# Patient Record
Sex: Female | Born: 1950
Health system: Southern US, Community
[De-identification: ages and names within clinical notes are randomized; demographics above are authoritative.]

## PROBLEM LIST (undated history)

## (undated) DIAGNOSIS — I350 Nonrheumatic aortic (valve) stenosis: Secondary | ICD-10-CM

## (undated) DIAGNOSIS — I10 Essential (primary) hypertension: Secondary | ICD-10-CM

## (undated) DIAGNOSIS — C50919 Malignant neoplasm of unspecified site of unspecified female breast: Secondary | ICD-10-CM

## (undated) DIAGNOSIS — Z9221 Personal history of antineoplastic chemotherapy: Secondary | ICD-10-CM

## (undated) DIAGNOSIS — C801 Malignant (primary) neoplasm, unspecified: Secondary | ICD-10-CM

## (undated) DIAGNOSIS — Z923 Personal history of irradiation: Secondary | ICD-10-CM

## (undated) DIAGNOSIS — T7840XA Allergy, unspecified, initial encounter: Secondary | ICD-10-CM

## (undated) DIAGNOSIS — E785 Hyperlipidemia, unspecified: Secondary | ICD-10-CM

## (undated) HISTORY — DX: Hyperlipidemia, unspecified: E78.5

## (undated) HISTORY — DX: Allergy, unspecified, initial encounter: T78.40XA

## (undated) HISTORY — DX: Essential (primary) hypertension: I10

## (undated) HISTORY — PX: ABDOMINAL HYSTERECTOMY: SHX81

## (undated) HISTORY — DX: Nonrheumatic aortic (valve) stenosis: I35.0

## (undated) HISTORY — DX: Malignant (primary) neoplasm, unspecified: C80.1

## (undated) HISTORY — PX: CARDIAC CATHETERIZATION: SHX172

---

## 1998-04-20 DIAGNOSIS — Z9221 Personal history of antineoplastic chemotherapy: Secondary | ICD-10-CM

## 1998-04-20 DIAGNOSIS — Z923 Personal history of irradiation: Secondary | ICD-10-CM

## 1998-04-20 HISTORY — PX: BREAST LUMPECTOMY: SHX2

## 1998-04-20 HISTORY — DX: Personal history of antineoplastic chemotherapy: Z92.21

## 1998-04-20 HISTORY — DX: Personal history of irradiation: Z92.3

## 2008-04-20 LAB — CONVERTED CEMR LAB: Pap Smear: NORMAL

## 2009-01-27 LAB — HM MAMMOGRAPHY: HM Mammogram: NORMAL

## 2009-05-16 ENCOUNTER — Encounter: Admission: RE | Admit: 2009-05-16 | Discharge: 2009-05-16 | Payer: Self-pay | Admitting: Surgery

## 2009-12-06 ENCOUNTER — Telehealth (INDEPENDENT_AMBULATORY_CARE_PROVIDER_SITE_OTHER): Payer: Self-pay | Admitting: *Deleted

## 2009-12-16 ENCOUNTER — Ambulatory Visit: Payer: Self-pay | Admitting: Internal Medicine

## 2009-12-16 DIAGNOSIS — J309 Allergic rhinitis, unspecified: Secondary | ICD-10-CM | POA: Insufficient documentation

## 2009-12-16 DIAGNOSIS — I10 Essential (primary) hypertension: Secondary | ICD-10-CM | POA: Insufficient documentation

## 2009-12-16 DIAGNOSIS — R748 Abnormal levels of other serum enzymes: Secondary | ICD-10-CM

## 2009-12-16 LAB — CONVERTED CEMR LAB
ALT: 20 units/L (ref 0–35)
AST: 25 units/L (ref 0–37)
Alkaline Phosphatase: 78 units/L (ref 39–117)
BUN: 15 mg/dL (ref 6–23)
Bilirubin, Direct: 0.1 mg/dL (ref 0.0–0.3)
Calcium: 9.7 mg/dL (ref 8.4–10.5)
Cholesterol: 220 mg/dL — ABNORMAL HIGH (ref 0–200)
Creatinine, Ser: 0.7 mg/dL (ref 0.4–1.2)
Direct LDL: 95.1 mg/dL
Eosinophils Relative: 2.1 % (ref 0.0–5.0)
GFR calc non Af Amer: 90.55 mL/min (ref >60)
HDL: 103.6 mg/dL (ref >39.00)
Lymphocytes Relative: 30 % (ref 12.0–46.0)
Monocytes Absolute: 0.5 10*3/uL (ref 0.1–1.0)
Monocytes Relative: 10.2 % (ref 3.0–12.0)
Neutrophils Relative %: 56.9 % (ref 43.0–77.0)
Platelets: 213 10*3/uL (ref 150.0–400.0)
Total Bilirubin: 0.6 mg/dL (ref 0.3–1.2)
Total CHOL/HDL Ratio: 2
VLDL: 10.2 mg/dL (ref 0.0–40.0)
WBC: 5.3 10*3/uL (ref 4.5–10.5)

## 2010-04-17 ENCOUNTER — Ambulatory Visit: Payer: Self-pay | Admitting: Internal Medicine

## 2010-05-12 ENCOUNTER — Other Ambulatory Visit: Payer: Self-pay | Admitting: Internal Medicine

## 2010-05-12 DIAGNOSIS — Z1239 Encounter for other screening for malignant neoplasm of breast: Secondary | ICD-10-CM

## 2010-05-20 NOTE — Assessment & Plan Note (Signed)
Summary: new / bcbs / # cd   Vital Signs:  Patient profile:   60 year old female Menstrual status:  hysterectomy Height:      66 inches Weight:      138.25 pounds BMI:     22.39 O2 Sat:      96 % on Room air Temp:     98.4 degrees F oral Pulse rate:   68 / minute Pulse rhythm:   regular Resp:     16 per minute BP sitting:   102 / 62  (left arm) Cuff size:   large  Vitals Entered By: Rock Nephew CMA (December 16, 2009 10:46 AM)  O2 Flow:  Room air CC: New to establish, Lipid Management Is Patient Diabetic? No Pain Assessment Patient in pain? no       Does patient need assistance? Functional Status Self care Ambulation Normal     Menstrual Status hysterectomy Last PAP Result normal   Primary Care Provider:  Etta Grandchild MD  CC:  New to establish and Lipid Management.  History of Present Illness:  Hypertension Follow-Up      This is a 60 year old woman who presents for Hypertension follow-up.  The patient reports lightheadedness, but denies urinary frequency, headaches, edema, rash, and fatigue.  The patient denies the following associated symptoms: chest pain, chest pressure, exercise intolerance, dyspnea, palpitations, syncope, leg edema, and pedal edema.  Compliance with medications (by patient report) has been near 100%.  The patient reports that dietary compliance has been excellent.  The patient reports exercising 3-4X per week.  Adjunctive measures currently used by the patient include salt restriction and relaxation.    Lipid Management History:      Positive NCEP/ATP III risk factors include female age 61 years old or older and hypertension.  Negative NCEP/ATP III risk factors include no history of early menopause without estrogen hormone replacement, non-diabetic, no family history for ischemic heart disease, non-tobacco-user status, no ASHD (atherosclerotic heart disease), no prior stroke/TIA, no peripheral vascular disease, and no history of aortic  aneurysm.        The patient states that she knows about the "Therapeutic Lifestyle Change" diet.  Her compliance with the TLC diet is good.  The patient expresses understanding of adjunctive measures for cholesterol lowering.  Adjunctive measures started by the patient include aerobic exercise, fiber, ASA, limit alcohol consumpton, and weight reduction.  She expresses no side effects from her lipid-lowering medication.  The patient denies any symptoms to suggest myopathy or liver disease.     Preventive Screening-Counseling & Management  Alcohol-Tobacco     Alcohol drinks/day: <1     Alcohol type: wine     >5/day in last 3 mos: no     Alcohol Counseling: not indicated; use of alcohol is not excessive or problematic     Feels need to cut down: no     Feels annoyed by complaints: no     Feels guilty re: drinking: no     Needs 'eye opener' in am: no     Smoking Status: never     Tobacco Counseling: not indicated; no tobacco use  Caffeine-Diet-Exercise     Does Patient Exercise: no     Exercise Counseling: to improve exercise regimen  Hep-HIV-STD-Contraception     Hepatitis Risk: no risk noted     HIV Risk: no risk noted     STD Risk: no risk noted     Dental Visit-last 6 months yes  SBE monthly: yes     SBE Education/Counseling: to perform regular SBE  Safety-Violence-Falls     Seat Belt Use: no      Sexual History:  not active.        Drug Use:  no.        Blood Transfusions:  no.    Medications Prior to Update: 1)  None  Current Medications (verified): 1)  Allegra 180 Mg Tabs (Fexofenadine Hcl) .... Take 1 Tablet By Mouth Once A Day 2)  Zolpidem Tartrate 5 Mg Tabs (Zolpidem Tartrate) .... Take 1 Tab By Mouth At Bedtime 3)  Simvastatin 80 Mg Tabs (Simvastatin) .... Take 1 Tablet By Mouth Once A Day 4)  Asa 81mg  .... Take 1 Tablet By Mouth Once A Day  Allergies (verified): 1)  ! Penicillin 2)  ! Sulfa  Comments:  Nurse/Medical Assistant: The patient's  medications and allergies were reviewed with the patient and were updated in the Medication and Allergy Lists. Rock Nephew CMA (December 16, 2009 10:49 AM)  Past History:  Past Medical History: Allergic rhinitis Breast cancer, hx of Hyperlipidemia Hypertension  Past Surgical History: Hysterectomy Lumpectomy  Family History: Family History of Arthritis Family History High cholesterol Family History Lung cancer Family History  Prostate Cancer Family History Breast Cancer  Social History: Occupation: Educational psychologist Divorced Never Smoked Alcohol use-yes Drug use-no Regular exercise-no Smoking Status:  never Drug Use:  no Does Patient Exercise:  no EducationRadiographer, therapeutic Use:  no Hepatitis Risk:  no risk noted HIV Risk:  no risk noted STD Risk:  no risk noted Dental Care w/in 6 mos.:  yes Sexual History:  not active Blood Transfusions:  no  Review of Systems  The patient denies anorexia, fever, weight loss, weight gain, hoarseness, chest pain, syncope, dyspnea on exertion, peripheral edema, prolonged cough, headaches, hemoptysis, abdominal pain, hematuria, suspicious skin lesions, difficulty walking, depression, enlarged lymph nodes, angioedema, and breast masses.    Physical Exam  General:  alert, well-developed, well-nourished, well-hydrated, appropriate dress, normal appearance, healthy-appearing, and cooperative to examination.   Head:  normocephalic, atraumatic, no abnormalities observed, and no abnormalities palpated.   Eyes:  vision grossly intact, pupils equal, pupils round, and pupils reactive to light.   Ears:  R ear normal and L ear normal.   Nose:  External nasal examination shows no deformity or inflammation. Nasal mucosa are pink and moist without lesions or exudates. Mouth:  Oral mucosa and oropharynx without lesions or exudates.  Teeth in good repair. Neck:  supple, full ROM, no masses, no thyromegaly, no thyroid nodules or  tenderness, no JVD, normal carotid upstroke, no carotid bruits, no cervical lymphadenopathy, and no neck tenderness.   Lungs:  normal respiratory effort, no intercostal retractions, no accessory muscle use, normal breath sounds, no dullness, no fremitus, no crackles, and no wheezes.   Heart:  normal rate, regular rhythm, no murmur, no gallop, no rub, no JVD, and no HJR.   Abdomen:  soft, non-tender, normal bowel sounds, no distention, no masses, no guarding, no rigidity, no rebound tenderness, no abdominal hernia, no inguinal hernia, no hepatomegaly, and no splenomegaly.   Msk:  normal ROM, no joint tenderness, no joint swelling, no joint warmth, no redness over joints, no joint deformities, no joint instability, and no crepitation.   Pulses:  R and L carotid,radial,femoral,dorsalis pedis and posterior tibial pulses are full and equal bilaterally Extremities:  No clubbing, cyanosis, edema, or deformity noted with normal full range of  motion of all joints.   Neurologic:  No cranial nerve deficits noted. Station and gait are normal. Plantar reflexes are down-going bilaterally. DTRs are symmetrical throughout. Sensory, motor and coordinative functions appear intact. Skin:  turgor normal, color normal, no rashes, no suspicious lesions, no ecchymoses, no petechiae, no purpura, no ulcerations, no edema, excessive tan, and solar damage.   Cervical Nodes:  no anterior cervical adenopathy and no posterior cervical adenopathy.   Axillary Nodes:  no R axillary adenopathy and no L axillary adenopathy.   Inguinal Nodes:  no R inguinal adenopathy and no L inguinal adenopathy.   Psych:  Cognition and judgment appear intact. Alert and cooperative with normal attention span and concentration. No apparent delusions, illusions, hallucinations   Impression & Recommendations:  Problem # 1:  HYPERTENSION (ICD-401.9) Assessment Improved  The following medications were removed from the medication list:    Lisinopril 10  Mg Tabs (Lisinopril) .Marland Kitchen... Take 1 tablet by mouth once a day  Orders: Venipuncture ([address]) TLB-Lipid Panel (80061-LIPID) TLB-BMP (Basic Metabolic Panel-BMET) (80048-METABOL) TLB-CBC Platelet - w/Differential (85025-CBCD) TLB-Hepatic/Liver Function Pnl (80076-HEPATIC) TLB-TSH (Thyroid Stimulating Hormone) (84443-TSH)  BP today: 102/62  10 Yr Risk Heart Disease: Not enough information  Problem # 2:  HYPERLIPIDEMIA (ICD-272.4) Assessment: Unchanged  Her updated medication list for this problem includes:    Simvastatin 80 Mg Tabs (Simvastatin) .Marland Kitchen... Take 1 tablet by mouth once a day  Orders: Venipuncture ([address]) TLB-Lipid Panel (80061-LIPID) TLB-BMP (Basic Metabolic Panel-BMET) (80048-METABOL) TLB-CBC Platelet - w/Differential (85025-CBCD) TLB-Hepatic/Liver Function Pnl (80076-HEPATIC) TLB-TSH (Thyroid Stimulating Hormone) (84443-TSH)  Problem # 3:  BREAST CANCER, HX OF (ICD-V10.3) Assessment: Unchanged  Orders: Radiology Referral (Radiology)  Complete Medication List: 1)  Allegra 180 Mg Tabs (Fexofenadine hcl) .... Take 1 tablet by mouth once a day 2)  Zolpidem Tartrate 5 Mg Tabs (Zolpidem tartrate) .... Take 1 tab by mouth at bedtime 3)  Simvastatin 80 Mg Tabs (Simvastatin) .... Take 1 tablet by mouth once a day 4)  Asa 81mg   .... Take 1 tablet by mouth once a day  Lipid Assessment/Plan:      Based on NCEP/ATP III, the patient's risk factor category is "2 or more risk factors and a calculated 10 year CAD risk of > 20%".  The patient's lipid goals are as follows: Total cholesterol goal is 200; LDL cholesterol goal is 130; HDL cholesterol goal is 40; Triglyceride goal is 150.    Patient Instructions: 1)  Please schedule a follow-up appointment in 4 months. 2)  It is important that you exercise regularly at least 20 minutes 5 times a week. If you develop chest pain, have severe difficulty breathing, or feel very tired , stop exercising immediately and seek medical  attention. 3)  Schedule your mammogram. 4)  Schedule a colonoscopy/sigmoidoscopy to help detect colon cancer. 5)  Check your Blood Pressure regularly. If it is above 140/90: you should make an appointment. Prescriptions: SIMVASTATIN 80 MG TABS (SIMVASTATIN) Take 1 tablet by mouth once a day  #30 x 11   Entered and Authorized by:   Etta Grandchild MD   Signed by:   Etta Grandchild MD on 12/16/2009   Method used:   Electronically to        [facility]       [address]       [place], [place]  [address]       Ph: [phone]  Fax: [phone]   RxID:   [id number] ALLEGRA 180 MG TABS (FEXOFENADINE HCL) Take 1 tablet by mouth once a day  #30 x 11   Entered and Authorized by:   Etta Grandchild MD   Signed by:   Etta Grandchild MD on 12/16/2009   Method used:   Electronically to        [facility]       [address]       [place], [place]  [address]       Ph: [phone]       Fax: [phone]   RxID:   [id number] ZOLPIDEM TARTRATE 5 MG TABS (ZOLPIDEM TARTRATE) Take 1 tab by mouth at bedtime  #30 x 5   Entered and Authorized by:   Etta Grandchild MD   Signed by:   Etta Grandchild MD on 12/16/2009   Method used:   Print then Give to Patient   RxID:   [id number]   Preventive Care Screening  Mammogram:    Date:  04/20/2008    Results:  normal   Pap Smear:    Date:  04/20/2008    Results:  normal

## 2010-05-20 NOTE — Letter (Signed)
Summary: Results Follow-up Letter  [facility]  [address] [place], [place] [address]   Phone: [phone]  Fax: [phone]    12/16/2009  [address] [place], [place]  [address]  Dear Ms. SONNENFELD,   The following are the results of your recent test(s):  Test     Result     Potassium level   slightly elevated (due to lisinopril) Liver/kidney   normal CBC       normal Thyroid     normal   _________________________________________________________  Please call for an appointment in 2-3 weeks for a potassium recheck _________________________________________________________ _________________________________________________________ _________________________________________________________  Sincerely,  Sanda Linger MD [facility]

## 2010-05-20 NOTE — Progress Notes (Signed)
  Phone Note Other Incoming   Request: Send information Summary of Call: Records received from [facility]. 28 pages forwarded to Dr. Yetta Barre for review.

## 2010-05-20 NOTE — Letter (Signed)
Summary: Lipid Letter  [facility]  [address] [place], [place] [address]   Phone: [phone]  Fax: [phone]    12/16/2009  Glenn Christo [address] [place], [place]  [address]  Dear Ms. Vanvorst:  We have carefully reviewed your last lipid profile from  and the results are noted below with a summary of recommendations for lipid management.    Cholesterol:       220     Goal: <200   HDL "good" Cholesterol:   578.46     Goal: >40   LDL "bad" Cholesterol:   95     Goal: <130   Triglycerides:       51.0     Goal: <150        TLC Diet (Therapeutic Lifestyle Change): Saturated Fats & Transfatty acids should be kept < 7% of total calories ***Reduce Saturated Fats Polyunstaurated Fat can be up to 10% of total calories Monounsaturated Fat Fat can be up to 20% of total calories Total Fat should be no greater than 25-35% of total calories Carbohydrates should be 50-60% of total calories Protein should be approximately 15% of total calories Fiber should be at least 20-30 grams a day ***Increased fiber may help lower LDL Total Cholesterol should be < 200mg /day Consider adding plant stanol/sterols to diet (example: Benacol spread) ***A higher intake of unsaturated fat may reduce Triglycerides and Increase HDL    Adjunctive Measures (may lower LIPIDS and reduce risk of Heart Attack) include: Aerobic Exercise (20-30 minutes 3-4 times a week) Limit Alcohol Consumption Weight Reduction Aspirin 75-81 mg a day by mouth (if not allergic or contraindicated) Dietary Fiber 20-30 grams a day by mouth     Current Medications: 1)    Allegra 180 Mg Tabs (Fexofenadine hcl) .... Take 1 tablet by mouth once a day 2)    Zolpidem Tartrate 5 Mg Tabs (Zolpidem tartrate) .... Take 1 tab by mouth at bedtime 3)    Simvastatin 80 Mg Tabs (Simvastatin) .... Take 1 tablet by mouth once a day 4)    Asa 81mg   .... Take 1 tablet by mouth once a day  If you have any  questions, please call. We appreciate being able to work with you.   Sincerely,    [facility] Etta Grandchild MD

## 2010-05-24 ENCOUNTER — Encounter: Payer: Self-pay | Admitting: Internal Medicine

## 2010-06-03 ENCOUNTER — Ambulatory Visit
Admission: RE | Admit: 2010-06-03 | Discharge: 2010-06-03 | Disposition: A | Discharge status: Home / Self Care | Payer: BC Managed Care – PPO | Source: Ambulatory Visit | Attending: Internal Medicine | Admitting: Internal Medicine

## 2010-06-03 DIAGNOSIS — Z1239 Encounter for other screening for malignant neoplasm of breast: Secondary | ICD-10-CM

## 2010-08-27 ENCOUNTER — Encounter: Payer: Self-pay | Admitting: Internal Medicine

## 2010-08-27 ENCOUNTER — Ambulatory Visit (INDEPENDENT_AMBULATORY_CARE_PROVIDER_SITE_OTHER): Payer: BC Managed Care – PPO | Admitting: Internal Medicine

## 2010-08-27 VITALS — BP 112/80 | HR 64 | Temp 97.4°F | Resp 16 | Ht 66.0 in | Wt 143.0 lb

## 2010-08-27 DIAGNOSIS — J019 Acute sinusitis, unspecified: Secondary | ICD-10-CM

## 2010-08-27 MED ORDER — PSEUDOEPH-CHLORPHEN-HYDROCOD 60-4-5 MG/5ML PO SOLN: 5.0000 mL | Freq: Four times a day (QID) | ORAL | Status: DC | PRN

## 2010-08-27 MED ORDER — MOXIFLOXACIN HCL 400 MG PO TABS: 400.0000 mg | ORAL_TABLET | Freq: Every day | ORAL | Status: AC

## 2010-08-27 NOTE — Patient Instructions (Signed)

## 2010-08-28 ENCOUNTER — Encounter: Payer: Self-pay | Admitting: Internal Medicine

## 2010-08-28 NOTE — Progress Notes (Signed)
  Subjective:    Patient ID: Kathryn Chavez, female    DOB: [birth year], 60 y.o.   MRN: [id number]  URI  This is a new problem. The current episode started 1 to 4 weeks ago. The problem has been gradually worsening. There has been no fever. Associated symptoms include congestion, coughing (productive of yellow phlegm), rhinorrhea, sinus pain, sneezing and a sore throat. Pertinent negatives include no abdominal pain, chest pain, diarrhea, dysuria, ear pain, headaches, joint pain, joint swelling, nausea, neck pain, plugged ear sensation, rash, swollen glands, vomiting or wheezing. She has tried antihistamine and decongestant for the symptoms. The treatment provided mild relief.      Review of Systems  Constitutional: Negative for fever, chills, diaphoresis, activity change, appetite change, fatigue and unexpected weight change.  HENT: Positive for congestion, sore throat, rhinorrhea, sneezing and sinus pressure. Negative for hearing loss, ear pain, nosebleeds, facial swelling, trouble swallowing, neck pain, neck stiffness, voice change and tinnitus.   Respiratory: Positive for cough (productive of yellow phlegm). Negative for apnea, choking, chest tightness, shortness of breath, wheezing and stridor.   Cardiovascular: Negative for chest pain, palpitations and leg swelling.  Gastrointestinal: Negative for nausea, vomiting, abdominal pain, diarrhea, constipation, abdominal distention and anal bleeding.  Genitourinary: Negative for dysuria, urgency, frequency, hematuria, decreased urine volume, enuresis, difficulty urinating and dyspareunia.  Musculoskeletal: Negative for myalgias, back pain, joint pain, joint swelling, arthralgias and gait problem.  Skin: Negative for color change, pallor and rash.  Neurological: Negative for dizziness, tremors, seizures, syncope, facial asymmetry, speech difficulty, weakness, light-headedness, numbness and headaches.  Hematological: Negative for adenopathy. Does not  bruise/bleed easily.  Psychiatric/Behavioral: Negative for hallucinations, behavioral problems, confusion, self-injury, dysphoric mood, decreased concentration and agitation. The patient is not nervous/anxious and is not hyperactive.        Objective:   Physical Exam  Vitals reviewed. Constitutional: She is oriented to person, place, and time. She appears well-developed and well-nourished. No distress.  HENT:  Head: Normocephalic and atraumatic.  Right Ear: External ear normal.  Left Ear: External ear normal.  Nose: Nose normal.  Mouth/Throat: Oropharynx is clear and moist. No oropharyngeal exudate.  Eyes: Conjunctivae and EOM are normal. Pupils are equal, round, and reactive to light. Right eye exhibits no discharge. Left eye exhibits no discharge. No scleral icterus.  Neck: Normal range of motion. Neck supple. No JVD present. No tracheal deviation present. No thyromegaly present.  Cardiovascular: Normal rate, regular rhythm, normal heart sounds and intact distal pulses.  Exam reveals no gallop and no friction rub.   No murmur heard. Pulmonary/Chest: Effort normal and breath sounds normal. No stridor. No respiratory distress. She has no wheezes. She has no rales. She exhibits no tenderness.  Abdominal: Soft. Bowel sounds are normal. She exhibits no distension and no mass. There is no tenderness. There is no rebound and no guarding.  Musculoskeletal: Normal range of motion. She exhibits no tenderness.  Lymphadenopathy:    She has no cervical adenopathy.  Neurological: She is alert and oriented to person, place, and time. She has normal reflexes. She displays normal reflexes. No cranial nerve deficit. She exhibits normal muscle tone. Coordination normal.  Skin: Skin is warm and dry. No rash noted. She is not diaphoretic. No erythema. No pallor.  Psychiatric: She has a normal mood and affect. Her behavior is normal. Judgment and thought content normal.          Assessment & Plan:

## 2010-08-28 NOTE — Assessment & Plan Note (Signed)
Start avelox for the infection and zutripro for the symptoms

## 2010-10-27 ENCOUNTER — Encounter: Payer: Self-pay | Admitting: Internal Medicine

## 2010-10-28 ENCOUNTER — Encounter: Payer: Self-pay | Admitting: Internal Medicine

## 2010-10-28 ENCOUNTER — Ambulatory Visit (INDEPENDENT_AMBULATORY_CARE_PROVIDER_SITE_OTHER): Payer: BC Managed Care – PPO | Admitting: Internal Medicine

## 2010-10-28 ENCOUNTER — Other Ambulatory Visit (INDEPENDENT_AMBULATORY_CARE_PROVIDER_SITE_OTHER): Payer: BC Managed Care – PPO

## 2010-10-28 ENCOUNTER — Other Ambulatory Visit: Payer: Self-pay | Admitting: Internal Medicine

## 2010-10-28 DIAGNOSIS — M839 Adult osteomalacia, unspecified: Secondary | ICD-10-CM

## 2010-10-28 DIAGNOSIS — E785 Hyperlipidemia, unspecified: Secondary | ICD-10-CM

## 2010-10-28 DIAGNOSIS — Z23 Encounter for immunization: Secondary | ICD-10-CM

## 2010-10-28 DIAGNOSIS — I10 Essential (primary) hypertension: Secondary | ICD-10-CM

## 2010-10-28 LAB — CBC WITH DIFFERENTIAL/PLATELET
Basophils Absolute: 0 10*3/uL (ref 0.0–0.1)
Eosinophils Relative: 2.4 % (ref 0.0–5.0)
Lymphs Abs: 1.7 10*3/uL (ref 0.7–4.0)
MCV: 94.1 fl (ref 78.0–100.0)
Monocytes Relative: 9.3 % (ref 3.0–12.0)
Neutro Abs: 3 10*3/uL (ref 1.4–7.7)
Neutrophils Relative %: 55.9 % (ref 43.0–77.0)
Platelets: 223 10*3/uL (ref 150.0–400.0)
WBC: 5.4 10*3/uL (ref 4.5–10.5)

## 2010-10-28 LAB — COMPREHENSIVE METABOLIC PANEL
AST: 28 U/L (ref 0–37)
Albumin: 4.7 g/dL (ref 3.5–5.2)
BUN: 13 mg/dL (ref 6–23)
Chloride: 108 mEq/L (ref 96–112)
Potassium: 4 mEq/L (ref 3.5–5.1)
Sodium: 143 mEq/L (ref 135–145)
Total Protein: 7 g/dL (ref 6.0–8.3)

## 2010-10-28 LAB — LDL CHOLESTEROL, DIRECT: Direct LDL: 87 mg/dL

## 2010-10-28 LAB — TSH: TSH: 2.7 u[IU]/mL (ref 0.35–5.50)

## 2010-10-28 LAB — LIPID PANEL: Triglycerides: 44 mg/dL (ref 0.0–149.0)

## 2010-10-28 NOTE — Patient Instructions (Signed)

## 2010-10-28 NOTE — Progress Notes (Signed)
  Subjective:    Patient ID: Kathryn Chavez, female    DOB: 1950/11/08, 60 y.o.   MRN: [id number]  Hyperlipidemia This is a chronic problem. The current episode started more than 1 year ago. The problem is controlled. Recent lipid tests were reviewed and are normal. She has no history of chronic renal disease, diabetes, hypothyroidism, liver disease, obesity or nephrotic syndrome. Factors aggravating her hyperlipidemia include no known factors. Pertinent negatives include no chest pain, focal sensory loss, focal weakness, leg pain, myalgias or shortness of breath. Current antihyperlipidemic treatment includes statins. The current treatment provides significant improvement of lipids. There are no compliance problems.       Review of Systems  Constitutional: Negative.   HENT: Negative.   Eyes: Negative.   Respiratory: Negative.  Negative for shortness of breath.   Cardiovascular: Negative.  Negative for chest pain.  Gastrointestinal: Negative.   Genitourinary: Negative.  Negative for difficulty urinating.  Musculoskeletal: Negative.  Negative for myalgias.  Skin: Negative.   Neurological: Negative.  Negative for focal weakness.  Hematological: Negative.   Psychiatric/Behavioral: Negative.        Objective:   Physical Exam  Vitals reviewed. Constitutional: She is oriented to person, place, and time. She appears well-developed and well-nourished. No distress.  HENT:  Head: Normocephalic and atraumatic.  Right Ear: External ear normal.  Left Ear: External ear normal.  Nose: Nose normal.  Mouth/Throat: Oropharynx is clear and moist. No oropharyngeal exudate.  Eyes: Conjunctivae and EOM are normal. Pupils are equal, round, and reactive to light. Right eye exhibits no discharge. Left eye exhibits no discharge. No scleral icterus.  Neck: Normal range of motion. Neck supple. No JVD present. No tracheal deviation present. No thyromegaly present.  Cardiovascular: Normal rate, regular rhythm,  normal heart sounds and intact distal pulses.  Exam reveals no gallop and no friction rub.   No murmur heard. Pulmonary/Chest: Effort normal and breath sounds normal. No stridor. No respiratory distress. She has no wheezes. She has no rales. She exhibits no tenderness.  Abdominal: Soft. Bowel sounds are normal. She exhibits no distension and no mass. There is no tenderness. There is no rebound and no guarding.  Musculoskeletal: Normal range of motion. She exhibits no edema and no tenderness.  Lymphadenopathy:    She has no cervical adenopathy.  Neurological: She is alert and oriented to person, place, and time. She has normal reflexes. She displays normal reflexes. No cranial nerve deficit. She exhibits normal muscle tone. Coordination normal.  Skin: Skin is warm and dry. No rash noted. She is not diaphoretic. No erythema. No pallor.  Psychiatric: She has a normal mood and affect. Her behavior is normal. Judgment and thought content normal.        Lab Results  Component Value Date   WBC 5.3 12/16/2009   HGB 13.0 12/16/2009   HCT 37.3 12/16/2009   PLT 213.0 12/16/2009   CHOL 220* 12/16/2009   TRIG 51.0 12/16/2009   HDL 103.60 12/16/2009   LDLDIRECT 95.1 12/16/2009   ALT 20 12/16/2009   AST 25 12/16/2009   NA 142 12/16/2009   K 5.3* 12/16/2009   CL 103 12/16/2009   CREATININE 0.7 12/16/2009   BUN 15 12/16/2009   CO2 28 12/16/2009   TSH 1.51 12/16/2009    Assessment & Plan:

## 2010-10-29 ENCOUNTER — Encounter: Payer: Self-pay | Admitting: Internal Medicine

## 2010-10-29 NOTE — Assessment & Plan Note (Signed)
Check labs and continue simvastatin

## 2010-10-29 NOTE — Assessment & Plan Note (Signed)
This has resolved.

## 2010-10-29 NOTE — Assessment & Plan Note (Signed)
Ay her request I will check her vitamin d level today

## 2010-10-31 LAB — VITAMIN D 1,25 DIHYDROXY
Vitamin D 1, 25 (OH)2 Total: 31 pg/mL (ref 18–72)
Vitamin D3 1, 25 (OH)2: 31 pg/mL

## 2010-12-26 ENCOUNTER — Other Ambulatory Visit: Payer: Self-pay | Admitting: Internal Medicine

## 2010-12-31 ENCOUNTER — Ambulatory Visit (INDEPENDENT_AMBULATORY_CARE_PROVIDER_SITE_OTHER): Payer: BC Managed Care – PPO | Admitting: Internal Medicine

## 2010-12-31 ENCOUNTER — Encounter: Payer: Self-pay | Admitting: Internal Medicine

## 2010-12-31 VITALS — BP 122/76 | HR 71 | Temp 98.4°F | Resp 16 | Wt 139.0 lb

## 2010-12-31 DIAGNOSIS — J209 Acute bronchitis, unspecified: Secondary | ICD-10-CM

## 2010-12-31 MED ORDER — HYDROCOD POLST-CPM POLST ER 10-8 MG PO CP12: 1.0000 | ORAL_CAPSULE | Freq: Two times a day (BID) | ORAL | Status: DC | PRN

## 2010-12-31 MED ORDER — MOXIFLOXACIN HCL 400 MG PO TABS: 400.0000 mg | ORAL_TABLET | Freq: Every day | ORAL | Status: AC

## 2010-12-31 NOTE — Patient Instructions (Signed)
Acute Bronchitis You have acute bronchitis. This means you have a chest cold. The airways in your lungs are inflamed (red and sore). Acute means it is sudden onset. Bronchitis is most often caused by a virus. In smokers, people with chronic lung problems, and elderly patients, treatment with antibiotics for bacterial infection may be needed. Exposure to cigarette smoke or irritating chemicals will make bronchitis worse. Allergies and asthma can also make bronchitis worse. Repeated episodes of bronchitis may cause long standing lung problems. Acute bronchitis is usually treated with rest, fluids, and medicines for relief of fever or cough. Bronchodilator medicines from metered inhalers or a nebulizer may be used to help open up the small airways. This reduces shortness of breath and helps control cough. Antibiotics can be prescribed if you are more seriously ill or at risk. A cool air vaporizer may help thin bronchial secretions and make it easier to clear your chest. Increased fluids may also help. You must avoid smoking, even second hand exposure. If you are a cigarette smoker, consider using nicotine gum or skin patches to help control withdrawal symptoms. Recovery from bronchitis is often slow, but you should start feeling better after 2-3 days. Cough from bronchitis frequently lasts for 3-4 weeks.  SEEK IMMEDIATE MEDICAL CARE IF YOU DEVELOP:  Increased fever, chills, or chest pain.   Severe shortness of breath or bloody sputum.   Dehydration, fainting, repeated vomiting, severe headache.   No improvement after one week of proper treatment.  MAKE SURE YOU:   Understand these instructions.   Will watch your condition.   Will get help right away if you are not doing well or get worse.  Document Released: 05/14/2004 Document Re-Released: 03/19/2008 [employer] Patient Information 2011 [employer]. 

## 2010-12-31 NOTE — Progress Notes (Signed)
  Subjective:    Patient ID: Kathryn Chavez, female    DOB: 03-31-1951, 60 y.o.   MRN: [id number]  URI  This is a new problem. The current episode started in the past 7 days. The problem has been gradually worsening. There has been no fever. Associated symptoms include congestion, coughing (productive of thick/yellow-green phlegm), rhinorrhea, sinus pain and a sore throat. Pertinent negatives include no abdominal pain, chest pain, diarrhea, dysuria, ear pain, headaches, joint pain, joint swelling, nausea, neck pain, plugged ear sensation, rash, sneezing, swollen glands, vomiting or wheezing. She has tried nothing for the symptoms.      Review of Systems  Constitutional: Negative.   HENT: Positive for congestion, sore throat, rhinorrhea, postnasal drip and sinus pressure. Negative for hearing loss, ear pain, facial swelling, sneezing, drooling, mouth sores, trouble swallowing, neck pain, dental problem, voice change, tinnitus and ear discharge.   Eyes: Negative for photophobia, pain, discharge, redness, itching and visual disturbance.  Respiratory: Positive for cough (productive of thick/yellow-green phlegm). Negative for apnea, choking, chest tightness, wheezing and stridor.   Cardiovascular: Negative for chest pain, palpitations and leg swelling.  Gastrointestinal: Negative for nausea, vomiting, abdominal pain and diarrhea.  Genitourinary: Negative for dysuria, frequency, hematuria, enuresis, difficulty urinating and dyspareunia.  Musculoskeletal: Negative.  Negative for joint pain.  Skin: Negative for color change, pallor, rash and wound.  Neurological: Negative.  Negative for headaches.  Hematological: Negative for adenopathy. Does not bruise/bleed easily.  Psychiatric/Behavioral: Negative.        Objective:   Physical Exam  Vitals reviewed. Constitutional: She is oriented to person, place, and time. She appears well-developed and well-nourished. No distress.  HENT:  Mouth/Throat:  Oropharynx is clear and moist. No oropharyngeal exudate.  Eyes: Conjunctivae are normal. Right eye exhibits no discharge. Left eye exhibits no discharge. No scleral icterus.  Neck: Normal range of motion. Neck supple. No JVD present. No tracheal deviation present. No thyromegaly present.  Cardiovascular: Normal rate, regular rhythm, normal heart sounds and intact distal pulses.  Exam reveals no gallop and no friction rub.   No murmur heard. Pulmonary/Chest: Effort normal and breath sounds normal. No stridor. No respiratory distress. She has no wheezes. She has no rales. She exhibits no tenderness.  Abdominal: Soft. Bowel sounds are normal. She exhibits no distension and no mass. There is no tenderness. There is no rebound and no guarding.  Musculoskeletal: Normal range of motion. She exhibits no edema and no tenderness.  Lymphadenopathy:    She has no cervical adenopathy.  Neurological: She is oriented to person, place, and time. She displays normal reflexes. She exhibits normal muscle tone.  Skin: Skin is warm and dry. No rash noted. She is not diaphoretic. No erythema. No pallor.  Psychiatric: She has a normal mood and affect. Her behavior is normal. Judgment and thought content normal.          Assessment & Plan:

## 2010-12-31 NOTE — Assessment & Plan Note (Signed)
Treat with avelox and tussicaps

## 2011-01-05 ENCOUNTER — Telehealth: Payer: Self-pay

## 2011-01-05 DIAGNOSIS — J209 Acute bronchitis, unspecified: Secondary | ICD-10-CM

## 2011-01-05 MED ORDER — HYDROCOD POLST-CPM POLST ER 10-8 MG PO CP12: 1.0000 | ORAL_CAPSULE | Freq: Two times a day (BID) | ORAL | Status: DC | PRN

## 2011-01-05 NOTE — Telephone Encounter (Signed)
Patient notified to pick up rx and coupon (placed upfront)//LMOVM for her to pick up

## 2011-01-05 NOTE — Telephone Encounter (Signed)
She probably only needs the cough med, if she thinks she still has infection then she should come back for a f/up

## 2011-01-05 NOTE — Telephone Encounter (Signed)
Patient called Kathryn Chavez stating that she was seen 12/31/10 for URI and treated with avelox and hydrocod-polst. She was advised to call to back if another round is needed. Please advise if ok to refill, also samples of avelox were given but I did not see any more samples. Thanks

## 2011-01-19 ENCOUNTER — Telehealth: Payer: Self-pay | Admitting: *Deleted

## 2011-01-19 MED ORDER — FLUCONAZOLE 150 MG PO TABS: 150.0000 mg | ORAL_TABLET | Freq: Once | ORAL | Status: AC

## 2011-01-19 NOTE — Telephone Encounter (Signed)
done

## 2011-01-19 NOTE — Telephone Encounter (Signed)
Pt was given an antibiotic last week for sinus infection. Antibiotic has given her a yeast infection and she states the only med that works for her is diflucan. Please Advise thank you

## 2011-01-19 NOTE — Telephone Encounter (Signed)
Pt informed

## 2011-01-21 ENCOUNTER — Ambulatory Visit: Payer: BC Managed Care – PPO | Admitting: Internal Medicine

## 2011-02-09 DIAGNOSIS — N8110 Cystocele, unspecified: Secondary | ICD-10-CM | POA: Insufficient documentation

## 2011-02-26 ENCOUNTER — Encounter: Payer: Self-pay | Admitting: Obstetrics & Gynecology

## 2011-02-26 ENCOUNTER — Ambulatory Visit (INDEPENDENT_AMBULATORY_CARE_PROVIDER_SITE_OTHER): Payer: BC Managed Care – PPO | Admitting: Obstetrics & Gynecology

## 2011-02-26 VITALS — BP 129/86 | HR 80 | Ht 66.0 in | Wt 136.0 lb

## 2011-02-26 DIAGNOSIS — IMO0002 Reserved for concepts with insufficient information to code with codable children: Secondary | ICD-10-CM

## 2011-02-26 DIAGNOSIS — Z01419 Encounter for gynecological examination (general) (routine) without abnormal findings: Secondary | ICD-10-CM

## 2011-02-26 DIAGNOSIS — N8111 Cystocele, midline: Secondary | ICD-10-CM

## 2011-02-26 DIAGNOSIS — Z23 Encounter for immunization: Secondary | ICD-10-CM

## 2011-02-26 DIAGNOSIS — Z0000 Encounter for general adult medical examination without abnormal findings: Secondary | ICD-10-CM

## 2011-02-26 MED ORDER — ESTRADIOL 10 MCG VA TABS: ORAL_TABLET | VAGINAL | Status: DC

## 2011-02-26 NOTE — Progress Notes (Signed)
Subjective:    Kathryn Chavez is a 60 y.o. female who presents for annual exam. She complains of a symptomatic cystocele since her vag hyst 3 years ago in [place]. The patient is not sexually active. GYN screening history: last pap: was normal. The patient is not taking hormone replacement therapy. Patient denies post-menopausal vaginal bleeding.. The patient wears seatbelts: yes. The patient participates in regular exercise: no. Has the patient ever been transfused or tattooed?: no. The patient reports that there is not domestic violence in her life.   Menstrual History: OB History    Grav Para Term Preterm Abortions TAB SAB Ect Mult Living                  Menarche age: 67 No LMP recorded. Patient has had a hysterectomy.    The following portions of the patient's history were reviewed and updated as appropriate: allergies, current medications, past family history, past medical history, past social history, past surgical history and problem list.  Review of Systems A comprehensive review of systems was negative.   She was widowed about 3 years ago.  She thinks she will become sexually active soon and would like some vaginal estrogen. She says her mammogram is UTD and primary care MD schedules it. She would like a flu shot today. Objective:    BP 129/86  Pulse 80  Ht 5\' 6"  (1.676 m)  Wt 61.689 kg (136 lb)  BMI 21.95 kg/m2  General Appearance:    Alert, cooperative, no distress, appears stated age  Head:    Normocephalic, without obvious abnormality, atraumatic  Eyes:    PERRL, conjunctiva/corneas clear, EOM's intact, fundi    benign, both eyes  Ears:    Normal TM's and external ear canals, both ears  Nose:   Nares normal, septum midline, mucosa normal, no drainage    or sinus tenderness  Throat:   Lips, mucosa, and tongue normal; teeth and gums normal  Neck:   Supple, symmetrical, trachea midline, no adenopathy;    thyroid:  no enlargement/tenderness/nodules; no carotid  bruit or JVD  Back:     Symmetric, no curvature, ROM normal, no CVA tenderness  Lungs:     Clear to auscultation bilaterally, respirations unlabored  Chest Wall:    No tenderness or deformity   Heart:    Regular rate and rhythm, S1 and S2 normal, no murmur, rub   or gallop  Breast Exam:    No tenderness, masses, or nipple abnormality  Abdomen:     Soft, non-tender, bowel sounds active all four quadrants,    no masses, no organomegaly  Genitalia:    Normal female without lesion, discharge or tenderness, moderate atrophy, 3 cystocele with Valsalva, no pelvic masses, I think she would use a #3-5 ring pessary     Extremities:   Extremities normal, atraumatic, no cyanosis or edema  Pulses:   2+ and symmetric all extremities  Skin:    Skin color, texture, turgor normal, no rashes or lesions  Lymph nodes:   Cervical, supraclavicular, and axillary nodes normal  Neurologic:   CNII-XII intact, normal strength, sensation and reflexes    throughout      Assessment:    Normal gyn exam  cystocele   Plan:    She will start vagifem 10 mcg 2 times per week  She will come back for a pessary fitting and get a flu shot today.

## 2011-03-26 ENCOUNTER — Encounter: Payer: Self-pay | Admitting: Obstetrics & Gynecology

## 2011-03-26 ENCOUNTER — Ambulatory Visit (INDEPENDENT_AMBULATORY_CARE_PROVIDER_SITE_OTHER): Payer: BC Managed Care – PPO | Admitting: Obstetrics & Gynecology

## 2011-03-26 VITALS — BP 135/85 | HR 72 | Ht 66.0 in | Wt 134.0 lb

## 2011-03-26 DIAGNOSIS — IMO0002 Reserved for concepts with insufficient information to code with codable children: Secondary | ICD-10-CM

## 2011-03-26 DIAGNOSIS — N8111 Cystocele, midline: Secondary | ICD-10-CM

## 2011-03-26 NOTE — Progress Notes (Signed)
  Subjective:    Patient ID: Kathryn Chavez, female    DOB: 08/09/1950, 60 y.o.   MRN: [id number]  HPI  Kathryn Chavez is here for a pessary fitting for her symptomatic cystocele. She has been using the Vagifem 2 times per week at night. She is also now sexually active.   Review of Systems     Objective:   Physical Exam  A #2 ring with diaphragm pessary was perfect. She was able to demonstrate removal and insertion.      Assessment & Plan:  Cystocele - doing well with pessary. F/U in 52month/prn sooner,

## 2011-04-23 ENCOUNTER — Ambulatory Visit: Payer: BC Managed Care – PPO | Admitting: Obstetrics & Gynecology

## 2011-04-30 ENCOUNTER — Encounter: Payer: Self-pay | Admitting: Internal Medicine

## 2011-04-30 ENCOUNTER — Other Ambulatory Visit (INDEPENDENT_AMBULATORY_CARE_PROVIDER_SITE_OTHER): Payer: BC Managed Care – PPO

## 2011-04-30 ENCOUNTER — Other Ambulatory Visit: Payer: Self-pay | Admitting: Internal Medicine

## 2011-04-30 ENCOUNTER — Ambulatory Visit (INDEPENDENT_AMBULATORY_CARE_PROVIDER_SITE_OTHER): Payer: BC Managed Care – PPO | Admitting: Internal Medicine

## 2011-04-30 VITALS — BP 116/78 | HR 62 | Temp 98.5°F | Resp 20 | Wt 129.0 lb

## 2011-04-30 DIAGNOSIS — J309 Allergic rhinitis, unspecified: Secondary | ICD-10-CM

## 2011-04-30 DIAGNOSIS — M839 Adult osteomalacia, unspecified: Secondary | ICD-10-CM

## 2011-04-30 DIAGNOSIS — I10 Essential (primary) hypertension: Secondary | ICD-10-CM

## 2011-04-30 DIAGNOSIS — Z1231 Encounter for screening mammogram for malignant neoplasm of breast: Secondary | ICD-10-CM

## 2011-04-30 DIAGNOSIS — E785 Hyperlipidemia, unspecified: Secondary | ICD-10-CM

## 2011-04-30 LAB — COMPREHENSIVE METABOLIC PANEL
Albumin: 4.5 g/dL (ref 3.5–5.2)
Alkaline Phosphatase: 92 U/L (ref 39–117)
BUN: 12 mg/dL (ref 6–23)
Creatinine, Ser: 0.7 mg/dL (ref 0.4–1.2)
Glucose, Bld: 78 mg/dL (ref 70–99)
Potassium: 4.7 mEq/L (ref 3.5–5.1)
Total Bilirubin: 0.7 mg/dL (ref 0.3–1.2)

## 2011-04-30 LAB — CBC WITH DIFFERENTIAL/PLATELET
Basophils Relative: 0.5 % (ref 0.0–3.0)
Eosinophils Absolute: 0.2 10*3/uL (ref 0.0–0.7)
Hemoglobin: 13.9 g/dL (ref 12.0–15.0)
MCHC: 35 g/dL (ref 30.0–36.0)
MCV: 94 fl (ref 78.0–100.0)
Monocytes Absolute: 0.4 10*3/uL (ref 0.1–1.0)
Neutro Abs: 4.1 10*3/uL (ref 1.4–7.7)
RBC: 4.22 Mil/uL (ref 3.87–5.11)
RDW: 12.9 % (ref 11.5–14.6)

## 2011-04-30 LAB — LIPID PANEL
Total CHOL/HDL Ratio: 2
VLDL: 11.2 mg/dL (ref 0.0–40.0)

## 2011-04-30 MED ORDER — MOMETASONE FUROATE 50 MCG/ACT NA SUSP: 2.0000 | Freq: Every day | NASAL | Status: DC

## 2011-04-30 NOTE — Patient Instructions (Signed)
Hypercholesterolemia High Blood Cholesterol Cholesterol is a white, waxy, fat-like protein needed by your body in small amounts. The liver makes all the cholesterol you need. It is carried from the liver by the blood through the blood vessels. Deposits (plaque) may build up on blood vessel walls. This makes the arteries narrower and stiffer. Plaque increases the risk for heart attack and stroke. You cannot feel your cholesterol level even if it is very high. The only way to know is by a blood test to check your lipid (fats) levels. Once you know your cholesterol levels, you should keep a record of the test results. Work with your caregiver to to keep your levels in the desired range. WHAT THE RESULTS MEAN:  Total cholesterol is a rough measure of all the cholesterol in your blood.   LDL is the so-called bad cholesterol. This is the type that deposits cholesterol in the walls of the arteries. You want this level to be low.   HDL is the good cholesterol because it cleans the arteries and carries the LDL away. You want this level to be high.   Triglycerides are fat that the body can either burn for energy or store. High levels are closely linked to heart disease.  DESIRED LEVELS:  Total cholesterol below 200.   LDL below 100 for people at risk, below 70 for very high risk.   HDL above 50 is good, above 60 is best.   Triglycerides below 150.  HOW TO LOWER YOUR CHOLESTEROL:  Diet.   Choose fish or white meat chicken and [place], roasted or baked. Limit fatty cuts of red meat, fried foods, and processed meats, such as sausage and lunch meat.   Eat lots of fresh fruits and vegetables. Choose whole grains, beans, pasta, potatoes and cereals.   Use only small amounts of olive, corn or canola oils. Avoid butter, mayonnaise, shortening or palm kernel oils. Avoid foods with trans-fats.   Use skim/nonfat milk and low-fat/nonfat yogurt and cheeses. Avoid whole milk, cream, ice cream, egg yolks and  cheeses. Healthy desserts include angel food cake, gingersnaps, animal crackers, hard candy, popsicles, and low-fat/nonfat frozen yogurt. Avoid pastries, cakes, pies and cookies.   Exercise.   A regular program helps decrease LDL and raises HDL.   Helps with weight control.   Do things that increase your activity level like gardening, walking, or taking the stairs.   Medication.   May be prescribed by your caregiver to help lowering cholesterol and the risk for heart disease.   You may need medicine even if your levels are normal if you have several risk factors.  HOME CARE INSTRUCTIONS   Follow your diet and exercise programs as suggested by your caregiver.   Take medications as directed.   Have blood work done when your caregiver feels it is necessary.  MAKE SURE YOU:   Understand these instructions.   Will watch your condition.   Will get help right away if you are not doing well or get worse.  Document Released: 04/06/2005 Document Revised: 12/17/2010 Document Reviewed: 09/22/2006 [facility] Patient Information 2012 [place], [place].Allergic Rhinitis Allergic rhinitis is when the mucous membranes in the nose respond to allergens. Allergens are particles in the air that cause your body to have an allergic reaction. This causes you to release allergic antibodies. Through a chain of events, these eventually cause you to release histamine into the blood stream (hence the use of antihistamines). Although meant to be protective to the body, it is this  release that causes your discomfort, such as frequent sneezing, congestion and an itchy runny nose.  CAUSES  The pollen allergens may come from grasses, trees, and weeds. This is seasonal allergic rhinitis, or "hay fever." Other allergens cause year-round allergic rhinitis (perennial allergic rhinitis) such as house dust mite allergen, pet dander and mold spores.  SYMPTOMS   Nasal stuffiness (congestion).   Runny, itchy nose with  sneezing and tearing of the eyes.   There is often an itching of the mouth, eyes and ears.  It cannot be cured, but it can be controlled with medications. DIAGNOSIS  If you are unable to determine the offending allergen, skin or blood testing may find it. TREATMENT   Avoid the allergen.   Medications and allergy shots (immunotherapy) can help.   Hay fever may often be treated with antihistamines in pill or nasal spray forms. Antihistamines block the effects of histamine. There are over-the-counter medicines that may help with nasal congestion and swelling around the eyes. Check with your caregiver before taking or giving this medicine.  If the treatment above does not work, there are many new medications your caregiver can prescribe. Stronger medications may be used if initial measures are ineffective. Desensitizing injections can be used if medications and avoidance fails. Desensitization is when a patient is given ongoing shots until the body becomes less sensitive to the allergen. Make sure you follow up with your caregiver if problems continue. SEEK MEDICAL CARE IF:   You develop fever (more than 100.5 F (38.1 C).   You develop a cough that does not stop easily (persistent).   You have shortness of breath.   You start wheezing.   Symptoms interfere with normal daily activities.  Document Released: 12/30/2000 Document Revised: 12/17/2010 Document Reviewed: 07/11/2008 [facility] Patient Information 2012 [place], [place].

## 2011-04-30 NOTE — Progress Notes (Signed)
Subjective:    Patient ID: Kathryn Chavez, female    DOB: [birth year], 61 y.o.   MRN: [id number]  Hypertension This is a chronic problem. The current episode started more than 1 year ago. The problem has been gradually improving since onset. The problem is controlled. Pertinent negatives include no anxiety, blurred vision, chest pain, headaches, malaise/fatigue, neck pain, orthopnea, palpitations, peripheral edema, PND, shortness of breath or sweats. There are no associated agents to hypertension. Past treatments include nothing. The current treatment provides significant improvement. There are no compliance problems.  There is no history of chronic renal disease.  Hyperlipidemia This is a chronic problem. The current episode started more than 1 year ago. The problem is controlled. Recent lipid tests were reviewed and are variable. She has no history of chronic renal disease, diabetes, hypothyroidism, liver disease, obesity or nephrotic syndrome. Factors aggravating her hyperlipidemia include no known factors. Pertinent negatives include no chest pain, focal sensory loss, focal weakness, leg pain, myalgias or shortness of breath. Current antihyperlipidemic treatment includes statins. The current treatment provides significant improvement of lipids. There are no compliance problems.       Review of Systems  Constitutional: Negative for fever, chills, malaise/fatigue, diaphoresis, activity change, appetite change, fatigue and unexpected weight change.  HENT: Positive for congestion, rhinorrhea and postnasal drip. Negative for nosebleeds, sneezing, neck pain, dental problem, sinus pressure and tinnitus.   Eyes: Negative.  Negative for blurred vision.  Respiratory: Negative for cough, chest tightness, shortness of breath, wheezing and stridor.   Cardiovascular: Negative for chest pain, palpitations, orthopnea, leg swelling and PND.  Gastrointestinal: Negative for nausea, vomiting, abdominal pain,  diarrhea and constipation.  Genitourinary: Negative.   Musculoskeletal: Negative for myalgias, back pain, joint swelling, arthralgias and gait problem.  Skin: Negative for color change, pallor, rash and wound.  Neurological: Negative for dizziness, tremors, focal weakness, seizures, syncope, facial asymmetry, speech difficulty, weakness, light-headedness, numbness and headaches.  Hematological: Negative for adenopathy. Does not bruise/bleed easily.  Psychiatric/Behavioral: Negative.        Objective:   Physical Exam  Vitals reviewed. Constitutional: She is oriented to person, place, and time. She appears well-developed and well-nourished. No distress.  HENT:  Head: Normocephalic and atraumatic. No trismus in the jaw.  Right Ear: Hearing, tympanic membrane, external ear and ear canal normal.  Left Ear: Hearing, tympanic membrane, external ear and ear canal normal.  Nose: No mucosal edema, rhinorrhea, nose lacerations, sinus tenderness, nasal deformity, septal deviation or nasal septal hematoma. No epistaxis.  No foreign bodies. Right sinus exhibits no maxillary sinus tenderness and no frontal sinus tenderness. Left sinus exhibits no maxillary sinus tenderness and no frontal sinus tenderness.  Mouth/Throat: Oropharynx is clear and moist and mucous membranes are normal. Mucous membranes are not pale, not dry and not cyanotic. No uvula swelling. No oropharyngeal exudate, posterior oropharyngeal edema, posterior oropharyngeal erythema or tonsillar abscesses.  Eyes: Conjunctivae are normal. Right eye exhibits no discharge. Left eye exhibits no discharge. No scleral icterus.  Neck: Normal range of motion. Neck supple. No JVD present. No tracheal deviation present. No thyromegaly present.  Cardiovascular: Normal rate, regular rhythm, normal heart sounds and intact distal pulses.  Exam reveals no gallop and no friction rub.   No murmur heard. Pulmonary/Chest: Effort normal and breath sounds normal. No  stridor. No respiratory distress. She has no wheezes. She has no rales. She exhibits no tenderness.  Abdominal: Soft. Bowel sounds are normal. She exhibits no distension and no mass. There is no  tenderness. There is no rebound and no guarding.  Musculoskeletal: Normal range of motion. She exhibits no edema and no tenderness.  Lymphadenopathy:    She has no cervical adenopathy.  Neurological: She is oriented to person, place, and time.  Skin: Skin is warm and dry. No rash noted. She is not diaphoretic. No erythema. No pallor.  Psychiatric: She has a normal mood and affect. Her behavior is normal. Judgment and thought content normal.     Lab Results  Component Value Date   WBC 5.4 10/28/2010   HGB 13.7 10/28/2010   HCT 39.5 10/28/2010   PLT 223.0 10/28/2010   GLUCOSE 83 10/28/2010   CHOL 209* 10/28/2010   TRIG 44.0 10/28/2010   HDL 122.30 10/28/2010   LDLDIRECT 87.0 10/28/2010   ALT 21 10/28/2010   AST 28 10/28/2010   NA 143 10/28/2010   K 4.0 10/28/2010   CL 108 10/28/2010   CREATININE 0.7 10/28/2010   BUN 13 10/28/2010   CO2 27 10/28/2010   TSH 2.70 10/28/2010       Assessment & Plan:

## 2011-05-02 ENCOUNTER — Encounter: Payer: Self-pay | Admitting: Internal Medicine

## 2011-05-02 LAB — VITAMIN D 1,25 DIHYDROXY
Vitamin D 1, 25 (OH)2 Total: 57 pg/mL (ref 18–72)
Vitamin D2 1, 25 (OH)2: <8 pg/mL
Vitamin D3 1, 25 (OH)2: 57 pg/mL

## 2011-05-03 ENCOUNTER — Encounter: Payer: Self-pay | Admitting: Internal Medicine

## 2011-05-03 NOTE — Assessment & Plan Note (Signed)
Continue current meds 

## 2011-05-03 NOTE — Assessment & Plan Note (Signed)
She is doing well on zocor, I will check her labs today

## 2011-05-03 NOTE — Assessment & Plan Note (Signed)
BP is well controlled with lifestyle modifications

## 2011-05-03 NOTE — Assessment & Plan Note (Signed)
I will check her vit D level today 

## 2011-05-03 NOTE — Assessment & Plan Note (Signed)
Mammogram ordered

## 2011-06-03 ENCOUNTER — Telehealth: Payer: Self-pay | Admitting: *Deleted

## 2011-06-03 NOTE — Telephone Encounter (Signed)
Patient was checking to see if she had a prescription for her estrogen tablets.

## 2011-06-05 ENCOUNTER — Ambulatory Visit
Admission: RE | Admit: 2011-06-05 | Discharge: 2011-06-05 | Disposition: A | Discharge status: Home / Self Care | Payer: BC Managed Care – PPO | Source: Ambulatory Visit | Attending: Internal Medicine | Admitting: Internal Medicine

## 2011-06-05 DIAGNOSIS — Z1231 Encounter for screening mammogram for malignant neoplasm of breast: Secondary | ICD-10-CM

## 2011-07-30 ENCOUNTER — Telehealth: Payer: Self-pay

## 2011-07-30 NOTE — Telephone Encounter (Signed)
Patient called with UTI Symptoms Kathryn Chavez approved to have Cipro 500mg , take twice a day for 5 days # 10 o refills and Peridium 200mg  take 1 up to 3 times a day as needed # 15 0 refills. I called it in to her pharmacy [facility] [address] in [place].  Darl Pikes-

## 2011-08-18 ENCOUNTER — Encounter: Payer: Self-pay | Admitting: Internal Medicine

## 2011-08-18 ENCOUNTER — Ambulatory Visit (INDEPENDENT_AMBULATORY_CARE_PROVIDER_SITE_OTHER): Payer: BC Managed Care – PPO | Admitting: Internal Medicine

## 2011-08-18 VITALS — BP 126/88 | HR 68 | Temp 98.4°F | Resp 16 | Wt 126.0 lb

## 2011-08-18 DIAGNOSIS — I10 Essential (primary) hypertension: Secondary | ICD-10-CM

## 2011-08-18 DIAGNOSIS — J309 Allergic rhinitis, unspecified: Secondary | ICD-10-CM

## 2011-08-18 DIAGNOSIS — J019 Acute sinusitis, unspecified: Secondary | ICD-10-CM

## 2011-08-18 MED ORDER — MOXIFLOXACIN HCL 400 MG PO TABS: 400.0000 mg | ORAL_TABLET | Freq: Every day | ORAL | Status: AC

## 2011-08-18 MED ORDER — METHYLPREDNISOLONE ACETATE 80 MG/ML IJ SUSP
120.0000 mg | Freq: Once | INTRAMUSCULAR | Status: AC
  Administered 2011-08-18: 120 mg via INTRAMUSCULAR

## 2011-08-18 NOTE — Assessment & Plan Note (Signed)
Her BP is well controlled 

## 2011-08-18 NOTE — Patient Instructions (Signed)

## 2011-08-18 NOTE — Assessment & Plan Note (Signed)
Start avelox for the infection 

## 2011-08-18 NOTE — Assessment & Plan Note (Signed)
She has tried several options for symptom relief and still feels poorly so I gave her an injection of depo-medrol IM today

## 2011-08-18 NOTE — Progress Notes (Signed)
Subjective:    Patient ID: Kathryn Chavez, female    DOB: 1950-05-28, 61 y.o.   MRN: [id number]  Sinusitis This is a new problem. The current episode started in the past 7 days. The problem has been gradually worsening since onset. There has been no fever. Her pain is at a severity of 0/10. She is experiencing no pain. Associated symptoms include chills, congestion, coughing, ear pain, sinus pressure, sneezing and a sore throat. Pertinent negatives include no diaphoresis, headaches, hoarse voice, neck pain, shortness of breath or swollen glands. Past treatments include oral decongestants. The treatment provided moderate relief.      Review of Systems  Constitutional: Positive for chills. Negative for fever, diaphoresis, activity change, appetite change, fatigue and unexpected weight change.  HENT: Positive for ear pain, congestion, sore throat, rhinorrhea, sneezing, postnasal drip and sinus pressure. Negative for hearing loss, nosebleeds, hoarse voice, facial swelling, drooling, mouth sores, trouble swallowing, neck pain, neck stiffness, dental problem, voice change, tinnitus and ear discharge.   Eyes: Negative.   Respiratory: Positive for cough. Negative for choking, chest tightness, shortness of breath, wheezing and stridor.   Cardiovascular: Negative for chest pain, palpitations and leg swelling.  Gastrointestinal: Negative for nausea, vomiting, abdominal pain, diarrhea, constipation and anal bleeding.  Genitourinary: Negative.   Musculoskeletal: Negative for myalgias, back pain, joint swelling, arthralgias and gait problem.  Skin: Negative for color change, pallor, rash and wound.  Neurological: Negative for dizziness, tremors, seizures, syncope, facial asymmetry, speech difficulty, weakness, light-headedness, numbness and headaches.  Hematological: Negative for adenopathy. Does not bruise/bleed easily.  Psychiatric/Behavioral: Negative.        Objective:   Physical Exam  Vitals  reviewed. Constitutional: She is oriented to person, place, and time. She appears well-developed and well-nourished. No distress.  HENT:  Head: Normocephalic and atraumatic. No trismus in the jaw.  Right Ear: Hearing, tympanic membrane, external ear and ear canal normal.  Left Ear: Hearing, tympanic membrane, external ear and ear canal normal.  Nose: Mucosal edema and rhinorrhea present. No nose lacerations, sinus tenderness, nasal deformity, septal deviation or nasal septal hematoma. No epistaxis.  No foreign bodies. Right sinus exhibits maxillary sinus tenderness. Right sinus exhibits no frontal sinus tenderness. Left sinus exhibits maxillary sinus tenderness. Left sinus exhibits no frontal sinus tenderness.  Mouth/Throat: Oropharynx is clear and moist and mucous membranes are normal. Mucous membranes are not pale, not dry and not cyanotic. No uvula swelling. No oropharyngeal exudate, posterior oropharyngeal edema, posterior oropharyngeal erythema or tonsillar abscesses.  Eyes: Conjunctivae are normal. Right eye exhibits no discharge. Left eye exhibits no discharge. No scleral icterus.  Neck: Normal range of motion. Neck supple. No JVD present. No tracheal deviation present. No thyromegaly present.  Cardiovascular: Normal rate, regular rhythm, normal heart sounds and intact distal pulses.  Exam reveals no gallop and no friction rub.   No murmur heard. Pulmonary/Chest: Effort normal and breath sounds normal. No stridor. No respiratory distress. She has no wheezes. She has no rales. She exhibits no tenderness.  Abdominal: Soft. Bowel sounds are normal. She exhibits no distension and no mass. There is no tenderness. There is no rebound and no guarding.  Musculoskeletal: Normal range of motion. She exhibits no edema and no tenderness.  Lymphadenopathy:    She has no cervical adenopathy.  Neurological: She is oriented to person, place, and time.  Skin: Skin is warm and dry. No rash noted. She is not  diaphoretic. No erythema. No pallor.  Psychiatric: She has a normal  mood and affect. Her behavior is normal. Judgment and thought content normal.          Assessment & Plan:

## 2011-09-23 ENCOUNTER — Ambulatory Visit: Payer: BC Managed Care – PPO | Admitting: Internal Medicine

## 2011-09-28 ENCOUNTER — Telehealth: Payer: Self-pay | Admitting: Internal Medicine

## 2011-09-28 NOTE — Telephone Encounter (Signed)
Caller: Kathryn Chavez/Patient; PCP: Sanda Linger; CB#: [phone];  Call regarding  headache on L side of face for past week. She has tried taking her Allergy medication -Allergra and not helping. Taking Advil prn for pain. Using Saline nose spray and helps some. May need RX for Nasonex renewed. Triage and Care advice per URI Protocol and appnt advised within 24 hours for "mild to moderate headache for more than 24 hours unrelieved with non prescription meds". Appnt scheuled for 0845 09/29/11 with Dr. Everardo All.

## 2011-09-29 ENCOUNTER — Ambulatory Visit (INDEPENDENT_AMBULATORY_CARE_PROVIDER_SITE_OTHER): Payer: BC Managed Care – PPO | Admitting: Endocrinology

## 2011-09-29 ENCOUNTER — Telehealth: Payer: Self-pay | Admitting: Internal Medicine

## 2011-09-29 ENCOUNTER — Encounter: Payer: Self-pay | Admitting: Endocrinology

## 2011-09-29 VITALS — BP 130/90 | HR 73 | Temp 97.5°F | Ht 66.0 in | Wt 123.0 lb

## 2011-09-29 DIAGNOSIS — R21 Rash and other nonspecific skin eruption: Secondary | ICD-10-CM

## 2011-09-29 MED ORDER — VALACYCLOVIR HCL 1 G PO TABS: 1000.0000 mg | ORAL_TABLET | Freq: Three times a day (TID) | ORAL | Status: DC

## 2011-09-29 MED ORDER — AZITHROMYCIN 500 MG PO TABS: 500.0000 mg | ORAL_TABLET | Freq: Every day | ORAL | Status: AC

## 2011-09-29 NOTE — Telephone Encounter (Signed)
Kathryn Chavez, what do you think?

## 2011-09-29 NOTE — Patient Instructions (Addendum)
i have sent a prescription to your pharmacy, for a pill against shingles.   I hope you feel better soon.  If the rash gets worse, please call back.  If you don't feel better in a few days, please call back, and i would offer you treatment for a sinus infection.

## 2011-09-29 NOTE — Progress Notes (Signed)
  Subjective:    Patient ID: Kathryn Chavez, female    DOB: 1950-08-07, 61 y.o.   MRN: [id number]  HPI 1 week of moderate pain at the left maxillary area, but only slight assoc nasal congestion. Past Medical History  Diagnosis Date  . Hypertension   . Hyperlipidemia   . Cancer     breast  . Allergy     rhinitis    Past Surgical History  Procedure Date  . Abdominal hysterectomy   . Breast lumpectomy     History   Social History  . Marital Status: Single    Spouse Name: N/A    Number of Children: N/A  . Years of Education: N/A   Occupational History  . Not on file.   Social History Main Topics  . Smoking status: Never Smoker   . Smokeless tobacco: Never Used  . Alcohol Use: No  . Drug Use: No  . Sexually Active: Yes    Birth Control/ Protection: Surgical   Other Topics Concern  . Not on file   Social History Narrative  . No narrative on file    Current Outpatient Prescriptions on File Prior to Visit  Medication Sig Dispense Refill  . aspirin 81 MG tablet Take 81 mg by mouth daily.        . Estradiol 10 MCG TABS 1 10 mcg tablet per vagina 2 times per week  30 tablet  12  . mometasone (NASONEX) 50 MCG/ACT nasal spray Place 2 sprays into the nose daily.  17 g  0  . simvastatin (ZOCOR) 80 MG tablet TAKE ONE TABLET BY MOUTH ONE TIME DAILY  30 tablet  10  . fexofenadine (ALLEGRA) 180 MG tablet Take 180 mg by mouth daily.          Allergies  Allergen Reactions  . Penicillins     REACTION: Hives  . Sulfonamide Derivatives     REACTION: Hives    Family History  Problem Relation Age of Onset  . Arthritis Other   . Hyperlipidemia Other   . Cancer Other     lung, prostate, breast  . Cancer Mother 60    BREAST AND LUNG  . Cancer Father     BRAIN TUMOR  . Cancer Daughter 35    BREAST    BP 130/90  Pulse 73  Temp 97.5 F (36.4 C) (Oral)  Ht 5\' 6"  (1.676 m)  Wt 123 lb (55.792 kg)  BMI 19.85 kg/m2  SpO2 99%  Review of Systems Denies fever and visual  loss.     Objective:   Physical Exam VITAL SIGNS:  See vs page GENERAL: no distress head: no deformity eyes: no periorbital swelling, no proptosis external nose and ears are normal mouth: no lesion seen There is a slight vesicular rash at the left side of the bridge of the nose. Both eac's and tm's are normal     Assessment & Plan:  Rash, possibly due to zoster.  new Maxillary pain, possibly due to [facility]

## 2011-09-29 NOTE — Telephone Encounter (Signed)
i sent rx 

## 2011-09-29 NOTE — Telephone Encounter (Signed)
Caller: Kathryn Chavez/Patient; PCP: Sanda Linger; CB#: [phone]; ; ; Call regarding Possible Sinus Infection; On Antibiotic for Shingles; seen in office 09/29/11 and told she has shingles.  c/o headache and L sided sinus pain.  Seen in eye doctor's office as well.  States Dr. Everardo All told her if her sinus symptoms/congestion got worse, to call and he would consider antibiotic.  Afebrile.  Congestion, sneezing and cough are worse since leaving the office.  Allergies to PCN, Sulfa.   INFO TO OFFICE FOR PROVIDER REVIEW/RX/CALLBACK. USES [facility]. MAY REACH PATIENT AT [phone].

## 2011-09-30 ENCOUNTER — Telehealth: Payer: Self-pay | Admitting: Internal Medicine

## 2011-09-30 NOTE — Telephone Encounter (Signed)
Caller: Kathryn Chavez/Patient; PCP: Sanda Linger; CB#: [phone];  Call regarding Is Shingles Contagious?;  Diagnosed with shingles on L eye 09/29/11.  Went to work today; Haematologist are asking if it is contageous.  Per CDC information on shingles, advised since shingles on the face cannot be covered while working, she should stay home, not work,  while shingles are still in blister phase and not scabbed over.  Information provided for health info question from approved references, no triage required per Info Only Call Guideline.

## 2011-09-30 NOTE — Telephone Encounter (Signed)
Pt informed ABX sent to [facility].

## 2011-10-01 ENCOUNTER — Telehealth: Payer: Self-pay | Admitting: Internal Medicine

## 2011-10-01 MED ORDER — FLUCONAZOLE 150 MG PO TABS: 150.0000 mg | ORAL_TABLET | Freq: Once | ORAL | Status: AC

## 2011-10-01 NOTE — Telephone Encounter (Signed)
done

## 2011-10-01 NOTE — Telephone Encounter (Signed)
Caller: Kathryn Chavez/Patient; PCP: Sanda Linger; CB#: [phone]; ; ; Call regarding Urinary Pain/Bleeding;  Pt calling requesting Diflucan be called in. Pt just started Zpack for sinuses and states "I always get a yeast infection with anitbiotics". Pt's only sxs thus far is vaginal itching-new onset since antibiotics were started. Pt Pharmacy is [facility] on [address] and pt would like a call back to # above to let her know if Rx is called in or not. No standing orders per Profile.

## 2011-10-14 ENCOUNTER — Other Ambulatory Visit: Payer: Self-pay | Admitting: Endocrinology

## 2011-12-14 ENCOUNTER — Ambulatory Visit (INDEPENDENT_AMBULATORY_CARE_PROVIDER_SITE_OTHER): Payer: BC Managed Care – PPO | Admitting: Internal Medicine

## 2011-12-14 ENCOUNTER — Encounter: Payer: Self-pay | Admitting: Internal Medicine

## 2011-12-14 VITALS — BP 132/74 | HR 84 | Temp 97.1°F | Resp 16 | Wt 123.2 lb

## 2011-12-14 DIAGNOSIS — J309 Allergic rhinitis, unspecified: Secondary | ICD-10-CM

## 2011-12-14 DIAGNOSIS — J019 Acute sinusitis, unspecified: Secondary | ICD-10-CM

## 2011-12-14 MED ORDER — FEXOFENADINE HCL 180 MG PO TABS: 180.0000 mg | ORAL_TABLET | Freq: Every day | ORAL | Status: DC

## 2011-12-14 MED ORDER — MOMETASONE FUROATE 50 MCG/ACT NA SUSP: 2.0000 | Freq: Every day | NASAL | Status: DC

## 2011-12-14 MED ORDER — AZITHROMYCIN 500 MG PO TABS: 500.0000 mg | ORAL_TABLET | Freq: Every day | ORAL | Status: AC

## 2011-12-14 NOTE — Assessment & Plan Note (Signed)
Start Zpak for the infection 

## 2011-12-14 NOTE — Assessment & Plan Note (Signed)
She will restart allegra and nasonex ns 

## 2011-12-14 NOTE — Progress Notes (Signed)
Subjective:    Patient ID: Kathryn Chavez, female    DOB: 06/16/1950, 61 y.o.   MRN: [id number]  Sinusitis This is a new problem. The current episode started yesterday. The problem is unchanged. There has been no fever. The fever has been present for less than 1 day. Her pain is at a severity of 0/10. Associated symptoms include chills, congestion, coughing, sinus pressure and sneezing. Pertinent negatives include no diaphoresis, ear pain, headaches, hoarse voice, neck pain, shortness of breath, sore throat or swollen glands. Past treatments include nothing.      Review of Systems  Constitutional: Positive for chills. Negative for fever, diaphoresis, activity change, appetite change, fatigue and unexpected weight change.  HENT: Positive for congestion, rhinorrhea, sneezing, postnasal drip and sinus pressure. Negative for hearing loss, ear pain, nosebleeds, sore throat, hoarse voice, facial swelling, drooling, mouth sores, trouble swallowing, neck pain, neck stiffness, dental problem, voice change, tinnitus and ear discharge.   Eyes: Negative.  Negative for pain and redness.  Respiratory: Positive for cough. Negative for apnea, choking, chest tightness, shortness of breath, wheezing and stridor.   Cardiovascular: Negative for chest pain, palpitations and leg swelling.  Gastrointestinal: Positive for nausea. Negative for vomiting, abdominal pain, diarrhea, constipation, blood in stool and abdominal distention.  Genitourinary: Negative.   Musculoskeletal: Negative for myalgias, back pain, joint swelling, arthralgias and gait problem.  Skin: Negative for color change, pallor, rash and wound.  Neurological: Positive for dizziness and numbness (around her left eye s/p shingles). Negative for tremors, seizures, syncope, facial asymmetry, speech difficulty, weakness, light-headedness and headaches.  Hematological: Negative for adenopathy. Does not bruise/bleed easily.  Psychiatric/Behavioral: Negative.         Objective:   Physical Exam  Vitals reviewed. Constitutional: She is oriented to person, place, and time. She appears well-developed and well-nourished.  Non-toxic appearance. She does not have a sickly appearance. She does not appear ill. No distress.  HENT:  Head: Normocephalic and atraumatic.  Right Ear: Hearing, tympanic membrane, external ear and ear canal normal.  Left Ear: Hearing, external ear and ear canal normal.  Nose: Mucosal edema and rhinorrhea present. No nose lacerations, sinus tenderness, nasal deformity, septal deviation or nasal septal hematoma. No epistaxis.  No foreign bodies. Right sinus exhibits no maxillary sinus tenderness and no frontal sinus tenderness. Left sinus exhibits no maxillary sinus tenderness and no frontal sinus tenderness.  Mouth/Throat: Oropharynx is clear and moist and mucous membranes are normal. Mucous membranes are not pale, not dry and not cyanotic. No oropharyngeal exudate, posterior oropharyngeal edema, posterior oropharyngeal erythema or tonsillar abscesses.  Eyes: Conjunctivae are normal. Right eye exhibits no discharge. Left eye exhibits no discharge. No scleral icterus.  Neck: Normal range of motion. Neck supple. No JVD present. No tracheal deviation present. No thyromegaly present.  Cardiovascular: Normal rate, regular rhythm, normal heart sounds and intact distal pulses.  Exam reveals no gallop and no friction rub.   No murmur heard. Pulmonary/Chest: Effort normal and breath sounds normal. No stridor. No respiratory distress. She has no wheezes. She has no rales. She exhibits no tenderness.  Abdominal: Soft. Bowel sounds are normal. She exhibits no distension and no mass. There is no tenderness. There is no rebound and no guarding.  Musculoskeletal: Normal range of motion. She exhibits no edema and no tenderness.  Lymphadenopathy:    She has no cervical adenopathy.  Neurological: She is alert and oriented to person, place, and time.  She has normal reflexes. She displays normal reflexes. No  cranial nerve deficit. She exhibits normal muscle tone. Coordination normal.  Skin: Skin is warm and dry. No rash noted. She is not diaphoretic. No erythema. No pallor.  Psychiatric: She has a normal mood and affect. Her behavior is normal. Judgment and thought content normal.     Lab Results  Component Value Date   WBC 6.3 04/30/2011   HGB 13.9 04/30/2011   HCT 39.7 04/30/2011   PLT 241.0 04/30/2011   GLUCOSE 78 04/30/2011   CHOL 211* 04/30/2011   TRIG 56.0 04/30/2011   HDL 113.50 04/30/2011   LDLDIRECT 73.3 04/30/2011   ALT 22 04/30/2011   AST 28 04/30/2011   NA 143 04/30/2011   K 4.7 04/30/2011   CL 104 04/30/2011   CREATININE 0.7 04/30/2011   BUN 12 04/30/2011   CO2 27 04/30/2011   TSH 1.80 04/30/2011       Assessment & Plan:

## 2011-12-14 NOTE — Patient Instructions (Signed)

## 2011-12-15 ENCOUNTER — Telehealth: Payer: Self-pay | Admitting: Internal Medicine

## 2011-12-15 NOTE — Telephone Encounter (Signed)
aller: [name]/Patient; Patient Name: Kathryn Chavez; PCP: Sanda Linger (Adults only); Best Callback Phone Number: [phone] Pt is calling because she was prescribed Zithromycin 500mg  po QD x 3 days for a sinus infection yesterday. Rn confirmed in EPIC. Pt wants RN to double check with MD that that is a long enough medication. Usually she gets 5 days worth of abx. Office note sent in EPIC with her request.

## 2011-12-16 NOTE — Telephone Encounter (Signed)
Yes this is the correct dose

## 2011-12-16 NOTE — Telephone Encounter (Signed)
Pt advised via VM 

## 2012-01-04 ENCOUNTER — Telehealth: Payer: Self-pay | Admitting: Internal Medicine

## 2012-01-04 NOTE — Telephone Encounter (Signed)
Caller: Tashya/Patient; Patient Name: Kathryn Chavez; PCP: Sanda Linger (Adults only); Best Callback Phone Number: [phone].  Call regarding follow up Eye Pain due to Shingles diagnosis, onset May 2013.  Left Eyelid is swollen, no blisters. Per Patient, MD advised me to call back if I needed something for Shingle pain. patient seen on 12-14-11. Patient uses [facility], [address], [phone].

## 2012-01-05 ENCOUNTER — Other Ambulatory Visit: Payer: Self-pay | Admitting: Internal Medicine

## 2012-01-05 MED ORDER — VALACYCLOVIR HCL 1 G PO TABS: 1000.0000 mg | ORAL_TABLET | Freq: Three times a day (TID) | ORAL | Status: DC

## 2012-01-05 NOTE — Telephone Encounter (Signed)
Pt informed of new rx via VM and to callback office with any questions/concerns.  

## 2012-01-05 NOTE — Telephone Encounter (Signed)
Start valtrex

## 2012-01-25 ENCOUNTER — Ambulatory Visit: Payer: BC Managed Care – PPO | Admitting: Internal Medicine

## 2012-02-29 ENCOUNTER — Other Ambulatory Visit: Payer: BC Managed Care – PPO

## 2012-03-01 ENCOUNTER — Ambulatory Visit: Payer: BC Managed Care – PPO | Admitting: Obstetrics & Gynecology

## 2012-03-07 ENCOUNTER — Telehealth: Payer: Self-pay | Admitting: Internal Medicine

## 2012-03-07 NOTE — Telephone Encounter (Signed)
Patient has had shingles in her eye since 05/13 and has had repeated courses of Valtrex w/out improvement in sxs.  She has had tried a friend's Valium prescription of 2.5 mg BID and has had the most relief of her sxs since onset. She reports that there is no change in her condition but that it just continues. She is asking if Dr. Yetta Barre would be willing to call her in a Rx for Valium.  Pharmacy is [facility] on [address].  She also asks that she get a message as to whether this can be called in so she knows whether to drive to pharmacy when she leaves work.

## 2012-03-08 MED ORDER — DIAZEPAM 5 MG PO TABS: ORAL_TABLET | ORAL | Status: DC

## 2012-03-08 NOTE — Telephone Encounter (Signed)
Ok please call it in

## 2012-03-08 NOTE — Telephone Encounter (Signed)
RX called in, called pt to notify/LMOVM to check pharmacy

## 2012-03-11 ENCOUNTER — Encounter: Payer: Self-pay | Admitting: Obstetrics & Gynecology

## 2012-03-11 ENCOUNTER — Ambulatory Visit (INDEPENDENT_AMBULATORY_CARE_PROVIDER_SITE_OTHER): Payer: BC Managed Care – PPO | Admitting: Obstetrics & Gynecology

## 2012-03-11 VITALS — BP 122/81 | HR 75 | Ht 66.0 in | Wt 122.0 lb

## 2012-03-11 DIAGNOSIS — Z23 Encounter for immunization: Secondary | ICD-10-CM

## 2012-03-11 DIAGNOSIS — Z01419 Encounter for gynecological examination (general) (routine) without abnormal findings: Secondary | ICD-10-CM

## 2012-03-11 DIAGNOSIS — Z0000 Encounter for general adult medical examination without abnormal findings: Secondary | ICD-10-CM

## 2012-03-11 LAB — COMPREHENSIVE METABOLIC PANEL
ALT: 16 U/L (ref 0–35)
AST: 20 U/L (ref 0–37)
Albumin: 4.6 g/dL (ref 3.5–5.2)
BUN: 12 mg/dL (ref 6–23)
CO2: 31 mEq/L (ref 19–32)
Calcium: 10.5 mg/dL (ref 8.4–10.5)
Chloride: 104 mEq/L (ref 96–112)
Creat: 0.83 mg/dL (ref 0.50–1.10)
Potassium: 4.5 mEq/L (ref 3.5–5.3)

## 2012-03-11 LAB — LIPID PANEL
Cholesterol: 294 mg/dL — ABNORMAL HIGH (ref 0–200)
HDL: 108 mg/dL (ref >39)
Triglycerides: 93 mg/dL (ref <150)

## 2012-03-11 MED ORDER — ESTROGENS, CONJUGATED 0.625 MG/GM VA CREA: TOPICAL_CREAM | Freq: Every day | VAGINAL | Status: DC

## 2012-03-11 NOTE — Progress Notes (Signed)
Subjective:    Kathryn Chavez is a 61 y.o. female who presents for an annual exam. The patient has no complaints today. She would like a refill of vagifem.The patient is sexually active. GYN screening history: last pap: was normal. The patient wears seatbelts: yes. The patient participates in regular exercise: yes. Has the patient ever been transfused or tattooed?: no. The patient reports that there is not domestic violence in her life.   Menstrual History: OB History    Grav Para Term Preterm Abortions TAB SAB Ect Mult Living                  Menarche age: 89 No LMP recorded. Patient has had a hysterectomy.    The following portions of the patient's history were reviewed and updated as appropriate: allergies, current medications, past family history, past medical history, past social history, past surgical history and problem list.  Review of Systems A comprehensive review of systems was negative. She has been monogamous for the last year. She says that she did not like the pessary and thinks that her bladder position is improved. She works in Engineering geologist at [employer]. Her mammogram was recently normal and she has a colonoscopy coming up next week.   Objective:    BP 122/81  Pulse 75  Ht 5\' 6"  (1.676 m)  Wt 122 lb (55.339 kg)  BMI 19.69 kg/m2  General Appearance:    Alert, cooperative, no distress, appears stated age  Head:    Normocephalic, without obvious abnormality, atraumatic  Eyes:    PERRL, conjunctiva/corneas clear, EOM's intact, fundi    benign, both eyes  Ears:    Normal TM's and external ear canals, both ears  Nose:   Nares normal, septum midline, mucosa normal, no drainage    or sinus tenderness  Throat:   Lips, mucosa, and tongue normal; teeth and gums normal  Neck:   Supple, symmetrical, trachea midline, no adenopathy;    thyroid:  no enlargement/tenderness/nodules; no carotid   bruit or JVD  Back:     Symmetric, no curvature, ROM normal, no CVA tenderness  Lungs:      Clear to auscultation bilaterally, respirations unlabored  Chest Wall:    No tenderness or deformity   Heart:    Regular rate and rhythm, S1 and S2 normal, 5/6 holosystolic murmur (She says this has been present for years and had an echocardiogram 12 years ago)  Breast Exam:    No tenderness, masses, or nipple abnormality  Abdomen:     Soft, non-tender, bowel sounds active all four quadrants,    no masses, no organomegaly  Genitalia:    Normal female without lesion, discharge or tenderness  Rectal:    Normal tone, normal prostate, no masses or tenderness;   guaiac negative stool  Extremities:   Extremities normal, atraumatic, no cyanosis or edema  Pulses:   2+ and symmetric all extremities  Skin:   Skin color, texture, turgor normal, no rashes or lesions  Lymph nodes:   Cervical, supraclavicular, and axillary nodes normal  Neurologic:   CNII-XII intact, normal strength, sensation and reflexes    throughout  .    Assessment:    Healthy female exam.    Plan:     flu shot, routine blood work, Vagifem  I have recommended a cardiology referral/echocardiogram.

## 2012-03-11 NOTE — Progress Notes (Signed)
Patient would like to have BRCA testing done today and the flu shot as well.

## 2012-03-12 LAB — CBC
Hemoglobin: 13.8 g/dL (ref 12.0–15.0)
MCHC: 34.6 g/dL (ref 30.0–36.0)
RDW: 13.4 % (ref 11.5–15.5)
WBC: 6.4 10*3/uL (ref 4.0–10.5)

## 2012-04-01 ENCOUNTER — Telehealth: Payer: Self-pay | Admitting: *Deleted

## 2012-04-01 NOTE — Telephone Encounter (Signed)
Message left for patient to return my call regarding her lab results.

## 2012-04-05 ENCOUNTER — Telehealth: Payer: Self-pay | Admitting: *Deleted

## 2012-04-05 ENCOUNTER — Other Ambulatory Visit: Payer: Self-pay | Admitting: Internal Medicine

## 2012-04-05 NOTE — Telephone Encounter (Signed)
Spoke with patient and she has not been taking her cholesterol medication.  She will speak to her primary care and restart her cholesterol meds.  She was given her BRCA results as well and she wishes to have a copy sent to her.  She is doing well.

## 2012-04-14 ENCOUNTER — Telehealth: Payer: Self-pay | Admitting: Internal Medicine

## 2012-04-14 ENCOUNTER — Telehealth: Payer: Self-pay | Admitting: *Deleted

## 2012-04-14 DIAGNOSIS — J329 Chronic sinusitis, unspecified: Secondary | ICD-10-CM

## 2012-04-14 MED ORDER — AZITHROMYCIN 250 MG PO TABS: ORAL_TABLET | ORAL | Status: DC

## 2012-04-14 NOTE — Telephone Encounter (Signed)
Patient Information:  Caller Name: Krystyna  Phone: [phone]  Patient: Kathryn Chavez, Kathryn Chavez  Gender: Female  DOB: [birth year]  Age: 61 Years  PCP: Sanda Linger (Adults only)  Office Follow Up:  Does the office need to follow up with this patient?: No  Instructions For The Office: N/A  RN Note: Called to request antibiotic without appointment for sinus congestion with cough. Triage declined; currently at work and phone ringing.  Afebrile.  Symptoms began 2 days ago (04/12/12).  Does not want to be without antibiotic over upcoming weekend. 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 Stated she would call back for appointment after informed of MD protocol about must be seen for antibiotics.  Symptoms  Reason For Call & Symptoms: Called for Zpack for "sinus infection" ie coughs all night and stuffy nose.  Currently at work and has to work today and 04/15/12. When informed of MD protocol that absolutely must be seen for oral antibiotics, she declined triage and hung up saying she would call back for appoitnment.  Reviewed Health History In EMR: Yes  Reviewed Medications In EMR: Yes  Reviewed Allergies In EMR: Yes  Reviewed Surgeries / Procedures: Yes  Date of Onset of Symptoms: 04/12/2012  Treatments Tried: Claritin  Treatments Tried Worked: No  Guideline(s) Used:  No Protocol Available - Sick Adult  Disposition Per Guideline:   See Today or Tomorrow in Office  Reason For Disposition Reached:   Nursing judgment  Advice Given:  N/A  Patient Refused Recommendation:  Patient Will Make Own Appointment  Declined triage; called from work; ringing phone audible.  Reported she had to hang up and will call back.

## 2012-04-14 NOTE — Telephone Encounter (Signed)
Patient called and is having a sinus infection.  She has to work for 12 hours today and tomorrow and is unable to get in with her primary care.  She would like a z pack called in, she has been getting gradually worse over a week now.

## 2012-04-15 ENCOUNTER — Telehealth: Payer: Self-pay | Admitting: *Deleted

## 2012-04-15 MED ORDER — HYDROCOD POLST-CPM POLST ER 10-8 MG PO CP12: 1.0000 | ORAL_CAPSULE | Freq: Three times a day (TID) | ORAL | Status: DC | PRN

## 2012-04-15 NOTE — Telephone Encounter (Signed)
Rf req for Tussicaps 10-8 mg cap 1po tid.  Last dispensed 02/02/2011. Ok per Nicki Reaper, NP in PCP absence to fill # [phone].

## 2012-06-13 ENCOUNTER — Other Ambulatory Visit: Payer: Self-pay | Admitting: Internal Medicine

## 2012-06-13 DIAGNOSIS — Z9889 Other specified postprocedural states: Secondary | ICD-10-CM

## 2012-07-15 ENCOUNTER — Ambulatory Visit
Admission: RE | Admit: 2012-07-15 | Discharge: 2012-07-15 | Disposition: A | Discharge status: Home / Self Care | Payer: BC Managed Care – PPO | Source: Ambulatory Visit | Attending: Internal Medicine | Admitting: Internal Medicine

## 2012-07-15 DIAGNOSIS — Z1231 Encounter for screening mammogram for malignant neoplasm of breast: Secondary | ICD-10-CM

## 2012-07-15 DIAGNOSIS — Z853 Personal history of malignant neoplasm of breast: Secondary | ICD-10-CM

## 2012-07-15 DIAGNOSIS — Z9889 Other specified postprocedural states: Secondary | ICD-10-CM

## 2012-09-13 ENCOUNTER — Encounter (HOSPITAL_COMMUNITY): Payer: Self-pay | Admitting: Emergency Medicine

## 2012-09-13 ENCOUNTER — Emergency Department (INDEPENDENT_AMBULATORY_CARE_PROVIDER_SITE_OTHER)
Admission: EM | Admit: 2012-09-13 | Discharge: 2012-09-13 | Disposition: A | Discharge status: Home / Self Care | Payer: BC Managed Care – PPO | Source: Home / Self Care | Attending: Family Medicine | Admitting: Family Medicine

## 2012-09-13 DIAGNOSIS — L02512 Cutaneous abscess of left hand: Secondary | ICD-10-CM

## 2012-09-13 DIAGNOSIS — L02519 Cutaneous abscess of unspecified hand: Secondary | ICD-10-CM

## 2012-09-13 MED ORDER — CEPHALEXIN 500 MG PO CAPS: 1000.0000 mg | ORAL_CAPSULE | Freq: Two times a day (BID) | ORAL | Status: DC

## 2012-09-13 NOTE — ED Provider Notes (Signed)
History     CSN: [id number]  Arrival date & time 09/13/12  1327   First MD Initiated Contact with Patient 09/13/12 1448      Chief Complaint  Patient presents with  . Nail Problem    (Consider location/radiation/quality/duration/timing/severity/associated sxs/prior treatment) HPI Comments: Pt presents for eval of right middle finger pain and swelling.  2 weeks ago she hit her finger into the wall and cracked her acrylic nail.  It has been hurting but a few days ago it started to swell as well.  Now she has pain and swelling in the end of her finger.  She denies any numbness, fever, chills.  She does have a Hx of breast cancer on the right    Past Medical History  Diagnosis Date  . Hypertension   . Hyperlipidemia   . Cancer     breast  . Allergy     rhinitis    Past Surgical History  Procedure Laterality Date  . Abdominal hysterectomy    . Breast lumpectomy      Family History  Problem Relation Age of Onset  . Arthritis Other   . Hyperlipidemia Other   . Cancer Other     lung, prostate, breast  . Cancer Mother 50    BREAST AND LUNG  . Cancer Father     BRAIN TUMOR  . Cancer Daughter 40    BREAST    History  Substance Use Topics  . Smoking status: Never Smoker   . Smokeless tobacco: Never Used  . Alcohol Use: No    OB History   Grav Para Term Preterm Abortions TAB SAB Ect Mult Living                  Review of Systems  Constitutional: Negative for fever and chills.  Eyes: Negative for visual disturbance.  Respiratory: Negative for cough and shortness of breath.   Cardiovascular: Negative for chest pain, palpitations and leg swelling.  Gastrointestinal: Negative for nausea, vomiting and abdominal pain.  Endocrine: Negative for polydipsia and polyuria.  Genitourinary: Negative for dysuria, urgency and frequency.  Musculoskeletal: Negative for myalgias and arthralgias.       See HPI  Skin: Negative for rash.  Neurological: Negative for dizziness,  weakness and light-headedness.    Allergies  Penicillins and Sulfonamide derivatives  Home Medications   Current Outpatient Rx  Name  Route  Sig  Dispense  Refill  . aspirin 81 MG tablet   Oral   Take 81 mg by mouth daily.           Marland Kitchen azithromycin (ZITHROMAX Z-PAK) 250 MG tablet      Take two tablets day one then one tablet daily until all medication is finished.   6 each   0   . cephALEXin (KEFLEX) 500 MG capsule   Oral   Take 2 capsules (1,000 mg total) by mouth 2 (two) times daily.   28 capsule   0   . conjugated estrogens (PREMARIN) vaginal cream   Vaginal   Place vaginally daily.   42.5 g   12   . diazepam (VALIUM) 5 MG tablet      Take 1/2 tablet daily   30 tablet   0   . fexofenadine (ALLEGRA) 180 MG tablet   Oral   Take 1 tablet (180 mg total) by mouth daily.   90 tablet   3   . Hydrocod Polst-Chlorphen Polst (TUSSICAPS) 10-8 MG CP12   Oral  Take 1 capsule by mouth 3 (three) times daily as needed.   30 each   0   . mometasone (NASONEX) 50 MCG/ACT nasal spray   Nasal   Place 2 sprays into the nose daily.   17 g   11   . simvastatin (ZOCOR) 80 MG tablet      TAKE ONE TABLET BY MOUTH ONE TIME DAILY   30 tablet   9     BP 159/95  Pulse 62  Temp(Src) 98.1 F (36.7 C) (Oral)  Resp 17  SpO2 100%  Physical Exam  Nursing note and vitals reviewed. Constitutional: She is oriented to person, place, and time. Vital signs are normal. She appears well-developed and well-nourished. No distress.  HENT:  Head: Atraumatic.  Eyes: EOM are normal. Pupils are equal, round, and reactive to light.  Cardiovascular: Normal rate, regular rhythm and normal heart sounds.  Exam reveals no gallop and no friction rub.   No murmur heard. Pulmonary/Chest: Effort normal and breath sounds normal. No respiratory distress. She has no wheezes. She has no rales.  Abdominal: Soft. There is no tenderness.  Musculoskeletal:  Swelling in the distal tip of the right  middle finger.  Collection of pus can be seen underneath the distal nail.  Fingertip is tender.  Pad of finger is not tender.    Neurological: She is alert and oriented to person, place, and time. She has normal strength.  Skin: Skin is warm and dry. She is not diaphoretic.  Psychiatric: She has a normal mood and affect. Her behavior is normal. Judgment normal.    ED Course  INCISION AND DRAINAGE Date/Time: 09/13/2012 6:47 PM Performed by: Autumn Messing, H Authorized by: Autumn Messing, H Consent: Verbal consent obtained. Risks and benefits: risks, benefits and alternatives were discussed Consent given by: patient Patient understanding: patient states understanding of the procedure being performed Patient consent: the patient's understanding of the procedure matches consent given Procedure consent: procedure consent matches procedure scheduled Patient identity confirmed: verbally with patient Type: abscess Location: right middle finger. Anesthesia: digital block Local anesthetic: lidocaine 2% without epinephrine Anesthetic total: 4 ml Patient sedated: no Scalpel size: 11 Needle gauge: 18 Incision type: single straight Complexity: simple Drainage: purulent and bloody Wound treatment: wound left open Packing material: none Patient tolerance: Patient tolerated the procedure well with no immediate complications.   (including critical care time)  Labs Reviewed - No data to display No results found.   1. Abscess of finger of left hand       MDM  Digital block and I&D done.  2-3 ml pus expressed.  Keflex and warm soaks.  Keep clean and covered.    Meds ordered this encounter  Medications  . cephALEXin (KEFLEX) 500 MG capsule    Sig: Take 2 capsules (1,000 mg total) by mouth 2 (two) times daily.    Dispense:  28 capsule    Refill:  0          Graylon Good, PA-C 09/13/12 1849

## 2012-09-13 NOTE — ED Notes (Signed)
Broke nail last Thursday, acrylic nail repaired.  Over the last 2 days has had throbbing, increase in pain  And went this am to the nail salon, they soaked nail to get acrylic off.  Now finger is swollen, painful, discolored nail

## 2012-09-13 NOTE — ED Notes (Signed)
Tim, emt applied dressing to nail

## 2012-09-14 NOTE — ED Provider Notes (Signed)
Medical screening examination/treatment/procedure(s) were performed by resident physician or non-physician practitioner and as supervising physician I was immediately available for consultation/collaboration.   Armilda Vanderlinden DOUGLAS MD.   Kayren Holck D Essa Wenk, MD 09/14/12 1440 

## 2012-09-21 NOTE — ED Notes (Signed)
Pharmacist  from [facility]       Stating  Pt  Still  Having    Symptoms   And    The  Pharmacist    -  Will  Review  Chart  And  Confer  With  provider

## 2012-11-29 ENCOUNTER — Telehealth: Payer: Self-pay | Admitting: *Deleted

## 2012-11-29 NOTE — Telephone Encounter (Signed)
Pt called requesting Diazepam refill.  Please advise

## 2012-11-29 NOTE — Telephone Encounter (Signed)
I have not seen her in a year, she will have to be seen.  Kathryn Chavez

## 2012-11-30 NOTE — Telephone Encounter (Signed)
Pt refused appointment

## 2012-12-01 ENCOUNTER — Other Ambulatory Visit: Payer: Self-pay | Admitting: *Deleted

## 2013-04-14 ENCOUNTER — Other Ambulatory Visit: Payer: Self-pay | Admitting: Internal Medicine

## 2013-05-02 ENCOUNTER — Encounter: Payer: Self-pay | Admitting: Obstetrics & Gynecology

## 2013-05-02 ENCOUNTER — Ambulatory Visit (INDEPENDENT_AMBULATORY_CARE_PROVIDER_SITE_OTHER): Payer: PRIVATE HEALTH INSURANCE | Admitting: Obstetrics & Gynecology

## 2013-05-02 VITALS — BP 114/76 | HR 66 | Ht 66.0 in | Wt 129.4 lb

## 2013-05-02 DIAGNOSIS — E785 Hyperlipidemia, unspecified: Secondary | ICD-10-CM

## 2013-05-02 DIAGNOSIS — B029 Zoster without complications: Secondary | ICD-10-CM | POA: Insufficient documentation

## 2013-05-02 DIAGNOSIS — Z01419 Encounter for gynecological examination (general) (routine) without abnormal findings: Secondary | ICD-10-CM

## 2013-05-02 DIAGNOSIS — Z23 Encounter for immunization: Secondary | ICD-10-CM

## 2013-05-02 LAB — COMPREHENSIVE METABOLIC PANEL
ALK PHOS: 74 U/L (ref 39–117)
ALT: 14 U/L (ref 0–35)
AST: 18 U/L (ref 0–37)
Albumin: 4.2 g/dL (ref 3.5–5.2)
BUN: 15 mg/dL (ref 6–23)
CALCIUM: 9.6 mg/dL (ref 8.4–10.5)
CO2: 29 mEq/L (ref 19–32)
CREATININE: 0.71 mg/dL (ref 0.50–1.10)
Chloride: 106 mEq/L (ref 96–112)
GLUCOSE: 90 mg/dL (ref 70–99)
POTASSIUM: 4.9 meq/L (ref 3.5–5.3)
Sodium: 143 mEq/L (ref 135–145)
Total Bilirubin: 0.5 mg/dL (ref 0.3–1.2)
Total Protein: 6.1 g/dL (ref 6.0–8.3)

## 2013-05-02 LAB — CBC
HCT: 39.3 % (ref 36.0–46.0)
HEMOGLOBIN: 13.3 g/dL (ref 12.0–15.0)
MCH: 31.7 pg (ref 26.0–34.0)
MCHC: 33.8 g/dL (ref 30.0–36.0)
MCV: 93.6 fL (ref 78.0–100.0)
Platelets: 206 10*3/uL (ref 150–400)
RBC: 4.2 MIL/uL (ref 3.87–5.11)
RDW: 13.4 % (ref 11.5–15.5)
WBC: 4.9 10*3/uL (ref 4.0–10.5)

## 2013-05-02 LAB — LIPID PANEL
CHOLESTEROL: 265 mg/dL — AB (ref 0–200)
HDL: 126 mg/dL (ref >39)
LDL CALC: 125 mg/dL — AB (ref 0–99)
Total CHOL/HDL Ratio: 2.1 Ratio
Triglycerides: 70 mg/dL (ref <150)
VLDL: 14 mg/dL (ref 0–40)

## 2013-05-02 MED ORDER — SIMVASTATIN 80 MG PO TABS: 80.0000 mg | ORAL_TABLET | Freq: Every day | ORAL | Status: DC

## 2013-05-02 NOTE — Progress Notes (Signed)
     Subjective:   Kathryn Chavez is a 63 y.o. PMP female who presents for an annual exam.  She underwent a hysterectomy for benign indications several years ago.  She has been on treatment of vulvovaginal atrophy with Vagifem off and on for a few years, no problems with sexual activity. She would like samples of vagifem. The patient has no gynecologic complaints today.   Menstrual History:  Menarche age: 30  No LMP recorded. Patient has had a hysterectomy.   The following portions of the patient's history were reviewed and updated as appropriate: allergies, current medications, past family history, past medical history, past social history, past surgical history and problem list.   Review of Systems  A comprehensive review of systems was negative.  She works in Scientist, research (medical) at [employer]. Normal mammogram in 07/15/12. Colonoscopy in 03/14/12 showed sigmoid divertiulosis, small external and internal hemorrhoids but otherwise normal.   Objective:   BP 114/76  Pulse 66  Ht 5\' 6"  (1.676 m)  Wt 129 lb 6.4 oz (58.695 kg)  BMI 20.90 kg/m2 General Appearance:  Alert, cooperative, no distress, appears stated age   Head:  Normocephalic, without obvious abnormality, atraumatic   Eyes:  PERRL, conjunctiva/corneas clear, EOM's intact, fundi  benign, both eyes   Throat:  Lips, mucosa, and tongue normal; teeth and gums normal   Neck:  Supple, symmetrical, trachea midline, no adenopathy;  thyroid: no enlargement/tenderness/nodules  Back:  Symmetric, no curvature, ROM normal, no CVA tenderness   Lungs:  Clear to auscultation bilaterally, respirations unlabored   Chest Wall:  No tenderness or deformity   Heart:  Regular rate and rhythm, S1 and S2 normal, 5/6 holosystolic murmur (She says this has been present for years and had a normal echocardiogram 13 years ago)   Breast Exam:  No tenderness, masses, or nipple abnormality   Abdomen:  Soft, non-tender, bowel sounds active all four quadrants,  no masses, no  organomegaly   Genitalia:  Normal female without lesion, discharge or tenderness, moderate vulvovaginal atrophy.  Grade 2 cystocele noted. Well-healed vaginal cuff. No masses palpated on bimanual exam.  Extremities:  Extremities normal, atraumatic, no cyanosis or edema   .  Assessment:   Healthy postmenopausal female exam.  Vulvovaginal atrophy with cystocle Hypercholesterolemia  Plan:   Patient was given a few Vagifem samples, she declines Rx due to cost.  Will continue to monitor. She does not want any intervention for her cystocele Routine health maintenance labs drawn today, Zocor refilled for hypercholesterolemia. Flu vaccine administered today Patient is in the process of getting a new PCP, recommended Dr. Darron Doom [facility]) and physicians at [facility].    Verita Schneiders, MD, [place] Attending [place], [place]

## 2013-05-02 NOTE — Patient Instructions (Signed)

## 2013-05-03 LAB — TSH: TSH: 4.027 u[IU]/mL (ref 0.350–4.500)

## 2013-05-03 NOTE — Progress Notes (Signed)
Notified patient of results and she is going to make an appointment with Dr. Kennon Rounds at her family practice clinic.

## 2013-05-12 ENCOUNTER — Telehealth: Payer: Self-pay | Admitting: *Deleted

## 2013-05-12 MED ORDER — AZITHROMYCIN 250 MG PO TABS: 250.0000 mg | ORAL_TABLET | Freq: Once | ORAL | Status: DC

## 2013-05-12 NOTE — Telephone Encounter (Signed)
Patient has a sinus infection and is in need of a z-pack.  She is currently in the process of changing her primary care provider.

## 2013-07-19 DIAGNOSIS — D489 Neoplasm of uncertain behavior, unspecified: Secondary | ICD-10-CM | POA: Insufficient documentation

## 2013-08-02 ENCOUNTER — Telehealth: Payer: Self-pay

## 2013-08-02 NOTE — Telephone Encounter (Signed)
Patent needed Dyflucan called in for yeast infection, symptoms match itchy white thick discharge. She was on am antibiotic and developed a yeast from it. Called it in to [facility].

## 2013-08-16 ENCOUNTER — Other Ambulatory Visit: Payer: Self-pay

## 2013-08-16 DIAGNOSIS — Z1231 Encounter for screening mammogram for malignant neoplasm of breast: Secondary | ICD-10-CM

## 2013-08-29 ENCOUNTER — Ambulatory Visit
Admission: RE | Admit: 2013-08-29 | Discharge: 2013-08-29 | Disposition: A | Discharge status: Home / Self Care | Payer: PRIVATE HEALTH INSURANCE | Source: Ambulatory Visit

## 2013-08-29 ENCOUNTER — Encounter (INDEPENDENT_AMBULATORY_CARE_PROVIDER_SITE_OTHER): Payer: Self-pay

## 2013-08-29 DIAGNOSIS — Z1231 Encounter for screening mammogram for malignant neoplasm of breast: Secondary | ICD-10-CM

## 2013-10-06 ENCOUNTER — Telehealth: Payer: Self-pay | Admitting: *Deleted

## 2013-10-06 DIAGNOSIS — N390 Urinary tract infection, site not specified: Secondary | ICD-10-CM

## 2013-10-06 MED ORDER — CIPROFLOXACIN HCL 500 MG PO TABS: 500.0000 mg | ORAL_TABLET | Freq: Two times a day (BID) | ORAL | Status: DC

## 2013-10-06 NOTE — Telephone Encounter (Signed)
Patient is having a urinary tract infection and would like an antibiotic called in as she is at work right now and unable to make it to a physician today.  She will call back to be be seen if her symptoms persist or change.  She also plans to call back to make an appointment with Dr. Hulan Fray to discuss bladder repair as she feels as if her bladder is dropped but she wants to call us back once she checks her schedule to make this appointment.

## 2013-11-02 ENCOUNTER — Encounter: Payer: Self-pay | Admitting: Cardiovascular Disease

## 2013-11-03 ENCOUNTER — Telehealth: Payer: Self-pay | Admitting: *Deleted

## 2013-11-03 DIAGNOSIS — N390 Urinary tract infection, site not specified: Secondary | ICD-10-CM

## 2013-11-03 MED ORDER — CIPROFLOXACIN HCL 500 MG PO TABS: 500.0000 mg | ORAL_TABLET | Freq: Two times a day (BID) | ORAL | Status: DC

## 2013-11-03 NOTE — Telephone Encounter (Signed)
Patient is having urinary tract infection symptoms and she would like something called in because it is the weekend and she is unable to come to give [place] a sample before we close.  We will call in medication for her.  She knows to come in if her symptoms persist so that she can give a sample.

## 2013-12-01 ENCOUNTER — Ambulatory Visit: Payer: PRIVATE HEALTH INSURANCE | Admitting: Cardiovascular Disease

## 2013-12-07 ENCOUNTER — Ambulatory Visit: Payer: PRIVATE HEALTH INSURANCE | Admitting: Cardiovascular Disease

## 2013-12-26 ENCOUNTER — Ambulatory Visit: Payer: PRIVATE HEALTH INSURANCE | Admitting: Cardiovascular Disease

## 2014-01-12 ENCOUNTER — Ambulatory Visit: Payer: PRIVATE HEALTH INSURANCE | Admitting: Cardiovascular Disease

## 2014-01-23 ENCOUNTER — Encounter: Payer: Self-pay | Admitting: Cardiovascular Disease

## 2014-01-23 ENCOUNTER — Ambulatory Visit (INDEPENDENT_AMBULATORY_CARE_PROVIDER_SITE_OTHER): Payer: PRIVATE HEALTH INSURANCE | Admitting: Cardiovascular Disease

## 2014-01-23 VITALS — BP 118/90 | HR 68 | Ht 66.0 in | Wt 132.0 lb

## 2014-01-23 DIAGNOSIS — E785 Hyperlipidemia, unspecified: Secondary | ICD-10-CM

## 2014-01-23 DIAGNOSIS — R011 Cardiac murmur, unspecified: Secondary | ICD-10-CM | POA: Insufficient documentation

## 2014-01-23 DIAGNOSIS — R748 Abnormal levels of other serum enzymes: Secondary | ICD-10-CM

## 2014-01-23 DIAGNOSIS — R9431 Abnormal electrocardiogram [ECG] [EKG]: Secondary | ICD-10-CM | POA: Insufficient documentation

## 2014-01-23 DIAGNOSIS — I10 Essential (primary) hypertension: Secondary | ICD-10-CM

## 2014-01-23 MED ORDER — ATORVASTATIN CALCIUM 40 MG PO TABS: 40.0000 mg | ORAL_TABLET | Freq: Every day | ORAL | Status: DC

## 2014-01-23 NOTE — Assessment & Plan Note (Signed)
Do not like the fact that she is on 80 mg of zocor  Will change to lipitor 40 mg generic

## 2014-01-23 NOTE — Assessment & Plan Note (Signed)
Well controlled.  Continue current medications and low sodium Dash type diet.    

## 2014-01-23 NOTE — Addendum Note (Signed)
Addended by: Devra Dopp E on: 01/23/2014 10:00 AM   Modules accepted: Orders

## 2014-01-23 NOTE — Assessment & Plan Note (Signed)
Should not be on 80 mg of zocor increased risk of toxicity Change to lipitor 40 mg generic  F/U labs 8 weeks

## 2014-01-23 NOTE — Assessment & Plan Note (Signed)
Echo to assess LAE and yearly ECG given RBBB

## 2014-01-23 NOTE — Patient Instructions (Addendum)
Your physician wants you to follow-up in:  YEAR WIT H DR Johnsie Cancel  You will receive a reminder letter in the mail two months in advance. If you don't receive a letter, please call our office to schedule the follow-up appointment.  Your physician has recommended you make the following change in your medication: STOP  SIMVASTATIN   START  ATORVASTATIN 40 MG EVERY DAY  Your physician recommends that you return for lab work in:  [place] has requested that you have an echocardiogram. Echocardiography is a painless test that uses sound waves to create images of your heart. It provides your doctor with information about the size and shape of your heart and how well your heart's chambers and valves are working. This procedure takes approximately one hour. There are no restrictions for this procedure.   [place]  [phone]

## 2014-01-23 NOTE — Progress Notes (Signed)
Patient ID: Kathryn Chavez, female   DOB: 02-Apr-1951, 63 y.o.   MRN: [id number]    63 yo self referred. No primary sees OB/Gyn for things  Has elevated lipids on Rx  History of murmur  Has not had it evaluated in years.  OB doctor heard it and suggested Evaluation.  She is active works at the [employer] and her granddaughter Lovena Le knows my son.  Denies history of diet suppressants , rheumatic fever, SBE.  She has not had any chest pain, dypsnea palpitations or synocope   05/02/13  LDL was 125 with normal LFTls    ROS: Denies fever, malais, weight loss, blurry vision, decreased visual acuity, cough, sputum, SOB, hemoptysis, pleuritic pain, palpitaitons, heartburn, abdominal pain, melena, lower extremity edema, claudication, or rash.  All other systems reviewed and negative   General: Affect appropriate Healthy:  appears stated age 29: normal Neck supple with no adenopathy JVP normal no bruits no thyromegaly Lungs clear with no wheezing and good diaphragmatic motion Heart:  S1/S2 3/6 SEM aortic area no ,rub, gallop or click PMI normal Abdomen: benighn, BS positve, no tenderness, no AAA no bruit.  No HSM or HJR Distal pulses intact with no bruits No edema Neuro non-focal Skin warm and dry No muscular weakness  Medications Current Outpatient Prescriptions  Medication Sig Dispense Refill  . aspirin 81 MG tablet Take 81 mg by mouth daily.        Marland Kitchen augmented betamethasone dipropionate (DIPROLENE-AF) 0.05 % cream       . azithromycin (ZITHROMAX) 250 MG tablet Take 1 tablet (250 mg total) by mouth once. Take two tablets on day one  6 tablet  0  . ciprofloxacin (CIPRO) 500 MG tablet Take 1 tablet (500 mg total) by mouth 2 (two) times daily.  6 tablet  0  . fexofenadine (ALLEGRA) 180 MG tablet Take 1 tablet (180 mg total) by mouth daily.  90 tablet  3  . mometasone (NASONEX) 50 MCG/ACT nasal spray Place 2 sprays into the nose daily.  17 g  11  . simvastatin (ZOCOR) 80 MG tablet Take  1 tablet (80 mg total) by mouth daily.  30 tablet  12   No current facility-administered medications for this visit.    Allergies Penicillins and Sulfonamide derivatives  Family History: Family History  Problem Relation Age of Onset  . Arthritis Other   . Hyperlipidemia Other   . Cancer Other     lung, prostate, breast  . Cancer Mother 67    BREAST AND LUNG  . Cancer Father     BRAIN TUMOR  . Cancer Daughter 63    BREAST    Social History: History   Social History  . Marital Status: Single    Spouse Name: N/A    Number of Children: N/A  . Years of Education: N/A   Occupational History  . Not on file.   Social History Main Topics  . Smoking status: Never Smoker   . Smokeless tobacco: Never Used  . Alcohol Use: No  . Drug Use: No  . Sexual Activity: Yes    Partners: Male    Birth Control/ Protection: Surgical   Other Topics Concern  . Not on file   Social History Narrative  . No narrative on file    Electrocardiogram:  SR rate 68  LAE Low voltage ICRBBB   Assessment and Plan

## 2014-01-23 NOTE — Assessment & Plan Note (Signed)
AV sclerosis murmur  Given loudness will order echo for baseline

## 2014-01-26 ENCOUNTER — Ambulatory Visit (HOSPITAL_COMMUNITY): Payer: PRIVATE HEALTH INSURANCE | Attending: Cardiovascular Disease | Admitting: Radiology

## 2014-01-26 DIAGNOSIS — R011 Cardiac murmur, unspecified: Secondary | ICD-10-CM | POA: Diagnosis not present

## 2014-01-26 NOTE — Progress Notes (Signed)
Echocardiogram performed.  

## 2014-02-01 ENCOUNTER — Telehealth: Payer: Self-pay | Admitting: Cardiovascular Disease

## 2014-02-01 NOTE — Telephone Encounter (Signed)
**Note De-Identified [place] Obfuscation** The pt is given the number for [place] [phone]. She verbalized understanding and thanked me for my help.

## 2014-02-01 NOTE — Telephone Encounter (Signed)
New message     Pt was seen last week.  Dr Johnsie Cancel recommended pt see a PCP---she forgot who he recommended.  Please call with PCP name---OK to leave on vm

## 2014-03-26 ENCOUNTER — Other Ambulatory Visit: Payer: PRIVATE HEALTH INSURANCE

## 2014-04-17 ENCOUNTER — Other Ambulatory Visit: Payer: PRIVATE HEALTH INSURANCE

## 2014-05-01 ENCOUNTER — Other Ambulatory Visit (INDEPENDENT_AMBULATORY_CARE_PROVIDER_SITE_OTHER): Payer: PPO | Admitting: *Deleted

## 2014-05-01 DIAGNOSIS — E785 Hyperlipidemia, unspecified: Secondary | ICD-10-CM

## 2014-05-01 DIAGNOSIS — R748 Abnormal levels of other serum enzymes: Secondary | ICD-10-CM

## 2014-05-01 LAB — HEPATIC FUNCTION PANEL
ALBUMIN: 4.2 g/dL (ref 3.5–5.2)
ALK PHOS: 76 U/L (ref 39–117)
ALT: 18 U/L (ref 0–35)
AST: 29 U/L (ref 0–37)
BILIRUBIN DIRECT: 0 mg/dL (ref 0.0–0.3)
TOTAL PROTEIN: 6.4 g/dL (ref 6.0–8.3)
Total Bilirubin: 0.8 mg/dL (ref 0.2–1.2)

## 2014-05-01 LAB — LIPID PANEL
CHOL/HDL RATIO: 2
Cholesterol: 230 mg/dL — ABNORMAL HIGH (ref 0–200)
HDL: 110.8 mg/dL (ref >39.00)
LDL CALC: 106 mg/dL — AB (ref 0–99)
NonHDL: 119.2
TRIGLYCERIDES: 68 mg/dL (ref 0.0–149.0)
VLDL: 13.6 mg/dL (ref 0.0–40.0)

## 2014-05-08 ENCOUNTER — Telehealth: Payer: Self-pay | Admitting: Cardiovascular Disease

## 2014-05-08 DIAGNOSIS — Z79899 Other long term (current) drug therapy: Secondary | ICD-10-CM

## 2014-05-08 DIAGNOSIS — E785 Hyperlipidemia, unspecified: Secondary | ICD-10-CM

## 2014-05-08 NOTE — Telephone Encounter (Signed)
Left message to call back  

## 2014-05-08 NOTE — Telephone Encounter (Signed)
Patient states she has been taking Lipitor for approx 3 months now. Has developed joint pain in knees and toes. Had been on Lipitor years ago and had these symptoms then. Was also on Simvastatin in the past, but not 80 mg. Please advise.

## 2014-05-08 NOTE — Telephone Encounter (Signed)
New Msg      Pt c/o medication issue: 1. Name of Medication: Lipitor 2. How are you currently taking this medication (dosage and times per day)? 40 mg once a day 3. Are you having a reaction (difficulty breathing--STAT)?  No difficulty breathing  4. What is your medication issue? Causing pt severe joint pain in toes and knees    Please return pt call, requesting a new medication to take.

## 2014-05-08 NOTE — Telephone Encounter (Signed)
Can try crestor 5 mg and f/u labs chol/LFTls in 3 months

## 2014-05-09 MED ORDER — ROSUVASTATIN CALCIUM 5 MG PO TABS: 5.0000 mg | ORAL_TABLET | Freq: Every day | ORAL | Status: DC

## 2014-05-09 NOTE — Telephone Encounter (Signed)
Left message.   To call back.

## 2014-05-09 NOTE — Telephone Encounter (Signed)
Patient is agreeable to starting Crestor 5 mg daily. Samples given for 3 weeks Rx sent to [facility]. Will return for lipid/lft's on 08/08/2014.

## 2014-05-09 NOTE — Addendum Note (Signed)
Addended by: Brett Canales on: 05/09/2014 09:36 AM   Modules accepted: Orders, Medications

## 2014-05-23 ENCOUNTER — Other Ambulatory Visit: Payer: Self-pay | Admitting: Obstetrics & Gynecology

## 2014-06-21 ENCOUNTER — Telehealth: Payer: Self-pay | Admitting: Cardiovascular Disease

## 2014-06-21 NOTE — Telephone Encounter (Signed)
Agree that Praluent will not be cheaper and failing drug due to cost is not a valid reason for them to cover it.  Would suggest patient contact the Patient Assistance Network to see if she qualifies for coverage.  [phone].  It is a fund specifically for Part D patients.

## 2014-06-21 NOTE — Telephone Encounter (Signed)
Calling stating she has called several pharmacy's to see what cost of Crestor would be.  Advised the cheapest she could find was $45 a month and she can't afford that because it is not generic.  Wants to know if Dr. Johnsie Cancel would recommend another medication.  States she took Simvastatin in past but was stopped because it didn't control her cholesterol.  Advised will forward to Dr. Johnsie Cancel for his recommendations.

## 2014-06-21 NOTE — Telephone Encounter (Signed)
She indicates having issues with lipitor in past and this is the only other stating nearly as strong as crestor Zocor hasn't worked well enough for her  Can refer to lipid clinic  I doubt that praluent would be cheaper

## 2014-06-21 NOTE — Telephone Encounter (Signed)
New message      Ins is now paying 45.00 a month for crestor----pt cannot afford this---please call in [facility] presc [facility] [address].  Something that is cheaper.  Please let pt know what he decide.  She is out of crestor.

## 2014-06-21 NOTE — Telephone Encounter (Signed)
Notified that Dr. Johnsie Cancel would like to refer her to Lipid clinic since Lipitor and Zocor hasn't worked for her.  Advised someone will call her with that appointment.

## 2014-06-26 DIAGNOSIS — L84 Corns and callosities: Secondary | ICD-10-CM | POA: Insufficient documentation

## 2014-06-26 DIAGNOSIS — L821 Other seborrheic keratosis: Secondary | ICD-10-CM | POA: Insufficient documentation

## 2014-06-26 DIAGNOSIS — D369 Benign neoplasm, unspecified site: Secondary | ICD-10-CM | POA: Insufficient documentation

## 2014-06-29 ENCOUNTER — Telehealth: Payer: Self-pay

## 2014-06-29 ENCOUNTER — Other Ambulatory Visit: Payer: Self-pay | Admitting: *Deleted

## 2014-06-29 DIAGNOSIS — E785 Hyperlipidemia, unspecified: Secondary | ICD-10-CM

## 2014-06-29 NOTE — Telephone Encounter (Signed)
LVM for pt to call back.   RE: Flu Vaccine for 2015/2016 season 

## 2014-07-05 NOTE — Telephone Encounter (Signed)
Pt called not flu shot and is not going to get one

## 2014-07-23 ENCOUNTER — Telehealth: Payer: Self-pay | Admitting: Internal Medicine

## 2014-07-23 NOTE — Telephone Encounter (Signed)
Received medical records from [facility], sent to Dr. Ronnald Ramp. 07/23/14/ss.

## 2014-08-08 ENCOUNTER — Other Ambulatory Visit: Payer: Self-pay

## 2014-08-09 ENCOUNTER — Other Ambulatory Visit: Payer: Self-pay

## 2014-08-09 DIAGNOSIS — Z1231 Encounter for screening mammogram for malignant neoplasm of breast: Secondary | ICD-10-CM

## 2014-09-03 ENCOUNTER — Encounter: Payer: Self-pay | Admitting: Internal Medicine

## 2014-09-03 ENCOUNTER — Ambulatory Visit
Admission: RE | Admit: 2014-09-03 | Discharge: 2014-09-03 | Disposition: A | Discharge status: Home / Self Care | Payer: PPO | Source: Ambulatory Visit

## 2014-09-03 DIAGNOSIS — Z1231 Encounter for screening mammogram for malignant neoplasm of breast: Secondary | ICD-10-CM

## 2015-01-16 ENCOUNTER — Ambulatory Visit (INDEPENDENT_AMBULATORY_CARE_PROVIDER_SITE_OTHER): Payer: PPO | Admitting: Internal Medicine

## 2015-01-16 ENCOUNTER — Encounter: Payer: Self-pay | Admitting: Internal Medicine

## 2015-01-16 VITALS — BP 126/80 | HR 78 | Temp 98.2°F | Resp 16 | Ht 65.5 in | Wt 134.0 lb

## 2015-01-16 DIAGNOSIS — R7309 Other abnormal glucose: Secondary | ICD-10-CM | POA: Diagnosis not present

## 2015-01-16 DIAGNOSIS — D539 Nutritional anemia, unspecified: Secondary | ICD-10-CM | POA: Diagnosis not present

## 2015-01-16 DIAGNOSIS — Z1331 Encounter for screening for depression: Secondary | ICD-10-CM

## 2015-01-16 DIAGNOSIS — Z9181 History of falling: Secondary | ICD-10-CM

## 2015-01-16 DIAGNOSIS — E2839 Other primary ovarian failure: Secondary | ICD-10-CM

## 2015-01-16 DIAGNOSIS — I10 Essential (primary) hypertension: Secondary | ICD-10-CM

## 2015-01-16 DIAGNOSIS — Z79899 Other long term (current) drug therapy: Secondary | ICD-10-CM | POA: Diagnosis not present

## 2015-01-16 DIAGNOSIS — Z0001 Encounter for general adult medical examination with abnormal findings: Secondary | ICD-10-CM | POA: Diagnosis not present

## 2015-01-16 DIAGNOSIS — Z1389 Encounter for screening for other disorder: Secondary | ICD-10-CM

## 2015-01-16 DIAGNOSIS — E785 Hyperlipidemia, unspecified: Secondary | ICD-10-CM

## 2015-01-16 DIAGNOSIS — Z23 Encounter for immunization: Secondary | ICD-10-CM | POA: Diagnosis not present

## 2015-01-16 DIAGNOSIS — Z1212 Encounter for screening for malignant neoplasm of rectum: Secondary | ICD-10-CM

## 2015-01-16 DIAGNOSIS — R6889 Other general symptoms and signs: Secondary | ICD-10-CM | POA: Diagnosis not present

## 2015-01-16 DIAGNOSIS — Z789 Other specified health status: Secondary | ICD-10-CM

## 2015-01-16 DIAGNOSIS — M839 Adult osteomalacia, unspecified: Secondary | ICD-10-CM | POA: Diagnosis not present

## 2015-01-16 LAB — BASIC METABOLIC PANEL WITH GFR
BUN: 16 mg/dL (ref 7–25)
CHLORIDE: 105 mmol/L (ref 98–110)
CO2: 27 mmol/L (ref 20–31)
CREATININE: 0.64 mg/dL (ref 0.50–0.99)
Calcium: 10 mg/dL (ref 8.6–10.4)
GFR, Est African American: >89 mL/min (ref >=60)
Glucose, Bld: 76 mg/dL (ref 65–99)
POTASSIUM: 4.1 mmol/L (ref 3.5–5.3)
Sodium: 142 mmol/L (ref 135–146)

## 2015-01-16 LAB — HEPATIC FUNCTION PANEL
ALT: 19 U/L (ref 6–29)
AST: 25 U/L (ref 10–35)
Albumin: 4.5 g/dL (ref 3.6–5.1)
Alkaline Phosphatase: 78 U/L (ref 33–130)
BILIRUBIN DIRECT: 0.1 mg/dL (ref <=0.2)
BILIRUBIN TOTAL: 0.4 mg/dL (ref 0.2–1.2)
Indirect Bilirubin: 0.3 mg/dL (ref 0.2–1.2)
Total Protein: 6.5 g/dL (ref 6.1–8.1)

## 2015-01-16 LAB — CBC WITH DIFFERENTIAL/PLATELET
BASOS ABS: 0.1 10*3/uL (ref 0.0–0.1)
Basophils Relative: 1 % (ref 0–1)
EOS ABS: 0.1 10*3/uL (ref 0.0–0.7)
Eosinophils Relative: 2 % (ref 0–5)
HCT: 39.7 % (ref 36.0–46.0)
HEMOGLOBIN: 13 g/dL (ref 12.0–15.0)
Lymphocytes Relative: 40 % (ref 12–46)
Lymphs Abs: 2.4 10*3/uL (ref 0.7–4.0)
MCH: 31.2 pg (ref 26.0–34.0)
MCHC: 32.7 g/dL (ref 30.0–36.0)
MCV: 95.2 fL (ref 78.0–100.0)
MONOS PCT: 8 % (ref 3–12)
MPV: 9.2 fL (ref 8.6–12.4)
Monocytes Absolute: 0.5 10*3/uL (ref 0.1–1.0)
NEUTROS ABS: 3 10*3/uL (ref 1.7–7.7)
Neutrophils Relative %: 49 % (ref 43–77)
Platelets: 255 10*3/uL (ref 150–400)
RBC: 4.17 MIL/uL (ref 3.87–5.11)
RDW: 13.2 % (ref 11.5–15.5)
WBC: 6.1 10*3/uL (ref 4.0–10.5)

## 2015-01-16 LAB — LIPID PANEL
CHOL/HDL RATIO: 2.5 ratio (ref <=5.0)
CHOLESTEROL: 243 mg/dL — AB (ref 125–200)
HDL: 99 mg/dL (ref >=46)
LDL Cholesterol: 125 mg/dL (ref <130)
TRIGLYCERIDES: 97 mg/dL (ref <150)
VLDL: 19 mg/dL (ref <30)

## 2015-01-16 LAB — IRON AND TIBC
%SAT: 26 % (ref 11–50)
Iron: 88 ug/dL (ref 45–160)
TIBC: 339 ug/dL (ref 250–450)
UIBC: 251 ug/dL (ref 125–400)

## 2015-01-16 LAB — MAGNESIUM: MAGNESIUM: 1.9 mg/dL (ref 1.5–2.5)

## 2015-01-16 MED ORDER — ROSUVASTATIN CALCIUM 10 MG PO TABS: ORAL_TABLET | ORAL | Status: DC

## 2015-01-16 MED ORDER — GABAPENTIN 100 MG PO CAPS: 100.0000 mg | ORAL_CAPSULE | Freq: Three times a day (TID) | ORAL | Status: DC

## 2015-01-16 NOTE — Patient Instructions (Signed)
Gabapentin capsules or tablets What is this medicine? GABAPENTIN (GA ba pen tin) is used to control partial seizures in adults with epilepsy. It is also used to treat certain types of nerve pain. This medicine may be used for other purposes; ask your health care provider or pharmacist if you have questions. COMMON BRAND NAME(S): Orpha Bur, Neurontin What should I tell my health care provider before I take this medicine? They need to know if you have any of these conditions: -kidney disease -suicidal thoughts, plans, or attempt; a previous suicide attempt by you or a family member -an unusual or allergic reaction to gabapentin, other medicines, foods, dyes, or preservatives -pregnant or trying to get pregnant -breast-feeding How should I use this medicine? Take this medicine by mouth with a glass of water. Follow the directions on the prescription label. You can take it with or without food. If it upsets your stomach, take it with food.Take your medicine at regular intervals. Do not take it more often than directed. Do not stop taking except on your doctor's advice. If you are directed to break the 600 or 800 mg tablets in half as part of your dose, the extra half tablet should be used for the next dose. If you have not used the extra half tablet within 28 days, it should be thrown away. A special MedGuide will be given to you by the pharmacist with each prescription and refill. Be sure to read this information carefully each time. Talk to your pediatrician regarding the use of this medicine in children. Special care may be needed. Overdosage: If you think you have taken too much of this medicine contact a poison control center or emergency room at once. NOTE: This medicine is only for you. Do not share this medicine with others. What if I miss a dose? If you miss a dose, take it as soon as you can. If it is almost time for your next dose, take only that dose. Do not take double or extra  doses. What may interact with this medicine? Do not take this medicine with any of the following medications: -other gabapentin products This medicine may also interact with the following medications: -alcohol -antacids -antihistamines for allergy, cough and cold -certain medicines for anxiety or sleep -certain medicines for depression or psychotic disturbances -homatropine; hydrocodone -naproxen -narcotic medicines (opiates) for pain -phenothiazines like chlorpromazine, mesoridazine, prochlorperazine, thioridazine This list may not describe all possible interactions. Give your health care provider a list of all the medicines, herbs, non-prescription drugs, or dietary supplements you use. Also tell them if you smoke, drink alcohol, or use illegal drugs. Some items may interact with your medicine. What should I watch for while using this medicine? Visit your doctor or health care professional for regular checks on your progress. You may want to keep a record at home of how you feel your condition is responding to treatment. You may want to share this information with your doctor or health care professional at each visit. You should contact your doctor or health care professional if your seizures get worse or if you have any new types of seizures. Do not stop taking this medicine or any of your seizure medicines unless instructed by your doctor or health care professional. Stopping your medicine suddenly can increase your seizures or their severity. Wear a medical identification bracelet or chain if you are taking this medicine for seizures, and carry a card that lists all your medications. You may get drowsy, dizzy, or have blurred  vision. Do not drive, use machinery, or do anything that needs mental alertness until you know how this medicine affects you. To reduce dizzy or fainting spells, do not sit or stand up quickly, especially if you are an older patient. Alcohol can increase drowsiness and  dizziness. Avoid alcoholic drinks. Your mouth may get dry. Chewing sugarless gum or sucking hard candy, and drinking plenty of water will help. The use of this medicine may increase the chance of suicidal thoughts or actions. Pay special attention to how you are responding while on this medicine. Any worsening of mood, or thoughts of suicide or dying should be reported to your health care professional right away. Women who become pregnant while using this medicine may enroll in the [place] Pregnancy Registry by calling [phone]. This registry collects information about the safety of antiepileptic drug use during pregnancy. What side effects may I notice from receiving this medicine? Side effects that you should report to your doctor or health care professional as soon as possible: -allergic reactions like skin rash, itching or hives, swelling of the face, lips, or tongue -worsening of mood, thoughts or actions of suicide or dying Side effects that usually do not require medical attention (report to your doctor or health care professional if they continue or are bothersome): -constipation -difficulty walking or controlling muscle movements -dizziness -nausea -slurred speech -tiredness -tremors -weight gain This list may not describe all possible side effects. Call your doctor for medical advice about side effects. You may report side effects to FDA at 1-800-FDA-1088. Where should I keep my medicine? Keep out of reach of children. Store at room temperature between 15 and 30 degrees C (59 and 86 degrees F). Throw away any unused medicine after the expiration date. NOTE: This sheet is a summary. It may not cover all possible information. If you have questions about this medicine, talk to your doctor, pharmacist, or health care provider.  2015, [employer] Standard. (2012-12-08 09:12:48)   Preventive Care for Adults  A healthy lifestyle and preventive care can  promote health and wellness. Preventive health guidelines for women include the following key practices.  A routine yearly physical is a good way to check with your health care provider about your health and preventive screening. It is a chance to share any concerns and updates on your health and to receive a thorough exam.  Visit your dentist for a routine exam and preventive care every 6 months. Brush your teeth twice a day and floss once a day. Good oral hygiene prevents tooth decay and gum disease.  The frequency of eye exams is based on your age, health, family medical history, use of contact lenses, and other factors. Follow your health care provider's recommendations for frequency of eye exams.  Eat a healthy diet. Foods like vegetables, fruits, whole grains, low-fat dairy products, and lean protein foods contain the nutrients you need without too many calories. Decrease your intake of foods high in solid fats, added sugars, and salt. Eat the right amount of calories for you.Get information about a proper diet from your health care provider, if necessary.  Regular physical exercise is one of the most important things you can do for your health. Most adults should get at least 150 minutes of moderate-intensity exercise (any activity that increases your heart rate and causes you to sweat) each week. In addition, most adults need muscle-strengthening exercises on 2 or more days a week.  Maintain a healthy weight. The body mass index (  BMI) is a screening tool to identify possible weight problems. It provides an estimate of body fat based on height and weight. Your health care provider can find your BMI and can help you achieve or maintain a healthy weight.For adults 20 years and older:  A BMI below 18.5 is considered underweight.  A BMI of 18.5 to 24.9 is normal.  A BMI of 25 to 29.9 is considered overweight.  A BMI of 30 and above is considered obese.  Maintain normal blood lipids and  cholesterol levels by exercising and minimizing your intake of saturated fat. Eat a balanced diet with plenty of fruit and vegetables. If your lipid or cholesterol levels are high, you are over 50, or you are at high risk for heart disease, you may need your cholesterol levels checked more frequently.Ongoing high lipid and cholesterol levels should be treated with medicines if diet and exercise are not working.  If you smoke, find out from your health care provider how to quit. If you do not use tobacco, do not start.  Lung cancer screening is recommended for adults aged 83-80 years who are at high risk for developing lung cancer because of a history of smoking. A yearly low-dose CT scan of the lungs is recommended for people who have at least a 30-pack-year history of smoking and are a current smoker or have quit within the past 15 years. A pack year of smoking is smoking an average of 1 pack of cigarettes a day for 1 year (for example: 1 pack a day for 30 years or 2 packs a day for 15 years). Yearly screening should continue until the smoker has stopped smoking for at least 15 years. Yearly screening should be stopped for people who develop a health problem that would prevent them from having lung cancer treatment.  Avoid use of street drugs. Do not share needles with anyone. Ask for help if you need support or instructions about stopping the use of drugs.  High blood pressure causes heart disease and increases the risk of stroke.  Ongoing high blood pressure should be treated with medicines if weight loss and exercise do not work.  If you are 81-22 years old, ask your health care provider if you should take aspirin to prevent strokes.  Diabetes screening involves taking a blood sample to check your fasting blood sugar level. This should be done once every 3 years, after age 60, if you are within normal weight and without risk factors for diabetes. Testing should be considered at a younger age or be  carried out more frequently if you are overweight and have at least 1 risk factor for diabetes.  Breast cancer screening is essential preventive care for women. You should practice "breast self-awareness." This means understanding the normal appearance and feel of your breasts and may include breast self-examination. Any changes detected, no matter how small, should be reported to a health care provider. Women in their 32s and 30s should have a clinical breast exam (CBE) by a health care provider as part of a regular health exam every 1 to 3 years. After age 76, women should have a CBE every year. Starting at age 60, women should consider having a mammogram (breast X-ray test) every year. Women who have a family history of breast cancer should talk to their health care provider about genetic screening. Women at a high risk of breast cancer should talk to their health care providers about having an MRI and a mammogram every year.  Breast cancer gene (BRCA)-related cancer risk assessment is recommended for women who have family members with BRCA-related cancers. BRCA-related cancers include breast, ovarian, tubal, and peritoneal cancers. Having family members with these cancers may be associated with an increased risk for harmful changes (mutations) in the breast cancer genes BRCA1 and BRCA2. Results of the assessment will determine the need for genetic counseling and BRCA1 and BRCA2 testing.  Routine pelvic exams to screen for cancer are no longer recommended for nonpregnant women who are considered low risk for cancer of the pelvic organs (ovaries, uterus, and vagina) and who do not have symptoms. Ask your health care provider if a screening pelvic exam is right for you.  If you have had past treatment for cervical cancer or a condition that could lead to cancer, you need Pap tests and screening for cancer for at least 20 years after your treatment. If Pap tests have been discontinued, your risk factors  (such as having a new sexual partner) need to be reassessed to determine if screening should be resumed. Some women have medical problems that increase the chance of getting cervical cancer. In these cases, your health care provider may recommend more frequent screening and Pap tests.    Colorectal cancer can be detected and often prevented. Most routine colorectal cancer screening begins at the age of 77 years and continues through age 55 years. However, your health care provider may recommend screening at an earlier age if you have risk factors for colon cancer. On a yearly basis, your health care provider may provide home test kits to check for hidden blood in the stool. Use of a small camera at the end of a tube, to directly examine the colon (sigmoidoscopy or colonoscopy), can detect the earliest forms of colorectal cancer. Talk to your health care provider about this at age 9, when routine screening begins. Direct exam of the colon should be repeated every 5-10 years through age 54 years, unless early forms of pre-cancerous polyps or small growths are found.  Osteoporosis is a disease in which the bones lose minerals and strength with aging. This can result in serious bone fractures or breaks. The risk of osteoporosis can be identified using a bone density scan. Women ages 47 years and over and women at risk for fractures or osteoporosis should discuss screening with their health care providers. Ask your health care provider whether you should take a calcium supplement or vitamin D to reduce the rate of osteoporosis.  Menopause can be associated with physical symptoms and risks. Hormone replacement therapy is available to decrease symptoms and risks. You should talk to your health care provider about whether hormone replacement therapy is right for you.  Use sunscreen. Apply sunscreen liberally and repeatedly throughout the day. You should seek shade when your shadow is shorter than you. Protect  yourself by wearing long sleeves, pants, a wide-brimmed hat, and sunglasses year round, whenever you are outdoors.  Once a month, do a whole body skin exam, using a mirror to look at the skin on your back. Tell your health care provider of new moles, moles that have irregular borders, moles that are larger than a pencil eraser, or moles that have changed in shape or color.  Stay current with required vaccines (immunizations).  Influenza vaccine. All adults should be immunized every year.  Tetanus, diphtheria, and acellular pertussis (Td, Tdap) vaccine. Pregnant women should receive 1 dose of Tdap vaccine during each pregnancy. The dose should be obtained regardless of the  length of time since the last dose. Immunization is preferred during the 27th-36th week of gestation. An adult who has not previously received Tdap or who does not know her vaccine status should receive 1 dose of Tdap. This initial dose should be followed by tetanus and diphtheria toxoids (Td) booster doses every 10 years. Adults with an unknown or incomplete history of completing a 3-dose immunization series with Td-containing vaccines should begin or complete a primary immunization series including a Tdap dose. Adults should receive a Td booster every 10 years.    Zoster vaccine. One dose is recommended for adults aged 45 years or older unless certain conditions are present.    Pneumococcal 13-valent conjugate (PCV13) vaccine. When indicated, a person who is uncertain of her immunization history and has no record of immunization should receive the PCV13 vaccine. An adult aged 10 years or older who has certain medical conditions and has not been previously immunized should receive 1 dose of PCV13 vaccine. This PCV13 should be followed with a dose of pneumococcal polysaccharide (PPSV23) vaccine. The PPSV23 vaccine dose should be obtained at least 8 weeks after the dose of PCV13 vaccine. An adult aged 46 years or older who has  certain medical conditions and previously received 1 or more doses of PPSV23 vaccine should receive 1 dose of PCV13. The PCV13 vaccine dose should be obtained 1 or more years after the last PPSV23 vaccine dose.    Pneumococcal polysaccharide (PPSV23) vaccine. When PCV13 is also indicated, PCV13 should be obtained first. All adults aged 71 years and older should be immunized. An adult younger than age 47 years who has certain medical conditions should be immunized. Any person who resides in a nursing home or long-term care facility should be immunized. An adult smoker should be immunized. People with an immunocompromised condition and certain other conditions should receive both PCV13 and PPSV23 vaccines. People with human immunodeficiency virus (HIV) infection should be immunized as soon as possible after diagnosis. Immunization during chemotherapy or radiation therapy should be avoided. Routine use of PPSV23 vaccine is not recommended for American Indians, [place] Natives, or people younger than 65 years unless there are medical conditions that require PPSV23 vaccine. When indicated, people who have unknown immunization and have no record of immunization should receive PPSV23 vaccine. One-time revaccination 5 years after the first dose of PPSV23 is recommended for people aged 19-64 years who have chronic kidney failure, nephrotic syndrome, asplenia, or immunocompromised conditions. People who received 1-2 doses of PPSV23 before age 65 years should receive another dose of PPSV23 vaccine at age 87 years or later if at least 5 years have passed since the previous dose. Doses of PPSV23 are not needed for people immunized with PPSV23 at or after age 4 years.   Preventive Services / Frequency  Ages 16 years and over  Blood pressure check.  Lipid and cholesterol check.  Lung cancer screening. / Every year if you are aged 91-80 years and have a 30-pack-year history of smoking and currently smoke or have quit  within the past 15 years. Yearly screening is stopped once you have quit smoking for at least 15 years or develop a health problem that would prevent you from having lung cancer treatment.  Clinical breast exam.** / Every year after age 53 years.  BRCA-related cancer risk assessment.** / For women who have family members with a BRCA-related cancer (breast, ovarian, tubal, or peritoneal cancers).  Mammogram.** / Every year beginning at age 80 years and continuing for  as long as you are in good health. Consult with your health care provider.  Pap test.** / Every 3 years starting at age 59 years through age 74 or 90 years with 3 consecutive normal Pap tests. Testing can be stopped between 65 and 70 years with 3 consecutive normal Pap tests and no abnormal Pap or HPV tests in the past 10 years.  Fecal occult blood test (FOBT) of stool. / Every year beginning at age 90 years and continuing until age 54 years. You may not need to do this test if you get a colonoscopy every 10 years.  Flexible sigmoidoscopy or colonoscopy.** / Every 5 years for a flexible sigmoidoscopy or every 10 years for a colonoscopy beginning at age 68 years and continuing until age 82 years.  Hepatitis C blood test.** / For all people born from [birth year] through 1966 and any individual with known risks for hepatitis C.  Osteoporosis screening.** / A one-time screening for women ages 70 years and over and women at risk for fractures or osteoporosis.  Skin self-exam. / Monthly.  Influenza vaccine. / Every year.  Tetanus, diphtheria, and acellular pertussis (Tdap/Td) vaccine.** / 1 dose of Td every 10 years.  Zoster vaccine.** / 1 dose for adults aged 68 years or older.  Pneumococcal 13-valent conjugate (PCV13) vaccine.** / Consult your health care provider.  Pneumococcal polysaccharide (PPSV23) vaccine.** / 1 dose for all adults aged 85 years and older. Screening for abdominal aortic aneurysm (AAA)  by ultrasound is recommended  for people who have history of high blood pressure or who are current or former smokers.

## 2015-01-16 NOTE — Progress Notes (Signed)
Patient ID: TARAH BUBOLTZ, female   DOB: 1950/08/06, 64 y.o.   MRN: [id number]   [facility] VISIT AND CPE  Assessment:    1. Essential hypertension  - Urinalysis, Routine w reflex microscopic (not at [facility]) - Microalbumin / creatinine urine ratio - [place], RETROPERITNL ABD,  LTD - TSH  2. Vitamin D deficient osteomalacia  - Vit D  25 hydroxy (rtn osteoporosis monitoring)  3. Hyperlipidemia  - Lipid panel  4. Medication management  - CBC with Differential/Platelet - BASIC METABOLIC PANEL WITH GFR - Hepatic function panel - Magnesium  5. Abnormal glucose  - Hemoglobin A1c - Insulin, random  6. Deficiency anemia  - Iron and TIBC - Vitamin B12  7. Screening for rectal cancer  - POC Hemoccult Bld/Stl (3-Cd Home Screen); Future  8. Need for prophylactic vaccination against Streptococcus pneumoniae (pneumococcus)  - Pneumococcal polysaccharide vaccine 23-valent greater than or equal to 2yo subcutaneous/IM  9. Estrogen deficiency  - DG Bone Density; Future  10.  Post Herpetic neuralgia -start 100 mg gabapentin, gradually increase up to TID.    Over 40 minutes of exam, counseling, chart review and critical decision making was performed  Plan:   During the course of the visit the patient was educated and counseled about appropriate screening and preventive services including:    Pneumococcal vaccine   Prevnar 13  Influenza vaccine  Td vaccine  Screening electrocardiogram  Bone densitometry screening  Colorectal cancer screening  Diabetes screening  Glaucoma screening  Nutrition counseling   Advanced directives: requested  Conditions/risks identified: BMI: Discussed weight loss, diet, and increase physical activity.  Increase physical activity: AHA recommends 150 minutes of physical activity a week.  Medications reviewed Diabetes is at goal, ACE/ARB therapy: No, Reason not on Ace Inhibitor/ARB therapy:  not indicated Urinary  Incontinence is not an issue: discussed non pharmacology and pharmacology options.  Fall risk: low- discussed PT, home fall assessment, medications.    Subjective:  SHARVI MOONEYHAN is a 64 y.o. female who presents for Medicare Annual Wellness Visit and complete physical and establishment as a new patient.  Patient was previously seen by Dr. Ronnald Ramp at [facility].  She has been without a PCP for the last year.  She does have a remote history of breast cancer which was treated with chemo and radiation..  Wellness visit has never been done in the past.     She has had elevated blood pressure which is managed by diet and exercise alone. Her blood pressure has been controlled at home, today their BP is BP: 126/80 mmHg  She does not workout. She denies chest pain, shortness of breath, dizziness. She does have some shortness of breath occasionally but she is followed by Dr. Johnsie Cancel for this.    She is on cholesterol medication and denies myalgias. Her cholesterol is not at goal. The cholesterol last visit was:   Lab Results  Component Value Date   CHOL 230* 05/01/2014   HDL 110.80 05/01/2014   LDLCALC 106* 05/01/2014   LDLDIRECT 73.3 04/30/2011   TRIG 68.0 05/01/2014   CHOLHDL 2 05/01/2014  She does have high cholesterol and she is followed in the lipid clinic for this.  She reports that she is taking cholesterol medication.     She has never had a history of prediabetes that she can recall. She has not been working on diet and exercise for prediabetes, and denies foot ulcerations, hyperglycemia, hypoglycemia , increased appetite, nausea, paresthesia of the  feet, polydipsia, polyuria, visual disturbances, vomiting and weight loss. Last A1C in the office was: No results found for: HGBA1C    Patient is on Vitamin D supplement.   No results found for: VD25OH    Patient does admit to a history of shingles on the left side of her face approximately 3 years ago. She reports that she has a lot of  tingling numbness, and pain on that side of her face secondary to this.  She feels that this is preventing her from sleeping well.    Medication Review: Current Outpatient Prescriptions on File Prior to Visit  Medication Sig Dispense Refill  . aspirin 81 MG tablet Take 81 mg by mouth daily.      . fexofenadine (ALLEGRA) 180 MG tablet Take 1 tablet (180 mg total) by mouth daily. 90 tablet 3  . mometasone (NASONEX) 50 MCG/ACT nasal spray Place 2 sprays into the nose daily. 17 g 11  . rosuvastatin (CRESTOR) 5 MG tablet Take 1 tablet (5 mg total) by mouth daily. 30 tablet 11   No current facility-administered medications on file prior to visit.    Current Problems (verified) Patient Active Problem List   Diagnosis Date Noted  . Hyperlipidemia 01/16/2015  . Elevated cholesterol 01/23/2014  . Systolic murmur 40/81/4481  . Abnormal ECG 01/23/2014  . Shingles 05/02/2013  . Acute sinusitis, unspecified 08/18/2011  . Other screening mammogram 04/30/2011  . Vitamin D deficient osteomalacia 10/28/2010  . Elevated liver enzymes 12/16/2009  . Essential hypertension 12/16/2009  . ALLERGIC RHINITIS 12/16/2009  . BREAST CANCER, HX OF 12/16/2009    Screening Tests Immunization History  Administered Date(s) Administered  . Influenza Split 02/26/2011, 03/11/2012  . Influenza,inj,Quad PF,36+ Mos 05/02/2013  . Tdap 10/28/2010    Preventative care: Last colonoscopy: 2013 [facility] cancer history Last pap smear/pelvic exam: She hasn't had one, history of hysterectomy DEXA:Has not had one recently.    Results for orders placed during the hospital encounter of 07/15/12  MM Digital Screening   Narrative *RADIOLOGY REPORT*  Clinical Data: Screening. History of right lumpectomy for breast cancer 2001.  DIGITAL BILATERAL SCREENING MAMMOGRAM WITH CAD DIGITAL BREAST TOMOSYNTHESIS  Digital breast tomosynthesis images are acquired in  two projections.  These images are reviewed in combination with the digital mammogram, confirming the findings below.  Comparison:  Previous exams.  FINDINGS:  ACR Breast Density Category 3: The breast tissue is heterogeneously dense.  No suspicious masses, architectural distortion, or calcifications are present.  Images were processed with CAD.  IMPRESSION: No mammographic evidence of malignancy.  A result letter of this screening mammogram will be mailed directly to the patient.  RECOMMENDATION: Screening mammogram in one year. (Code:SM-B-01Y)  BI-RADS CATEGORY 1:  Negative.   Original Report Authenticated By: Conchita Paris, M.D.     Prior vaccinations: TD or Tdap: 2012  Influenza: 2015  Pneumococcal: due today Prevnar13: not due Shingles/Zostavax: Going to check with insurance   Names of Other Physician/Practitioners you currently use: 1. [facility] here for primary care 2. Dr. [place], eye doctor, last visit today 3. Dr. Renne Musca , dentist, last visit 2016 Patient Care Team: Janith Lima, MD as PCP - General Heather [place], OD (Optometry)  SURGICAL HISTORY Past Surgical History  Procedure Laterality Date  . Abdominal hysterectomy    . Breast lumpectomy     FAMILY HISTORY Family History  Problem Relation Age of Onset  . Arthritis Other   .  Hyperlipidemia Other   . Cancer Other     lung, prostate, breast  . Cancer Mother 67    BREAST AND LUNG  . Cancer Father     BRAIN TUMOR  . Cancer Daughter 67    BREAST   SOCIAL HISTORY Social History  Substance Use Topics  . Smoking status: Never Smoker   . Smokeless tobacco: Never Used  . Alcohol Use: No    MEDICARE WELLNESS OBJECTIVES: Tobacco use: She does not smoke.  Patient is not a former smoker. If yes, counseling given Alcohol Current alcohol use: glass of wine with dinner Osteoporosis: postmenopausal estrogen deficiency, History of fracture in the past  year: no Fall risk: Minimal risk Hearing: normal Visual acuity: normal,  does perform annual eye exam Diet: in general, an "unhealthy" diet Physical activity: Current Exercise Habits:: The patient does not participate in regular exercise at present Cardiac risk factors: Cardiac Risk Factors include: dyslipidemia;family history of premature cardiovascular disease;advanced age (>70men, >53 women);hypertension;sedentary lifestyle Depression/mood screen:   Depression screen [facility] 2/9 01/16/2015  Decreased Interest 0  Down, Depressed, Hopeless 0  PHQ - 2 Score 0    ADLs:  In your present state of health, do you have any difficulty performing the following activities: 01/16/2015  Hearing? N  Vision? N  Difficulty concentrating or making decisions? N  Walking or climbing stairs? N  Dressing or bathing? N  Doing errands, shopping? N  Preparing Food and eating ? N  Using the Toilet? N  In the past six months, have you accidently leaked urine? N  Do you have problems with loss of bowel control? N  Managing your Medications? N  Managing your Finances? N  Housekeeping or managing your Housekeeping? N     Cognitive Testing  Alert? Yes  Normal Appearance?Yes  Oriented to person? Yes  Place? Yes   Time? Yes  Recall of three objects?  Yes  Can perform simple calculations? Yes  Displays appropriate judgment?Yes  Can read the correct time from a watch face?Yes  EOL planning: Does patient have an advance directive?: Yes Type of Advance Directive: Healthcare Power of Attorney, Living will Does patient want to make changes to advanced directive?: No - Patient declined Copy of advanced directive(s) in chart?: No - copy requested  Review of Systems  Constitutional: Negative for fever and chills.  HENT: Negative for congestion, ear pain and sore throat.   Eyes: Negative for blurred vision and double vision.  Respiratory: Positive for shortness of breath. Negative for cough and wheezing.    Cardiovascular: Negative for chest pain, palpitations and leg swelling.  Gastrointestinal: Negative for heartburn, nausea, vomiting, diarrhea, constipation, blood in stool and melena.  Genitourinary: Negative for dysuria, urgency and frequency.  Musculoskeletal: Negative.   Neurological: Positive for sensory change. Negative for dizziness, loss of consciousness and headaches.  Psychiatric/Behavioral: Negative for depression. The patient has insomnia. The patient is not nervous/anxious.      Objective:     Today's Vitals   01/16/15 0859  BP: 126/80  Pulse: 78  Temp: 98.2 F (36.8 C)  TempSrc: Temporal  Resp: 16  Height: 5' 5.5" (1.664 m)  Weight: 134 lb (60.782 kg)   Body mass index is 21.95 kg/(m^2).  General appearance: alert, no distress, WD/WN, female HEENT: normocephalic, sclerae anicteric, TMs pearly, nares patent, no discharge or erythema, pharynx normal Oral cavity: MMM, no lesions Neck: supple, no lymphadenopathy, no thyromegaly, no masses Heart: RRR, normal S1, S2, no murmurs Lungs: CTA bilaterally, no  wheezes, rhonchi, or rales Abdomen: +bs, soft, non tender, non distended, no masses, no hepatomegaly, no splenomegaly Musculoskeletal: nontender, no swelling, no obvious deformity Extremities: no edema, no cyanosis, no clubbing Pulses: 2+ symmetric, upper and lower extremities, normal cap refill Neurological: alert, oriented x 3, CN2-12 intact, strength normal upper extremities and lower extremities, sensation normal throughout, DTRs 2+ throughout, no cerebellar signs, gait normal Psychiatric: normal affect, behavior normal, pleasant   Medicare Attestation I have personally reviewed: The patient's medical and social history Their use of alcohol, tobacco or illicit drugs Their current medications and supplements The patient's functional ability including ADLs,fall risks, home safety risks, cognitive, and hearing and visual impairment Diet and physical  activities Evidence for depression or mood disorders  The patient's weight, height, BMI, and visual acuity have been recorded in the chart.  I have made referrals, counseling, and provided education to the patient based on review of the above and I have provided the patient with a written personalized care plan for preventive services.     Starlyn Skeans, PA-C   01/16/2015

## 2015-01-17 LAB — TSH: TSH: 2.764 u[IU]/mL (ref 0.350–4.500)

## 2015-01-17 LAB — URINALYSIS, ROUTINE W REFLEX MICROSCOPIC
BILIRUBIN URINE: NEGATIVE
Glucose, UA: NEGATIVE
Hgb urine dipstick: NEGATIVE
Leukocytes, UA: NEGATIVE
Nitrite: NEGATIVE
PH: 6 (ref 5.0–8.0)
Protein, ur: NEGATIVE
SPECIFIC GRAVITY, URINE: 1.022 (ref 1.001–1.035)

## 2015-01-17 LAB — MICROALBUMIN / CREATININE URINE RATIO
Creatinine, Urine: 150.8 mg/dL
MICROALB UR: 1.2 mg/dL (ref <2.0)
Microalb Creat Ratio: 8 mg/g (ref 0.0–30.0)

## 2015-01-17 LAB — HEMOGLOBIN A1C
Hgb A1c MFr Bld: 5.5 % (ref <5.7)
MEAN PLASMA GLUCOSE: 111 mg/dL (ref <117)

## 2015-01-17 LAB — INSULIN, RANDOM: Insulin: 3.7 u[IU]/mL (ref 2.0–19.6)

## 2015-01-17 LAB — VITAMIN B12: Vitamin B-12: 304 pg/mL (ref 211–911)

## 2015-01-17 LAB — VITAMIN D 25 HYDROXY (VIT D DEFICIENCY, FRACTURES): Vit D, 25-Hydroxy: 25 ng/mL — ABNORMAL LOW (ref 30–100)

## 2015-04-30 ENCOUNTER — Other Ambulatory Visit: Payer: Self-pay | Admitting: Internal Medicine

## 2015-04-30 DIAGNOSIS — H16223 Keratoconjunctivitis sicca, not specified as Sjogren's, bilateral: Secondary | ICD-10-CM | POA: Diagnosis not present

## 2015-04-30 DIAGNOSIS — H5213 Myopia, bilateral: Secondary | ICD-10-CM | POA: Diagnosis not present

## 2015-04-30 DIAGNOSIS — H2513 Age-related nuclear cataract, bilateral: Secondary | ICD-10-CM | POA: Diagnosis not present

## 2015-04-30 DIAGNOSIS — H524 Presbyopia: Secondary | ICD-10-CM | POA: Diagnosis not present

## 2015-04-30 DIAGNOSIS — H52221 Regular astigmatism, right eye: Secondary | ICD-10-CM | POA: Diagnosis not present

## 2015-04-30 MED ORDER — GABAPENTIN 100 MG PO CAPS: 100.0000 mg | ORAL_CAPSULE | Freq: Three times a day (TID) | ORAL | Status: DC

## 2015-05-03 ENCOUNTER — Encounter: Payer: Self-pay | Admitting: Internal Medicine

## 2015-05-03 ENCOUNTER — Ambulatory Visit (INDEPENDENT_AMBULATORY_CARE_PROVIDER_SITE_OTHER): Payer: PPO | Admitting: Internal Medicine

## 2015-05-03 VITALS — BP 138/70 | HR 80 | Temp 98.0°F | Resp 16 | Ht 65.5 in | Wt 137.0 lb

## 2015-05-03 DIAGNOSIS — F418 Other specified anxiety disorders: Secondary | ICD-10-CM | POA: Diagnosis not present

## 2015-05-03 MED ORDER — ALPRAZOLAM ER 0.5 MG PO TB24: 0.5000 mg | ORAL_TABLET | Freq: Every day | ORAL | Status: DC

## 2015-05-03 NOTE — Progress Notes (Signed)
   Subjective:    Patient ID: Kathryn Chavez, female    DOB: 08-21-1950, 65 y.o.   MRN: [id number]  Anxiety Symptoms include nervous/anxious behavior. Patient reports no chest pain, confusion, dizziness, nausea, palpitations, shortness of breath or suicidal ideas.    Patient presents to the office for evaluation of anxiety.  She reports that she has recently found out that her daughter has recently been diagnosed with breast cancer.  She has already lost one daughter to breast cancer.  She reports that she has been very tearful and hasn't been able to see her daughter.  She is sleeping okay.  She is eating fair.  She reports that she couldn't tolerate zoloft in the past.    Review of Systems  Constitutional: Negative for fever, chills and fatigue.  Respiratory: Negative for cough, chest tightness and shortness of breath.   Cardiovascular: Negative for chest pain and palpitations.  Gastrointestinal: Negative for nausea, vomiting, abdominal pain, diarrhea and constipation.  Genitourinary: Negative for dysuria, urgency, frequency, hematuria and difficulty urinating.  Neurological: Negative for dizziness, weakness, light-headedness and headaches.  Psychiatric/Behavioral: Negative for suicidal ideas, confusion, self-injury, dysphoric mood and agitation. The patient is nervous/anxious.        Objective:   Physical Exam  Constitutional: She appears well-developed and well-nourished. No distress.  HENT:  Head: Normocephalic.  Mouth/Throat: Oropharynx is clear and moist. No oropharyngeal exudate.  Eyes: Conjunctivae are normal. No scleral icterus.  Neck: Normal range of motion. Neck supple. No JVD present. No thyromegaly present.  Cardiovascular: Normal rate, regular rhythm, normal heart sounds and intact distal pulses.  Exam reveals no gallop and no friction rub.   No murmur heard. Pulmonary/Chest: Effort normal and breath sounds normal. No respiratory distress. She has no wheezes. She has no  rales. She exhibits no tenderness.  Abdominal: Soft. Bowel sounds are normal. She exhibits no distension and no mass. There is no tenderness. There is no rebound and no guarding.  Lymphadenopathy:    She has no cervical adenopathy.  Skin: Skin is warm and dry. She is not diaphoretic.  Psychiatric: Her speech is normal and behavior is normal. Judgment normal. Her mood appears anxious. Her affect is labile. Cognition and memory are normal. She expresses no homicidal and no suicidal ideation. She expresses no suicidal plans and no homicidal plans.  Vitals reviewed.   Filed Vitals:   05/03/15 1016  BP: 138/70  Pulse: 80  Temp: 98 F (36.7 C)  Resp: 16          Assessment & Plan:     1. Situational anxiety -xanax xr -recommended speaking with her daughter.

## 2015-05-03 NOTE — Patient Instructions (Signed)
Alprazolam extended-relase tablets What is this medicine? ALPRAZOLAM (al PRAY zoe lam) is a benzodiazepine. It is used to treat anxiety and panic attacks. This medicine may be used for other purposes; ask your health care provider or pharmacist if you have questions. What should I tell my health care provider before I take this medicine? They need to know if you have any of these conditions: -an alcohol or drug abuse problem -bipolar disorder, depression, psychosis or other mental health conditions -glaucoma -kidney or liver disease -lung or breathing disease -myasthenia gravis -Parkinson's disease -porphyria -seizures or a history of seizures -suicidal thoughts -an unusual or allergic reaction to alprazolam, other benzodiazepines, foods, dyes, or preservatives -pregnant or trying to get pregnant -breast-feeding How should I use this medicine? Take this medicine by mouth. Follow the directions on the prescription label. Do not cut, crush, chew or divide the tablets. Swallow the tablets whole with a drink of water. Take your medicine at regular intervals. Do not take it more often than directed. Talk to your pediatrician regarding the use of this medicine in children. Special care may be needed. Overdosage: If you think you have taken too much of this medicine contact a poison control center or emergency room at once. NOTE: This medicine is only for you. Do not share this medicine with others. What if I miss a dose? If you miss a dose, take it as soon as you can. If it is almost time for your next dose, take only that dose. Do not take double or extra doses. What may interact with this medicine? Do not take this medicine with any of the following medications: -ketoconazole -itraconazole This medicine may also interact with the following medications: -cimetidine -cyclosporine -ergotamine -female hormones, including contraceptive or birth control pills -grapefruit juice -herbal or  dietary supplements like kava kava, melatonin, dehydroepiandrosterone, DHEA, St. John's Wort or valerian -imatinib, STI-571 -isoniazid -levodopa -medicines for depression, mental problems or psychiatric disturbances -prescription pain medicines -rifampin, rifapentine, or rifabutin -some antibiotics like clarithromycin, erythromycin, troleandomycin -some medicines for high blood pressure or heart problems -some medicines for HIV infection or AIDS -some medicines for seizures like carbamazepine, oxcarbazepine, phenobarbital, phenytoin, primidone This list may not describe all possible interactions. Give your health care provider a list of all the medicines, herbs, non-prescription drugs, or dietary supplements you use. Also tell them if you smoke, drink alcohol, or use illegal drugs. Some items may interact with your medicine. What should I watch for while using this medicine? Visit your doctor or health care professional for regular checks on your progress. Your body can become dependent on this medicine. Ask your doctor or health care professional if you still need to take it. If you have been taking this medicine regularly for some time, do not suddenly stop taking it. You must gradually reduce the dose or you may get severe side effects. Ask your doctor or health care professional for advice. Even after you stop taking this medicine it can still affect your body for several days. You may get drowsy or dizzy. Do not drive, use machinery, or do anything that needs mental alertness until you know how this medicine affects you. To reduce the risk of dizzy and fainting spells, do not stand or sit up quickly, especially if you are an older patient. Alcohol may increase dizziness and drowsiness. Avoid alcoholic drinks. Do not treat yourself for coughs, colds or allergies without asking your doctor or health care professional for advice. Some ingredients can increase possible  side effects. What side  effects may I notice from receiving this medicine? Side effects that you should report to your doctor or health care professional as soon as possible: -allergic reactions like skin rash, itching or hives, swelling of the face, lips, or tongue -confusion, forgetfulness -depression -difficulty sleeping -difficulty speaking -feeling faint or lightheaded, falls -mood changes, excitability or aggressive behavior -muscle cramps -trouble passing urine or change in the amount of urine -unusually weak or tired Side effects that usually do not require medical attention (report to your doctor or health care professional if they continue or are bothersome): -change in sex drive or performance -changes in appetite This list may not describe all possible side effects. Call your doctor for medical advice about side effects. You may report side effects to FDA at 1-800-FDA-1088. Where should I keep my medicine? Keep out of the reach of children. This medicine can be abused. Keep your medicine in a safe place to protect it from theft. Do not share this medicine with anyone. Selling or giving away this medicine is dangerous and against the law. This medicine may cause accidental overdose and death if taken by other adults, children, or pets. Mix any unused medicine with a substance like cat litter or coffee grounds. Then throw the medicine away in a sealed container like a sealed bag or a coffee can with a lid. Do not use the medicine after the expiration date. Store at room temperature between 15 and 30 degrees C (59 and 86 degrees F). NOTE: This sheet is a summary. It may not cover all possible information. If you have questions about this medicine, talk to your doctor, pharmacist, or health care provider.    2016, Elsevier/Gold Standard. (2013-12-26 14:47:04)

## 2015-05-30 ENCOUNTER — Other Ambulatory Visit: Payer: Self-pay | Admitting: Internal Medicine

## 2015-05-30 MED ORDER — ALPRAZOLAM ER 0.5 MG PO TB24: 0.5000 mg | ORAL_TABLET | Freq: Every day | ORAL | Status: DC

## 2015-07-01 ENCOUNTER — Other Ambulatory Visit: Payer: Self-pay | Admitting: *Deleted

## 2015-07-01 MED ORDER — ALPRAZOLAM ER 0.5 MG PO TB24: 0.5000 mg | ORAL_TABLET | Freq: Every day | ORAL | Status: DC

## 2015-07-02 ENCOUNTER — Other Ambulatory Visit: Payer: Self-pay | Admitting: *Deleted

## 2015-07-02 MED ORDER — ALPRAZOLAM ER 0.5 MG PO TB24: 0.5000 mg | ORAL_TABLET | Freq: Every day | ORAL | Status: DC

## 2015-07-09 ENCOUNTER — Encounter: Payer: Self-pay | Admitting: Internal Medicine

## 2015-07-09 ENCOUNTER — Ambulatory Visit (INDEPENDENT_AMBULATORY_CARE_PROVIDER_SITE_OTHER): Payer: PPO | Admitting: Internal Medicine

## 2015-07-09 VITALS — BP 136/78 | HR 74 | Temp 97.8°F | Resp 16 | Ht 65.5 in | Wt 137.0 lb

## 2015-07-09 DIAGNOSIS — J0140 Acute pansinusitis, unspecified: Secondary | ICD-10-CM | POA: Diagnosis not present

## 2015-07-09 MED ORDER — CHLORPHEN-PE-ACETAMINOPHEN 4-10-325 MG PO TABS: 1.0000 | ORAL_TABLET | Freq: Four times a day (QID) | ORAL | Status: DC | PRN

## 2015-07-09 MED ORDER — PREDNISONE 20 MG PO TABS: ORAL_TABLET | ORAL | Status: DC

## 2015-07-09 MED ORDER — AZITHROMYCIN 250 MG PO TABS: ORAL_TABLET | ORAL | Status: DC

## 2015-07-09 NOTE — Progress Notes (Signed)
HPI  Patient presents to the office for evaluation of cough.  It has been going on for 4 days.  Patient reports minimal cough to clear drainage.  They also endorse chills, postnasal drip and nasal congestion, clear rhinorrhea, bilateral ear congestion, dizziness, sore throat.  .  They have tried sudafed and allegra every morning and nasonex this morning.  They report that nothing has worked.  They admits to other sick contacts.  Review of Systems  Constitutional: Positive for chills. Negative for fever and malaise/fatigue.  HENT: Positive for congestion and ear pain. Negative for sore throat.   Respiratory: Negative for cough, shortness of breath and wheezing.   Cardiovascular: Negative for chest pain, leg swelling and PND.  Neurological: Positive for headaches.    PE:  Filed Vitals:   07/09/15 1032  BP: 136/78  Pulse: 74  Temp: 97.8 F (36.6 C)  Resp: 16   General:  Alert and non-toxic, WDWN, NAD HEENT: NCAT, PERLA, EOM normal, no occular discharge or erythema.  Nasal mucosal edema with sinus tenderness to palpation.  Oropharynx clear with minimal oropharyngeal edema and erythema.  Mucous membranes moist and pink. Neck:  Cervical adenopathy Chest:  RRR no MRGs.  Lungs clear to auscultation A&P with no wheezes rhonchi or rales.   Abdomen: +BS x 4 quadrants, soft, non-tender, no guarding, rigidity, or rebound. Skin: warm and dry no rash Neuro: A&Ox4, CN II-XII grossly intact  Assessment and Plan:   1. Acute pansinusitis, recurrence not specified -norel ad -allegra -nasonex -zpak -prednisone -nasal saline

## 2015-07-10 ENCOUNTER — Ambulatory Visit: Payer: Self-pay | Admitting: Internal Medicine

## 2015-07-16 ENCOUNTER — Ambulatory Visit (INDEPENDENT_AMBULATORY_CARE_PROVIDER_SITE_OTHER): Payer: PPO | Admitting: Internal Medicine

## 2015-07-16 ENCOUNTER — Encounter: Payer: Self-pay | Admitting: Internal Medicine

## 2015-07-16 VITALS — BP 138/80 | HR 82 | Temp 98.2°F | Resp 16 | Ht 65.5 in | Wt 136.0 lb

## 2015-07-16 DIAGNOSIS — Z79899 Other long term (current) drug therapy: Secondary | ICD-10-CM

## 2015-07-16 DIAGNOSIS — E785 Hyperlipidemia, unspecified: Secondary | ICD-10-CM | POA: Diagnosis not present

## 2015-07-16 DIAGNOSIS — E7800 Pure hypercholesterolemia, unspecified: Secondary | ICD-10-CM | POA: Diagnosis not present

## 2015-07-16 DIAGNOSIS — F411 Generalized anxiety disorder: Secondary | ICD-10-CM

## 2015-07-16 DIAGNOSIS — M839 Adult osteomalacia, unspecified: Secondary | ICD-10-CM

## 2015-07-16 DIAGNOSIS — I10 Essential (primary) hypertension: Secondary | ICD-10-CM

## 2015-07-16 NOTE — Progress Notes (Signed)
Assessment and Plan:  Hypertension:  -Continue medication,  -monitor blood pressure at home.  -Continue DASH diet.   -Reminder to go to the ER if any CP, SOB, nausea, dizziness, severe HA, changes vision/speech, left arm numbness and tingling, and jaw pain.  Cholesterol: -Continue diet and exercise.  -Check cholesterol.   Pre-diabetes: -Continue diet and exercise.  -Check A1C  Vitamin D Def: -check level -continue medications.   Anxiety -decrease use of xanax xr to only as needed basis  Continue diet and meds as discussed. Further disposition pending results of labs.  HPI 65 y.o. female  presents for 3 month follow up with hypertension, hyperlipidemia, prediabetes and vitamin D.   Her blood pressure has been controlled at home, today their BP is BP: 138/80 mmHg.   She does not workout. She denies chest pain, shortness of breath, dizziness.  She is doing yoga through [employer].  She is also going to start doing some hiking as well.     She is on cholesterol medication and denies myalgias. Her cholesterol is not at goal. The cholesterol last visit was:   Lab Results  Component Value Date   CHOL 243* 01/16/2015   HDL 99 01/16/2015   LDLCALC 125 01/16/2015   LDLDIRECT 73.3 04/30/2011   TRIG 97 01/16/2015   CHOLHDL 2.5 01/16/2015     She has been working on diet and exercise for prediabetes, and denies foot ulcerations, hyperglycemia, hypoglycemia , increased appetite, nausea, paresthesia of the feet, polydipsia, polyuria, visual disturbances, vomiting and weight loss. Last A1C in the office was:  Lab Results  Component Value Date   HGBA1C 5.5 01/16/2015    Patient is on Vitamin D supplement.  Lab Results  Component Value Date   VD25OH 25* 01/16/2015     She reports that she is doing a lot better with her anxiety.  She is still taking xanax every day.  Her daughter does have one more surgery and would like to stay on xanax until her daughter recovers from it.      Current Medications:  Current Outpatient Prescriptions on File Prior to Visit  Medication Sig Dispense Refill  . ALPRAZolam (XANAX XR) 0.5 MG 24 hr tablet Take 1 tablet (0.5 mg total) by mouth daily. 30 tablet 2  . aspirin 81 MG tablet Take 81 mg by mouth daily.      . Chlorphen-PE-Acetaminophen 4-10-325 MG TABS Take 1 tablet by mouth every 6 (six) hours as needed (sinus congestion). 84 tablet 0  . fexofenadine (ALLEGRA) 180 MG tablet Take 1 tablet (180 mg total) by mouth daily. 90 tablet 3  . gabapentin (NEURONTIN) 100 MG capsule Take 1 capsule (100 mg total) by mouth 3 (three) times daily. 90 capsule 1  . mometasone (NASONEX) 50 MCG/ACT nasal spray Place 2 sprays into the nose daily. 17 g 11  . predniSONE (DELTASONE) 20 MG tablet 3 tabs po day one, then 2 tabs daily x 4 days 11 tablet 0  . rosuvastatin (CRESTOR) 10 MG tablet Take 1/2 to 1 tablet daily for cholesterol 90 tablet 0   No current facility-administered medications on file prior to visit.    Medical History:  Past Medical History  Diagnosis Date  . Hypertension   . Hyperlipidemia   . Cancer ([place])     breast  . Allergy     rhinitis    Allergies:  Allergies  Allergen Reactions  . Penicillins     REACTION: Hives  . Sulfonamide Derivatives  REACTION: Hives     Review of Systems:  Review of Systems  Constitutional: Negative for fever, chills and malaise/fatigue.  HENT: Negative for congestion, ear pain and sore throat.   Respiratory: Negative for cough, shortness of breath and wheezing.   Cardiovascular: Negative for chest pain, palpitations and leg swelling.  Gastrointestinal: Negative for heartburn, diarrhea, constipation, blood in stool and melena.  Genitourinary: Negative.   Skin: Negative.   Neurological: Negative for dizziness, sensory change, loss of consciousness and headaches.  Psychiatric/Behavioral: Negative for depression. The patient is not nervous/anxious and does not have insomnia.      Family history- Review and unchanged  Social history- Review and unchanged  Physical Exam: BP 138/80 mmHg  Pulse 82  Temp(Src) 98.2 F (36.8 C) (Temporal)  Resp 16  Ht 5' 5.5" (1.664 m)  Wt 136 lb (61.689 kg)  BMI 22.28 kg/m2 Wt Readings from Last 3 Encounters:  07/16/15 136 lb (61.689 kg)  07/09/15 137 lb (62.143 kg)  05/03/15 137 lb (62.143 kg)    General Appearance: Well nourished well developed, in no apparent distress. Eyes: PERRLA, EOMs, conjunctiva no swelling or erythema ENT/Mouth: Ear canals normal without obstruction, swelling, erythma, discharge.  TMs normal bilaterally.  Oropharynx moist, clear, without exudate, or postoropharyngeal swelling. Neck: Supple, thyroid normal,no cervical adenopathy  Respiratory: Respiratory effort normal, Breath sounds clear A&P without rhonchi, wheeze, or rale.  No retractions, no accessory usage. Cardio: RRR with no MRGs. Brisk peripheral pulses without edema.  Abdomen: Soft, + BS,  Non tender, no guarding, rebound, hernias, masses. Musculoskeletal: Full ROM, 5/5 strength, Normal gait Skin: Warm, dry without rashes, lesions, ecchymosis.  Neuro: Awake and oriented X 3, Cranial nerves intact. Normal muscle tone, no cerebellar symptoms. Psych: Normal affect, Insight and Judgment appropriate.    Starlyn Skeans, PA-C 9:01 AM [facility]

## 2015-08-19 ENCOUNTER — Other Ambulatory Visit: Payer: Self-pay

## 2015-08-19 DIAGNOSIS — Z1231 Encounter for screening mammogram for malignant neoplasm of breast: Secondary | ICD-10-CM

## 2015-09-04 ENCOUNTER — Other Ambulatory Visit: Payer: Self-pay | Admitting: *Deleted

## 2015-09-04 MED ORDER — GABAPENTIN 100 MG PO CAPS: 100.0000 mg | ORAL_CAPSULE | Freq: Three times a day (TID) | ORAL | Status: DC

## 2015-09-05 ENCOUNTER — Ambulatory Visit
Admission: RE | Admit: 2015-09-05 | Discharge: 2015-09-05 | Disposition: A | Discharge status: Home / Self Care | Payer: PPO | Source: Ambulatory Visit

## 2015-09-05 DIAGNOSIS — Z1231 Encounter for screening mammogram for malignant neoplasm of breast: Secondary | ICD-10-CM

## 2015-12-02 ENCOUNTER — Ambulatory Visit (INDEPENDENT_AMBULATORY_CARE_PROVIDER_SITE_OTHER): Payer: PPO | Admitting: Internal Medicine

## 2015-12-02 ENCOUNTER — Encounter: Payer: Self-pay | Admitting: Internal Medicine

## 2015-12-02 VITALS — BP 124/76 | HR 76 | Temp 98.0°F | Resp 16 | Ht 65.5 in | Wt 134.0 lb

## 2015-12-02 DIAGNOSIS — J069 Acute upper respiratory infection, unspecified: Secondary | ICD-10-CM | POA: Diagnosis not present

## 2015-12-02 MED ORDER — AZITHROMYCIN 250 MG PO TABS: ORAL_TABLET | ORAL | 0 refills | Status: DC

## 2015-12-02 MED ORDER — PREDNISONE 20 MG PO TABS: ORAL_TABLET | ORAL | 0 refills | Status: DC

## 2015-12-02 MED ORDER — AZELASTINE HCL 0.1 % NA SOLN: 2.0000 | Freq: Two times a day (BID) | NASAL | 2 refills | Status: DC

## 2015-12-02 MED ORDER — ALPRAZOLAM ER 0.5 MG PO TB24: 0.5000 mg | ORAL_TABLET | Freq: Every day | ORAL | 2 refills | Status: DC

## 2015-12-02 NOTE — Progress Notes (Signed)
HPI  Patient presents to the office for evaluation of cough.  It has been going on for 1 weeks.  Patient reports night > day, dry, barky, worse with lying down.  They also endorse change in voice, postnasal drip and sore throat, clear rhinorrhea, bilateral ear pain, sinus pressure.   .  They have tried allegra and nasonex.  They report that nothing has worked.  They denies other sick contacts.  Review of Systems  Constitutional: Positive for malaise/fatigue. Negative for chills and fever.  HENT: Positive for congestion, ear pain, hearing loss and sore throat.   Respiratory: Positive for cough. Negative for sputum production, shortness of breath and wheezing.   Cardiovascular: Negative for chest pain, palpitations and leg swelling.  Neurological: Positive for headaches.    PE:  Vitals:   12/02/15 1437  BP: 124/76  Pulse: 76  Resp: 16  Temp: 98 F (36.7 C)   General:  Alert and non-toxic, WDWN, NAD HEENT: NCAT, PERLA, EOM normal, no occular discharge or erythema.  Nasal mucosal edema with sinus tenderness to palpation.  Oropharynx clear with minimal oropharyngeal edema and erythema.  Mucous membranes moist and pink. Neck:  Cervical adenopathy Chest:  RRR no MRGs.  Lungs clear to auscultation A&P with no wheezes rhonchi or rales.   Abdomen: +BS x 4 quadrants, soft, non-tender, no guarding, rigidity, or rebound. Skin: warm and dry no rash Neuro: A&Ox4, CN II-XII grossly intact  Assessment and Plan:   1. Acute URI -nasal saline -cont nasonex -cont allegra -sudafed TID - azithromycin (ZITHROMAX Z-PAK) 250 MG tablet; 2 po day one, then 1 daily x 4 days  Dispense: 6 tablet; Refill: 0 - predniSONE (DELTASONE) 20 MG tablet; 3 tabs po day one, then 2 tabs daily x 4 days  Dispense: 11 tablet; Refill: 0 - azelastine (ASTELIN) 0.1 % nasal spray; Place 2 sprays into both nostrils 2 (two) times daily. Use in each nostril as directed  Dispense: 30 mL; Refill: 2

## 2015-12-24 DIAGNOSIS — Z85828 Personal history of other malignant neoplasm of skin: Secondary | ICD-10-CM | POA: Diagnosis not present

## 2015-12-24 DIAGNOSIS — D485 Neoplasm of uncertain behavior of skin: Secondary | ICD-10-CM | POA: Diagnosis not present

## 2015-12-24 DIAGNOSIS — D239 Other benign neoplasm of skin, unspecified: Secondary | ICD-10-CM | POA: Insufficient documentation

## 2015-12-24 DIAGNOSIS — L821 Other seborrheic keratosis: Secondary | ICD-10-CM | POA: Diagnosis not present

## 2015-12-24 DIAGNOSIS — L570 Actinic keratosis: Secondary | ICD-10-CM | POA: Diagnosis not present

## 2015-12-24 DIAGNOSIS — D2371 Other benign neoplasm of skin of right lower limb, including hip: Secondary | ICD-10-CM | POA: Diagnosis not present

## 2016-01-09 ENCOUNTER — Other Ambulatory Visit: Payer: Self-pay | Admitting: Internal Medicine

## 2016-01-29 ENCOUNTER — Ambulatory Visit (INDEPENDENT_AMBULATORY_CARE_PROVIDER_SITE_OTHER): Payer: PPO | Admitting: Internal Medicine

## 2016-01-29 ENCOUNTER — Encounter: Payer: Self-pay | Admitting: Internal Medicine

## 2016-01-29 VITALS — BP 116/84 | HR 84 | Temp 97.4°F | Resp 16 | Ht 65.5 in | Wt 135.6 lb

## 2016-01-29 DIAGNOSIS — R7309 Other abnormal glucose: Secondary | ICD-10-CM

## 2016-01-29 DIAGNOSIS — Z0000 Encounter for general adult medical examination without abnormal findings: Secondary | ICD-10-CM | POA: Diagnosis not present

## 2016-01-29 DIAGNOSIS — Z136 Encounter for screening for cardiovascular disorders: Secondary | ICD-10-CM | POA: Diagnosis not present

## 2016-01-29 DIAGNOSIS — Z0001 Encounter for general adult medical examination with abnormal findings: Secondary | ICD-10-CM

## 2016-01-29 DIAGNOSIS — Z79899 Other long term (current) drug therapy: Secondary | ICD-10-CM

## 2016-01-29 DIAGNOSIS — I10 Essential (primary) hypertension: Secondary | ICD-10-CM

## 2016-01-29 DIAGNOSIS — M839 Adult osteomalacia, unspecified: Secondary | ICD-10-CM | POA: Diagnosis not present

## 2016-01-29 DIAGNOSIS — Z1212 Encounter for screening for malignant neoplasm of rectum: Secondary | ICD-10-CM

## 2016-01-29 DIAGNOSIS — Z23 Encounter for immunization: Secondary | ICD-10-CM | POA: Diagnosis not present

## 2016-01-29 DIAGNOSIS — E782 Mixed hyperlipidemia: Secondary | ICD-10-CM | POA: Diagnosis not present

## 2016-01-29 LAB — LIPID PANEL
Cholesterol: 233 mg/dL — ABNORMAL HIGH (ref 125–200)
HDL: 120 mg/dL (ref >=46)
LDL CALC: 89 mg/dL (ref <130)
TRIGLYCERIDES: 118 mg/dL (ref <150)
Total CHOL/HDL Ratio: 1.9 Ratio (ref <=5.0)
VLDL: 24 mg/dL (ref <30)

## 2016-01-29 LAB — CBC WITH DIFFERENTIAL/PLATELET
BASOS ABS: 0 {cells}/uL (ref 0–200)
BASOS PCT: 0 %
EOS ABS: 270 {cells}/uL (ref 15–500)
Eosinophils Relative: 3 %
HEMATOCRIT: 41.5 % (ref 35.0–45.0)
Hemoglobin: 13.4 g/dL (ref 11.7–15.5)
LYMPHS PCT: 24 %
Lymphs Abs: 2160 cells/uL (ref 850–3900)
MCH: 31 pg (ref 27.0–33.0)
MCHC: 32.3 g/dL (ref 32.0–36.0)
MCV: 96.1 fL (ref 80.0–100.0)
MONO ABS: 630 {cells}/uL (ref 200–950)
MONOS PCT: 7 %
MPV: 8.8 fL (ref 7.5–12.5)
NEUTROS PCT: 66 %
Neutro Abs: 5940 cells/uL (ref 1500–7800)
PLATELETS: 225 10*3/uL (ref 140–400)
RBC: 4.32 MIL/uL (ref 3.80–5.10)
RDW: 13.1 % (ref 11.0–15.0)
WBC: 9 10*3/uL (ref 3.8–10.8)

## 2016-01-29 LAB — HEPATIC FUNCTION PANEL
ALBUMIN: 4.3 g/dL (ref 3.6–5.1)
ALK PHOS: 92 U/L (ref 33–130)
ALT: 12 U/L (ref 6–29)
AST: 19 U/L (ref 10–35)
Bilirubin, Direct: 0.1 mg/dL (ref <=0.2)
Indirect Bilirubin: 0.4 mg/dL (ref 0.2–1.2)
TOTAL PROTEIN: 6.3 g/dL (ref 6.1–8.1)
Total Bilirubin: 0.5 mg/dL (ref 0.2–1.2)

## 2016-01-29 LAB — BASIC METABOLIC PANEL WITH GFR
BUN: 12 mg/dL (ref 7–25)
CO2: 26 mmol/L (ref 20–31)
CREATININE: 0.77 mg/dL (ref 0.50–0.99)
Calcium: 9.5 mg/dL (ref 8.6–10.4)
Chloride: 102 mmol/L (ref 98–110)
GFR, Est Non African American: 81 mL/min (ref >=60)
GLUCOSE: 67 mg/dL (ref 65–99)
POTASSIUM: 4.8 mmol/L (ref 3.5–5.3)
Sodium: 141 mmol/L (ref 135–146)

## 2016-01-29 LAB — TSH: TSH: 3.56 m[IU]/L

## 2016-01-29 LAB — MAGNESIUM: MAGNESIUM: 1.8 mg/dL (ref 1.5–2.5)

## 2016-01-29 NOTE — Patient Instructions (Signed)

## 2016-01-29 NOTE — Progress Notes (Signed)
[place] [facility] Unk Pinto, M.D.    Uvaldo Bristle. Silverio Lay, P.A.-C      Starlyn Skeans, P.A.-C  [facility]                [address] [place] [id number] Telephone [phone] Telefax [phone]  Annual Screening/Preventative Visit & Comprehensive Evaluation &  Examination     This very nice 65 y.o. DWF presents for a Screening/Preventative Visit & comprehensive evaluation and management of multiple medical co-morbidities.  Patient has been followed for hx/o labile HTN, and screened for Prediabetes, Hyperlipidemia and Vitamin D Deficiency. 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 Patient also has significant hx/o R lumpectomy in 2001 treated with Chemoradiation and followed with 5 years of Arimidex. She has annual MGM's.       Patient relates hx/o elevated BP in the past that has been expectantly monitored.  Patient's BP has been controlled at home and patient denies any cardiac symptoms as chest pain, palpitations, shortness of breath, dizziness or ankle swelling. Today's BP is 116/84.      Patient's hyperlipidemia was not controlled with diet and she was started on Crestor after last visit which she d/c'd due to severe myalgias. Patient is taking RYRE in hopes of controlling her lipids. Last lipids were not at goal: Lab Results  Component Value Date   CHOL 233 (H) 01/29/2016   HDL 120 01/29/2016   LDLCALC 89 01/29/2016   LDLDIRECT 73.3 04/30/2011   TRIG 118 01/29/2016   CHOLHDL 1.9 01/29/2016      Patient has hx/o elevated or abnormal glucoses and is monitored proactively for prediabetes and patient denies reactive hypoglycemic symptoms, visual blurring, diabetic polys, or paresthesias. Last A1c was at goal: Lab Results  Component Value Date   HGBA1C 5.5 01/16/2015      Finally, patient has  history of severe Vitamin D Deficiency of 25 in Sept 2016.   Current Outpatient Prescriptions on File Prior to Visit  Medication Sig  . ALPRAZolam (XANAX XR) 0.5 MG 24 hr tablet Take 1 tablet (0.5 mg total) by mouth daily.  Marland Kitchen aspirin 81 MG tablet Take 81 mg by mouth daily.    Marland Kitchen azelastine (ASTELIN) 0.1 % nasal spray Place 2 sprays into both nostrils 2 (two) times daily. Use in each nostril as directed  . Chlorphen-PE-Acetaminophen 4-10-325 MG TABS Take 1 tablet by mouth every 6 (six) hours as needed (sinus congestion).  . fexofenadine (ALLEGRA) 180 MG tablet Take 1 tablet (180 mg total) by mouth daily.  Marland Kitchen gabapentin (NEURONTIN) 100 MG capsule TAKE (1) CAPSULE THREE TIMES DAILY.  . mometasone (NASONEX) 50 MCG/ACT nasal spray Place 2 sprays into the nose daily.  . rosuvastatin (CRESTOR) 10 MG tablet Take 1/2 to 1 tablet daily for cholesterol   Allergies  Allergen Reactions  . Penicillins     REACTION: Hives  . Sulfonamide Derivatives     REACTION: Hives   Past Medical History:  Diagnosis Date  . Allergy    rhinitis  . Cancer ([place])    breast  . Hyperlipidemia   . Hypertension    Health Maintenance  Topic Date Due  . Hepatitis C Screening  06-09-1950  . ZOSTAVAX  05/20/2009  . DEXA SCAN  05/20/2014  . INFLUENZA VACCINE  11/19/2015  . PNA vac Low Risk Adult (2 of 2 - PCV13) 01/16/2016  . MAMMOGRAM  09/04/2017  . TETANUS/TDAP  10/27/2020  . COLONOSCOPY  03/14/2022   Immunization History  Administered Date(s) Administered  . Influenza Split 02/26/2011, 03/11/2012  . Influenza, High Dose Seasonal PF 01/29/2016  . Influenza,inj,Quad PF,36+ Mos 05/02/2013  . Pneumococcal Polysaccharide-23 01/16/2015  . Tdap 10/28/2010   Past Surgical History:  Procedure Laterality Date  . ABDOMINAL HYSTERECTOMY    . BREAST LUMPECTOMY     Family History  Problem Relation Age of Onset  . Arthritis Other   . Hyperlipidemia Other   . Cancer Other     lung, prostate, breast  . Cancer  Mother 86    BREAST AND LUNG  . Cancer Father     BRAIN TUMOR  . Cancer Daughter 28    BREAST   Social History  Substance Use Topics  . Smoking status: Never Smoker  . Smokeless tobacco: Never Used  . Alcohol use No     Comment: social    ROS Constitutional: Denies fever, chills, weight loss/gain, headaches, insomnia,  night sweats, and change in appetite. Does c/o fatigue. Eyes: Denies redness, blurred vision, diplopia, discharge, itchy, watery eyes.  ENT: Denies discharge, congestion, post nasal drip, epistaxis, sore throat, earache, hearing loss, dental pain, Tinnitus, Vertigo, Sinus pain, snoring.  Cardio: Denies chest pain, palpitations, irregular heartbeat, syncope, dyspnea, diaphoresis, orthopnea, PND, claudication, edema Respiratory: denies cough, dyspnea, DOE, pleurisy, hoarseness, laryngitis, wheezing.  Gastrointestinal: Denies dysphagia, heartburn, reflux, water brash, pain, cramps, nausea, vomiting, bloating, diarrhea, constipation, hematemesis, melena, hematochezia, jaundice, hemorrhoids Genitourinary: Denies dysuria, frequency, urgency, nocturia, hesitancy, discharge, hematuria, flank pain Breast: Breast lumps, nipple discharge, bleeding.  Musculoskeletal: Denies arthralgia, myalgia, stiffness, Jt. Swelling, pain, limp, and strain/sprain. Denies falls. Skin: Denies puritis, rash, hives, warts, acne, eczema, changing in skin lesion Neuro: No weakness, tremor, incoordination, spasms, paresthesia, pain Psychiatric: Denies confusion, memory loss, sensory loss. Denies Depression. Endocrine: Denies change in weight, skin, hair change, nocturia, and paresthesia, diabetic polys, visual blurring, hyper / hypo glycemic episodes.  Heme/Lymph: No excessive bleeding, bruising, enlarged lymph nodes.  Physical Exam  BP 116/84   Pulse 84   Temp 97.4 F (36.3 C)   Resp 16   Ht 5' 5.5" (1.664 m)   Wt 135 lb 9.6 oz (61.5 kg)   BMI 22.22 kg/m   General Appearance: Well nourished  and in no apparent distress.  Eyes: PERRLA, EOMs, conjunctiva no swelling or erythema, normal fundi and vessels. Sinuses: No frontal/maxillary tenderness ENT/Mouth: EACs patent / TMs  nl. Nares clear without erythema, swelling, mucoid exudates. Oral hygiene is good. No erythema, swelling, or exudate. Tongue normal, non-obstructing. Tonsils not swollen or erythematous. Hearing normal.  Neck: Supple, thyroid normal. No bruits, nodes or JVD. Respiratory: Respiratory effort normal.  BS equal and clear bilateral without rales, rhonci, wheezing or stridor. Cardio: Heart sounds are normal with regular rate and rhythm and no murmurs, rubs or gallops. Peripheral pulses are normal and equal bilaterally without edema. No aortic or femoral bruits. Chest: symmetric with normal excursions and percussion. Breasts: Symmetric, without lumps, nipple discharge, retractions, or fibrocystic changes.  Abdomen: Flat, soft with bowel sounds active. Nontender, no guarding, rebound, hernias, masses, or organomegaly.  Lymphatics: Non tender without lymphadenopathy.  Musculoskeletal: Full ROM all peripheral extremities, joint stability, 5/5 strength, and normal gait. Skin: Warm and dry without rashes, lesions, cyanosis, clubbing or  ecchymosis.  Neuro: Cranial nerves intact, reflexes equal bilaterally. Normal  muscle tone, no cerebellar symptoms. Sensation intact.  Pysch: Alert and oriented X 3, normal affect, Insight and Judgment appropriate.   Assessment and Plan  1. Annual Preventative Screening Examination  - Microalbumin / creatinine urine ratio - EKG 12-Lead - POC Hemoccult Bld/Stl  - Urinalysis, Routine w reflex microscopic - CBC with Differential/Platelet - BASIC METABOLIC PANEL WITH GFR - Hepatic function panel - Magnesium - Lipid panel - TSH - Hemoglobin A1c - Insulin, random - VITAMIN D 25 Hydroxy  2. Essential hypertension  - Microalbumin / creatinine urine ratio - EKG 12-Lead - TSH  3. Mixed  hyperlipidemia  - EKG 12-Lead - Lipid panel - Pending labs, anticipate re-treat with twice weekly dosing  4. Abnormal glucose  - Hemoglobin A1c - Insulin, random  5. Vitamin D deficient osteomalacia  - VITAMIN D 25 Hydroxy  6. Screening for rectal cancer  - POC Hemoccult Bld/Stl   7. Screening for ischemic heart disease  - EKG 12-Lead  8. Medication management  - Urinalysis, Routine w reflex microscopic  - CBC with Differential/Platelet - BASIC METABOLIC PANEL WITH GFR - Hepatic function panel - Magnesium  9. Need for prophylactic vaccination and inoculation against influenza  - Flu vaccine HIGH DOSE PF (Fluzone High dose)       Continue prudent diet as discussed, weight control, BP monitoring, regular exercise, and medications. Discussed med's effects and SE's. Screening labs and tests as requested with regular follow-up as recommended. Over 40 minutes of exam, counseling, chart review and high complex critical decision making was performed.

## 2016-01-30 LAB — URINALYSIS, ROUTINE W REFLEX MICROSCOPIC
BILIRUBIN URINE: NEGATIVE
GLUCOSE, UA: NEGATIVE
KETONES UR: NEGATIVE
NITRITE: NEGATIVE
PH: 8 (ref 5.0–8.0)
SPECIFIC GRAVITY, URINE: 1.011 (ref 1.001–1.035)

## 2016-01-30 LAB — URINALYSIS, MICROSCOPIC ONLY
Bacteria, UA: NONE SEEN [HPF]
Casts: NONE SEEN [LPF]
Crystals: NONE SEEN [HPF]
YEAST: NONE SEEN [HPF]

## 2016-01-30 LAB — HEMOGLOBIN A1C
Hgb A1c MFr Bld: 5.1 % (ref <5.7)
MEAN PLASMA GLUCOSE: 100 mg/dL

## 2016-01-30 LAB — MICROALBUMIN / CREATININE URINE RATIO
CREATININE, URINE: 62 mg/dL (ref 20–320)
MICROALB UR: 9.8 mg/dL
Microalb Creat Ratio: 158 mcg/mg creat — ABNORMAL HIGH (ref <30)

## 2016-01-30 LAB — INSULIN, RANDOM: INSULIN: 1.9 u[IU]/mL — AB (ref 2.0–19.6)

## 2016-01-30 LAB — VITAMIN D 25 HYDROXY (VIT D DEFICIENCY, FRACTURES): VIT D 25 HYDROXY: 86 ng/mL (ref 30–100)

## 2016-02-10 ENCOUNTER — Encounter: Payer: Self-pay | Admitting: Internal Medicine

## 2016-02-10 ENCOUNTER — Telehealth: Payer: Self-pay | Admitting: Internal Medicine

## 2016-02-10 MED ORDER — ONDANSETRON HCL 4 MG PO TABS: 4.0000 mg | ORAL_TABLET | Freq: Every day | ORAL | 1 refills | Status: DC | PRN

## 2016-02-10 MED ORDER — PREDNISONE 20 MG PO TABS: ORAL_TABLET | ORAL | 0 refills | Status: DC

## 2016-02-10 NOTE — Telephone Encounter (Signed)
Patient called reporting fevers, chills, nausea, diarrhea, and lack of energy after having flu shot several days ago.  Will advise to take prednisone and also take tylenol prn, and immodium prn for diarrhea.  Push fluids and rest.

## 2016-02-25 ENCOUNTER — Other Ambulatory Visit: Payer: Self-pay | Admitting: Physician Assistant

## 2016-04-23 ENCOUNTER — Other Ambulatory Visit: Payer: Self-pay | Admitting: Physician Assistant

## 2016-05-05 DIAGNOSIS — H52221 Regular astigmatism, right eye: Secondary | ICD-10-CM | POA: Diagnosis not present

## 2016-05-14 ENCOUNTER — Encounter: Payer: Self-pay | Admitting: Internal Medicine

## 2016-05-14 ENCOUNTER — Ambulatory Visit (INDEPENDENT_AMBULATORY_CARE_PROVIDER_SITE_OTHER): Payer: PPO | Admitting: Internal Medicine

## 2016-05-14 VITALS — BP 118/68 | HR 70 | Temp 98.2°F | Resp 16 | Ht 65.5 in | Wt 134.0 lb

## 2016-05-14 DIAGNOSIS — F411 Generalized anxiety disorder: Secondary | ICD-10-CM | POA: Diagnosis not present

## 2016-05-14 DIAGNOSIS — E782 Mixed hyperlipidemia: Secondary | ICD-10-CM | POA: Diagnosis not present

## 2016-05-14 DIAGNOSIS — M839 Adult osteomalacia, unspecified: Secondary | ICD-10-CM

## 2016-05-14 DIAGNOSIS — I10 Essential (primary) hypertension: Secondary | ICD-10-CM | POA: Diagnosis not present

## 2016-05-14 DIAGNOSIS — Z79899 Other long term (current) drug therapy: Secondary | ICD-10-CM | POA: Diagnosis not present

## 2016-05-14 MED ORDER — ESTRADIOL 10 MCG VA TABS: ORAL_TABLET | VAGINAL | 3 refills | Status: DC

## 2016-05-14 NOTE — Progress Notes (Signed)
Assessment and Plan:  Hypertension:  -Continue medication -monitor blood pressure at home. -Continue DASH diet -Reminder to go to the ER if any CP, SOB, nausea, dizziness, severe HA, changes vision/speech, left arm numbness and tingling and jaw pain.  Cholesterol - Continue diet and exercise -Check cholesterol.   PreDiabetes without complications -Continue diet and exercise.  -Check A1C  Vitamin D Def -continue medications.   Generalized anxiety disorder -take xanax xr only as needed  Continue diet and meds as discussed. Further disposition pending results of labs. Discussed med's effects and SE's.    HPI 66 y.o. female  presents for 3 month follow up with hypertension, hyperlipidemia, diabetes and vitamin D deficiency.   Her blood pressure has been controlled at home, today their BP is BP: 118/68.She does not workout. She denies chest pain, shortness of breath, dizziness.   She is on cholesterol medication and denies myalgias. Her cholesterol is at goal. The cholesterol was:  01/29/2016: Cholesterol 233; HDL 120; LDL Cholesterol 89; Triglycerides 118   She has been working on diet and exercise for diabetes without complications, she is on bASA, she is not on ACE/ARB, and denies  foot ulcerations, hyperglycemia, hypoglycemia , increased appetite, nausea, paresthesia of the feet, polydipsia, polyuria, visual disturbances, vomiting and weight loss. Last A1C was: 01/29/2016: Hgb A1c MFr Bld 5.1   Patient is on Vitamin D supplement. 01/29/2016: Vit D, 25-Hydroxy 86  She reports that she is very rarely taking her xanax xr.  She reports hat he is doing well.  She was really taking this medication to help her through her daughters breast cancer.    Current Medications:  Current Outpatient Prescriptions on File Prior to Visit  Medication Sig Dispense Refill  . ALPRAZolam (XANAX XR) 0.5 MG 24 hr tablet Take 1 tablet (0.5 mg total) by mouth daily. 30 tablet 2  . azelastine (ASTELIN)  0.1 % nasal spray Place 2 sprays into both nostrils 2 (two) times daily. Use in each nostril as directed 30 mL 2  . gabapentin (NEURONTIN) 100 MG capsule TAKE (1) CAPSULE THREE TIMES DAILY. 90 capsule 0   No current facility-administered medications on file prior to visit.    Medical History:  Past Medical History:  Diagnosis Date  . Allergy    rhinitis  . Cancer ([place])    breast  . Hyperlipidemia   . Hypertension    Allergies:  Allergies  Allergen Reactions  . Penicillins     REACTION: Hives  . Sulfonamide Derivatives     REACTION: Hives     Review of Systems:  Review of Systems  Constitutional: Negative for chills, fever and malaise/fatigue.  HENT: Negative for congestion, ear pain and sore throat.   Eyes: Negative.   Respiratory: Negative for cough, shortness of breath and wheezing.   Cardiovascular: Negative for chest pain, palpitations and leg swelling.  Gastrointestinal: Negative for abdominal pain, blood in stool, constipation, diarrhea, heartburn and melena.  Genitourinary: Negative.   Skin: Negative.   Neurological: Negative for dizziness, sensory change, loss of consciousness and headaches.  Psychiatric/Behavioral: Negative for depression. The patient is not nervous/anxious and does not have insomnia.     Family history- Review and unchanged  Social history- Review and unchanged  Physical Exam: BP 118/68   Pulse 70   Temp 98.2 F (36.8 C) (Temporal)   Resp 16   Ht 5' 5.5" (1.664 m)   Wt 134 lb (60.8 kg)   BMI 21.96 kg/m  Wt Readings from Last 3  Encounters:  05/14/16 134 lb (60.8 kg)  01/29/16 135 lb 9.6 oz (61.5 kg)  12/02/15 134 lb (60.8 kg)   General Appearance: Well nourished well developed, non-toxic appearing, in no apparent distress. Eyes: PERRLA, EOMs, conjunctiva no swelling or erythema ENT/Mouth: Ear canals clear with no erythema, swelling, or discharge.  TMs normal bilaterally, oropharynx clear, moist, with no exudate.   Neck: Supple,  thyroid normal, no JVD, no cervical adenopathy.  Respiratory: Respiratory effort normal, breath sounds clear A&P, no wheeze, rhonchi or rales noted.  No retractions, no accessory muscle usage Cardio: RRR with no MRGs. No noted edema.  Abdomen: Soft, + BS.  Non tender, no guarding, rebound, hernias, masses. Musculoskeletal: Full ROM, 5/5 strength, Normal gait Skin: Warm, dry without rashes, lesions, ecchymosis.  Neuro: Awake and oriented X 3, Cranial nerves intact. No cerebellar symptoms.  Psych: normal affect, Insight and Judgment appropriate.    Starlyn Skeans, PA-C 9:11 AM [facility]

## 2016-05-26 ENCOUNTER — Encounter: Payer: Self-pay | Admitting: Internal Medicine

## 2016-05-26 ENCOUNTER — Ambulatory Visit (INDEPENDENT_AMBULATORY_CARE_PROVIDER_SITE_OTHER): Payer: PPO | Admitting: Internal Medicine

## 2016-05-26 VITALS — BP 116/70 | HR 76 | Temp 98.2°F | Resp 16 | Ht 65.5 in

## 2016-05-26 DIAGNOSIS — J069 Acute upper respiratory infection, unspecified: Secondary | ICD-10-CM | POA: Diagnosis not present

## 2016-05-26 MED ORDER — DEXAMETHASONE SODIUM PHOSPHATE 10 MG/ML IJ SOLN: 10.0000 mg | Freq: Once | INTRAMUSCULAR | Status: DC

## 2016-05-26 MED ORDER — DOXYCYCLINE HYCLATE 100 MG PO CAPS: 100.0000 mg | ORAL_CAPSULE | Freq: Two times a day (BID) | ORAL | 0 refills | Status: DC

## 2016-05-26 MED ORDER — FLUTICASONE PROPIONATE 50 MCG/ACT NA SUSP: 2.0000 | Freq: Every day | NASAL | 0 refills | Status: DC

## 2016-05-26 MED ORDER — PROMETHAZINE-DM 6.25-15 MG/5ML PO SYRP: ORAL_SOLUTION | ORAL | 1 refills | Status: DC

## 2016-05-26 NOTE — Progress Notes (Signed)
HPI  Patient presents to the office for evaluation of cough.  It has been going on for 4 days.  Patient reports wet, cough with green sputum production.  They also endorse change in voice, postnasal drip, sputum production and nasal congestion, sore throat, ear pain, headache.  .  They have tried robitussin.  They report that nothing has worked.  They denies other sick contacts.  Review of Systems  Constitutional: Positive for malaise/fatigue. Negative for chills and fever.  HENT: Positive for congestion, ear pain, hearing loss and sore throat.   Respiratory: Positive for cough. Negative for sputum production, shortness of breath and wheezing.   Cardiovascular: Negative for chest pain, palpitations and leg swelling.  Neurological: Positive for headaches.    PE:  Vitals:   05/26/16 1635  BP: 116/70  Pulse: 76  Resp: 16  Temp: 98.2 F (36.8 C)    General:  Alert and non-toxic, WDWN, NAD HEENT: NCAT, PERLA, EOM normal, no occular discharge or erythema.  Nasal mucosal edema with sinus tenderness to palpation.  Oropharynx clear with minimal oropharyngeal edema and erythema.  Mucous membranes moist and pink. Neck:  Cervical adenopathy Chest:  RRR no  3/6 murmur heard best on left sternal border, no RGs.  Lungs clear to auscultation A&P with no wheezes rhonchi or rales.   Abdomen: +BS x 4 quadrants, soft, non-tender, no guarding, rigidity, or rebound. Skin: warm and dry no rash Neuro: A&Ox4, CN II-XII grossly intact  Assessment and Plan:   1. Acute URI -patient cannot tolerate zpaks without severe diarrhea -will try doxycycline 100 mg BID x 7 days instead -cont daily antihistamine and astelin - fluticasone (FLONASE) 50 MCG/ACT nasal spray; Place 2 sprays into both nostrils daily.  Dispense: 16 g; Refill: 0 - promethazine-dextromethorphan (PROMETHAZINE-DM) 6.25-15 MG/5ML syrup; Take 5-10 ML PO q8hrs prn for cough  Dispense: 180 mL; Refill: 1 - dexamethasone (DECADRON) injection 10 mg;  Inject 1 mL (10 mg total) into the muscle once.

## 2016-06-23 DIAGNOSIS — D1800 Hemangioma unspecified site: Secondary | ICD-10-CM | POA: Insufficient documentation

## 2016-06-23 DIAGNOSIS — L570 Actinic keratosis: Secondary | ICD-10-CM | POA: Diagnosis not present

## 2016-06-23 DIAGNOSIS — C4441 Basal cell carcinoma of skin of scalp and neck: Secondary | ICD-10-CM | POA: Diagnosis not present

## 2016-06-23 DIAGNOSIS — D1801 Hemangioma of skin and subcutaneous tissue: Secondary | ICD-10-CM | POA: Diagnosis not present

## 2016-06-23 DIAGNOSIS — D485 Neoplasm of uncertain behavior of skin: Secondary | ICD-10-CM | POA: Diagnosis not present

## 2016-06-23 DIAGNOSIS — L821 Other seborrheic keratosis: Secondary | ICD-10-CM | POA: Diagnosis not present

## 2016-06-23 DIAGNOSIS — Z85828 Personal history of other malignant neoplasm of skin: Secondary | ICD-10-CM | POA: Diagnosis not present

## 2016-06-25 ENCOUNTER — Other Ambulatory Visit: Payer: Self-pay | Admitting: Physician Assistant

## 2016-07-15 DIAGNOSIS — C4441 Basal cell carcinoma of skin of scalp and neck: Secondary | ICD-10-CM | POA: Diagnosis not present

## 2016-07-15 DIAGNOSIS — L988 Other specified disorders of the skin and subcutaneous tissue: Secondary | ICD-10-CM | POA: Diagnosis not present

## 2016-07-15 DIAGNOSIS — Z85828 Personal history of other malignant neoplasm of skin: Secondary | ICD-10-CM | POA: Diagnosis not present

## 2016-08-18 ENCOUNTER — Ambulatory Visit: Payer: Self-pay | Admitting: Internal Medicine

## 2016-08-19 ENCOUNTER — Other Ambulatory Visit: Payer: Self-pay | Admitting: Internal Medicine

## 2016-08-20 ENCOUNTER — Other Ambulatory Visit: Payer: Self-pay | Admitting: Physician Assistant

## 2016-08-20 MED ORDER — ALPRAZOLAM ER 0.5 MG PO TB24: 0.5000 mg | ORAL_TABLET | Freq: Every day | ORAL | 2 refills | Status: DC

## 2016-08-20 NOTE — Progress Notes (Signed)
Xanax called into pharmacy 3rd May 2018 @ 11:06am by DD

## 2016-08-20 NOTE — Progress Notes (Unsigned)
Future Appointments Date Time Provider [place]  09/09/2016 9:30 AM Vicie Mutters, PA-C [facility] None  02/24/2017 9:00 AM Unk Pinto, MD [facility] None

## 2016-09-08 ENCOUNTER — Other Ambulatory Visit: Payer: Self-pay | Admitting: Internal Medicine

## 2016-09-08 DIAGNOSIS — Z1231 Encounter for screening mammogram for malignant neoplasm of breast: Secondary | ICD-10-CM

## 2016-09-08 NOTE — Progress Notes (Signed)
Patient ID: Kathryn Chavez, female   DOB: Nov 30, 1950, 66 y.o.   MRN: [id number]   [facility] VISIT AND CPE  Assessment:    Essential hypertension - continue medications, DASH diet, exercise and monitor at home. Call if greater than 130/80.  - Urinalysis, Routine w reflex microscopic (not at [facility]) - Microalbumin / creatinine urine ratio - [place], RETROPERITNL ABD,  LTD - TSH   Vitamin D deficient osteomalacia - Vit D  25 hydroxy (rtn osteoporosis monitoring)  Hyperlipidemia -continue medications, check lipids, decrease fatty foods, increase activity.  - Lipid panel   Medication management - CBC with Differential/Platelet - BASIC METABOLIC PANEL WITH GFR - Hepatic function panel - Magnesium   Estrogen deficiency - DG Bone Density; Future  Seasonal allergic rhinitis, unspecified trigger  Elevated liver enzymes -     Hepatic function panel  GAD (generalized anxiety disorder) If wants immediate xanax, can send in  Encounter for Medicare annual wellness exam  Medication management -     Magnesium  Aortic stenosis, moderate Will make appointment today to follow up with Dr. Johnsie Cancel for echo    Over 40 minutes of exam, counseling, chart review and critical decision making was performed Future Appointments Date Time Provider [place]  09/28/2016 8:50 AM GI-BCG MM 3 GI-BCGMM GI-BREAST CE  02/24/2017 9:00 AM Unk Pinto, MD GAAM-GAAIM None     Plan:   During the course of the visit the patient was educated and counseled about appropriate screening and preventive services including:    Pneumococcal vaccine   Prevnar 13  Influenza vaccine  Td vaccine  Screening electrocardiogram  Bone densitometry screening  Colorectal cancer screening  Diabetes screening  Glaucoma screening  Nutrition counseling   Advanced directives: requested   Subjective:  Kathryn Chavez is a 66 y.o. female who presents for Medicare Annual Wellness Visit  and and follow up   She does have a remote history of breast cancer which was treated with chemo and radiation. Daughter is Kathryn Chavez, doing well since surgery.    She has had elevated blood pressure which is managed by diet and exercise alone. Her blood pressure has been controlled at home, today their BP is BP: 120/72  She does workout. She denies chest pain, shortness of breath, dizziness. She does have some shortness of breath with hills, can walk on flat ground without issues, due to moderate AS, last echo was 2015 but she is followed by Dr. Johnsie Cancel for this.    She is on cholesterol medication, followed by lipid clinic, just on RYRE and denies myalgias. Her cholesterol is not at goal. The cholesterol last visit was:   Lab Results  Component Value Date   CHOL 233 (H) 01/29/2016   HDL 120 01/29/2016   LDLCALC 89 01/29/2016   LDLDIRECT 73.3 04/30/2011   TRIG 118 01/29/2016   CHOLHDL 1.9 01/29/2016   She has never had a history of prediabetes that she can recall. She has not been working on diet and exercise for prediabetes, and denies foot ulcerations, hyperglycemia, hypoglycemia , increased appetite, nausea, paresthesia of the feet, polydipsia, polyuria, visual disturbances, vomiting and weight loss. Last A1C in the office was:  Lab Results  Component Value Date   HGBA1C 5.1 01/29/2016   Patient is on Vitamin D supplement.   Lab Results  Component Value Date   VD25OH 86 01/29/2016      Has cytocele, does not want surgery, may do pelvic PT BMI is Body mass index  is 22.19 kg/m., she is working on diet and exercise. Wt Readings from Last 3 Encounters:  09/09/16 135 lb 6.4 oz (61.4 kg)  05/14/16 134 lb (60.8 kg)  01/29/16 135 lb 9.6 oz (61.5 kg)    Medication Review: Current Outpatient Prescriptions on File Prior to Visit  Medication Sig  . ALPRAZolam (XANAX XR) 0.5 MG 24 hr tablet Take 1 tablet (0.5 mg total) by mouth daily.  Marland Kitchen azelastine (ASTELIN) 0.1 % nasal spray Place  2 sprays into both nostrils 2 (two) times daily. Use in each nostril as directed  . fluticasone (FLONASE) 50 MCG/ACT nasal spray Place 2 sprays into both nostrils daily.  Marland Kitchen gabapentin (NEURONTIN) 100 MG capsule TAKE (1) CAPSULE THREE TIMES DAILY.   Current Facility-Administered Medications on File Prior to Visit  Medication  . dexamethasone (DECADRON) injection 10 mg    Current Problems (verified) Patient Active Problem List   Diagnosis Date Noted  . Aortic stenosis, moderate 09/09/2016  . Hyperlipidemia 01/16/2015  . Abnormal ECG 01/23/2014  . Vitamin D deficient osteomalacia 10/28/2010  . Elevated liver enzymes 12/16/2009  . Essential hypertension 12/16/2009  . Allergic rhinitis 12/16/2009    Screening Tests Immunization History  Administered Date(s) Administered  . Influenza Split 02/26/2011, 03/11/2012  . Influenza, High Dose Seasonal PF 01/29/2016  . Influenza,inj,Quad PF,36+ Mos 05/02/2013  . Pneumococcal Polysaccharide-23 01/16/2015  . Tdap 10/28/2010    Preventative care: Last colonoscopy: 2013, Dr. Watt Climes Last mammogram: 08/2015 Last pap smear/pelvic exam: 2010 declines, hx of hysterectomy DEXA: DUE  Echo 01/2014  Prior vaccinations: TD or Tdap: 2012  Influenza: 2017  Pneumococcal: 2016 Prevnar13: declines Shingles/Zostavax: Going to check with insurance   Names of Other Physician/Practitioners you currently use: 1. [facility] here for primary care 2. Dr. [place], eye doctor, last visit Feb 2018 3. Dr. Renne Musca , dentist, last visit May 2018 Patient Care Team: Unk Pinto, MD as PCP - General (Internal Medicine) [place], Heather, OD (Optometry)  Allergies Allergies  Allergen Reactions  . Penicillins     REACTION: Hives  . Sulfonamide Derivatives     REACTION: Hives    SURGICAL HISTORY She  has a past surgical history that includes Abdominal hysterectomy and Breast lumpectomy. FAMILY HISTORY Her family history  includes Arthritis in her other; Cancer in her father and other; Cancer (age of onset: 65) in her daughter; Cancer (age of onset: 12) in her mother; Hyperlipidemia in her other. SOCIAL HISTORY She  reports that she has never smoked. She has never used smokeless tobacco. She reports that she does not drink alcohol or use drugs.  MEDICARE WELLNESS OBJECTIVES: Physical activity: Current Exercise Habits: Home exercise routine, Type of exercise: walking;yoga, Frequency (Times/Week): 7, Intensity: Mild Cardiac risk factors: Cardiac Risk Factors include: advanced age (>73men, >90 women);dyslipidemia;hypertension Depression/mood screen:   Depression screen [facility] 2/9 09/09/2016  Decreased Interest 0  Down, Depressed, Hopeless 0  PHQ - 2 Score 0    ADLs:  In your present state of health, do you have any difficulty performing the following activities: 09/09/2016 01/29/2016  Hearing? N N  Vision? N N  Difficulty concentrating or making decisions? N N  Walking or climbing stairs? N N  Dressing or bathing? N N  Doing errands, shopping? N N  Some recent data might be hidden     Cognitive Testing  Alert? Yes  Normal Appearance?Yes  Oriented to person? Yes  Place? Yes   Time? Yes  Recall of three objects?  Yes  Can perform simple calculations? Yes  Displays appropriate judgment?Yes  Can read the correct time from a watch face?Yes  EOL planning: Does Patient Have a Medical Advance Directive?: Yes Type of Advance Directive: Healthcare Power of Attorney, Living will [place] in Chart?: No - copy requested  Review of Systems  Constitutional: Negative for chills and fever.  HENT: Negative for congestion, ear pain and sore throat.   Eyes: Negative for blurred vision and double vision.  Respiratory: Negative for cough, shortness of breath and wheezing.   Cardiovascular: Negative for chest pain, palpitations and leg swelling.  Gastrointestinal: Negative for blood in stool,  constipation, diarrhea, heartburn, melena, nausea and vomiting.  Genitourinary: Negative for dysuria, frequency and urgency.  Musculoskeletal: Negative.   Neurological: Positive for sensory change. Negative for dizziness, loss of consciousness and headaches.  Psychiatric/Behavioral: Negative for depression. The patient has insomnia. The patient is not nervous/anxious.      Objective:     Today's Vitals   09/09/16 0936  BP: 120/72  Pulse: 70  Resp: 16  Temp: 97.7 F (36.5 C)  SpO2: 96%  Weight: 135 lb 6.4 oz (61.4 kg)  Height: 5' 5.5" (1.664 m)  PainSc: 0-No pain   Body mass index is 22.19 kg/m.  General appearance: alert, no distress, WD/WN, female HEENT: normocephalic, sclerae anicteric, TMs pearly, nares patent, no discharge or erythema, pharynx normal Oral cavity: MMM, no lesions Neck: supple, no lymphadenopathy, no thyromegaly, no masses Heart: RRR, normal S1, S2, 3/6 systolic murmur with radiation to carotids Lungs: CTA bilaterally, no wheezes, rhonchi, or rales Abdomen: +bs, soft, non tender, non distended, no masses, no hepatomegaly, no splenomegaly Musculoskeletal: nontender, no swelling, no obvious deformity Extremities: no edema, no cyanosis, no clubbing Pulses: 2+ symmetric, upper and lower extremities, normal cap refill Neurological: alert, oriented x 3, CN2-12 intact, strength normal upper extremities and lower extremities, sensation normal throughout, DTRs 2+ throughout, no cerebellar signs, gait normal Psychiatric: normal affect, behavior normal, pleasant   Medicare Attestation I have personally reviewed: The patient's medical and social history Their use of alcohol, tobacco or illicit drugs Their current medications and supplements The patient's functional ability including ADLs,fall risks, home safety risks, cognitive, and hearing and visual impairment Diet and physical activities Evidence for depression or mood disorders  The patient's weight, height,  BMI, and visual acuity have been recorded in the chart.  I have made referrals, counseling, and provided education to the patient based on review of the above and I have provided the patient with a written personalized care plan for preventive services.     Vicie Mutters, PA-C   09/09/2016

## 2016-09-09 ENCOUNTER — Encounter: Payer: Self-pay | Admitting: Physician Assistant

## 2016-09-09 ENCOUNTER — Ambulatory Visit (INDEPENDENT_AMBULATORY_CARE_PROVIDER_SITE_OTHER): Payer: PPO | Admitting: Physician Assistant

## 2016-09-09 VITALS — BP 120/72 | HR 70 | Temp 97.7°F | Resp 16 | Ht 65.5 in | Wt 135.4 lb

## 2016-09-09 DIAGNOSIS — I10 Essential (primary) hypertension: Secondary | ICD-10-CM

## 2016-09-09 DIAGNOSIS — J302 Other seasonal allergic rhinitis: Secondary | ICD-10-CM

## 2016-09-09 DIAGNOSIS — R6889 Other general symptoms and signs: Secondary | ICD-10-CM

## 2016-09-09 DIAGNOSIS — Z0001 Encounter for general adult medical examination with abnormal findings: Secondary | ICD-10-CM | POA: Diagnosis not present

## 2016-09-09 DIAGNOSIS — Z0000 Encounter for general adult medical examination without abnormal findings: Secondary | ICD-10-CM

## 2016-09-09 DIAGNOSIS — F411 Generalized anxiety disorder: Secondary | ICD-10-CM

## 2016-09-09 DIAGNOSIS — E2839 Other primary ovarian failure: Secondary | ICD-10-CM | POA: Diagnosis not present

## 2016-09-09 DIAGNOSIS — E7800 Pure hypercholesterolemia, unspecified: Secondary | ICD-10-CM | POA: Diagnosis not present

## 2016-09-09 DIAGNOSIS — R748 Abnormal levels of other serum enzymes: Secondary | ICD-10-CM | POA: Diagnosis not present

## 2016-09-09 DIAGNOSIS — Z79899 Other long term (current) drug therapy: Secondary | ICD-10-CM

## 2016-09-09 DIAGNOSIS — I350 Nonrheumatic aortic (valve) stenosis: Secondary | ICD-10-CM | POA: Diagnosis not present

## 2016-09-09 DIAGNOSIS — M839 Adult osteomalacia, unspecified: Secondary | ICD-10-CM

## 2016-09-09 LAB — CBC WITH DIFFERENTIAL/PLATELET
BASOS PCT: 1 %
Basophils Absolute: 51 cells/uL (ref 0–200)
EOS ABS: 102 {cells}/uL (ref 15–500)
Eosinophils Relative: 2 %
HCT: 40.4 % (ref 35.0–45.0)
Hemoglobin: 13.1 g/dL (ref 11.7–15.5)
LYMPHS PCT: 40 %
Lymphs Abs: 2040 cells/uL (ref 850–3900)
MCH: 30.8 pg (ref 27.0–33.0)
MCHC: 32.4 g/dL (ref 32.0–36.0)
MCV: 94.8 fL (ref 80.0–100.0)
MONOS PCT: 9 %
MPV: 9.5 fL (ref 7.5–12.5)
Monocytes Absolute: 459 cells/uL (ref 200–950)
Neutro Abs: 2448 cells/uL (ref 1500–7800)
Neutrophils Relative %: 48 %
PLATELETS: 213 10*3/uL (ref 140–400)
RBC: 4.26 MIL/uL (ref 3.80–5.10)
RDW: 14 % (ref 11.0–15.0)
WBC: 5.1 10*3/uL (ref 3.8–10.8)

## 2016-09-09 LAB — LIPID PANEL
CHOLESTEROL: 271 mg/dL — AB (ref <200)
HDL: 119 mg/dL (ref >50)
LDL Cholesterol: 128 mg/dL — ABNORMAL HIGH (ref <100)
Total CHOL/HDL Ratio: 2.3 Ratio (ref <5.0)
Triglycerides: 118 mg/dL (ref <150)
VLDL: 24 mg/dL (ref <30)

## 2016-09-09 LAB — HEPATIC FUNCTION PANEL
ALBUMIN: 4.2 g/dL (ref 3.6–5.1)
ALT: 16 U/L (ref 6–29)
AST: 21 U/L (ref 10–35)
Alkaline Phosphatase: 70 U/L (ref 33–130)
BILIRUBIN TOTAL: 0.5 mg/dL (ref 0.2–1.2)
Bilirubin, Direct: 0.1 mg/dL (ref <=0.2)
Indirect Bilirubin: 0.4 mg/dL (ref 0.2–1.2)
Total Protein: 6.1 g/dL (ref 6.1–8.1)

## 2016-09-09 LAB — BASIC METABOLIC PANEL WITH GFR
BUN: 13 mg/dL (ref 7–25)
CALCIUM: 9.6 mg/dL (ref 8.6–10.4)
CO2: 26 mmol/L (ref 20–31)
CREATININE: 0.79 mg/dL (ref 0.50–0.99)
Chloride: 106 mmol/L (ref 98–110)
GFR, Est African American: >89 mL/min (ref >=60)
GFR, Est Non African American: 78 mL/min (ref >=60)
Glucose, Bld: 83 mg/dL (ref 65–99)
Potassium: 4.3 mmol/L (ref 3.5–5.3)
SODIUM: 143 mmol/L (ref 135–146)

## 2016-09-09 NOTE — Patient Instructions (Addendum)
Okay- free test with no obligation # [phone] MUST BE A MEMBER Call for store hours  Ask about pelvic floor PT   Aortic Valve Stenosis Aortic valve stenosis is a narrowing of the aortic valve. The aortic valve opens and closes to regulate blood flow between the lower left chamber of the heart (left ventricle) and the blood vessel that leads away from the heart (aorta). When the aortic valve becomes narrow, it makes it difficult for the heart to pump blood into the aorta, which causes the heart to work harder. The extra work can weaken the heart over time. Aortic valve stenosis can range from mild to severe. If untreated, it can become more severe over time and can lead to heart failure. What are the causes? This condition may be caused by:  Buildup of calcium around and on the valve. This can occur with aging. This is the most common cause of aortic valve stenosis.  Birth defect.  Rheumatic fever.  Radiation to the chest. What increases the risk? You may be more likely to develop this condition if:  You are over the age of 39.  You were born with an abnormal bicuspid valve. What are the signs or symptoms? You may have no symptoms until your condition becomes severe. It may take 10-20 years for mild or moderate aortic valve stenosis to become severe. Symptoms may include:  Shortness of breath. This may get worse during physical activity.  Feeling unusually weak and tired (fatigue).  Extreme discomfort in the chest, neck, or arm (angina).  A heartbeat that is irregular or faster than normal (palpitations).  Dizziness or fainting. This may happen when you get physically tired or after you take certain heart medicines, such as nitroglycerin. How is this diagnosed? This condition may be diagnosed with:  A physical exam.  Echocardiogram. This is a type of imaging test that uses sound waves (ultrasound) to make an image of your heart. There are two types  that may be used:  Transthoracic echocardiogram (TTE). This type of echocardiogram is noninvasive, and it is usually done first.  Transesophageal echocardiogram (TEE). This type of echocardiogram is done by passing a flexible tube down your esophagus. The heart and the esophagus are close to each other, so your health care provider can take very clear, detailed pictures of the heart using this type of test.  Cardiac catheterization. In this procedure, a thin, flexible tube (catheter) is passed through a large vein in your neck, groin, or arm. This procedure provides information about arteries, structures, blood pressure, and oxygen levels in your heart.  Electrocardiogram (ECG). This records the electrical impulses of your heart and assesses heart function.  Stress tests. These are tests that evaluate the blood supply to your heart and your heart's response to exercise.  Blood tests. You may work with a health care provider who specializes in the heart (cardiologist). How is this treated? Treatment depends on how severe your condition is and what your symptoms are. You will need to have your heart checked regularly to make sure that your condition is not getting worse or causing serious problems. If your condition is mild, no treatment may be needed. Treatment may include:  Medicines that help keep your heart rate regular.  Medicines that thin your blood (anticoagulants) to prevent the formation of blood clots.  Antibiotic medicines to help prevent infection.  Surgery to replace your aortic valve. This is the most common treatment for aortic valve stenosis. Several types  of surgeries are available. The surgery may be done through a large incision over your heart (open heart surgery), or it may be done using a minimally invasive technique (transcatheter aortic valve replacement, or TAVR). Follow these instructions at home: Lifestyle    Limit alcohol intake to no more than 1 drink per day  for nonpregnant women and 2 drinks per day for men. One drink equals 12 oz of beer, 5 oz of wine, or 1 oz of hard liquor.  Do not use any tobacco products, such as cigarettes, chewing tobacco, or e-cigarettes. If you need help quitting, ask your health care provider.  Work with your health care provider to manage your blood pressure and cholesterol.  Maintain a healthy weight. Eating and drinking   Follow instructions from your health care provider about eating or drinking restrictions.  Limit how much caffeine you drink. Caffeine can affect your heart's rate and rhythm.  Drink enough fluid to keep your urine clear or pale yellow.  Eat a heart-healthy diet. This should include plenty of fresh fruits and vegetables. If you eat meat, it should be lean cuts. Avoid foods that are:  High in salt, saturated fat, or sugar.  Canned or highly processed.  [place] Pro. Activity   Return to your normal activities as told by your health care provider. Ask your health care provider what activities are safe for you.  Exercise regularly, as told by your health care provider. Ask your health care provider what types of exercise are safe for you.  If your aortic valve stenosis is mild, you may need to avoid only very intense physical activity. The more severe your aortic valve stenosis is, the more activities you may need to avoid. General instructions   Take over-the-counter and prescription medicines only as told by your health care provider.  If you are a woman and you plan to become pregnant, talk with your health care provider before you become pregnant.  Tell all health care providers who care for you that you have aortic valve stenosis.  Keep all follow-up visits as told by your health care provider. This is important. Contact a health care provider if:  You have a fever. Get help right away if:  You develop chest pain or tightness.  You develop shortness of breath or difficulty  breathing.  You feel light-headed.  You feel like you might faint.  Your heartbeat is irregular or faster than normal. These symptoms may represent a serious problem that is an emergency. Do not wait to see if the symptoms will go away. Get medical help right away. Call your local emergency services (911 in the [place] Do not drive yourself to the hospital. This information is not intended to replace advice given to you by your health care provider. Make sure you discuss any questions you have with your health care provider. Document Released: 01/03/2003 Document Revised: 09/12/2015 Document Reviewed: 03/10/2015 Elsevier Interactive Patient Education  2017 [employer].

## 2016-09-10 LAB — MAGNESIUM: MAGNESIUM: 1.7 mg/dL (ref 1.5–2.5)

## 2016-09-10 LAB — VITAMIN D 25 HYDROXY (VIT D DEFICIENCY, FRACTURES): VIT D 25 HYDROXY: 87 ng/mL (ref 30–100)

## 2016-09-10 LAB — TSH: TSH: 3.34 m[IU]/L

## 2016-09-11 NOTE — Addendum Note (Signed)
Addended by: Vicie Mutters R on: 09/11/2016 12:46 PM   Modules accepted: Orders

## 2016-09-28 ENCOUNTER — Ambulatory Visit: Payer: PPO

## 2016-10-08 ENCOUNTER — Ambulatory Visit: Payer: PPO

## 2016-10-08 ENCOUNTER — Other Ambulatory Visit: Payer: PPO

## 2016-10-12 ENCOUNTER — Telehealth: Payer: Self-pay | Admitting: Cardiovascular Disease

## 2016-10-12 NOTE — Telephone Encounter (Signed)
Pt c/o Shortness Of Breath: STAT if SOB developed within the last 24 hours or pt is noticeably SOB on the phone  1. Are you currently SOB (can you hear that pt is SOB on the phone)? No   2. How long have you been experiencing SOB? A few days   3. Are you SOB when sitting or when up moving around? All the time  4. Are you currently experiencing any other symptoms? Chest pressure    Patient thinks she needs to come in sooner

## 2016-10-12 NOTE — Telephone Encounter (Signed)
Called patient about her message. Informed patient if she is having chest pressure and SOB she needs to go to ED. Patient stated she does not want to go to ED. Patient stated she wanted a sooner appointment with Dr. Johnsie Cancel. Informed patient the 1st available is in August. Informed patient she could see one of our PAs. Patient agreed to see Bhagat PA on 10/15/16. Encouraged patient to go to ED if her symptoms get worse. Patient verbalized understanding.

## 2016-10-15 ENCOUNTER — Ambulatory Visit (INDEPENDENT_AMBULATORY_CARE_PROVIDER_SITE_OTHER): Payer: PPO | Admitting: Physician Assistant

## 2016-10-15 ENCOUNTER — Encounter: Payer: Self-pay | Admitting: *Deleted

## 2016-10-15 ENCOUNTER — Encounter: Payer: Self-pay | Admitting: Physician Assistant

## 2016-10-15 VITALS — BP 124/62 | HR 80 | Ht 65.5 in | Wt 139.0 lb

## 2016-10-15 DIAGNOSIS — R0609 Other forms of dyspnea: Secondary | ICD-10-CM | POA: Diagnosis not present

## 2016-10-15 DIAGNOSIS — F419 Anxiety disorder, unspecified: Secondary | ICD-10-CM

## 2016-10-15 DIAGNOSIS — I350 Nonrheumatic aortic (valve) stenosis: Secondary | ICD-10-CM

## 2016-10-15 DIAGNOSIS — I10 Essential (primary) hypertension: Secondary | ICD-10-CM

## 2016-10-15 NOTE — Progress Notes (Signed)
Cardiology Office Note    Date:  10/15/2016   ID:  [name], DOB Mar 26, 1951, MRN [id number]  PCP:  Unk Pinto, MD  Cardiologist:  Dr. Johnsie Cancel   Chief Complaint: Chest pressure and SOB  History of Present Illness:   Kathryn Chavez is a 66 y.o. female with hx of HTN, HLD, breast cancer presents and moderate AS for chest pressure and SOB.   Seen once by Dr. Johnsie Cancel 01/2014 for evaluation of murmur. Follow-up echocardiogram showed normal LV function of 55-60%, no wall motion abnormality, grade 1 diastolic dysfunction, moderate aortic stenosis, Type 1R atrial septal aneurysm. A patent foramen ovale cannot be excluded. Advise follow-up with repeat echocardiogram in 6 months. However, never followed up for both.   Patient had an episode of severe shortness of breath with chest pressure a few days ago. She was really anxious and couldn't able to fall asleep and then later the night she developed a chest pressure and shortness of breath. Resolved spontaneously. She is under a lot of stress lately. She can walk miles without any shortness of breath. However, she becomes dyspneic on incline surface. No chest pain, palpitation, orthopnea, PND, syncope, dizziness, lower extremity edema or melena.  She has a history of hypertension and hyperlipidemia however not taking any medication. Both been very well controlled.  .  Past Medical History:  Diagnosis Date  . Allergy    rhinitis  . Aortic stenosis, moderate    on echo in 2015.  Marland Kitchen Cancer ([place])    breast  . Hyperlipidemia   . Hypertension     Past Surgical History:  Procedure Laterality Date  . ABDOMINAL HYSTERECTOMY    . BREAST LUMPECTOMY      Current Medications: Prior to Admission medications   Medication Sig Start Date End Date Taking? Authorizing Provider  ALPRAZolam (XANAX XR) 0.5 MG 24 hr tablet Take 1 tablet (0.5 mg total) by mouth daily. 08/20/16   Vicie Mutters, PA-C  azelastine (ASTELIN) 0.1 % nasal spray Place  2 sprays into both nostrils 2 (two) times daily. Use in each nostril as directed 12/02/15 12/01/16  Forcucci, Courtney, PA-C  fluticasone (FLONASE) 50 MCG/ACT nasal spray Place 2 sprays into both nostrils daily. 05/26/16   Forcucci, Courtney, PA-C  gabapentin (NEURONTIN) 100 MG capsule TAKE (1) CAPSULE THREE TIMES DAILY. 08/19/16   Vicie Mutters, PA-C    Allergies:   Penicillamine; Penicillins; Sulfamethoxazole; and Sulfonamide derivatives   Social History   Social History  . Marital status: Single    Spouse name: N/A  . Number of children: N/A  . Years of education: N/A   Social History Main Topics  . Smoking status: Never Smoker  . Smokeless tobacco: Never Used  . Alcohol use No     Comment: social  . Drug use: No  . Sexual activity: Yes    Partners: Male    Birth control/ protection: Surgical   Other Topics Concern  . None   Social History Narrative  . None     Family History:  The patient's family history includes Arthritis in her other; Breast cancer in her other; Breast cancer (age of onset: 60) in her daughter; Breast cancer (age of onset: 85) in her mother; Hyperlipidemia in her other; Lung cancer in her mother and other; Other in her father; Prostate cancer in her other.   ROS:   Please see the history of present illness.    ROS All other systems reviewed and are negative.  PHYSICAL EXAM:   VS:  BP 124/62   Pulse 80   Ht 5' 5.5" (1.664 m)   Wt 139 lb (63 kg)   BMI 22.78 kg/m    GEN: Well nourished, well developed, in no acute distress  HEENT: normal  Neck: no JVD, carotid bruits, or masses Cardiac: RRR; III-IV/VI systolic  murmurs, rubs, or gallops,no edema  Respiratory:  clear to auscultation bilaterally, normal work of breathing GI: soft, nontender, nondistended, + BS MS: no deformity or atrophy  Skin: warm and dry, no rash Neuro:  Alert and Oriented x 3, Strength and sensation are intact Psych: euthymic mood, full affect  Wt Readings from Last 3  Encounters:  10/15/16 139 lb (63 kg)  09/09/16 135 lb 6.4 oz (61.4 kg)  05/14/16 134 lb (60.8 kg)      Studies/Labs Reviewed:   EKG:  EKG is ordered today.  The ekg ordered today demonstrates Normal sinus rhythm at rate of 80 bpm with chronic right bundle branch block  Recent Labs: 09/09/2016: ALT 16; BUN 13; Creat 0.79; Hemoglobin 13.1; Magnesium 1.7; Platelets 213; Potassium 4.3; Sodium 143; TSH 3.34   Lipid Panel    Component Value Date/Time   CHOL 271 (H) 09/09/2016 1017   TRIG 118 09/09/2016 1017   HDL 119 09/09/2016 1017   CHOLHDL 2.3 09/09/2016 1017   VLDL 24 09/09/2016 1017   LDLCALC 128 (H) 09/09/2016 1017   LDLDIRECT 73.3 04/30/2011 0954    Additional studies/ records that were reviewed today include:   As above    ASSESSMENT & PLAN:   1. Dyspnea with chest pressure - Her episode  few days ago is most likely due to anxiety and stress related. No recurrent episode. She has no symptoms of heart failure.  2. Moderate aortic stenosis - Dyspnea on incline is likely related to AS. She can walk on a flat surface without any issue. No signs of heart failure. Will get echocardiogram to evaluate aortic valve.  3. Hypertension - Stable and well controlled without any medical regimen.  4. Hyperlipidemia - She is not on any medication. Recent lipid profile was normal.    Medication Adjustments/Labs and Tests Ordered: Current medicines are reviewed at length with the patient today.  Concerns regarding medicines are outlined above.  Medication changes, Labs and Tests ordered today are listed in the Patient Instructions below. Patient Instructions  Your physician recommends that you continue on your current medications as directed. Please refer to the Current Medication list given to you today.  Your physician has requested that you have an echocardiogram. Echocardiography is a painless test that uses sound waves to create images of your heart. It provides your doctor  with information about the size and shape of your heart and how well your heart's chambers and valves are working. This procedure takes approximately one hour. There are no restrictions for this procedure.   Your physician recommends that you schedule a follow-up appointment in: As Gwen Pounds, [place]  10/15/2016 4:09 PM    [facility] [place], [place], [place]  [address] Phone: [phone]; Fax: [phone]

## 2016-10-15 NOTE — Patient Instructions (Signed)
Your physician recommends that you continue on your current medications as directed. Please refer to the Current Medication list given to you today.  Your physician has requested that you have an echocardiogram. Echocardiography is a painless test that uses sound waves to create images of your heart. It provides your doctor with information about the size and shape of your heart and how well your heart's chambers and valves are working. This procedure takes approximately one hour. There are no restrictions for this procedure.   Your physician recommends that you schedule a follow-up appointment in: As SCHEDULED

## 2016-10-19 NOTE — Addendum Note (Signed)
Addended by: Mendel Ryder on: 10/19/2016 07:42 AM   Modules accepted: Orders

## 2016-10-20 ENCOUNTER — Other Ambulatory Visit: Payer: Self-pay | Admitting: Physician Assistant

## 2016-10-20 ENCOUNTER — Other Ambulatory Visit: Payer: Self-pay | Admitting: *Deleted

## 2016-10-26 ENCOUNTER — Ambulatory Visit
Admission: RE | Admit: 2016-10-26 | Discharge: 2016-10-26 | Disposition: A | Discharge status: Home / Self Care | Payer: PPO | Source: Ambulatory Visit | Attending: Internal Medicine | Admitting: Internal Medicine

## 2016-10-26 DIAGNOSIS — Z1231 Encounter for screening mammogram for malignant neoplasm of breast: Secondary | ICD-10-CM

## 2016-10-26 HISTORY — DX: Personal history of antineoplastic chemotherapy: Z92.21

## 2016-10-26 HISTORY — DX: Personal history of irradiation: Z92.3

## 2016-10-27 ENCOUNTER — Other Ambulatory Visit: Payer: Self-pay

## 2016-10-27 ENCOUNTER — Ambulatory Visit (HOSPITAL_COMMUNITY): Payer: PPO | Attending: Internal Medicine

## 2016-10-27 DIAGNOSIS — I350 Nonrheumatic aortic (valve) stenosis: Secondary | ICD-10-CM | POA: Diagnosis not present

## 2016-10-27 DIAGNOSIS — I083 Combined rheumatic disorders of mitral, aortic and tricuspid valves: Secondary | ICD-10-CM | POA: Insufficient documentation

## 2016-10-27 DIAGNOSIS — I253 Aneurysm of heart: Secondary | ICD-10-CM | POA: Diagnosis not present

## 2016-11-19 DIAGNOSIS — D485 Neoplasm of uncertain behavior of skin: Secondary | ICD-10-CM | POA: Diagnosis not present

## 2016-11-19 DIAGNOSIS — L570 Actinic keratosis: Secondary | ICD-10-CM | POA: Diagnosis not present

## 2016-11-19 DIAGNOSIS — Z85828 Personal history of other malignant neoplasm of skin: Secondary | ICD-10-CM | POA: Diagnosis not present

## 2016-12-07 ENCOUNTER — Other Ambulatory Visit: Payer: Self-pay | Admitting: Internal Medicine

## 2016-12-07 ENCOUNTER — Ambulatory Visit: Payer: PPO | Admitting: Cardiovascular Disease

## 2016-12-08 ENCOUNTER — Other Ambulatory Visit: Payer: Self-pay | Admitting: *Deleted

## 2016-12-08 DIAGNOSIS — J069 Acute upper respiratory infection, unspecified: Secondary | ICD-10-CM

## 2016-12-08 MED ORDER — AZELASTINE HCL 0.1 % NA SOLN: 2.0000 | Freq: Two times a day (BID) | NASAL | 2 refills | Status: DC

## 2016-12-28 DIAGNOSIS — L821 Other seborrheic keratosis: Secondary | ICD-10-CM | POA: Diagnosis not present

## 2016-12-28 DIAGNOSIS — L570 Actinic keratosis: Secondary | ICD-10-CM | POA: Diagnosis not present

## 2016-12-28 DIAGNOSIS — D2261 Melanocytic nevi of right upper limb, including shoulder: Secondary | ICD-10-CM | POA: Diagnosis not present

## 2016-12-28 DIAGNOSIS — D2262 Melanocytic nevi of left upper limb, including shoulder: Secondary | ICD-10-CM | POA: Diagnosis not present

## 2016-12-28 DIAGNOSIS — Z85828 Personal history of other malignant neoplasm of skin: Secondary | ICD-10-CM | POA: Diagnosis not present

## 2016-12-28 DIAGNOSIS — D225 Melanocytic nevi of trunk: Secondary | ICD-10-CM | POA: Diagnosis not present

## 2017-02-12 NOTE — Progress Notes (Signed)
Cardiology Office Note    Date:  02/15/2017   ID:  CALLAN YONTZ, DOB [birth year], MRN [id number]  PCP:  Unk Pinto, MD  Cardiologist:  Dr. Johnsie Cancel   Chief Complaint: Aortic Stenosis   History of Present Illness:   Kathryn Chavez is a 66 y.o. female with hx of HTN, HLD, breast cancer and moderate AS I have not Seen since 2015 Seen by PA June 2018 for chest pain and dyspnea   June 2018  had an episode of severe shortness of breath with chest pressure  She was really anxious and couldn't able to fall asleep and then later the night she developed a chest pressure and shortness of breath. Resolved spontaneously. She is under a lot of stress lately. She can walk miles without any shortness of breath. However, she becomes dyspneic on incline surface. No chest pain, palpitation, orthopnea, PND, syncope, dizziness, lower extremity edema or melena.  HTN/HLD diet controlled   Works at [employer] had nice trip to [place] for [place]  F/U echo reviewed 10/27/16   And stable gradients really only mild to moderate AS   .Study Conclusions  - Left ventricle: The cavity size was normal. Wall thickness was   increased in a pattern of mild LVH. Systolic function was normal.   The estimated ejection fraction was in the range of 55% to 60%.   Doppler parameters are consistent with abnormal left ventricular   relaxation (grade 1 diastolic dysfunction). The E/e&' ratio is   between 8-15, suggesting normal LV filling pressure. - Aortic valve: Calcified. Moderate stenosis and mild   regurgitation. Mean gradient (S): 15 mm Hg. Peak gradient (S): 28   mm Hg. Valve area (VTI): 1.08 cm^2. Valve area (Vmax): 1.09 cm^2. - Mitral valve: Calcified annulus. Mildly thickened leaflets . - Atrial septum: Type 1R atrial septal aneurysm. - Tricuspid valve: There was mild regurgitation. - Pulmonary arteries: PA peak pressure: 40 mm Hg (S). - Inferior vena cava: The vessel was normal in size. The  respirophasic diameter changes were in the normal range (>= 50%),   consistent with normal central venous pressure. - Impressions: Compared to a prior study in 2015, the mean gradient   across the aortic valve is comparable - there is moderate aortic   stenosis.  Impressions:  - Compared to a prior study in 2015, the mean gradient across the   aortic valve is comparable - there is moderate aortic stenosis.  Past Medical History:  Diagnosis Date  . Allergy    rhinitis  . Aortic stenosis, moderate    on echo in 2015.  Marland Kitchen Cancer ([place])    breast  . Hyperlipidemia   . Hypertension   . Personal history of chemotherapy 2000  . Personal history of radiation therapy 2000    Past Surgical History:  Procedure Laterality Date  . ABDOMINAL HYSTERECTOMY    . BREAST LUMPECTOMY Right 2000   malignant    Current Medications: Prior to Admission medications   Medication Sig Start Date End Date Taking? Authorizing Provider  ALPRAZolam (XANAX XR) 0.5 MG 24 hr tablet Take 1 tablet (0.5 mg total) by mouth daily. 08/20/16   Vicie Mutters, PA-C  azelastine (ASTELIN) 0.1 % nasal spray Place 2 sprays into both nostrils 2 (two) times daily. Use in each nostril as directed 12/02/15 12/01/16  Forcucci, Courtney, PA-C  fluticasone (FLONASE) 50 MCG/ACT nasal spray Place 2 sprays into both nostrils daily. 05/26/16   Forcucci, Courtney, PA-C  gabapentin (NEURONTIN)  100 MG capsule TAKE (1) CAPSULE THREE TIMES DAILY. 08/19/16   Vicie Mutters, PA-C    Allergies:   Penicillamine; Penicillins; Sulfamethoxazole; and Sulfonamide derivatives   Social History   Social History  . Marital status: Single    Spouse name: N/A  . Number of children: N/A  . Years of education: N/A   Social History Main Topics  . Smoking status: Never Smoker  . Smokeless tobacco: Never Used  . Alcohol use No     Comment: social  . Drug use: No  . Sexual activity: Yes    Partners: Male    Birth control/ protection: Surgical    Other Topics Concern  . None   Social History Narrative  . None     Family History:  The patient's family history includes Arthritis in her other; Breast cancer in her other; Breast cancer (age of onset: 45) in her daughter; Breast cancer (age of onset: 7) in her daughter; Breast cancer (age of onset: 43) in her mother; Hyperlipidemia in her other; Lung cancer in her mother and other; Other in her father; Prostate cancer in her other.   ROS:   Please see the history of present illness.    ROS All other systems reviewed and are negative.   PHYSICAL EXAM:   VS:  BP 108/78   Pulse 76   Ht 5\' 6"  (1.676 m)   Wt 139 lb 12 oz (63.4 kg)   SpO2 96%   BMI 22.56 kg/m    Affect appropriate Healthy:  appears stated age 73: normal Neck supple with no adenopathy JVP normal no bruits no thyromegaly Lungs clear with no wheezing and good diaphragmatic motion Heart:  S1/S2 AS  murmur, no rub, gallop or click PMI normal Abdomen: benighn, BS positve, no tenderness, no AAA no bruit.  No HSM or HJR Distal pulses intact with no bruits No edema Neuro non-focal Skin warm and dry No muscular weakness   Wt Readings from Last 3 Encounters:  02/15/17 139 lb 12 oz (63.4 kg)  10/15/16 139 lb (63 kg)  09/09/16 135 lb 6.4 oz (61.4 kg)      Studies/Labs Reviewed:   EKG:  EKG is ordered today.  The ekg ordered today demonstrates Normal sinus rhythm at rate of 80 bpm with chronic right bundle branch block  Recent Labs: 09/09/2016: ALT 16; BUN 13; Creat 0.79; Hemoglobin 13.1; Magnesium 1.7; Platelets 213; Potassium 4.3; Sodium 143; TSH 3.34   Lipid Panel    Component Value Date/Time   CHOL 271 (H) 09/09/2016 1017   TRIG 118 09/09/2016 1017   HDL 119 09/09/2016 1017   CHOLHDL 2.3 09/09/2016 1017   VLDL 24 09/09/2016 1017   LDLCALC 128 (H) 09/09/2016 1017   LDLDIRECT 73.3 04/30/2011 0954    Additional studies/ records that were reviewed today include:   As above    ASSESSMENT &  PLAN:   1. Dyspnea with chest pressure - resolved echo with no progression of AS observe   2. Moderate aortic stenosis - stable f/u echo July 2019    3. Hypertension - Stable and well controlled without any medical regimen.  4. Hyperlipidemia - She is not on any medication. Recent lipid profile was normal.    Jenkins Rouge

## 2017-02-15 ENCOUNTER — Encounter: Payer: Self-pay | Admitting: Cardiovascular Disease

## 2017-02-15 ENCOUNTER — Ambulatory Visit (INDEPENDENT_AMBULATORY_CARE_PROVIDER_SITE_OTHER): Payer: PPO | Admitting: Cardiovascular Disease

## 2017-02-15 VITALS — BP 108/78 | HR 76 | Ht 66.0 in | Wt 139.8 lb

## 2017-02-15 DIAGNOSIS — I350 Nonrheumatic aortic (valve) stenosis: Secondary | ICD-10-CM

## 2017-02-15 NOTE — Patient Instructions (Addendum)

## 2017-02-24 ENCOUNTER — Encounter: Payer: Self-pay | Admitting: Internal Medicine

## 2017-03-02 ENCOUNTER — Other Ambulatory Visit: Payer: Self-pay

## 2017-03-02 MED ORDER — VALACYCLOVIR HCL 1 G PO TABS: 1000.0000 mg | ORAL_TABLET | Freq: Three times a day (TID) | ORAL | 3 refills | Status: DC

## 2017-04-14 ENCOUNTER — Encounter: Payer: Self-pay | Admitting: Adult Health

## 2017-04-14 ENCOUNTER — Ambulatory Visit: Payer: PPO | Admitting: Adult Health

## 2017-04-14 VITALS — BP 110/72 | HR 73 | Temp 97.2°F | Ht 66.0 in | Wt 142.2 lb

## 2017-04-14 DIAGNOSIS — R21 Rash and other nonspecific skin eruption: Secondary | ICD-10-CM

## 2017-04-14 MED ORDER — PREDNISONE 20 MG PO TABS: ORAL_TABLET | ORAL | 0 refills | Status: DC

## 2017-04-14 NOTE — Progress Notes (Signed)
Assessment and Plan:  Kathryn Chavez was seen today for leg swelling and rash.  Diagnoses and all orders for this visit:  Rash and nonspecific skin eruption Will try a systemic steroid, impression is noninfectious on exam- dermatitis vs possible vascular inflammation. Will have her schedule to see her dermatologist towards the end of the week if not improved with steroid. She is to stop using new cream, review household products and stop any new items in the past month. She is to reach out if steroid worsens symptoms or any new/concerning symptoms.  -     predniSONE (DELTASONE) 20 MG tablet; 2 tablets daily for 3 days, 1 tablet daily for 4 days.  Further disposition pending results of labs. Discussed med's effects and SE's.   Over 15 minutes of exam, counseling, chart review, and critical decision making was performed.   Future Appointments  Date Time Provider [place]  04/27/2017  9:00 AM Unk Pinto, MD [facility] None  02/22/2018  8:30 AM [id number]    ------------------------------------------------------------------------------------------------------------------   HPI Ht 5\' 6"  (1.676 m)   Wt 142 lb 3.2 oz (64.5 kg)   BMI 22.95 kg/m   66 y.o.female presents for rash of bilateral lower extremities x 2 days. She reports she noted this began as mild swelling of her L foot, now has mild swelling in both feet, and has scattered red discoloration/non-raised rash of bilateral lower extremities. She denies itching/pain, discharges.  She reports she went to a pedicure earlier this month 12/8 - She reports she did use a new cream she received for Christmas.   Past Medical History:  Diagnosis Date  . Allergy    rhinitis  . Aortic stenosis, moderate    on echo in 2015.  Marland Kitchen Cancer ([place])    breast  . Hyperlipidemia   . Hypertension   . Personal history of chemotherapy 2000  . Personal history of radiation therapy 2000     Allergies  Allergen  Reactions  . Penicillamine Hives  . Penicillins     REACTION: Hives  . Sulfamethoxazole Hives  . Sulfonamide Derivatives     REACTION: Hives    Current Outpatient Medications on File Prior to Visit  Medication Sig  . ALPRAZolam (XANAX XR) 0.5 MG 24 hr tablet Take 1 tablet (0.5 mg total) by mouth daily. (Patient taking differently: Take 0.5 mg by mouth as needed. )  . aspirin EC 81 MG tablet Take 81 mg by mouth daily.  . fexofenadine (ALLEGRA) 60 MG tablet Take 60 mg by mouth 2 (two) times daily.  Marland Kitchen gabapentin (NEURONTIN) 100 MG capsule Take 100 mg by mouth 2 (two) times daily.  Marland Kitchen azelastine (ASTELIN) 0.1 % nasal spray Place 2 sprays into both nostrils 2 (two) times daily. Use in each nostril as directed (Patient not taking: Reported on 04/14/2017)  . fluticasone (FLONASE) 50 MCG/ACT nasal spray Place 2 sprays into both nostrils daily. (Patient not taking: Reported on 04/14/2017)  . valACYclovir (VALTREX) 1000 MG tablet Take 1 tablet (1,000 mg total) 3 (three) times daily by mouth. (Patient not taking: Reported on 04/14/2017)   Current Facility-Administered Medications on File Prior to Visit  Medication  . dexamethasone (DECADRON) injection 10 mg    ROS: Review of Systems  Constitutional: Negative for chills, diaphoresis, fever and malaise/fatigue.  HENT: Negative.   Respiratory: Negative for cough, shortness of breath and wheezing.   Cardiovascular: Positive for leg swelling. Negative for chest pain, palpitations and orthopnea.  Musculoskeletal: Negative for  falls, joint pain and myalgias.  Skin: Positive for rash. Negative for itching.  Neurological: Negative for dizziness, tingling, sensory change, focal weakness, weakness and headaches.  Endo/Heme/Allergies: Does not bruise/bleed easily.    Physical Exam:  Ht 5\' 6"  (1.676 m)   Wt 142 lb 3.2 oz (64.5 kg)   BMI 22.95 kg/m   General Appearance: Well nourished, in no apparent distress. Neck: Supple.  Respiratory:  Respiratory effort normal, BS equal bilaterally without rales, rhonchi, wheezing or stridor.  Cardio: RRR with no MRGs. Brisk peripheral pulses with very mild nonpitting edema of foot/ankle bilaterally - marginally worse in left.  Abdomen: Soft, + BS.  Non tender, no guarding, rebound, hernias, masses. Lymphatics: Non tender without lymphadenopathy.  Musculoskeletal: normal gait.  Skin: Warm, dry without lesions, ecchymosis, discharge - scattered round areas of red non-raised red discoloration to bilateral lower extremities approx 1-2 cm areas - ankle to calf. Not warm to touch. Feet/toe nails intact without discoloration, thickening of nails.  Neuro: Sensation intact.  Psych: Awake and oriented X 3, normal affect, Insight and Judgment appropriate.     Izora Ribas, NP 11:17 AM [name]

## 2017-04-14 NOTE — Patient Instructions (Signed)
Rash A rash is a change in the color of the skin. A rash can also change the way your skin feels. There are many different conditions and factors that can cause a rash. Follow these instructions at home: Pay attention to any changes in your symptoms. Follow these instructions to help with your condition: Medicine Take or apply over-the-counter and prescription medicines only as told by your health care provider. These may include:  Corticosteroid cream.  Anti-itch lotions.  Oral antihistamines.  Skin Care  Apply cool compresses to the affected areas.  Try taking a bath with: ? Epsom salts. Follow the instructions on the packaging. You can get these at your local pharmacy or grocery store. ? Baking soda. Pour a small amount into the bath as told by your health care provider. ? Colloidal oatmeal. Follow the instructions on the packaging. You can get this at your local pharmacy or grocery store.  Try applying baking soda paste to your skin. Stir water into baking soda until it reaches a paste-like consistency.  Do not scratch or rub your skin.  Avoid covering the rash. Make sure the rash is exposed to air as much as possible. General instructions  Avoid hot showers or baths, which can make itching worse. A cold shower may help.  Avoid scented soaps, detergents, and perfumes. Use gentle soaps, detergents, perfumes, and other cosmetic products.  Avoid any substance that causes your rash. Keep a journal to help track what causes your rash. Write down: ? What you eat. ? What cosmetic products you use. ? What you drink. ? What you wear. This includes jewelry.  Keep all follow-up visits as told by your health care provider. This is important. Contact a health care provider if:  You sweat at night.  You lose weight.  You urinate more than normal.  You feel weak.  You vomit.  Your skin or the whites of your eyes look yellow (jaundice).  Your skin: ? Tingles. ? Is  numb.  Your rash: ? Does not go away after several days. ? Gets worse.  You are: ? Unusually thirsty. ? More tired than normal.  You have: ? New symptoms. ? Pain in your abdomen. ? A fever. ? Diarrhea. Get help right away if:  You develop a rash that covers all or most of your body. The rash may or may not be painful.  You develop blisters that: ? Are on top of the rash. ? Grow larger or grow together. ? Are painful. ? Are inside your nose or mouth.  You develop a rash that: ? Looks like purple pinprick-sized spots all over your body. ? Has a "bull's eye" or looks like a target. ? Is not related to sun exposure, is red and painful, and causes your skin to peel. This information is not intended to replace advice given to you by your health care provider. Make sure you discuss any questions you have with your health care provider. Document Released: 03/27/2002 Document Revised: 09/10/2015 Document Reviewed: 08/22/2014 Elsevier Interactive Patient Education  2018 Elsevier Inc.  

## 2017-04-21 ENCOUNTER — Other Ambulatory Visit: Payer: Self-pay | Admitting: Physician Assistant

## 2017-04-27 ENCOUNTER — Ambulatory Visit (INDEPENDENT_AMBULATORY_CARE_PROVIDER_SITE_OTHER): Payer: PPO | Admitting: Internal Medicine

## 2017-04-27 ENCOUNTER — Encounter: Payer: Self-pay | Admitting: Internal Medicine

## 2017-04-27 VITALS — BP 136/82 | HR 72 | Temp 97.5°F | Resp 16 | Ht 65.5 in | Wt 140.2 lb

## 2017-04-27 DIAGNOSIS — Z0000 Encounter for general adult medical examination without abnormal findings: Secondary | ICD-10-CM | POA: Diagnosis not present

## 2017-04-27 DIAGNOSIS — E559 Vitamin D deficiency, unspecified: Secondary | ICD-10-CM | POA: Diagnosis not present

## 2017-04-27 DIAGNOSIS — E782 Mixed hyperlipidemia: Secondary | ICD-10-CM | POA: Diagnosis not present

## 2017-04-27 DIAGNOSIS — R7309 Other abnormal glucose: Secondary | ICD-10-CM

## 2017-04-27 DIAGNOSIS — Z1212 Encounter for screening for malignant neoplasm of rectum: Secondary | ICD-10-CM

## 2017-04-27 DIAGNOSIS — I350 Nonrheumatic aortic (valve) stenosis: Secondary | ICD-10-CM

## 2017-04-27 DIAGNOSIS — Z136 Encounter for screening for cardiovascular disorders: Secondary | ICD-10-CM

## 2017-04-27 DIAGNOSIS — Z0001 Encounter for general adult medical examination with abnormal findings: Secondary | ICD-10-CM

## 2017-04-27 DIAGNOSIS — I10 Essential (primary) hypertension: Secondary | ICD-10-CM | POA: Diagnosis not present

## 2017-04-27 DIAGNOSIS — Z79899 Other long term (current) drug therapy: Secondary | ICD-10-CM

## 2017-04-27 DIAGNOSIS — Z1211 Encounter for screening for malignant neoplasm of colon: Secondary | ICD-10-CM

## 2017-04-27 NOTE — Progress Notes (Signed)
[facility] Unk Pinto, M.D.     Uvaldo Bristle. Silverio Lay, P.A.-C Liane Comber, [place] [address] [place] [address] Telephone [phone] Telefax [phone] Annual Screening/Preventative Visit & Comprehensive Evaluation &  Examination     This very nice 67 y.o. DWF presents for a Screening/Preventative Visit & comprehensive evaluation and management of multiple medical co-morbidities.  Patient has been followed for labile HTN, Prediabetes, Hyperlipidemia and Vitamin D Deficiency. She also has significant hx/o R lumpectomy in 2001 treated with Chemoradiation and followed with 5 years of Arimidex.Patient has been treated with Gabapentin for shingles neuralgia of the Left eye (2014) post Shingles of the Lt face     Patient has been followed expectantly for labile  HTN. Patient's BP has been controlled at home and patient denies any cardiac symptoms as chest pain, palpitations, shortness of breath, dizziness or ankle swelling. Today's BP is at goal - 136/82 . Patient is followed by Dr Johnsie Cancel for moderate Ao Stenosis.      Patient's hyperlipidemia is not controlled with diet and medications. Patient denies myalgias or other medication SE's. Last lipids were not at goal: Lab Results  Component Value Date   CHOL 271 (H) 09/09/2016   HDL 119 09/09/2016   LDLCALC 128 (H) 09/09/2016   TRIG 118 09/09/2016   CHOLHDL 2.3 09/09/2016      Patient is followed expectantly for prediabetes and patient denies reactive hypoglycemic symptoms, visual blurring, diabetic polys, or paresthesias. Last A1c was Normal & at goal: Lab Results  Component Value Date   HGBA1C 5.1 01/29/2016      Finally, patient has history of Vitamin D Deficiency ("25"/2016) and last Vitamin D was at goal: Lab Results  Component Value Date   VD25OH 87 09/09/2016   Current Outpatient Medications on File Prior to Visit  Medication Sig  .  ALPRAZolam (XANAX XR) 0.5 MG 24 hr tablet TAKE 1 TABLET EACH DAY.  Marland Kitchen aspirin EC 81 MG tablet Take 81 mg by mouth daily.  . fexofenadine (ALLEGRA) 60 MG tablet Take 60 mg by mouth 2 (two) times daily.  . fluticasone (FLONASE) 50 MCG/ACT nasal spray Place 2 sprays into both nostrils daily.  Marland Kitchen gabapentin (NEURONTIN) 100 MG capsule Take 100 mg by mouth 2 (two) times daily.  . valACYclovir (VALTREX) 1000 MG tablet Take 1 tablet (1,000 mg total) 3 (three) times daily by mouth.   Allergies  Allergen Reactions  . Penicillamine Hives  . Penicillins     REACTION: Hives  . Sulfamethoxazole Hives  . Sulfonamide Derivatives     REACTION: Hives   Past Medical History:  Diagnosis Date  . Allergy    rhinitis  . Aortic stenosis, moderate    on echo in 2015.  Marland Kitchen Cancer ([place])    breast  . Hyperlipidemia   . Hypertension   . Personal history of chemotherapy 2000  . Personal history of radiation therapy 2000   Health Maintenance  Topic Date Due  . Hepatitis C Screening  13-Jun-1950  . DEXA SCAN  05/20/2014  . INFLUENZA VACCINE  12/28/2017 (Originally 11/18/2016)  . PNA vac Low Risk Adult (2 of 2 - PCV13) 02/24/2018 (Originally 01/16/2016)  . MAMMOGRAM  10/27/2018  . TETANUS/TDAP  10/27/2020  . COLONOSCOPY  03/14/2022   Immunization History  Administered Date(s) Administered  . Influenza Split 02/26/2011, 03/11/2012  . Influenza, High Dose Seasonal PF 01/29/2016  . Influenza,inj,Quad PF,6+ Mos 05/02/2013  . Pneumococcal Polysaccharide-23 01/16/2015  .  Tdap 10/28/2010   Last Colon -  Last Pap -  Past Surgical History:  Procedure Laterality Date  . ABDOMINAL HYSTERECTOMY    . BREAST LUMPECTOMY Right 2000   malignant   Family History  Problem Relation Age of Onset  . Breast cancer Mother 60  . Lung cancer Mother   . Other Father        BRAIN TUMOR  . Breast cancer Daughter 4  . Arthritis Other   . Hyperlipidemia Other   . Lung cancer Other   . Prostate cancer Other   . Breast  cancer Other   . Breast cancer Daughter 107   Social History   Tobacco Use  . Smoking status: Never Smoker  . Smokeless tobacco: Never Used  Substance Use Topics  . Alcohol use: No    Comment: social  . Drug use: No    ROS Constitutional: Denies fever, chills, weight loss/gain, headaches, insomnia,  night sweats, and change in appetite. Does c/o fatigue. Eyes: Denies redness, blurred vision, diplopia, discharge, itchy, watery eyes.  ENT: Denies discharge, congestion, post nasal drip, epistaxis, sore throat, earache, hearing loss, dental pain, Tinnitus, Vertigo, Sinus pain, snoring.  Cardio: Denies chest pain, palpitations, irregular heartbeat, syncope, dyspnea, diaphoresis, orthopnea, PND, claudication, edema Respiratory: denies cough, dyspnea, DOE, pleurisy, hoarseness, laryngitis, wheezing.  Gastrointestinal: Denies dysphagia, heartburn, reflux, water brash, pain, cramps, nausea, vomiting, bloating, diarrhea, constipation, hematemesis, melena, hematochezia, jaundice, hemorrhoids Genitourinary: Denies dysuria, frequency, urgency, nocturia, hesitancy, discharge, hematuria, flank pain Breast: Breast lumps, nipple discharge, bleeding.  Musculoskeletal: Denies arthralgia, myalgia, stiffness, Jt. Swelling, pain, limp, and strain/sprain. Denies falls. Skin: Denies puritis, rash, hives, warts, acne, eczema, changing in skin lesion Neuro: No weakness, tremor, incoordination, spasms, paresthesia, pain Psychiatric: Denies confusion, memory loss, sensory loss. Denies Depression. Endocrine: Denies change in weight, skin, hair change, nocturia, and paresthesia, diabetic polys, visual blurring, hyper / hypo glycemic episodes.  Heme/Lymph: No excessive bleeding, bruising, enlarged lymph nodes.  Physical Exam  BP 136/82   Pulse 72   Temp (!) 97.5 F (36.4 C)   Resp 16   Ht 5' 5.5" (1.664 m)   Wt 140 lb 3.2 oz (63.6 kg)   BMI 22.98 kg/m   General Appearance: Well nourished, well groomed and  in no apparent distress.  Eyes: PERRLA, EOMs, conjunctiva no swelling or erythema, normal fundi and vessels. Sinuses: No frontal/maxillary tenderness ENT/Mouth: EACs patent / TMs  nl. Nares clear without erythema, swelling, mucoid exudates. Oral hygiene is good. No erythema, swelling, or exudate. Tongue normal, non-obstructing. Tonsils not swollen or erythematous. Hearing normal.  Neck: Supple, thyroid normal. No bruits, nodes or JVD. Respiratory: Respiratory effort normal.  BS equal and clear bilateral without rales, rhonci, wheezing or stridor. Cardio: Heart sounds are normal with regular rate and rhythm and no murmurs, rubs or gallops. Peripheral pulses are normal and equal bilaterally without edema. No aortic or femoral bruits. Chest: symmetric with normal excursions and percussion. Breasts: Symmetric, without lumps, nipple discharge, retractions, or fibrocystic changes.  Abdomen: Flat, soft with bowel sounds active. Nontender, no guarding, rebound, hernias, masses, or organomegaly.  Lymphatics: Non tender without lymphadenopathy.  Genitourinary:  Musculoskeletal: Full ROM all peripheral extremities, joint stability, 5/5 strength, and normal gait. Skin: Warm and dry without rashes, lesions, cyanosis, clubbing or  ecchymosis.  Neuro: Cranial nerves intact, reflexes equal bilaterally. Normal muscle tone, no cerebellar symptoms. Sensation intact.  Pysch: Alert and oriented X 3, normal affect, Insight and Judgment appropriate.   Assessment and Plan  1. Annual Preventative Screening Examination  2. Essential hypertension  - EKG 12-Lead - Urinalysis, Routine w reflex microscopic - Microalbumin / creatinine urine ratio - CBC with Differential/Platelet - BASIC METABOLIC PANEL WITH GFR - Magnesium - TSH  3. Hyperlipidemia, mixed  - EKG 12-Lead - Hepatic function panel - Lipid panel - TSH  4. Abnormal glucose  - EKG 12-Lead - Hemoglobin A1c - Insulin, random  5. Vitamin D  deficiency  - VITAMIN D 25 Hydroxy  6. Aortic stenosis, moderate  - EKG 12-Lead  7. Screening for colorectal cancer  - POC Hemoccult Bld/Stl  8. Screening for ischemic heart disease  - EKG 12-Lead  9. Medication management  - Urinalysis, Routine w reflex microscopic - Microalbumin / creatinine urine ratio - CBC with Differential/Platelet - BASIC METABOLIC PANEL WITH GFR - Hepatic function panel - Magnesium - Lipid panel - TSH - Hemoglobin A1c - Insulin, random - VITAMIN D 25 Hydroxy         Patient was counseled in prudent diet to achieve/maintain BMI less than 25 for weight control, BP monitoring, regular exercise and medications. Discussed med's effects and SE's. Screening labs and tests as requested with regular follow-up as recommended. Over 40 minutes of exam, counseling, chart review and high complex critical decision making was performed.

## 2017-04-27 NOTE — Patient Instructions (Signed)

## 2017-04-28 LAB — BASIC METABOLIC PANEL WITH GFR
BUN: 20 mg/dL (ref 7–25)
CO2: 31 mmol/L (ref 20–32)
CREATININE: 0.83 mg/dL (ref 0.50–0.99)
Calcium: 9.7 mg/dL (ref 8.6–10.4)
Chloride: 104 mmol/L (ref 98–110)
GFR, EST AFRICAN AMERICAN: 85 mL/min/{1.73_m2} (ref >=60)
GFR, EST NON AFRICAN AMERICAN: 73 mL/min/{1.73_m2} (ref >=60)
GLUCOSE: 84 mg/dL (ref 65–99)
Potassium: 4.6 mmol/L (ref 3.5–5.3)
Sodium: 143 mmol/L (ref 135–146)

## 2017-04-28 LAB — HEPATIC FUNCTION PANEL
AG RATIO: 2.4 (calc) (ref 1.0–2.5)
ALT: 14 U/L (ref 6–29)
AST: 18 U/L (ref 10–35)
Albumin: 4.3 g/dL (ref 3.6–5.1)
Alkaline phosphatase (APISO): 74 U/L (ref 33–130)
BILIRUBIN DIRECT: 0.1 mg/dL (ref 0.0–0.2)
BILIRUBIN INDIRECT: 0.4 mg/dL (ref 0.2–1.2)
BILIRUBIN TOTAL: 0.5 mg/dL (ref 0.2–1.2)
GLOBULIN: 1.8 g/dL — AB (ref 1.9–3.7)
TOTAL PROTEIN: 6.1 g/dL (ref 6.1–8.1)

## 2017-04-28 LAB — URINALYSIS, ROUTINE W REFLEX MICROSCOPIC
Bacteria, UA: NONE SEEN /HPF
Bilirubin Urine: NEGATIVE
GLUCOSE, UA: NEGATIVE
Hgb urine dipstick: NEGATIVE
Hyaline Cast: NONE SEEN /LPF
Ketones, ur: NEGATIVE
NITRITE: NEGATIVE
Protein, ur: NEGATIVE
RBC / HPF: NONE SEEN /HPF (ref 0–2)
Specific Gravity, Urine: 1.004 (ref 1.001–1.03)
pH: 7 (ref 5.0–8.0)

## 2017-04-28 LAB — CBC WITH DIFFERENTIAL/PLATELET
BASOS PCT: 0.7 %
Basophils Absolute: 49 cells/uL (ref 0–200)
EOS ABS: 140 {cells}/uL (ref 15–500)
Eosinophils Relative: 2 %
HCT: 38.9 % (ref 35.0–45.0)
Hemoglobin: 12.9 g/dL (ref 11.7–15.5)
Lymphs Abs: 2611 cells/uL (ref 850–3900)
MCH: 31.3 pg (ref 27.0–33.0)
MCHC: 33.2 g/dL (ref 32.0–36.0)
MCV: 94.4 fL (ref 80.0–100.0)
MONOS PCT: 8.8 %
MPV: 9.7 fL (ref 7.5–12.5)
NEUTROS PCT: 51.2 %
Neutro Abs: 3584 cells/uL (ref 1500–7800)
Platelets: 220 10*3/uL (ref 140–400)
RBC: 4.12 10*6/uL (ref 3.80–5.10)
RDW: 12.7 % (ref 11.0–15.0)
TOTAL LYMPHOCYTE: 37.3 %
WBC: 7 10*3/uL (ref 3.8–10.8)
WBCMIX: 616 {cells}/uL (ref 200–950)

## 2017-04-28 LAB — MICROALBUMIN / CREATININE URINE RATIO: CREATININE, URINE: 18 mg/dL — AB (ref 20–275)

## 2017-04-28 LAB — HEMOGLOBIN A1C
Hgb A1c MFr Bld: 5.2 % of total Hgb (ref <5.7)
Mean Plasma Glucose: 103 (calc)
eAG (mmol/L): 5.7 (calc)

## 2017-04-28 LAB — LIPID PANEL
CHOL/HDL RATIO: 1.8 (calc) (ref <5.0)
Cholesterol: 270 mg/dL — ABNORMAL HIGH (ref <200)
HDL: 146 mg/dL (ref >50)
LDL CHOLESTEROL (CALC): 107 mg/dL — AB
NON-HDL CHOLESTEROL (CALC): 124 mg/dL (ref <130)
Triglycerides: 80 mg/dL (ref <150)

## 2017-04-28 LAB — TSH: TSH: 4.34 m[IU]/L (ref 0.40–4.50)

## 2017-04-28 LAB — INSULIN, RANDOM: Insulin: 4.7 u[IU]/mL (ref 2.0–19.6)

## 2017-04-28 LAB — VITAMIN D 25 HYDROXY (VIT D DEFICIENCY, FRACTURES): Vit D, 25-Hydroxy: 64 ng/mL (ref 30–100)

## 2017-04-28 LAB — MAGNESIUM: Magnesium: 2 mg/dL (ref 1.5–2.5)

## 2017-05-07 ENCOUNTER — Telehealth: Payer: Self-pay | Admitting: *Deleted

## 2017-05-07 NOTE — Telephone Encounter (Signed)
Patient informed of her lab results. 

## 2017-05-17 DIAGNOSIS — H16223 Keratoconjunctivitis sicca, not specified as Sjogren's, bilateral: Secondary | ICD-10-CM | POA: Diagnosis not present

## 2017-05-28 ENCOUNTER — Other Ambulatory Visit: Payer: Self-pay | Admitting: Internal Medicine

## 2017-07-09 DIAGNOSIS — L309 Dermatitis, unspecified: Secondary | ICD-10-CM | POA: Diagnosis not present

## 2017-07-09 DIAGNOSIS — Z85828 Personal history of other malignant neoplasm of skin: Secondary | ICD-10-CM | POA: Diagnosis not present

## 2017-07-15 DIAGNOSIS — H04123 Dry eye syndrome of bilateral lacrimal glands: Secondary | ICD-10-CM | POA: Diagnosis not present

## 2017-07-22 DIAGNOSIS — L820 Inflamed seborrheic keratosis: Secondary | ICD-10-CM | POA: Diagnosis not present

## 2017-07-22 DIAGNOSIS — D225 Melanocytic nevi of trunk: Secondary | ICD-10-CM | POA: Diagnosis not present

## 2017-07-22 DIAGNOSIS — L570 Actinic keratosis: Secondary | ICD-10-CM | POA: Diagnosis not present

## 2017-07-22 DIAGNOSIS — Z85828 Personal history of other malignant neoplasm of skin: Secondary | ICD-10-CM | POA: Diagnosis not present

## 2017-07-22 DIAGNOSIS — D2371 Other benign neoplasm of skin of right lower limb, including hip: Secondary | ICD-10-CM | POA: Diagnosis not present

## 2017-07-22 DIAGNOSIS — D2271 Melanocytic nevi of right lower limb, including hip: Secondary | ICD-10-CM | POA: Diagnosis not present

## 2017-07-22 DIAGNOSIS — L821 Other seborrheic keratosis: Secondary | ICD-10-CM | POA: Diagnosis not present

## 2017-07-22 DIAGNOSIS — L309 Dermatitis, unspecified: Secondary | ICD-10-CM | POA: Diagnosis not present

## 2017-07-26 ENCOUNTER — Other Ambulatory Visit: Payer: Self-pay | Admitting: Internal Medicine

## 2017-08-04 NOTE — Progress Notes (Signed)
MEDICARE ANNUAL WELLNESS VISIT AND FOLLOW UP  Assessment:   Diagnoses and all orders for this visit:  Encounter for Medicare annual wellness exam  Essential hypertension At goal without medications at this time Monitor blood pressure at home; call if consistently over 130/80 Continue DASH diet.   Reminder to go to the ER if any CP, SOB, nausea, dizziness, severe HA, changes vision/speech, left arm numbness and tingling and jaw pain.  Mixed hyperlipidemia Very nearly at goal by lifestyle and RYR  Continue low cholesterol diet and exercise.  Check lipid panel.   BMI 22.0-22.9, adult Continue to recommend diet heavy in fruits and veggies and low in animal meats, cheeses, and dairy products, appropriate calorie intake Discuss exercise recommendations routinely Continue to monitor weight at each visit  Aortic stenosis, moderate Followed by Dr. Johnsie Cancel  Seasonal allergic rhinitis, unspecified trigger - Allegra OTC, increase H20, allergy hygiene explained.  Vitamin D deficient osteomalacia Vitamin D at goal at recent check; continue to recommend supplementation for goal of 70-100 Defer vitamin D level Continue with calcium in diet Weight bearing exercises advised Last DEXA remote; will order today  Female cystocele Plans to have surgery this fall   Depression/anxiety Start new medication as prescribed - wellbutrin 150 mg XR, xanax 0.25-0.5 mg  Stress management techniques discussed, increase water, good sleep hygiene discussed, increase exercise, and increase veggies.  Follow up 1 month, call the office if any new AE's from medications and we will switch them   Over 40 minutes of exam, counseling, chart review and critical decision making was performed Future Appointments  Date Time Provider [place]  11/08/2017  9:30 AM Unk Pinto, MD [facility] None  02/22/2018  8:30 AM [facility]  05/16/2018  9:00 AM Unk Pinto, MD  [facility] None     Plan:   During the course of the visit the patient was educated and counseled about appropriate screening and preventive services including:    Pneumococcal vaccine   Prevnar 13  Influenza vaccine  Td vaccine  Screening electrocardiogram  Bone densitometry screening  Colorectal cancer screening  Diabetes screening  Glaucoma screening  Nutrition counseling   Advanced directives: requested   Subjective:  Kathryn Chavez is a 67 y.o. female who presents for Medicare Annual Wellness Visit and 3 month follow up. She also has significant hx/o R lumpectomy in 2001 treated with Chemoradiation and followed with 5 years of Arimidex. She is followed by Dr. Johnsie Cancel for moderate aortic stenosis. 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   She has been prescribed xanax 1 mg XR for depression with anxiety, but she reports she has not been taking as long acting agent has been too sedating, and would like to try wellbutrin as a daily agent as she did not tolerate zoloft in the past, but her daughter has done well on this agent. She does feel that having PRN short acting xanax available would be beneficial. Risks and SE were discussed at length, will trial a very low dose of both medications and follow up closely.   BMI is Body mass index is 23.11 kg/m., she has been working on diet and exercise. Wt Readings from Last 3 Encounters:  08/05/17 141 lb (64 kg)  04/27/17 140 lb 3.2 oz (63.6 kg)  04/14/17 142 lb 3.2 oz (64.5 kg)    Her blood pressure has been controlled at home, today their BP  is BP: 130/88 She does workout. She denies chest pain, shortness of breath, dizziness.   She is not on cholesterol medication and denies myalgias. Her cholesterol is not at goal. The cholesterol last visit was:   Lab Results  Component Value Date   CHOL 270 (H) 04/27/2017   HDL 146 04/27/2017   LDLCALC 107  (H) 04/27/2017   LDLDIRECT 73.3 04/30/2011   TRIG 80 04/27/2017   CHOLHDL 1.8 04/27/2017    Last A1C in the office was:  Lab Results  Component Value Date   HGBA1C 5.2 04/27/2017   Last GFR: Lab Results  Component Value Date   GFRNONAA 73 04/27/2017   Patient is on Vitamin D supplement and at goal at recent check:    Lab Results  Component Value Date   VD25OH 64 04/27/2017      Medication Review: Current Outpatient Medications on File Prior to Visit  Medication Sig Dispense Refill  . aspirin EC 81 MG tablet Take 81 mg by mouth daily.    . fexofenadine (ALLEGRA) 60 MG tablet Take 60 mg by mouth 2 (two) times daily.    . fluticasone (FLONASE) 50 MCG/ACT nasal spray Place 2 sprays into both nostrils daily. 16 g 0  . gabapentin (NEURONTIN) 100 MG capsule TAKE (1) CAPSULE THREE TIMES DAILY. 90 capsule 0  . valACYclovir (VALTREX) 1000 MG tablet Take 1 tablet (1,000 mg total) 3 (three) times daily by mouth. 21 tablet 3   No current facility-administered medications on file prior to visit.     Allergies  Allergen Reactions  . Penicillamine Hives  . Penicillins     REACTION: Hives  . Sulfamethoxazole Hives  . Sulfonamide Derivatives     REACTION: Hives    Current Problems (verified) Patient Active Problem List   Diagnosis Date Noted  . Aortic stenosis, moderate 09/09/2016  . Hyperlipidemia 01/16/2015  . Abnormal ECG 01/23/2014  . Female cystocele 02/09/2011  . Vitamin D deficient osteomalacia 10/28/2010  . Essential hypertension 12/16/2009  . Allergic rhinitis 12/16/2009    Screening Tests Immunization History  Administered Date(s) Administered  . Influenza Split 02/26/2011, 03/11/2012  . Influenza, High Dose Seasonal PF 01/29/2016  . Influenza,inj,Quad PF,6+ Mos 05/02/2013  . Pneumococcal Polysaccharide-23 01/16/2015  . Tdap 10/28/2010    Preventative care: Last colonoscopy: 2013 Last mammogram: 10/2016 Last pap smear/pelvic exam: 2010, declines further, hx  hysterectomy   DEXA: remote - will order  Prior vaccinations: TD or Tdap: 2012  Influenza: 2017 Pneumococcal: 2016 Prevnar13: DUE  Shingles/Zostavax: n/a in office  Names of Other Physician/Practitioners you currently use: 1. [facility] here for primary care 2. Dr. [place], eye doctor, last visit 07/2017 3. Dr. Marland Kitchen , dentist, last visit 2018 due for follow up   Patient Care Team: Unk Pinto, MD as PCP - General (Internal Medicine) [place], [place], [facility])  SURGICAL HISTORY She  has a past surgical history that includes Abdominal hysterectomy and Breast lumpectomy (Right, 2000). FAMILY HISTORY Her family history includes Arthritis in her other; Breast cancer in her other; Breast cancer (age of onset: 14) in her daughter; Breast cancer (age of onset: 11) in her daughter; Breast cancer (age of onset: 67) in her mother; Hyperlipidemia in her other; Lung cancer in her mother and other; Other in her father; Prostate cancer in her other. SOCIAL HISTORY She  reports that she has never smoked. She has never used smokeless tobacco. She reports that she does not drink alcohol or use drugs.  MEDICARE WELLNESS OBJECTIVES: Physical activity: Current Exercise Habits: Home exercise routine, Type of exercise: walking, Time (Minutes): 45, Frequency (Times/Week): 4, Weekly Exercise (Minutes/Week): 180, Intensity: Mild, Exercise limited by: None identified Cardiac risk factors: Cardiac Risk Factors include: advanced age (>33men, >46 women);dyslipidemia Depression/mood screen:   Depression screen [facility] 2/9 08/05/2017  Decreased Interest 0  Down, Depressed, Hopeless 0  PHQ - 2 Score 0    ADLs:  In your present state of health, do you have any difficulty performing the following activities: 08/05/2017 04/27/2017  Hearing? N N  Vision? N N  Difficulty concentrating or making decisions? N N  Walking or climbing stairs? N N  Dressing or bathing? N N  Doing  errands, shopping? N N  Some recent data might be hidden     Cognitive Testing  Alert? Yes  Normal Appearance?Yes  Oriented to person? Yes  Place? Yes   Time? Yes  Recall of three objects?  Yes  Can perform simple calculations? Yes  Displays appropriate judgment?Yes  Can read the correct time from a watch face?Yes  EOL planning: Does Patient Have a Medical Advance Directive?: Yes Type of Advance Directive: [place] Does patient want to make changes to medical advance directive?: No - Patient declined Copy of [place] in Chart?: No - copy requested  Review of Systems  Constitutional: Negative for malaise/fatigue and weight loss.  HENT: Negative for hearing loss and tinnitus.   Eyes: Negative for blurred vision and double vision.  Respiratory: Negative for cough, sputum production, shortness of breath and wheezing.   Cardiovascular: Negative for chest pain, palpitations, orthopnea, claudication, leg swelling and PND.  Gastrointestinal: Negative for abdominal pain, blood in stool, constipation, diarrhea, heartburn, melena, nausea and vomiting.  Genitourinary: Negative.   Musculoskeletal: Negative for falls, joint pain and myalgias.  Skin: Negative for rash.  Neurological: Negative for dizziness, tingling, sensory change, weakness and headaches.  Endo/Heme/Allergies: Negative for polydipsia.  Psychiatric/Behavioral: Negative.  Negative for depression, memory loss, substance abuse and suicidal ideas. The patient is not nervous/anxious and does not have insomnia.   All other systems reviewed and are negative.    Objective:     Today's Vitals   08/05/17 0850  BP: 130/88  Pulse: 77  Temp: (!) 97.3 F (36.3 C)  SpO2: 98%  Weight: 141 lb (64 kg)  Height: 5' 5.5" (1.664 m)   Body mass index is 23.11 kg/m.  General appearance: alert, no distress, WD/WN, female HEENT: normocephalic, sclerae anicteric, TMs pearly, nares patent, no discharge  or erythema, pharynx normal Oral cavity: MMM, no lesions Neck: supple, no lymphadenopathy, no thyromegaly, no masses Heart: RRR, normal S1, S2, no murmurs Lungs: CTA bilaterally, no wheezes, rhonchi, or rales Abdomen: +bs, soft, non tender, non distended, no masses, no hepatomegaly, no splenomegaly Musculoskeletal: nontender, no swelling, no obvious deformity Extremities: no edema, no cyanosis, no clubbing Pulses: 2+ symmetric, upper and lower extremities, normal cap refill Neurological: alert, oriented x 3, CN2-12 intact, strength normal upper extremities and lower extremities, sensation normal throughout, DTRs 2+ throughout, no cerebellar signs, gait normal Psychiatric: normal affect, behavior normal, pleasant   Medicare Attestation I have personally reviewed: The patient's medical and social history Their use of alcohol, tobacco or illicit drugs Their current medications and supplements The patient's functional ability including ADLs,fall risks, home safety risks, cognitive, and hearing and visual impairment Diet and physical activities Evidence for depression or mood disorders  The patient's weight, height, BMI, and visual acuity have  been recorded in the chart.  I have made referrals, counseling, and provided education to the patient based on review of the above and I have provided the patient with a written personalized care plan for preventive services.     Izora Ribas, NP   08/05/2017

## 2017-08-05 ENCOUNTER — Encounter: Payer: Self-pay | Admitting: Adult Health

## 2017-08-05 ENCOUNTER — Ambulatory Visit (INDEPENDENT_AMBULATORY_CARE_PROVIDER_SITE_OTHER): Payer: PPO | Admitting: Adult Health

## 2017-08-05 VITALS — BP 130/88 | HR 77 | Temp 97.3°F | Ht 65.5 in | Wt 141.0 lb

## 2017-08-05 DIAGNOSIS — Z0000 Encounter for general adult medical examination without abnormal findings: Secondary | ICD-10-CM

## 2017-08-05 DIAGNOSIS — Z0001 Encounter for general adult medical examination with abnormal findings: Secondary | ICD-10-CM | POA: Diagnosis not present

## 2017-08-05 DIAGNOSIS — F418 Other specified anxiety disorders: Secondary | ICD-10-CM | POA: Diagnosis not present

## 2017-08-05 DIAGNOSIS — E782 Mixed hyperlipidemia: Secondary | ICD-10-CM

## 2017-08-05 DIAGNOSIS — I350 Nonrheumatic aortic (valve) stenosis: Secondary | ICD-10-CM | POA: Diagnosis not present

## 2017-08-05 DIAGNOSIS — Z79899 Other long term (current) drug therapy: Secondary | ICD-10-CM | POA: Diagnosis not present

## 2017-08-05 DIAGNOSIS — Z6822 Body mass index (BMI) 22.0-22.9, adult: Secondary | ICD-10-CM | POA: Diagnosis not present

## 2017-08-05 DIAGNOSIS — J302 Other seasonal allergic rhinitis: Secondary | ICD-10-CM | POA: Diagnosis not present

## 2017-08-05 DIAGNOSIS — N8110 Cystocele, unspecified: Secondary | ICD-10-CM | POA: Diagnosis not present

## 2017-08-05 DIAGNOSIS — R6889 Other general symptoms and signs: Secondary | ICD-10-CM

## 2017-08-05 DIAGNOSIS — M839 Adult osteomalacia, unspecified: Secondary | ICD-10-CM

## 2017-08-05 DIAGNOSIS — E2839 Other primary ovarian failure: Secondary | ICD-10-CM

## 2017-08-05 DIAGNOSIS — Z23 Encounter for immunization: Secondary | ICD-10-CM | POA: Diagnosis not present

## 2017-08-05 DIAGNOSIS — I10 Essential (primary) hypertension: Secondary | ICD-10-CM

## 2017-08-05 LAB — CBC WITH DIFFERENTIAL/PLATELET
Basophils Absolute: 42 {cells}/uL (ref 0–200)
Basophils Relative: 0.8 %
Eosinophils Absolute: 69 {cells}/uL (ref 15–500)
Eosinophils Relative: 1.3 %
HCT: 38.8 % (ref 35.0–45.0)
Hemoglobin: 13.1 g/dL (ref 11.7–15.5)
Lymphs Abs: 2343 {cells}/uL (ref 850–3900)
MCH: 31.4 pg (ref 27.0–33.0)
MCHC: 33.8 g/dL (ref 32.0–36.0)
MCV: 93 fL (ref 80.0–100.0)
MPV: 9.7 fL (ref 7.5–12.5)
Monocytes Relative: 8.9 %
Neutro Abs: 2374 {cells}/uL (ref 1500–7800)
Neutrophils Relative %: 44.8 %
Platelets: 216 Thousand/uL (ref 140–400)
RBC: 4.17 Million/uL (ref 3.80–5.10)
RDW: 12.3 % (ref 11.0–15.0)
Total Lymphocyte: 44.2 %
WBC mixed population: 472 {cells}/uL (ref 200–950)
WBC: 5.3 Thousand/uL (ref 3.8–10.8)

## 2017-08-05 LAB — COMPLETE METABOLIC PANEL WITHOUT GFR
AG Ratio: 2.8 (calc) — ABNORMAL HIGH (ref 1.0–2.5)
ALT: 13 U/L (ref 6–29)
AST: 20 U/L (ref 10–35)
Albumin: 4.5 g/dL (ref 3.6–5.1)
Alkaline phosphatase (APISO): 88 U/L (ref 33–130)
BUN: 18 mg/dL (ref 7–25)
CO2: 29 mmol/L (ref 20–32)
Calcium: 9.8 mg/dL (ref 8.6–10.4)
Chloride: 104 mmol/L (ref 98–110)
Creat: 0.76 mg/dL (ref 0.50–0.99)
GFR, Est African American: 93 mL/min/1.73m2 (ref >=60)
GFR, Est Non African American: 81 mL/min/1.73m2 (ref >=60)
Globulin: 1.6 g/dL — ABNORMAL LOW (ref 1.9–3.7)
Glucose, Bld: 85 mg/dL (ref 65–99)
Potassium: 4.2 mmol/L (ref 3.5–5.3)
Sodium: 142 mmol/L (ref 135–146)
Total Bilirubin: 0.4 mg/dL (ref 0.2–1.2)
Total Protein: 6.1 g/dL (ref 6.1–8.1)

## 2017-08-05 LAB — LIPID PANEL
Cholesterol: 258 mg/dL — ABNORMAL HIGH (ref <200)
HDL: 114 mg/dL (ref >50)
LDL Cholesterol (Calc): 127 mg/dL — ABNORMAL HIGH
Non-HDL Cholesterol (Calc): 144 mg/dL — ABNORMAL HIGH (ref <130)
Total CHOL/HDL Ratio: 2.3 (calc) (ref <5.0)
Triglycerides: 71 mg/dL (ref <150)

## 2017-08-05 LAB — TSH: TSH: 3.94 m[IU]/L (ref 0.40–4.50)

## 2017-08-05 MED ORDER — BUPROPION HCL ER (XL) 150 MG PO TB24: 150.0000 mg | ORAL_TABLET | Freq: Every day | ORAL | 1 refills | Status: DC

## 2017-08-05 MED ORDER — ALPRAZOLAM 0.5 MG PO TABS
ORAL_TABLET | ORAL | 0 refills | Status: DC
Start: 2017-08-05 — End: 2018-02-15

## 2017-08-05 NOTE — Patient Instructions (Addendum)
[employer] with no obligation # [phone] Do not have to be a member Tues-Sat 10-6  [place]- free test with no obligation # [phone] MUST BE A MEMBER Call for store hours  Have had patient's get good cheaper hearing aids from [employer] The air version has good reviews.    Aim for 7+ servings of fruits and vegetables daily  80+ fluid ounces of water or unsweet tea for healthy kidneys Limit alcohol intake  Limit animal fats in diet for cholesterol and heart health - choose grass fed whenever available  Aim for low stress - take time to unwind and care for your mental health  Aim for 150 min of moderate intensity exercise weekly for heart health, and weights twice weekly for bone health  Aim for 7-9 hours of sleep daily

## 2017-09-07 NOTE — Progress Notes (Signed)
Assessment and Plan:  Onychomycosis - has tried OTC meds, willing to try Lamisil, side effects discussed, will follow up in 8 weeks for LFTs, cheapest at [employer] -     terbinafine (LAMISIL) 250 MG tablet; Take 1 tablet (250 mg total) by mouth daily. Take one daily for 3 months, need liver function lab at 6 weeks.  Further disposition pending results of labs. Discussed med's effects and SE's.   Over 15 minutes of exam, counseling, chart review, and critical decision making was performed.   Future Appointments  Date Time Provider [place]  11/08/2017  9:30 AM Unk Pinto, MD [facility] None  02/22/2018  9:30 AM [facility]  05/16/2018  9:00 AM Unk Pinto, MD [facility] None  08/16/2018  9:00 AM Liane Comber, NP [facility] None    ------------------------------------------------------------------------------------------------------------------  HPI BP 124/82   Pulse 71   Temp 97.9 F (36.6 C)   Ht 5' 5.5" (1.664 m)   Wt 139 lb (63 kg)   SpO2 97%   BMI 22.78 kg/m   67 y.o.female presents for evaluation of her toe nails; she reports she went to a new pedicure place 03/2017 and since has had thickening and yellowing of bilateral great toes. Onycholysis of edges also notable. She has been soaking in vinegar without much improvement, as well as an OTC nail fungus cream for the past 2 months.   Past Medical History:  Diagnosis Date  . Allergy    rhinitis  . Aortic stenosis, moderate    on echo in 2015.  Marland Kitchen Cancer ([place])    breast  . Hyperlipidemia   . Hypertension   . Personal history of chemotherapy 2000  . Personal history of radiation therapy 2000     Allergies  Allergen Reactions  . Penicillamine Hives  . Penicillins     REACTION: Hives  . Sulfamethoxazole Hives  . Sulfonamide Derivatives     REACTION: Hives    Current Outpatient Medications on File Prior to Visit  Medication Sig  . aspirin EC 81  MG tablet Take 81 mg by mouth daily.  Marland Kitchen buPROPion (WELLBUTRIN XL) 150 MG 24 hr tablet Take 1 tablet (150 mg total) by mouth daily.  . fexofenadine (ALLEGRA) 60 MG tablet Take 60 mg by mouth 2 (two) times daily.  . fluticasone (FLONASE) 50 MCG/ACT nasal spray Place 2 sprays into both nostrils daily.  Marland Kitchen gabapentin (NEURONTIN) 100 MG capsule TAKE (1) CAPSULE THREE TIMES DAILY.  . valACYclovir (VALTREX) 1000 MG tablet Take 1 tablet (1,000 mg total) 3 (three) times daily by mouth.  . ALPRAZolam (XANAX) 0.5 MG tablet Take 1/2-1 tab up to twice a day as needed for anxiety or sleep; limit use to less than 5 days a week. (Patient not taking: Reported on 09/08/2017)   No current facility-administered medications on file prior to visit.     ROS: all negative except above.   Physical Exam:  BP 124/82   Pulse 71   Temp 97.9 F (36.6 C)   Ht 5' 5.5" (1.664 m)   Wt 139 lb (63 kg)   SpO2 97%   BMI 22.78 kg/m   General Appearance: Well nourished, in no apparent distress. Neck: Supple.  Respiratory: Respiratory effort normal Cardio: RRR with no MRGs. Brisk peripheral pulses without edema.  Lymphatics: Non tender without lymphadenopathy.  Musculoskeletal: normal gait.  Skin: Warm, dry without rashes, lesions, ecchymosis. Bilateral great toe nails somewhat thickened, yellowed, with onycholysis around borders.  Psych: Awake  and oriented X 3, normal affect, Insight and Judgment appropriate.     Izora Ribas, NP 4:28 PM [facility]

## 2017-09-08 ENCOUNTER — Encounter: Payer: Self-pay | Admitting: Adult Health

## 2017-09-08 ENCOUNTER — Ambulatory Visit (INDEPENDENT_AMBULATORY_CARE_PROVIDER_SITE_OTHER): Payer: PPO | Admitting: Adult Health

## 2017-09-08 VITALS — BP 124/82 | HR 71 | Temp 97.9°F | Ht 65.5 in | Wt 139.0 lb

## 2017-09-08 DIAGNOSIS — B351 Tinea unguium: Secondary | ICD-10-CM

## 2017-09-08 MED ORDER — TERBINAFINE HCL 250 MG PO TABS: 250.0000 mg | ORAL_TABLET | Freq: Every day | ORAL | 0 refills | Status: AC

## 2017-09-08 NOTE — Patient Instructions (Signed)
Fungal Nail Infection Fungal nail infection is a common fungal infection of the toenails or fingernails. This condition affects toenails more often than fingernails. More than one nail may be infected. The condition can be passed from person to person (is contagious). What are the causes? This condition is caused by a fungus. Several types of funguses can cause the infection. These funguses are common in moist and warm areas. If your hands or feet come into contact with the fungus, it may get into a crack in your fingernail or toenail and cause the infection. What increases the risk? The following factors may make you more likely to develop this condition:  Being female.  Having diabetes.  Being of older age.  Living with someone who has the fungus.  Walking barefoot in areas where the fungus thrives, such as showers or locker rooms.  Having poor circulation.  Wearing shoes and socks that cause your feet to sweat.  Having athlete's foot.  Having a nail injury or history of a recent nail surgery.  Having psoriasis.  Having a weak body defense system (immune system).  What are the signs or symptoms? Symptoms of this condition include:  A pale spot on the nail.  Thickening of the nail.  A nail that becomes yellow or brown.  A brittle or ragged nail edge.  A crumbling nail.  A nail that has lifted away from the nail bed.  How is this diagnosed? This condition is diagnosed with a physical exam. Your health care provider may take a scraping or clipping from your nail to test for the fungus. How is this treated? Mild infections do not need treatment. If you have significant nail changes, treatment may include:  Oral antifungal medicines. You may need to take the medicine for several weeks or several months, and you may not see the results for a long time. These medicines can cause side effects. Ask your health care provider what problems to watch for.  Antifungal nail polish  and nail cream. These may be used along with oral antifungal medicines.  Laser treatment of the nail.  Surgery to remove the nail. This may be needed for the most severe infections.  Treatment takes a long time, and the infection may come back. Follow these instructions at home: Medicines  Take or apply over-the-counter and prescription medicines only as told by your health care provider.  Ask your health care provider about using over-the-counter mentholated ointment on your nails. Lifestyle   Do not share personal items, such as towels or nail clippers.  Trim your nails often.  Wash and dry your hands and feet every day.  Wear absorbent socks, and change your socks frequently.  Wear shoes that allow air to circulate, such as sandals or canvas tennis shoes. Throw out old shoes.  Wear rubber gloves if you are working with your hands in wet areas.  Do not walk barefoot in shower rooms or locker rooms.  Do not use a nail salon that does not use clean instruments.  Do not use artificial nails. General instructions  Keep all follow-up visits as told by your health care provider. This is important.  Use antifungal foot powder on your feet and in your shoes. Contact a health care provider if: Your infection is not getting better or it is getting worse after several months. This information is not intended to replace advice given to you by your health care provider. Make sure you discuss any questions you have with your health   care provider. Document Released: 04/03/2000 Document Revised: 09/12/2015 Document Reviewed: 10/08/2014 Elsevier Interactive Patient Education  2018 [employer].    Terbinafine tablets What is this medicine? TERBINAFINE (TER bin a feen) is an antifungal medicine. It is used to treat certain kinds of fungal or yeast infections. This medicine may be used for other purposes; ask your health care provider or pharmacist if you have questions. COMMON  BRAND NAME(S): Lamisil, Terbinex What should I tell my health care provider before I take this medicine? They need to know if you have any of these conditions: -drink alcoholic beverages -kidney disease -liver disease -an unusual or allergic reaction to terbinafine, other medicines, foods, dyes, or preservatives -pregnant or trying to get pregnant -breast-feeding How should I use this medicine? Take this medicine by mouth with a full glass of water. Follow the directions on the prescription label. You can take this medicine with food or on an empty stomach. Take your medicine at regular intervals. Do not take your medicine more often than directed. Do not skip doses or stop your medicine early even if you feel better. Do not stop taking except on your doctor's advice. Talk to your pediatrician regarding the use of this medicine in children. Special care may be needed. Overdosage: If you think you have taken too much of this medicine contact a poison control center or emergency room at once. NOTE: This medicine is only for you. Do not share this medicine with others. What if I miss a dose? If you miss a dose, take it as soon as you can. If it is almost time for your next dose, take only that dose. Do not take double or extra doses. What may interact with this medicine? Do not take this medicine with any of the following medications: -thioridazine This medicine may also interact with the following medications: -beta-blockers -caffeine -cimetidine -cyclosporine -medicines for depression, anxiety, or psychotic disturbances -medicines for fungal infections like fluconazole and ketoconazole -medicines for irregular heartbeat like amiodarone, flecainide and propafenone -rifampin -warfarin This list may not describe all possible interactions. Give your health care provider a list of all the medicines, herbs, non-prescription drugs, or dietary supplements you use. Also tell them if you smoke,  drink alcohol, or use illegal drugs. Some items may interact with your medicine. What should I watch for while using this medicine? Visit your doctor or health care provider regularly. Tell your doctor right away if you have nausea or vomiting, loss of appetite, stomach pain on your right upper side, yellow skin, dark urine, light stools, or are over tired. Some fungal infections need many weeks or months of treatment to cure. If you are taking this medicine for a long time, you will need to have important blood work done. What side effects may I notice from receiving this medicine? Side effects that you should report to your doctor or health care professional as soon as possible: -allergic reactions like skin rash or hives, swelling of the face, lips, or tongue -changes in vision -dark urine -fever or infection -general ill feeling or flu-like symptoms -light-colored stools -loss of appetite, nausea -redness, blistering, peeling or loosening of the skin, including inside the mouth -right upper belly pain -unusually weak or tired -yellowing of the eyes or skin Side effects that usually do not require medical attention (report to your doctor or health care professional if they continue or are bothersome): -changes in taste -diarrhea -hair loss -muscle or joint pain -stomach gas -stomach upset This list may  not describe all possible side effects. Call your doctor for medical advice about side effects. You may report side effects to FDA at 1-800-FDA-1088. Where should I keep my medicine? Keep out of the reach of children. Store at room temperature below 25 degrees C (77 degrees F). Protect from light. Throw away any unused medicine after the expiration date. NOTE: This sheet is a summary. It may not cover all possible information. If you have questions about this medicine, talk to your doctor, pharmacist, or health care provider.  2018 Elsevier/Gold Standard (2007-06-17 16:28:07)

## 2017-09-13 ENCOUNTER — Other Ambulatory Visit: Payer: Self-pay | Admitting: Adult Health

## 2017-09-22 ENCOUNTER — Other Ambulatory Visit: Payer: Self-pay | Admitting: Adult Health

## 2017-09-22 DIAGNOSIS — Z1231 Encounter for screening mammogram for malignant neoplasm of breast: Secondary | ICD-10-CM

## 2017-10-12 ENCOUNTER — Other Ambulatory Visit: Payer: Self-pay | Admitting: Internal Medicine

## 2017-10-12 MED ORDER — FLUCONAZOLE 150 MG PO TABS: ORAL_TABLET | ORAL | 1 refills | Status: DC

## 2017-11-04 ENCOUNTER — Ambulatory Visit
Admission: RE | Admit: 2017-11-04 | Discharge: 2017-11-04 | Disposition: A | Discharge status: Home / Self Care | Payer: PPO | Source: Ambulatory Visit | Attending: Adult Health | Admitting: Adult Health

## 2017-11-04 ENCOUNTER — Encounter: Payer: Self-pay | Admitting: Adult Health

## 2017-11-04 DIAGNOSIS — M8580 Other specified disorders of bone density and structure, unspecified site: Secondary | ICD-10-CM | POA: Insufficient documentation

## 2017-11-04 DIAGNOSIS — Z1231 Encounter for screening mammogram for malignant neoplasm of breast: Secondary | ICD-10-CM | POA: Diagnosis not present

## 2017-11-04 DIAGNOSIS — E2839 Other primary ovarian failure: Secondary | ICD-10-CM

## 2017-11-04 DIAGNOSIS — Z780 Asymptomatic menopausal state: Secondary | ICD-10-CM | POA: Diagnosis not present

## 2017-11-04 DIAGNOSIS — M85852 Other specified disorders of bone density and structure, left thigh: Secondary | ICD-10-CM | POA: Diagnosis not present

## 2017-11-08 ENCOUNTER — Encounter: Payer: Self-pay | Admitting: Internal Medicine

## 2017-11-08 ENCOUNTER — Ambulatory Visit (INDEPENDENT_AMBULATORY_CARE_PROVIDER_SITE_OTHER): Payer: PPO | Admitting: Internal Medicine

## 2017-11-08 VITALS — BP 122/70 | HR 68 | Temp 97.3°F | Resp 16 | Ht 65.5 in | Wt 136.6 lb

## 2017-11-08 DIAGNOSIS — R7309 Other abnormal glucose: Secondary | ICD-10-CM | POA: Diagnosis not present

## 2017-11-08 DIAGNOSIS — Z79899 Other long term (current) drug therapy: Secondary | ICD-10-CM

## 2017-11-08 DIAGNOSIS — R0989 Other specified symptoms and signs involving the circulatory and respiratory systems: Secondary | ICD-10-CM | POA: Diagnosis not present

## 2017-11-08 DIAGNOSIS — E782 Mixed hyperlipidemia: Secondary | ICD-10-CM

## 2017-11-08 DIAGNOSIS — E559 Vitamin D deficiency, unspecified: Secondary | ICD-10-CM | POA: Diagnosis not present

## 2017-11-08 NOTE — Patient Instructions (Signed)

## 2017-11-08 NOTE — Progress Notes (Addendum)
This very nice 67 y.o. MWF  presents for 6 month follow up with HTN, HLD, Pre-Diabetes and Vitamin D Deficiency. Other problems are hx/o Rt lumpectomy (2000) w/ChemoRadiation completing 5 yrs Arimidex. Also hx/o Shingles of the Lt eye (2014) treating w/Gabapentin.      Patient is followed expectantly for labile HTN & BP has been controlled at home. Today's BP is at goal - 122/70. Patient has had no complaints of any cardiac type chest pain, palpitations, dyspnea / orthopnea / PND, dizziness, claudication, or dependent edema. Dr Johnsie Cancel follows her for moderate AoS.      Hyperlipidemia is controlled with diet. Last Lipids were not at goal: Lab Results  Component Value Date   CHOL 258 (H) 08/05/2017   HDL 114 08/05/2017   LDLCALC 127 (H) 08/05/2017   LDLDIRECT 73.3 04/30/2011   TRIG 71 08/05/2017   CHOLHDL 2.3 08/05/2017      Also, the patient is monitored expectantly for PreDiabetes and has had no symptoms of reactive hypoglycemia, diabetic polys, paresthesias or visual blurring.  Last A1c was Normal & at goal: Lab Results  Component Value Date   HGBA1C 5.2 04/27/2017      Further, the patient also has history of Vitamin D Deficiency ("25"/2016) and supplements vitamin D without any suspected side-effects. Last vitamin D was at goal: Lab Results  Component Value Date   VD25OH 64 04/27/2017   Current Outpatient Medications on File Prior to Visit  Medication Sig  . ALPRAZolam (XANAX) 0.5 MG tablet Take 1/2-1 tab up to twice a day as needed for anxiety or sleep; limit use to less than 5 days a week.  Marland Kitchen aspirin EC 81 MG tablet Take 81 mg by mouth daily.  Marland Kitchen buPROPion (WELLBUTRIN XL) 150 MG 24 hr tablet Take 1 tablet (150 mg total) by mouth daily.  . fexofenadine (ALLEGRA) 60 MG tablet Take 60 mg by mouth 2 (two) times daily.  . fluticasone (FLONASE) 50 MCG/ACT nasal spray Place 2 sprays into both nostrils daily.  Marland Kitchen gabapentin (NEURONTIN) 100 MG capsule TAKE (1) CAPSULE THREE TIMES  DAILY.  Marland Kitchen terbinafine (LAMISIL) 250 MG tablet Take 1 tablet (250 mg total) by mouth daily. Take one daily for 3 months, need liver function lab at 6 weeks.  . valACYclovir (VALTREX) 1000 MG tablet Take 1 tablet (1,000 mg total) 3 (three) times daily by mouth.   No current facility-administered medications on file prior to visit.    Allergies  Allergen Reactions  . Penicillamine Hives  . Penicillins     REACTION: Hives  . Sulfamethoxazole Hives  . Sulfonamide Derivatives     REACTION: Hives   PMHx:   Past Medical History:  Diagnosis Date  . Allergy    rhinitis  . Aortic stenosis, moderate    on echo in 2015.  Marland Kitchen Cancer ([place])    breast  . Hyperlipidemia   . Hypertension   . Personal history of chemotherapy 2000  . Personal history of radiation therapy 2000   Immunization History  Administered Date(s) Administered  . Influenza Split 02/26/2011, 03/11/2012  . Influenza, High Dose Seasonal PF 01/29/2016  . Influenza,inj,Quad PF,6+ Mos 05/02/2013  . Pneumococcal Conjugate-13 08/05/2017  . Pneumococcal Polysaccharide-23 01/16/2015  . Tdap 10/28/2010   Past Surgical History:  Procedure Laterality Date  . ABDOMINAL HYSTERECTOMY    . BREAST LUMPECTOMY Right 2000   malignant  Last Colonoscopy  03/14/2012 = Dr Watt Climes - recc 10 yr f/u  Last MGM -  11/04/2017  FHx:    Reviewed / unchanged  SHx:    Reviewed / unchanged   Systems Review:  Constitutional: Denies fever, chills, wt changes, headaches, insomnia, fatigue, night sweats, change in appetite. Eyes: Denies redness, blurred vision, diplopia, discharge, itchy, watery eyes.  ENT: Denies discharge, congestion, post nasal drip, epistaxis, sore throat, earache, hearing loss, dental pain, tinnitus, vertigo, sinus pain, snoring.  CV: Denies chest pain, palpitations, irregular heartbeat, syncope, dyspnea, diaphoresis, orthopnea, PND, claudication or edema. Respiratory: denies cough, dyspnea, DOE, pleurisy, hoarseness, laryngitis,  wheezing.  Gastrointestinal: Denies dysphagia, odynophagia, heartburn, reflux, water brash, abdominal pain or cramps, nausea, vomiting, bloating, diarrhea, constipation, hematemesis, melena, hematochezia  or hemorrhoids. Genitourinary: Denies dysuria, frequency, urgency, nocturia, hesitancy, discharge, hematuria or flank pain. Musculoskeletal: Denies arthralgias, myalgias, stiffness, jt. swelling, pain, limping or strain/sprain.  Skin: Denies pruritus, rash, hives, warts, acne, eczema or change in skin lesion(s). Neuro: No weakness, tremor, incoordination, spasms, paresthesia or pain. Psychiatric: Denies confusion, memory loss or sensory loss. Endo: Denies change in weight, skin or hair change.  Heme/Lymph: No excessive bleeding, bruising or enlarged lymph nodes.  Physical Exam  BP 122/70   Pulse 68   Temp (!) 97.3 F (36.3 C)   Resp 16   Ht 5' 5.5" (1.664 m)   Wt 136 lb 9.6 oz (62 kg)   SpO2 96%   BMI 22.39 kg/m   Appears  well nourished, well groomed  and in no distress.  Eyes: PERRLA, EOMs, conjunctiva no swelling or erythema. Sinuses: No frontal/maxillary tenderness ENT/Mouth: EAC's clear, TM's nl w/o erythema, bulging. Nares clear w/o erythema, swelling, exudates. Oropharynx clear without erythema or exudates. Oral hygiene is good. Tongue normal, non obstructing. Hearing intact.  Neck: Supple. Thyroid not palpable. Car 2+/2+ without bruits, nodes or JVD. Chest: Respirations nl with BS clear & equal w/o rales, rhonchi, wheezing or stridor.  Cor: Heart sounds normal w/ regular rate and rhythm without sig. murmurs, gallops, clicks or rubs. Peripheral pulses normal and equal  without edema.  Abdomen: Soft & bowel sounds normal. Non-tender w/o guarding, rebound, hernias, masses or organomegaly.  Lymphatics: Unremarkable.  Musculoskeletal: Full ROM all peripheral extremities, joint stability, 5/5 strength and normal gait.  Skin: Warm, dry without exposed rashes, lesions or ecchymosis  apparent.  Neuro: Cranial nerves intact, reflexes equal bilaterally. Sensory-motor testing grossly intact. Tendon reflexes grossly intact.  Pysch: Alert & oriented x 3.  Insight and judgement nl & appropriate. No ideations.  Assessment and Plan:  1. Labile hypertension  - Continue medication, monitor blood pressure at home.  - Continue DASH diet.  Reminder to go to the ER if any CP,  SOB, nausea, dizziness, severe HA, changes vision/speech.  - CBC with Differential/Platelet - COMPLETE METABOLIC PANEL WITH GFR - Magnesium - TSH  2. Hyperlipidemia, mixed  - Continue diet/meds, exercise,& lifestyle modifications.  - Continue monitor periodic cholesterol/liver & renal functions   - Lipid panel - TSH  3. Vitamin D deficiency  - Continue diet, exercise, lifestyle modifications.  - Monitor appropriate labs.  - Hemoglobin A1c - Insulin, random  4. Abnormal glucose  - Continue supplementation.   - VITAMIN D 25 Hydroxyl  5. Medication management  - CBC with Differential/Platelet - COMPLETE METABOLIC PANEL WITH GFR - Magnesium - Lipid panel - TSH - Hemoglobin A1c - Insulin, random - VITAMIN D 25 Hydroxyl        Discussed  regular exercise, BP monitoring, weight control to achieve/maintain BMI less than 25 and discussed med and  SE's. Recommended labs to assess and monitor clinical status with further disposition pending results of labs. Over 30 minutes of exam, counseling, chart review was performed.

## 2017-11-09 ENCOUNTER — Other Ambulatory Visit: Payer: Self-pay | Admitting: Internal Medicine

## 2017-11-09 DIAGNOSIS — E782 Mixed hyperlipidemia: Secondary | ICD-10-CM

## 2017-11-09 LAB — COMPLETE METABOLIC PANEL WITH GFR
AG Ratio: 3 (calc) — ABNORMAL HIGH (ref 1.0–2.5)
ALT: 24 U/L (ref 6–29)
AST: 25 U/L (ref 10–35)
Albumin: 4.5 g/dL (ref 3.6–5.1)
Alkaline phosphatase (APISO): 90 U/L (ref 33–130)
BUN: 14 mg/dL (ref 7–25)
CALCIUM: 9.5 mg/dL (ref 8.6–10.4)
CO2: 30 mmol/L (ref 20–32)
CREATININE: 0.74 mg/dL (ref 0.50–0.99)
Chloride: 102 mmol/L (ref 98–110)
GFR, EST NON AFRICAN AMERICAN: 83 mL/min/{1.73_m2} (ref >=60)
GFR, Est African American: 96 mL/min/{1.73_m2} (ref >=60)
GLOBULIN: 1.5 g/dL — AB (ref 1.9–3.7)
GLUCOSE: 88 mg/dL (ref 65–99)
Potassium: 4.4 mmol/L (ref 3.5–5.3)
Sodium: 139 mmol/L (ref 135–146)
Total Bilirubin: 0.4 mg/dL (ref 0.2–1.2)
Total Protein: 6 g/dL — ABNORMAL LOW (ref 6.1–8.1)

## 2017-11-09 LAB — LIPID PANEL
CHOLESTEROL: 230 mg/dL — AB (ref <200)
HDL: 96 mg/dL (ref >50)
LDL CHOLESTEROL (CALC): 114 mg/dL — AB
Non-HDL Cholesterol (Calc): 134 mg/dL (calc) — ABNORMAL HIGH (ref <130)
TRIGLYCERIDES: 96 mg/dL (ref <150)
Total CHOL/HDL Ratio: 2.4 (calc) (ref <5.0)

## 2017-11-09 LAB — CBC WITH DIFFERENTIAL/PLATELET
Basophils Absolute: 60 cells/uL (ref 0–200)
Basophils Relative: 1.2 %
EOS PCT: 2.6 %
Eosinophils Absolute: 130 cells/uL (ref 15–500)
HEMATOCRIT: 37.1 % (ref 35.0–45.0)
Hemoglobin: 12.6 g/dL (ref 11.7–15.5)
Lymphs Abs: 1720 cells/uL (ref 850–3900)
MCH: 31.9 pg (ref 27.0–33.0)
MCHC: 34 g/dL (ref 32.0–36.0)
MCV: 93.9 fL (ref 80.0–100.0)
MPV: 9.9 fL (ref 7.5–12.5)
Monocytes Relative: 12.3 %
NEUTROS ABS: 2475 {cells}/uL (ref 1500–7800)
Neutrophils Relative %: 49.5 %
Platelets: 204 10*3/uL (ref 140–400)
RBC: 3.95 10*6/uL (ref 3.80–5.10)
RDW: 12 % (ref 11.0–15.0)
Total Lymphocyte: 34.4 %
WBC mixed population: 615 cells/uL (ref 200–950)
WBC: 5 10*3/uL (ref 3.8–10.8)

## 2017-11-09 LAB — MAGNESIUM: Magnesium: 1.9 mg/dL (ref 1.5–2.5)

## 2017-11-09 LAB — HEMOGLOBIN A1C
EAG (MMOL/L): 5.5 (calc)
Hgb A1c MFr Bld: 5.1 % of total Hgb (ref <5.7)
Mean Plasma Glucose: 100 (calc)

## 2017-11-09 LAB — INSULIN, RANDOM: Insulin: 1.9 u[IU]/mL — ABNORMAL LOW (ref 2.0–19.6)

## 2017-11-09 LAB — TSH: TSH: 3.97 mIU/L (ref 0.40–4.50)

## 2017-11-09 LAB — VITAMIN D 25 HYDROXY (VIT D DEFICIENCY, FRACTURES): Vit D, 25-Hydroxy: 91 ng/mL (ref 30–100)

## 2017-11-09 MED ORDER — ROSUVASTATIN CALCIUM 40 MG PO TABS: ORAL_TABLET | ORAL | 1 refills | Status: DC

## 2017-11-10 ENCOUNTER — Telehealth: Payer: Self-pay

## 2017-11-10 ENCOUNTER — Other Ambulatory Visit: Payer: Self-pay | Admitting: Internal Medicine

## 2017-11-10 DIAGNOSIS — E782 Mixed hyperlipidemia: Secondary | ICD-10-CM

## 2017-11-10 MED ORDER — EZETIMIBE 10 MG PO TABS: ORAL_TABLET | ORAL | 1 refills | Status: DC

## 2017-11-10 NOTE — Telephone Encounter (Signed)
PT aware of lab results. Pt reports she can not take Crestor due it causing her so much body pain. Pt states she spoke with her about this and you stated that you would try another new med that might help. Please advise?

## 2017-11-11 ENCOUNTER — Telehealth: Payer: Self-pay

## 2017-11-11 NOTE — Telephone Encounter (Signed)
LVM informing pt of new RX that was sent to pharmacy. July 25th 2019

## 2017-11-17 ENCOUNTER — Other Ambulatory Visit: Payer: Self-pay | Admitting: Internal Medicine

## 2017-11-22 ENCOUNTER — Telehealth: Payer: Self-pay | Admitting: *Deleted

## 2017-11-22 MED ORDER — ROSUVASTATIN CALCIUM 40 MG PO TABS: ORAL_TABLET | ORAL | Status: DC

## 2017-11-22 NOTE — Telephone Encounter (Signed)
Patient called about her Crestor 40 mg tablets.  She was reminded she should only take 1/2 tablet every other day or 1/4 tablet daily. Patient states the Zetia caused her a GI upset. The patient will restart Crestor.  Dr Melford Aase is aware.

## 2017-12-08 ENCOUNTER — Other Ambulatory Visit: Payer: Self-pay | Admitting: Adult Health

## 2017-12-08 DIAGNOSIS — F418 Other specified anxiety disorders: Secondary | ICD-10-CM

## 2017-12-14 ENCOUNTER — Other Ambulatory Visit: Payer: Self-pay | Admitting: Adult Health

## 2017-12-30 DIAGNOSIS — H16223 Keratoconjunctivitis sicca, not specified as Sjogren's, bilateral: Secondary | ICD-10-CM | POA: Diagnosis not present

## 2018-01-03 DIAGNOSIS — N815 Vaginal enterocele: Secondary | ICD-10-CM | POA: Diagnosis not present

## 2018-01-03 DIAGNOSIS — N952 Postmenopausal atrophic vaginitis: Secondary | ICD-10-CM | POA: Diagnosis not present

## 2018-01-05 ENCOUNTER — Other Ambulatory Visit: Payer: Self-pay | Admitting: Internal Medicine

## 2018-01-26 DIAGNOSIS — D0472 Carcinoma in situ of skin of left lower limb, including hip: Secondary | ICD-10-CM | POA: Diagnosis not present

## 2018-01-26 DIAGNOSIS — L570 Actinic keratosis: Secondary | ICD-10-CM | POA: Diagnosis not present

## 2018-01-26 DIAGNOSIS — Z85828 Personal history of other malignant neoplasm of skin: Secondary | ICD-10-CM | POA: Diagnosis not present

## 2018-01-26 DIAGNOSIS — D485 Neoplasm of uncertain behavior of skin: Secondary | ICD-10-CM | POA: Diagnosis not present

## 2018-01-26 DIAGNOSIS — L821 Other seborrheic keratosis: Secondary | ICD-10-CM | POA: Diagnosis not present

## 2018-01-26 DIAGNOSIS — C44722 Squamous cell carcinoma of skin of right lower limb, including hip: Secondary | ICD-10-CM | POA: Diagnosis not present

## 2018-02-09 ENCOUNTER — Ambulatory Visit: Payer: Self-pay | Admitting: Physician Assistant

## 2018-02-09 DIAGNOSIS — N8111 Cystocele, midline: Secondary | ICD-10-CM | POA: Diagnosis not present

## 2018-02-09 DIAGNOSIS — N819 Female genital prolapse, unspecified: Secondary | ICD-10-CM | POA: Diagnosis not present

## 2018-02-09 DIAGNOSIS — N952 Postmenopausal atrophic vaginitis: Secondary | ICD-10-CM | POA: Diagnosis not present

## 2018-02-15 ENCOUNTER — Ambulatory Visit: Payer: Self-pay | Admitting: Adult Health

## 2018-02-15 ENCOUNTER — Encounter: Payer: Self-pay | Admitting: Adult Health Nurse Practitioner

## 2018-02-15 ENCOUNTER — Ambulatory Visit (INDEPENDENT_AMBULATORY_CARE_PROVIDER_SITE_OTHER): Payer: PPO | Admitting: Adult Health Nurse Practitioner

## 2018-02-15 VITALS — BP 124/86 | HR 68 | Temp 97.8°F | Resp 16 | Ht 65.5 in | Wt 135.0 lb

## 2018-02-15 DIAGNOSIS — E782 Mixed hyperlipidemia: Secondary | ICD-10-CM | POA: Diagnosis not present

## 2018-02-15 DIAGNOSIS — R7309 Other abnormal glucose: Secondary | ICD-10-CM | POA: Diagnosis not present

## 2018-02-15 DIAGNOSIS — Z79899 Other long term (current) drug therapy: Secondary | ICD-10-CM

## 2018-02-15 DIAGNOSIS — M85852 Other specified disorders of bone density and structure, left thigh: Secondary | ICD-10-CM | POA: Diagnosis not present

## 2018-02-15 DIAGNOSIS — Z23 Encounter for immunization: Secondary | ICD-10-CM

## 2018-02-15 DIAGNOSIS — J302 Other seasonal allergic rhinitis: Secondary | ICD-10-CM | POA: Diagnosis not present

## 2018-02-15 DIAGNOSIS — M839 Adult osteomalacia, unspecified: Secondary | ICD-10-CM

## 2018-02-15 DIAGNOSIS — I10 Essential (primary) hypertension: Secondary | ICD-10-CM

## 2018-02-15 DIAGNOSIS — B351 Tinea unguium: Secondary | ICD-10-CM

## 2018-02-15 DIAGNOSIS — F418 Other specified anxiety disorders: Secondary | ICD-10-CM

## 2018-02-15 DIAGNOSIS — J069 Acute upper respiratory infection, unspecified: Secondary | ICD-10-CM | POA: Diagnosis not present

## 2018-02-15 DIAGNOSIS — N8110 Cystocele, unspecified: Secondary | ICD-10-CM

## 2018-02-15 NOTE — Progress Notes (Signed)
FOLLOW UP 3 Months  Assessment and Plan:   Kathryn Chavez was seen today for follow-up.  Diagnoses and all orders for this visit:  Essential hypertension No medication at this time - Continue DASH diet.  Reminder to go to the ER if any CP,  SOB, nausea, dizziness, severe HA, changes vision/speech.  Seasonal allergic rhinitis, unspecified trigger Doing well on current regiment Continue Fluticasone and Allegra PRN  Vitamin D deficient osteomalaci5,000units daily Taking 3 tums daily with Vit 5,000units daily  Not at goal last check, will check labs today   Osteopenia of neck of left femur See above Next DEXA 2022  Mixed hyperlipidemia Currently not at goal;  Continue low cholesterol diet and exercise.  Check lipid panel.   Prolapse vaginal wall  Following with Dr Zigmund Daniel and going to have surgery in January   Depression with anxiety Doing well Continue Wellbutrin XL 150mg  one tablet daily  Abnormal glucose Will check labs  Medication management  Onychomycosis of great toe Taking Lamsil 250mg  daily Reports improvement continue with benefit Will check labs today  Need for influenza vaccination: Declined  Reports vertigo symptoms from previous vaccination  Vitamin D Def At goal at last visit; continue supplementation to maintain goal of 70-100 Will check Vit D level  Continue diet and meds as discussed. Further disposition pending results of labs. Discussed med's effects and SE's.   Over 30 minutes of exam, counseling, chart review, and critical decision making was performed.   Vitals:   02/15/18 0844  BP: 124/86  Pulse: 68  Resp: 16  Temp: 97.8 F (36.6 C)  SpO2: 96%      Future Appointments  Date Time Provider [place]  02/22/2018  9:30 AM [facility] ECHO 3 [facility]  03/25/2018  8:00 AM Kathryn Hector, MD [facility]  06/07/2018  9:00 AM Unk Pinto, MD [facility] None  08/16/2018  9:00 AM Kathryn Comber, NP  [facility] None    ----------------------------------------------------------------------------------------------------------------------  HPI 67 y.o. female  presents for 3 month follow up on hypertension, cholesterol, pre-diabetes, anxiety, depression weight and vitamin D deficiency.  Repo and history of rts overall she is doing well.  She is not longer using her alprazolam for anxiety/sleep.  Reports she is getting a full nights rest.  Her depression is well controlled on current regiment.  Reports she is taking lamisil but has not been consistent with it over the past three weeks.  BMI is Body mass index is 22.12 kg/m., she has been working on diet and exercise.  She reports walking. Wt Readings from Last 3 Encounters:  02/15/18 135 lb (61.2 kg)  11/08/17 136 lb 9.6 oz (62 kg)  09/08/17 139 lb (63 kg)    Her blood pressure has been controlled at home, today their BP is BP: 124/86  She does workout. She denies chest pain, shortness of breath, dizziness.  She follow with Dr Johnsie Cancel for this.   She is on cholesterol medication Rosuvastatin and Fenofibrate and denies myalgias. Her cholesterol is not at goal. The cholesterol last visit was:   Lab Results  Component Value Date   CHOL 230 (H) 11/08/2017   HDL 96 11/08/2017   LDLCALC 114 (H) 11/08/2017   LDLDIRECT 73.3 04/30/2011   TRIG 96 11/08/2017   CHOLHDL 2.4 11/08/2017    She has been working on diet and exercise for prediabetes, and denies hyperglycemia. Last A1C in the office was:  Lab Results  Component Value Date   HGBA1C 5.1 11/08/2017  Patient is on Vitamin D supplement.  Last BMD 2019 with T score of -1.3 of left femur, osteopenia.  Next screening 2022.   Lab Results  Component Value Date   VD25OH 91 11/08/2017        Current Medications:  Current Outpatient Medications on File Prior to Visit  Medication Sig  . aspirin EC 81 MG tablet Take 81 mg by mouth daily.  Marland Kitchen buPROPion (WELLBUTRIN XL) 150 MG 24 hr tablet  TAKE 1 TABLET EACH DAY.  . fexofenadine (ALLEGRA) 60 MG tablet Take 60 mg by mouth 2 (two) times daily.  . fluticasone (FLONASE) 50 MCG/ACT nasal spray Place 2 sprays into both nostrils daily.  Marland Kitchen gabapentin (NEURONTIN) 100 MG capsule TAKE (1) CAPSULE THREE TIMES DAILY.  . rosuvastatin (CRESTOR) 40 MG tablet Take 1/2 to 1 tablet daily or as directed.  . terbinafine (LAMISIL) 250 MG tablet Take 1 tab daily for 1 month, then stop for 1 month. Repeat for 3 cycles.   No current facility-administered medications on file prior to visit.      Allergies:  Allergies  Allergen Reactions  . Penicillamine Hives  . Penicillins     REACTION: Hives  . Sulfamethoxazole Hives  . Sulfonamide Derivatives     REACTION: Hives     Medical History:  Past Medical History:  Diagnosis Date  . Allergy    rhinitis  . Aortic stenosis, moderate    on echo in 2015.  Marland Kitchen Cancer ([place])    breast  . Hyperlipidemia   . Hypertension   . Personal history of chemotherapy 2000  . Personal history of radiation therapy 2000   Family history- Reviewed and unchanged Social history- Reviewed and unchanged   Review of Systems:  Review of Systems  Constitutional: Negative for chills, diaphoresis, fever, malaise/fatigue and weight loss.  HENT: Negative for congestion, ear discharge, ear pain, hearing loss, nosebleeds, sinus pain, sore throat and tinnitus.   Eyes: Negative for blurred vision, double vision, photophobia, pain, discharge and redness.  Respiratory: Negative for cough, hemoptysis, sputum production, shortness of breath, wheezing and stridor.   Cardiovascular: Negative for chest pain, palpitations, orthopnea, claudication, leg swelling and PND.  Gastrointestinal: Negative for abdominal pain, blood in stool, constipation, diarrhea, heartburn, melena, nausea and vomiting.  Genitourinary: Negative for dysuria, flank pain, frequency, hematuria and urgency.       Reports vaginal wall prolapse.  Musculoskeletal:  Negative for back pain, falls, joint pain, myalgias and neck pain.  Skin: Negative for itching and rash.       Reports toenail fungus, bilateral  Neurological: Negative for dizziness, tingling, tremors, sensory change, speech change, focal weakness, seizures, loss of consciousness, weakness and headaches.  Endo/Heme/Allergies: Negative for environmental allergies and polydipsia. Does not bruise/bleed easily.  Psychiatric/Behavioral: Negative for depression, hallucinations, memory loss, substance abuse and suicidal ideas. The patient is not nervous/anxious and does not have insomnia.      Physical Exam: BP 124/86   Pulse 68   Temp 97.8 F (36.6 C)   Resp 16   Ht 5' 5.5" (1.664 m)   Wt 135 lb (61.2 kg)   SpO2 96%   BMI 22.12 kg/m  Wt Readings from Last 3 Encounters:  02/15/18 135 lb (61.2 kg)  11/08/17 136 lb 9.6 oz (62 kg)  09/08/17 139 lb (63 kg)   General Appearance: Well nourished, in no apparent distress. Eyes: PERRLA, EOMs, conjunctiva no swelling or erythema Sinuses: No Frontal/maxillary tenderness ENT/Mouth: Ext aud canals clear, TMs without erythema,  bulging. No erythema, swelling, or exudate on post pharynx.  Tonsils not swollen or erythematous. Hearing normal.  Neck: Supple, thyroid normal.  Respiratory: Respiratory effort normal, BS equal bilaterally without rales, rhonchi, wheezing or stridor.  Cardio: RRR with no MRGs. Brisk peripheral pulses without edema.  Abdomen: Soft, + BS.  Non tender, no guarding, rebound, hernias, masses. Lymphatics: Non tender without lymphadenopathy.  Musculoskeletal: Full ROM, 5/5 strength, Normal gait Skin: Warm, dry without rashes, lesions, ecchymosis. Wearing toenail polish today, UTA nail beds.  Neuro: Cranial nerves intact. No cerebellar symptoms.  Psych: Awake and oriented X 3, normal affect, Insight and Judgment appropriate.    Garnet Sierras, NP 2:58 PM [facility]

## 2018-02-15 NOTE — Patient Instructions (Addendum)
Know what a healthy weight is for you (roughly BMI <25) and aim to maintain this  Aim for 7+ servings of fruits and vegetables daily  65-80+ fluid ounces of water or unsweet tea for healthy kidneys  Limit to max 1 drink of alcohol per day; avoid smoking/tobacco  Limit animal fats in diet for cholesterol and heart health - choose grass fed whenever available  Avoid highly processed foods, and foods high in saturated/trans fats  Aim for low stress - take time to unwind and care for your mental health  Aim for 150 min of moderate intensity exercise weekly for heart health, and weights twice weekly for bone health  Aim for 7-9 hours of sleep daily    

## 2018-02-16 LAB — CBC WITH DIFFERENTIAL/PLATELET
Basophils Absolute: 53 cells/uL (ref 0–200)
Basophils Relative: 1.1 %
Eosinophils Absolute: 110 cells/uL (ref 15–500)
Eosinophils Relative: 2.3 %
HCT: 37.4 % (ref 35.0–45.0)
Hemoglobin: 12.4 g/dL (ref 11.7–15.5)
Lymphs Abs: 1541 {cells}/uL (ref 850–3900)
MCH: 31.3 pg (ref 27.0–33.0)
MCHC: 33.2 g/dL (ref 32.0–36.0)
MCV: 94.4 fL (ref 80.0–100.0)
MPV: 9.7 fL (ref 7.5–12.5)
Monocytes Relative: 13 %
Neutro Abs: 2472 {cells}/uL (ref 1500–7800)
Neutrophils Relative %: 51.5 %
Platelets: 186 10*3/uL (ref 140–400)
RBC: 3.96 Million/uL (ref 3.80–5.10)
RDW: 11.9 % (ref 11.0–15.0)
Total Lymphocyte: 32.1 %
WBC mixed population: 624 cells/uL (ref 200–950)
WBC: 4.8 Thousand/uL (ref 3.8–10.8)

## 2018-02-16 LAB — LIPID PANEL
Cholesterol: 198 mg/dL (ref <200)
HDL: 113 mg/dL (ref >50)
LDL Cholesterol (Calc): 70 mg/dL
Non-HDL Cholesterol (Calc): 85 mg/dL (ref <130)
Total CHOL/HDL Ratio: 1.8 (calc) (ref <5.0)
Triglycerides: 66 mg/dL (ref <150)

## 2018-02-16 LAB — COMPLETE METABOLIC PANEL WITHOUT GFR
Albumin: 4.6 g/dL (ref 3.6–5.1)
BUN: 14 mg/dL (ref 7–25)
Calcium: 9.6 mg/dL (ref 8.6–10.4)
GFR, Est Non African American: 72 mL/min/1.73m2 (ref >=60)
Glucose, Bld: 87 mg/dL (ref 65–99)
Total Bilirubin: 0.4 mg/dL (ref 0.2–1.2)
Total Protein: 5.8 g/dL — ABNORMAL LOW (ref 6.1–8.1)

## 2018-02-16 LAB — COMPLETE METABOLIC PANEL WITH GFR
AG Ratio: 3.8 (calc) — ABNORMAL HIGH (ref 1.0–2.5)
ALT: 25 U/L (ref 6–29)
AST: 31 U/L (ref 10–35)
Alkaline phosphatase (APISO): 101 U/L (ref 33–130)
CO2: 29 mmol/L (ref 20–32)
Chloride: 103 mmol/L (ref 98–110)
Creat: 0.83 mg/dL (ref 0.50–0.99)
GFR, Est African American: 84 mL/min/{1.73_m2} (ref >=60)
Globulin: 1.2 g/dL (calc) — ABNORMAL LOW (ref 1.9–3.7)
Potassium: 4.5 mmol/L (ref 3.5–5.3)
Sodium: 140 mmol/L (ref 135–146)

## 2018-02-16 LAB — HEMOGLOBIN A1C
Hgb A1c MFr Bld: 5.3 % of total Hgb (ref <5.7)
Mean Plasma Glucose: 105 (calc)
eAG (mmol/L): 5.8 (calc)

## 2018-02-16 LAB — VITAMIN D 25 HYDROXY (VIT D DEFICIENCY, FRACTURES): Vit D, 25-Hydroxy: 82 ng/mL (ref 30–100)

## 2018-02-16 LAB — MAGNESIUM: Magnesium: 1.8 mg/dL (ref 1.5–2.5)

## 2018-02-16 MED ORDER — ROSUVASTATIN CALCIUM 40 MG PO TABS: ORAL_TABLET | ORAL | Status: DC

## 2018-02-16 MED ORDER — FLUTICASONE PROPIONATE 50 MCG/ACT NA SUSP: 2.0000 | Freq: Every day | NASAL | 0 refills | Status: DC

## 2018-02-16 MED ORDER — BUPROPION HCL ER (XL) 150 MG PO TB24: 150.0000 mg | ORAL_TABLET | Freq: Every day | ORAL | 1 refills | Status: DC

## 2018-02-16 MED ORDER — GABAPENTIN 100 MG PO CAPS: 100.0000 mg | ORAL_CAPSULE | Freq: Three times a day (TID) | ORAL | 1 refills | Status: DC

## 2018-02-16 NOTE — Addendum Note (Signed)
Addended by: Garnet Sierras A on: 02/16/2018 04:47 PM   Modules accepted: Orders

## 2018-02-22 ENCOUNTER — Other Ambulatory Visit: Payer: Self-pay

## 2018-02-22 ENCOUNTER — Ambulatory Visit (HOSPITAL_COMMUNITY): Payer: PPO | Attending: Cardiovascular Disease

## 2018-02-22 DIAGNOSIS — I350 Nonrheumatic aortic (valve) stenosis: Secondary | ICD-10-CM | POA: Diagnosis not present

## 2018-02-23 ENCOUNTER — Telehealth: Payer: Self-pay | Admitting: Cardiovascular Disease

## 2018-02-23 DIAGNOSIS — I350 Nonrheumatic aortic (valve) stenosis: Secondary | ICD-10-CM

## 2018-02-23 NOTE — Telephone Encounter (Signed)
New Message ° ° °Patient is returning call in reference to echocardiogram results.  °

## 2018-02-23 NOTE — Telephone Encounter (Signed)
Patient is aware of echo results. Per Dr. Johnsie Cancel, EF normal moderate AS/AR fairly similar to last echo f/u TTE in a year. Patient verbalized understanding. Order placed for echo in one year.

## 2018-03-23 NOTE — Progress Notes (Signed)
Cardiology Office Note    Date:  03/25/2018   ID:  Kathryn Chavez, DOB 1950-08-28, MRN [id number]  PCP:  Unk Pinto, MD  Cardiologist:  Dr. Johnsie Cancel   Chief Complaint: Aortic Stenosis   History of Present Illness:   Kathryn Chavez is a 67 y.o. female with hx of HTN, HLD, breast cancer and moderate AS TTE 10/27/16 EF 55-60% mean gradient 15 mmhg peak 28 mmHg no change since 2015  HTN/HLD diet controlled   Works at [employer] had nice trip to [place] for [place]  Reviewed TTE done 02/22/18 EF 60-65% moderate AS mean gradient 19 peak 34 mmHg And now moderate AR   Needs Sacrocolpopexy with Mikle Bosworth from [place] From cardiac pesrpective she Is clear to have surgery She is active with no symptoms and TTE findings are stable   Past Medical History:  Diagnosis Date  . Allergy    rhinitis  . Aortic stenosis, moderate    on echo in 2015.  Marland Kitchen Cancer ([place])    breast  . Hyperlipidemia   . Hypertension   . Personal history of chemotherapy 2000  . Personal history of radiation therapy 2000    Past Surgical History:  Procedure Laterality Date  . ABDOMINAL HYSTERECTOMY    . BREAST LUMPECTOMY Right 2000   malignant    Current Medications: Prior to Admission medications   Medication Sig Start Date End Date Taking? Authorizing Provider  ALPRAZolam (XANAX XR) 0.5 MG 24 hr tablet Take 1 tablet (0.5 mg total) by mouth daily. 08/20/16   Vicie Mutters, PA-C  azelastine (ASTELIN) 0.1 % nasal spray Place 2 sprays into both nostrils 2 (two) times daily. Use in each nostril as directed 12/02/15 12/01/16  Forcucci, Courtney, PA-C  fluticasone (FLONASE) 50 MCG/ACT nasal spray Place 2 sprays into both nostrils daily. 05/26/16   Forcucci, Courtney, PA-C  gabapentin (NEURONTIN) 100 MG capsule TAKE (1) CAPSULE THREE TIMES DAILY. 08/19/16   Vicie Mutters, PA-C    Allergies:   Penicillamine; Penicillins; Sulfamethoxazole; and Sulfonamide derivatives   Social History    Socioeconomic History  . Marital status: Single    Spouse name: Not on file  . Number of children: Not on file  . Years of education: Not on file  . Highest education level: Not on file  Occupational History  . Not on file  Social Needs  . Financial resource strain: Not on file  . Food insecurity:    Worry: Not on file    Inability: Not on file  . Transportation needs:    Medical: Not on file    Non-medical: Not on file  Tobacco Use  . Smoking status: Never Smoker  . Smokeless tobacco: Never Used  Substance and Sexual Activity  . Alcohol use: No    Comment: social  . Drug use: No  . Sexual activity: Yes    Partners: Male    Birth control/protection: Surgical  Lifestyle  . Physical activity:    Days per week: Not on file    Minutes per session: Not on file  . Stress: Not on file  Relationships  . Social connections:    Talks on phone: Not on file    Gets together: Not on file    Attends religious service: Not on file    Active member of club or organization: Not on file    Attends meetings of clubs or organizations: Not on file    Relationship status: Not on file  Other Topics  Concern  . Not on file  Social History Narrative  . Not on file     Family History:  The patient's family history includes Arthritis in her other; Breast cancer in her other; Breast cancer (age of onset: 49) in her daughter; Breast cancer (age of onset: 50) in her daughter; Breast cancer (age of onset: 35) in her mother; Hyperlipidemia in her other; Lung cancer in her mother and other; Other in her father; Prostate cancer in her other.   ROS:   Please see the history of present illness.    ROS All other systems reviewed and are negative.   PHYSICAL EXAM:   VS:  BP 128/72   Pulse 77   Ht 5' 5.5" (1.664 m)   Wt 134 lb 12 oz (61.1 kg)   SpO2 99%   BMI 22.08 kg/m    Affect appropriate Healthy:  appears stated age 47: normal Neck supple with no adenopathy JVP normal no bruits no  thyromegaly Lungs clear with no wheezing and good diaphragmatic motion Heart:  S1/S2 AS  murmur, no rub, gallop or click PMI normal Abdomen: benighn, BS positve, no tenderness, no AAA no bruit.  No HSM or HJR Distal pulses intact with no bruits No edema Neuro non-focal Skin warm and dry No muscular weakness   Wt Readings from Last 3 Encounters:  03/25/18 134 lb 12 oz (61.1 kg)  02/15/18 135 lb (61.2 kg)  11/08/17 136 lb 9.6 oz (62 kg)      Studies/Labs Reviewed:   EKG:  EKG is ordered today.  SR Rate 60 ICRBBB low voltage   Recent Labs: 11/08/2017: TSH 3.97 02/15/2018: ALT 25; BUN 14; Creat 0.83; Hemoglobin 12.4; Magnesium 1.8; Platelets 186; Potassium 4.5; Sodium 140   Lipid Panel    Component Value Date/Time   CHOL 198 02/15/2018 0908   TRIG 66 02/15/2018 0908   HDL 113 02/15/2018 0908   CHOLHDL 1.8 02/15/2018 0908   VLDL 24 09/09/2016 1017   LDLCALC 70 02/15/2018 0908   LDLDIRECT 73.3 04/30/2011 0954    Additional studies/ records that were reviewed today include:   As above    ASSESSMENT & PLAN:    1. Moderate aortic stenosis - now with some AR EF normal f/u TTE November 2020   2. Hypertension - Stable and well controlled without any medical regimen.  3. Hyperlipidemia - She is not on any medication. Recent lipid profile was normal.  4. Preop: - clear to have OB/GYN and pelvic floor stabilizations surgery with general anesthesia    Jenkins Rouge

## 2018-03-25 ENCOUNTER — Ambulatory Visit: Payer: PPO | Admitting: Cardiovascular Disease

## 2018-03-25 ENCOUNTER — Encounter: Payer: Self-pay | Admitting: Cardiovascular Disease

## 2018-03-25 VITALS — BP 128/72 | HR 77 | Ht 65.5 in | Wt 134.8 lb

## 2018-03-25 DIAGNOSIS — I10 Essential (primary) hypertension: Secondary | ICD-10-CM | POA: Diagnosis not present

## 2018-03-25 DIAGNOSIS — I350 Nonrheumatic aortic (valve) stenosis: Secondary | ICD-10-CM

## 2018-03-25 NOTE — Patient Instructions (Signed)
Medication Instructions:   If you need a refill on your cardiac medications before your next appointment, please call your pharmacy.   Lab work:  If you have labs (blood work) drawn today and your tests are completely normal, you will receive your results only by: Marland Kitchen MyChart Message (if you have MyChart) OR . A paper copy in the mail If you have any lab test that is abnormal or we need to change your treatment, we will call you to review the results.  Testing/Procedures: Your physician has requested that you have an echocardiogram in one year. Echocardiography is a painless test that uses sound waves to create images of your heart. It provides your doctor with information about the size and shape of your heart and how well your heart's chambers and valves are working. This procedure takes approximately one hour. There are no restrictions for this procedure.  Follow-Up: At [facility], you and your health needs are our priority.  As part of our continuing mission to provide you with exceptional heart care, we have created designated Provider Care Teams.  These Care Teams include your primary Cardiologist (physician) and Advanced Practice Providers (APPs -  Physician Assistants and Nurse Practitioners) who all work together to provide you with the care you need, when you need it. You will need a follow up appointment in 12 months.  Please call our office 2 months in advance to schedule this appointment.  You may see Jenkins Rouge, MD or one of the following Advanced Practice Providers on your designated Care Team:   Truitt Merle, NP Cecilie Kicks, NP . Kathyrn Drown, NP

## 2018-04-05 ENCOUNTER — Other Ambulatory Visit: Payer: Self-pay | Admitting: Adult Health

## 2018-04-29 ENCOUNTER — Other Ambulatory Visit: Payer: Self-pay | Admitting: Internal Medicine

## 2018-04-29 MED ORDER — PREDNISONE 20 MG PO TABS: ORAL_TABLET | ORAL | 0 refills | Status: DC

## 2018-04-29 MED ORDER — AZITHROMYCIN 250 MG PO TABS: ORAL_TABLET | ORAL | 0 refills | Status: DC

## 2018-05-12 DIAGNOSIS — N812 Incomplete uterovaginal prolapse: Secondary | ICD-10-CM | POA: Diagnosis not present

## 2018-05-12 DIAGNOSIS — K660 Peritoneal adhesions (postprocedural) (postinfection): Secondary | ICD-10-CM | POA: Diagnosis not present

## 2018-05-12 DIAGNOSIS — N8110 Cystocele, unspecified: Secondary | ICD-10-CM | POA: Diagnosis not present

## 2018-05-12 DIAGNOSIS — N8189 Other female genital prolapse: Secondary | ICD-10-CM | POA: Diagnosis not present

## 2018-05-12 DIAGNOSIS — Z86718 Personal history of other venous thrombosis and embolism: Secondary | ICD-10-CM | POA: Diagnosis not present

## 2018-05-12 DIAGNOSIS — N8111 Cystocele, midline: Secondary | ICD-10-CM | POA: Diagnosis not present

## 2018-05-12 DIAGNOSIS — I808 Phlebitis and thrombophlebitis of other sites: Secondary | ICD-10-CM | POA: Diagnosis not present

## 2018-05-12 DIAGNOSIS — Z853 Personal history of malignant neoplasm of breast: Secondary | ICD-10-CM | POA: Diagnosis not present

## 2018-05-12 DIAGNOSIS — Z79810 Long term (current) use of selective estrogen receptor modulators (SERMs): Secondary | ICD-10-CM | POA: Diagnosis not present

## 2018-05-12 DIAGNOSIS — R011 Cardiac murmur, unspecified: Secondary | ICD-10-CM | POA: Diagnosis not present

## 2018-05-12 HISTORY — PX: CYSTOCELE REPAIR: SHX163

## 2018-05-16 ENCOUNTER — Encounter: Payer: Self-pay | Admitting: Internal Medicine

## 2018-06-07 ENCOUNTER — Ambulatory Visit (INDEPENDENT_AMBULATORY_CARE_PROVIDER_SITE_OTHER): Payer: PPO | Admitting: Internal Medicine

## 2018-06-07 ENCOUNTER — Encounter: Payer: Self-pay | Admitting: Internal Medicine

## 2018-06-07 VITALS — BP 120/82 | HR 72 | Temp 97.3°F | Resp 16 | Ht 65.5 in | Wt 130.2 lb

## 2018-06-07 DIAGNOSIS — Z1212 Encounter for screening for malignant neoplasm of rectum: Secondary | ICD-10-CM

## 2018-06-07 DIAGNOSIS — E559 Vitamin D deficiency, unspecified: Secondary | ICD-10-CM | POA: Diagnosis not present

## 2018-06-07 DIAGNOSIS — Z0000 Encounter for general adult medical examination without abnormal findings: Secondary | ICD-10-CM | POA: Diagnosis not present

## 2018-06-07 DIAGNOSIS — R7309 Other abnormal glucose: Secondary | ICD-10-CM

## 2018-06-07 DIAGNOSIS — Z136 Encounter for screening for cardiovascular disorders: Secondary | ICD-10-CM

## 2018-06-07 DIAGNOSIS — R0989 Other specified symptoms and signs involving the circulatory and respiratory systems: Secondary | ICD-10-CM

## 2018-06-07 DIAGNOSIS — Z79899 Other long term (current) drug therapy: Secondary | ICD-10-CM

## 2018-06-07 DIAGNOSIS — I350 Nonrheumatic aortic (valve) stenosis: Secondary | ICD-10-CM

## 2018-06-07 DIAGNOSIS — Z0001 Encounter for general adult medical examination with abnormal findings: Secondary | ICD-10-CM

## 2018-06-07 DIAGNOSIS — Z1211 Encounter for screening for malignant neoplasm of colon: Secondary | ICD-10-CM

## 2018-06-07 DIAGNOSIS — E782 Mixed hyperlipidemia: Secondary | ICD-10-CM | POA: Diagnosis not present

## 2018-06-07 NOTE — Progress Notes (Signed)
[facility] Unk Pinto, M.D.     Uvaldo Bristle. Silverio Lay, P.A.-C Liane Comber, [place] [address] [place] [address] Telephone [phone] Telefax [phone] Annual Screening/Preventative Visit & Comprehensive Evaluation &  Examination     This very nice 68 y.o. MWF  presents for a Screening /Preventative Visit & comprehensive evaluation and management of multiple medical co-morbidities.  Patient has been followed for HTN, HLD, Prediabetes  and Vitamin D Deficiency. She has hx/o Rt Breast Ca s/p Lumpectomy in 2000 treated with ChemoRadiation & completed 5 yrs Arimidex.      3 weeks ago, she had Sacral Colpopexy for repair of vaginal prolapse by Dr Maryland Pink at [facility] in [place].       Patient is followed expectantly for labile  HTN. Patient's BP has been controlled at home and patient denies any cardiac symptoms as chest pain, palpitations, shortness of breath, dizziness or ankle swelling.  Patient is Known to have Moderate AoS & is followed by Dr Johnsie Cancel. Today's BP is at goal - 120/82.      Patient's hyperlipidemia is controlled with diet and medications. Patient denies myalgias or other medication SE's. Last lipids were at goal: Lab Results  Component Value Date   CHOL 198 02/15/2018   HDL 113 02/15/2018   LDLCALC 70 02/15/2018   LDLDIRECT 73.3 04/30/2011   TRIG 66 02/15/2018   CHOLHDL 1.8 02/15/2018      Patient is followed expectantly for glucose intolerance & prediabetes and patient denies reactive hypoglycemic symptoms, visual blurring, diabetic polys or paresthesias. Last A1c was Normal: Lab Results  Component Value Date   HGBA1C 5.3 02/15/2018      Finally, patient has history of Vitamin D Deficiency ("25" / 2016)  and last Vitamin D was at goal:  Lab Results  Component Value Date   VD25OH 82 02/15/2018   Current Outpatient Medications on File Prior to Visit  Medication Sig  .  buPROPion- XL 150 MG  Take 1 tablet (150 mg total) by mouth daily.  . fexofenadine  60 MG tablet Take 60 mg by mouth 2 (two) times daily.  Marland Kitchen FLONASE nasal spray Place 2 sprays into both nostrils daily.  Marland Kitchen gabapentin  100 MG capsule Take 1 capsule (100 mg total) by mouth 3 (three) times daily.  . Rosuvastatin 40 MG tablet Take 1/2 to 1 tablet daily or as directed.  Marland Kitchen aspirin EC 81 MG tablet Take 81 mg by mouth daily. Takes infrequently due to bruising   Allergies  Allergen Reactions  . Penicillamine Hives  . Penicillins     REACTION: Hives  . Sulfamethoxazole Hives  . Sulfonamide Derivatives     REACTION: Hives   Past Medical History:  Diagnosis Date  . Allergy    rhinitis  . Aortic stenosis, moderate    on echo in 2015.  Marland Kitchen Cancer ([place])    breast  . Hyperlipidemia   . Hypertension   . Personal history of chemotherapy 2000  . Personal history of radiation therapy 2000   Health Maintenance  Topic Date Due  . Hepatitis C Screening  08/06/2018 (Originally 28-Dec-1950)  . MAMMOGRAM  11/05/2019  . TETANUS/TDAP  10/27/2020  . COLONOSCOPY  03/14/2022  . DEXA SCAN  Completed  . PNA vac Low Risk Adult  Completed  . INFLUENZA VACCINE  Discontinued   Immunization History  Administered Date(s) Administered  . Influenza Split 02/26/2011, 03/11/2012  . Influenza, High Dose Seasonal PF  01/29/2016  . Influenza,inj,Quad PF,6+ Mos 05/02/2013  . Pneumococcal Conjugate-13 08/05/2017  . Pneumococcal Polysaccharide-23 01/16/2015  . Tdap 10/28/2010   Last Colon - 03/14/2012 - Dr Watt Climes recc 10 yr f/u due Nov/Dec 2023  Last MGM - 11/04/2017  Past Surgical History:  Procedure Laterality Date  . ABDOMINAL HYSTERECTOMY    . BREAST LUMPECTOMY Right 2000   malignant   Family History  Problem Relation Age of Onset  . Breast cancer Mother 62  . Lung cancer Mother   . Other Father        BRAIN TUMOR  . Breast cancer Daughter 54  . Arthritis Other   . Hyperlipidemia Other   . Lung cancer  Other   . Prostate cancer Other   . Breast cancer Other   . Breast cancer Daughter 8   Social History   Tobacco Use  . Smoking status: Never Smoker  . Smokeless tobacco: Never Used  Substance Use Topics  . Alcohol use: No    Comment: social  . Drug use: No    ROS Constitutional: Denies fever, chills, weight loss/gain, headaches, insomnia,  night sweats, and change in appetite. Does c/o fatigue. Eyes: Denies redness, blurred vision, diplopia, discharge, itchy, watery eyes.  ENT: Denies discharge, congestion, post nasal drip, epistaxis, sore throat, earache, hearing loss, dental pain, Tinnitus, Vertigo, Sinus pain, snoring.  Cardio: Denies chest pain, palpitations, irregular heartbeat, syncope, dyspnea, diaphoresis, orthopnea, PND, claudication, edema Respiratory: denies cough, dyspnea, DOE, pleurisy, hoarseness, laryngitis, wheezing.  Gastrointestinal: Denies dysphagia, heartburn, reflux, water brash, pain, cramps, nausea, vomiting, bloating, diarrhea, constipation, hematemesis, melena, hematochezia, jaundice, hemorrhoids Genitourinary: Denies dysuria, frequency, urgency, nocturia, hesitancy, discharge, hematuria, flank pain Breast: Breast lumps, nipple discharge, bleeding.  Musculoskeletal: Denies arthralgia, myalgia, stiffness, Jt. Swelling, pain, limp, and strain/sprain. Denies falls. Skin: Denies puritis, rash, hives, warts, acne, eczema, changing in skin lesion Neuro: No weakness, tremor, incoordination, spasms, paresthesia, pain Psychiatric: Denies confusion, memory loss, sensory loss. Denies Depression. Endocrine: Denies change in weight, skin, hair change, nocturia, and paresthesia, diabetic polys, visual blurring, hyper / hypo glycemic episodes.  Heme/Lymph: No excessive bleeding, bruising, enlarged lymph nodes.  Physical Exam  BP 120/82   Pulse 72   Temp (!) 97.3 F (36.3 C)   Resp 16   Ht 5' 5.5" (1.664 m)   Wt 130 lb 3.2 oz (59.1 kg)   BMI 21.34 kg/m   General  Appearance: Well nourished, well groomed and in no apparent distress.  Eyes: PERRLA, EOMs, conjunctiva no swelling or erythema, normal fundi and vessels. Sinuses: No frontal/maxillary tenderness ENT/Mouth: EACs patent / TMs  nl. Nares clear without erythema, swelling, mucoid exudates. Oral hygiene is good. No erythema, swelling, or exudate. Tongue normal, non-obstructing. Tonsils not swollen or erythematous. Hearing normal.  Neck: Supple, thyroid not palpable. No bruits, nodes or JVD. Respiratory: Respiratory effort normal.  BS equal and clear bilateral without rales, rhonci, wheezing or stridor. Cardio: Heart sounds are normal with regular rate and rhythm and no murmurs, rubs or gallops. Peripheral pulses are normal and equal bilaterally without edema. No aortic or femoral bruits. Chest: symmetric with normal excursions and percussion. Breasts: Symmetric, without lumps, nipple discharge, retractions, or fibrocystic changes.  Abdomen: Flat, soft with bowel sounds active. Nontender, no guarding, rebound, hernias, masses, or organomegaly.  Lymphatics: Non tender without lymphadenopathy.  Musculoskeletal: Full ROM all peripheral extremities, joint stability, 5/5 strength, and normal gait. Skin: Warm and dry without rashes, lesions, cyanosis, clubbing or  ecchymosis.  Neuro: Cranial nerves intact,  reflexes equal bilaterally. Normal muscle tone, no cerebellar symptoms. Sensation intact.  Pysch: Alert and oriented X 3, normal affect, Insight and Judgment appropriate.   Assessment and Plan  1. Annual Preventative Screening Examination  2. Labile hypertension  - EKG 12-Lead - Urinalysis, Routine w reflex microscopic - Microalbumin / creatinine urine ratio - CBC with Differential/Platelet - COMPLETE METABOLIC PANEL WITH GFR - Magnesium - TSH  3. Hyperlipidemia, mixed  - EKG 12-Lead - Lipid panel - TSH  4. Abnormal glucose  - EKG 12-Lead - Hemoglobin A1c - Insulin, random  5. Vitamin  D deficiency  - VITAMIN D 25 Hydroxyl  6. Aortic stenosis, moderate  - EKG 12-Lead  7. Screening for colorectal cancer  - POC Hemoccult Bld/Stl  8. Screening for ischemic heart disease  - EKG 12-Lead  9. Medication management  - Urinalysis, Routine w reflex microscopic - Microalbumin / creatinine urine ratio - CBC with Differential/Platelet - COMPLETE METABOLIC PANEL WITH GFR - Magnesium - Lipid panel - TSH - Hemoglobin A1c - Insulin, random - VITAMIN D 25 Hydroxyl       Patient was counseled in prudent diet to achieve/maintain BMI less than 25 for weight control, BP monitoring, regular exercise and medications. Discussed med's effects and SE's. Screening labs and tests as requested with regular follow-up as recommended. Over 40 minutes of exam, counseling, chart review and high complex critical decision making was performed.

## 2018-06-07 NOTE — Patient Instructions (Signed)

## 2018-06-08 LAB — VITAMIN D 25 HYDROXY (VIT D DEFICIENCY, FRACTURES): Vit D, 25-Hydroxy: 74 ng/mL (ref 30–100)

## 2018-06-08 LAB — INSULIN, RANDOM: INSULIN: 4.1 u[IU]/mL

## 2018-06-08 LAB — COMPLETE METABOLIC PANEL WITH GFR
AG RATIO: 2.7 (calc) — AB (ref 1.0–2.5)
ALT: 23 U/L (ref 6–29)
AST: 26 U/L (ref 10–35)
Albumin: 4.9 g/dL (ref 3.6–5.1)
Alkaline phosphatase (APISO): 111 U/L (ref 37–153)
BUN: 15 mg/dL (ref 7–25)
CO2: 24 mmol/L (ref 20–32)
CREATININE: 0.82 mg/dL (ref 0.50–0.99)
Calcium: 10.3 mg/dL (ref 8.6–10.4)
Chloride: 102 mmol/L (ref 98–110)
GFR, EST AFRICAN AMERICAN: 85 mL/min/{1.73_m2} (ref >=60)
GFR, EST NON AFRICAN AMERICAN: 73 mL/min/{1.73_m2} (ref >=60)
GLOBULIN: 1.8 g/dL — AB (ref 1.9–3.7)
Glucose, Bld: 89 mg/dL (ref 65–99)
POTASSIUM: 4.9 mmol/L (ref 3.5–5.3)
Sodium: 141 mmol/L (ref 135–146)
TOTAL PROTEIN: 6.7 g/dL (ref 6.1–8.1)
Total Bilirubin: 0.5 mg/dL (ref 0.2–1.2)

## 2018-06-08 LAB — URINALYSIS, ROUTINE W REFLEX MICROSCOPIC
Bilirubin Urine: NEGATIVE
GLUCOSE, UA: NEGATIVE
Hgb urine dipstick: NEGATIVE
Ketones, ur: NEGATIVE
LEUKOCYTE UA: NEGATIVE
Nitrite: NEGATIVE
Protein, ur: NEGATIVE
Specific Gravity, Urine: 1.005 (ref 1.001–1.03)
pH: 7 (ref 5.0–8.0)

## 2018-06-08 LAB — CBC WITH DIFFERENTIAL/PLATELET
ABSOLUTE MONOCYTES: 465 {cells}/uL (ref 200–950)
BASOS PCT: 0.8 %
Basophils Absolute: 38 cells/uL (ref 0–200)
Eosinophils Absolute: 169 cells/uL (ref 15–500)
Eosinophils Relative: 3.6 %
HEMATOCRIT: 41.3 % (ref 35.0–45.0)
Hemoglobin: 14 g/dL (ref 11.7–15.5)
LYMPHS ABS: 1889 {cells}/uL (ref 850–3900)
MCH: 31.7 pg (ref 27.0–33.0)
MCHC: 33.9 g/dL (ref 32.0–36.0)
MCV: 93.4 fL (ref 80.0–100.0)
MPV: 10.6 fL (ref 7.5–12.5)
Monocytes Relative: 9.9 %
NEUTROS PCT: 45.5 %
Neutro Abs: 2139 cells/uL (ref 1500–7800)
PLATELETS: 172 10*3/uL (ref 140–400)
RBC: 4.42 10*6/uL (ref 3.80–5.10)
RDW: 12.3 % (ref 11.0–15.0)
TOTAL LYMPHOCYTE: 40.2 %
WBC: 4.7 10*3/uL (ref 3.8–10.8)

## 2018-06-08 LAB — LIPID PANEL
CHOLESTEROL: 286 mg/dL — AB (ref <200)
HDL: 119 mg/dL (ref >=50)
LDL Cholesterol (Calc): 148 mg/dL (calc) — ABNORMAL HIGH
Non-HDL Cholesterol (Calc): 167 mg/dL (calc) — ABNORMAL HIGH (ref <130)
Total CHOL/HDL Ratio: 2.4 (calc) (ref <5.0)
Triglycerides: 85 mg/dL (ref <150)

## 2018-06-08 LAB — TSH: TSH: 3.33 mIU/L (ref 0.40–4.50)

## 2018-06-08 LAB — MICROALBUMIN / CREATININE URINE RATIO: Creatinine, Urine: 22 mg/dL (ref 20–275)

## 2018-06-08 LAB — MAGNESIUM: Magnesium: 1.9 mg/dL (ref 1.5–2.5)

## 2018-06-08 LAB — HEMOGLOBIN A1C
Hgb A1c MFr Bld: 5.2 % of total Hgb (ref <5.7)
Mean Plasma Glucose: 103 (calc)
eAG (mmol/L): 5.7 (calc)

## 2018-06-28 ENCOUNTER — Other Ambulatory Visit: Payer: Self-pay | Admitting: Internal Medicine

## 2018-06-28 MED ORDER — ESTRADIOL 0.1 MG/GM VA CREA: 1.0000 | TOPICAL_CREAM | Freq: Every day | VAGINAL | 3 refills | Status: DC

## 2018-07-05 ENCOUNTER — Other Ambulatory Visit: Payer: Self-pay | Admitting: Adult Health

## 2018-08-01 ENCOUNTER — Other Ambulatory Visit: Payer: Self-pay | Admitting: Internal Medicine

## 2018-08-01 ENCOUNTER — Other Ambulatory Visit: Payer: Self-pay | Admitting: Adult Health Nurse Practitioner

## 2018-08-01 DIAGNOSIS — F418 Other specified anxiety disorders: Secondary | ICD-10-CM

## 2018-08-09 DIAGNOSIS — L570 Actinic keratosis: Secondary | ICD-10-CM | POA: Diagnosis not present

## 2018-08-09 DIAGNOSIS — Z85828 Personal history of other malignant neoplasm of skin: Secondary | ICD-10-CM | POA: Diagnosis not present

## 2018-08-16 ENCOUNTER — Ambulatory Visit: Payer: Self-pay | Admitting: Adult Health

## 2018-08-18 ENCOUNTER — Other Ambulatory Visit: Payer: Self-pay | Admitting: Internal Medicine

## 2018-08-29 ENCOUNTER — Other Ambulatory Visit: Payer: Self-pay | Admitting: *Deleted

## 2018-08-29 MED ORDER — ESTRADIOL 0.1 MG/GM VA CREA: 1.0000 | TOPICAL_CREAM | Freq: Every day | VAGINAL | 3 refills | Status: DC

## 2018-08-31 ENCOUNTER — Other Ambulatory Visit: Payer: Self-pay | Admitting: *Deleted

## 2018-08-31 MED ORDER — VAGINAL CREAM APPLICATOR MISC: 1 refills | Status: DC

## 2018-08-31 NOTE — Progress Notes (Signed)
Misc. natural

## 2018-09-14 DIAGNOSIS — L570 Actinic keratosis: Secondary | ICD-10-CM | POA: Diagnosis not present

## 2018-09-14 DIAGNOSIS — D0472 Carcinoma in situ of skin of left lower limb, including hip: Secondary | ICD-10-CM | POA: Diagnosis not present

## 2018-09-14 DIAGNOSIS — C44321 Squamous cell carcinoma of skin of nose: Secondary | ICD-10-CM | POA: Diagnosis not present

## 2018-09-14 DIAGNOSIS — D485 Neoplasm of uncertain behavior of skin: Secondary | ICD-10-CM | POA: Diagnosis not present

## 2018-09-14 DIAGNOSIS — Z85828 Personal history of other malignant neoplasm of skin: Secondary | ICD-10-CM | POA: Diagnosis not present

## 2018-09-14 DIAGNOSIS — C44329 Squamous cell carcinoma of skin of other parts of face: Secondary | ICD-10-CM | POA: Diagnosis not present

## 2018-09-15 NOTE — Progress Notes (Signed)
MEDICARE ANNUAL WELLNESS VISIT AND FOLLOW UP  Assessment:   Encounter for Medicare annual wellness exam 1 year  Essential hypertension At goal without medications at this time Monitor blood pressure at home; call if consistently over 130/80 Continue DASH diet.   Reminder to go to the ER if any CP, SOB, nausea, dizziness, severe HA, changes vision/speech, left arm numbness and tingling and jaw pain.  Mixed hyperlipidemia Continue low cholesterol diet and exercise.  Check lipid panel.   BMI 22.0-22.9, adult Continue to recommend diet heavy in fruits and veggies and low in animal meats, cheeses, and dairy products, appropriate calorie intake Discuss exercise recommendations routinely Continue to monitor weight at each visit  Aortic stenosis, moderate Followed by Dr. Johnsie Cancel  Seasonal allergic rhinitis, unspecified trigger - Allegra OTC, increase H20, allergy hygiene explained.  Vitamin D deficient osteomalacia Vitamin D at goal at recent check; continue to recommend supplementation for goal of 70-100 Defer vitamin D level  Female cystocele Doing much better after surgery  Depression/anxiety Doing well with wellbutrin, continue  Osteopenia Continue vitamin D, add weight bearing exercises.   Over 30 minutes of exam, counseling, chart review and critical decision making was performed Declines labs, will get next OV Future Appointments  Date Time Provider [place]  12/21/2018  9:30 AM Unk Pinto, MD [facility] None  06/27/2019  9:00 AM Unk Pinto, MD [facility] None     Plan:   During the course of the visit the patient was educated and counseled about appropriate screening and preventive services including:    Pneumococcal vaccine   Prevnar 13  Influenza vaccine  Td vaccine  Screening electrocardiogram  Bone densitometry screening  Colorectal cancer screening  Diabetes screening  Glaucoma screening  Nutrition counseling   Advanced  directives: requested   Subjective:  Kathryn Chavez is a 68 y.o. female who presents for Medicare Annual Wellness Visit and 3 month follow up.   She also has significant hx/o R lumpectomy in 2001 treated with Chemoradiation and followed with 5 years of Arimidex. She is followed by Dr. Johnsie Cancel for moderate aortic stenosis. 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    She is doing very well on the wellbutrin, she very rarely takes xanax.   BMI is Body mass index is 20.78 kg/m., she has been working on diet and exercise. Wt Readings from Last 3 Encounters:  09/20/18 126 lb 12.8 oz (57.5 kg)  06/07/18 130 lb 3.2 oz (59.1 kg)  03/25/18 134 lb 12 oz (61.1 kg)    Her blood pressure has been controlled at home, today their BP is BP: 120/76 She does workout. She denies chest pain, shortness of breath, dizziness.   She is on cholesterol medication, increased to 1 tablet of the crestor last visit and denies myalgias. Her cholesterol is not at goal. The cholesterol last visit was:   Lab Results  Component Value Date   CHOL 286 (H) 06/07/2018   HDL 119 06/07/2018   LDLCALC 148 (H) 06/07/2018   LDLDIRECT 73.3 04/30/2011   TRIG 85 06/07/2018   CHOLHDL 2.4 06/07/2018    Last A1C in the office was:  Lab Results  Component Value Date   HGBA1C 5.2 06/07/2018   Last GFR: Lab Results  Component Value Date   GFRNONAA 73 06/07/2018   Patient is on Vitamin D supplement and at goal at recent check:    Lab  Results  Component Value Date   VD25OH 74 06/07/2018      Medication Review: Current Outpatient Medications on File Prior to Visit  Medication Sig Dispense Refill  . ALPRAZolam (XANAX) 0.5 MG tablet TAKE 1/2-1 TABLET UP TO TWICE A DAY AS NEEDED FOR ANXIETY/ SLEEP;LIMIT USE TO LESS THAN 5 DAYS/WEEK. 30 tablet 0  . aspirin EC 81 MG tablet Take 81 mg by mouth daily.    Marland Kitchen buPROPion (WELLBUTRIN XL)  150 MG 24 hr tablet TAKE 1 TABLET EACH DAY. 90 tablet 0  . fexofenadine (ALLEGRA) 60 MG tablet Take 60 mg by mouth 2 (two) times daily.    . fluticasone (FLONASE) 50 MCG/ACT nasal spray Place 2 sprays into both nostrils daily. 16 g 0  . gabapentin (NEURONTIN) 100 MG capsule TAKE (1) CAPSULE THREE TIMES DAILY. 270 capsule 0  . Misc. Devices (VAGINAL CREAM APPLICATOR) MISC Patient is to apply pea size amount to vaginal area 2-3 times a week. 1 each 1  . rosuvastatin (CRESTOR) 40 MG tablet TAKE 1/2 TO 1 TABLET DAILY OR AS DIRECTED FOR CHOLESTEROL. 90 tablet 0   No current facility-administered medications on file prior to visit.     Allergies  Allergen Reactions  . Penicillamine Hives  . Penicillins     REACTION: Hives  . Sulfamethoxazole Hives  . Sulfonamide Derivatives     REACTION: Hives    Current Problems (verified) Patient Active Problem List   Diagnosis Date Noted  . Osteopenia 11/04/2017  . Onychomycosis of great toe 09/08/2017  . Aortic stenosis, moderate 09/09/2016  . Hyperlipidemia 01/16/2015  . Abnormal ECG 01/23/2014  . Female cystocele 02/09/2011  . Vitamin D deficient osteomalacia 10/28/2010  . Essential hypertension 12/16/2009  . Allergic rhinitis 12/16/2009    Screening Tests Immunization History  Administered Date(s) Administered  . Influenza Split 02/26/2011, 03/11/2012  . Influenza, High Dose Seasonal PF 01/29/2016  . Influenza,inj,Quad PF,6+ Mos 05/02/2013  . Pneumococcal Conjugate-13 08/05/2017  . Pneumococcal Polysaccharide-23 01/16/2015  . Tdap 10/28/2010   Preventative care: Last colonoscopy: 2013 Last mammogram: 10/2017 Last pap smear/pelvic exam: 2010, declines further, hx hysterectomy   DEXA: 10/2017 Femur Neck Left is 0.852 g/cm2 with a T-score of -1.3. Echo 02/2018  Prior vaccinations: TD or Tdap: 2012  Influenza: 2017 Pneumococcal: 2016 Prevnar13: 2019 Shingles/Zostavax: n/a in office  Names of Other Physician/Practitioners you  currently use: 1. [facility] here for primary care 2. Dr. [place], eye doctor, last visit 07/2017 3. Dr. Marland Kitchen , dentist, last visit 2018 due for follow up   Patient Care Team: Unk Pinto, MD as PCP - General (Internal Medicine) Josue Hector, MD as PCP - Cardiology (Cardiology) [place], [place], [place] (Optometry)  SURGICAL HISTORY She  has a past surgical history that includes Abdominal hysterectomy and Breast lumpectomy (Right, 2000). FAMILY HISTORY Her family history includes Arthritis in an other family member; Breast cancer in an other family member; Breast cancer (age of onset: 86) in her daughter; Breast cancer (age of onset: 45) in her daughter; Breast cancer (age of onset: 33) in her mother; Hyperlipidemia in an other family member; Lung cancer in her mother and another family member; Other in her father; Prostate cancer in an other family member. SOCIAL HISTORY She  reports that she has never smoked. She has never used smokeless tobacco. She reports that she does not drink alcohol or use drugs.  MEDICARE WELLNESS OBJECTIVES: Physical activity:   Cardiac risk factors:   Depression/mood screen:  Depression screen [facility] 2/9 06/07/2018  Decreased Interest 0  Down, Depressed, Hopeless 0  PHQ - 2 Score 0    ADLs:  In your present state of health, do you have any difficulty performing the following activities: 06/07/2018 11/08/2017  Hearing? N N  Vision? N N  Difficulty concentrating or making decisions? N N  Walking or climbing stairs? N N  Dressing or bathing? N N  Doing errands, shopping? N N  Some recent data might be hidden     Cognitive Testing  Alert? Yes  Normal Appearance?Yes  Oriented to person? Yes  Place? Yes   Time? Yes  Recall of three objects?  Yes  Can perform simple calculations? Yes  Displays appropriate judgment?Yes  Can read the correct time from a watch face?Yes  EOL planning: Does Patient Have a Medical Advance  Directive?: Yes Type of Advance Directive: Healthcare Power of Attorney, Living will [place] in Chart?: No - copy requested  Review of Systems  Constitutional: Negative for malaise/fatigue and weight loss.  HENT: Negative for hearing loss and tinnitus.   Eyes: Negative for blurred vision and double vision.  Respiratory: Negative for cough, sputum production, shortness of breath and wheezing.   Cardiovascular: Negative for chest pain, palpitations, orthopnea, claudication, leg swelling and PND.  Gastrointestinal: Negative for abdominal pain, blood in stool, constipation, diarrhea, heartburn, melena, nausea and vomiting.  Genitourinary: Negative.   Musculoskeletal: Negative for falls, joint pain and myalgias.  Skin: Negative for rash.  Neurological: Negative for dizziness, tingling, sensory change, weakness and headaches.  Endo/Heme/Allergies: Negative for polydipsia.  Psychiatric/Behavioral: Negative.  Negative for depression, memory loss, substance abuse and suicidal ideas. The patient is not nervous/anxious and does not have insomnia.   All other systems reviewed and are negative.    Objective:     Today's Vitals   09/20/18 0903  BP: 120/76  Pulse: 66  Temp: (!) 97.5 F (36.4 C)  SpO2: 96%  Weight: 126 lb 12.8 oz (57.5 kg)  Height: 5' 5.5" (1.664 m)  PainSc: 0-No pain   Body mass index is 20.78 kg/m.  General appearance: alert, no distress, WD/WN, female HEENT: normocephalic, sclerae anicteric, TMs pearly, nares patent, no discharge or erythema, pharynx normal Oral cavity: MMM, no lesions Neck: supple, no lymphadenopathy, no thyromegaly, no masses Heart: RRR, normal S1, S2, no murmurs Lungs: CTA bilaterally, no wheezes, rhonchi, or rales Abdomen: +bs, soft, non tender, non distended, no masses, no hepatomegaly, no splenomegaly Musculoskeletal: nontender, no swelling, no obvious deformity Extremities: no edema, no cyanosis, no  clubbing Pulses: 2+ symmetric, upper and lower extremities, normal cap refill Neurological: alert, oriented x 3, CN2-12 intact, strength normal upper extremities and lower extremities, sensation normal throughout, DTRs 2+ throughout, no cerebellar signs, gait normal Psychiatric: normal affect, behavior normal, pleasant   Medicare Attestation I have personally reviewed: The patient's medical and social history Their use of alcohol, tobacco or illicit drugs Their current medications and supplements The patient's functional ability including ADLs,fall risks, home safety risks, cognitive, and hearing and visual impairment Diet and physical activities Evidence for depression or mood disorders  The patient's weight, height, BMI, and visual acuity have been recorded in the chart.  I have made referrals, counseling, and provided education to the patient based on review of the above and I have provided the patient with a written personalized care plan for preventive services.     Vicie Mutters, PA-C   09/20/2018

## 2018-09-19 DIAGNOSIS — H16223 Keratoconjunctivitis sicca, not specified as Sjogren's, bilateral: Secondary | ICD-10-CM | POA: Diagnosis not present

## 2018-09-19 DIAGNOSIS — H2513 Age-related nuclear cataract, bilateral: Secondary | ICD-10-CM | POA: Diagnosis not present

## 2018-09-20 ENCOUNTER — Ambulatory Visit: Payer: Self-pay | Admitting: Adult Health

## 2018-09-20 ENCOUNTER — Other Ambulatory Visit: Payer: Self-pay

## 2018-09-20 ENCOUNTER — Encounter: Payer: Self-pay | Admitting: Physician Assistant

## 2018-09-20 ENCOUNTER — Ambulatory Visit (INDEPENDENT_AMBULATORY_CARE_PROVIDER_SITE_OTHER): Payer: PPO | Admitting: Physician Assistant

## 2018-09-20 VITALS — BP 120/76 | HR 66 | Temp 97.5°F | Ht 65.5 in | Wt 126.8 lb

## 2018-09-20 DIAGNOSIS — M839 Adult osteomalacia, unspecified: Secondary | ICD-10-CM

## 2018-09-20 DIAGNOSIS — I10 Essential (primary) hypertension: Secondary | ICD-10-CM

## 2018-09-20 DIAGNOSIS — E782 Mixed hyperlipidemia: Secondary | ICD-10-CM | POA: Diagnosis not present

## 2018-09-20 DIAGNOSIS — N8110 Cystocele, unspecified: Secondary | ICD-10-CM

## 2018-09-20 DIAGNOSIS — J302 Other seasonal allergic rhinitis: Secondary | ICD-10-CM | POA: Diagnosis not present

## 2018-09-20 DIAGNOSIS — R6889 Other general symptoms and signs: Secondary | ICD-10-CM

## 2018-09-20 DIAGNOSIS — Z0001 Encounter for general adult medical examination with abnormal findings: Secondary | ICD-10-CM

## 2018-09-20 DIAGNOSIS — M85852 Other specified disorders of bone density and structure, left thigh: Secondary | ICD-10-CM | POA: Diagnosis not present

## 2018-09-20 DIAGNOSIS — B351 Tinea unguium: Secondary | ICD-10-CM | POA: Diagnosis not present

## 2018-09-20 DIAGNOSIS — Z0000 Encounter for general adult medical examination without abnormal findings: Secondary | ICD-10-CM

## 2018-09-20 DIAGNOSIS — I350 Nonrheumatic aortic (valve) stenosis: Secondary | ICD-10-CM

## 2018-09-20 NOTE — Patient Instructions (Signed)
Add in weight lifting and continue your vitamin D  Ask insurance and pharmacy about shingrix - new vaccine   Can go to [email or web address] for more information  Shingrix Vaccination  Two vaccines are licensed and recommended to prevent shingles in the [place] Zoster vaccine live (ZVL, Zostavax) has been in use since 2006. Recombinant zoster vaccine (RZV, Shingrix), has been in use since 2017 and is recommended by ACIP as the preferred shingles vaccine.  What Everyone Should Know about Shingles Vaccine (Shingrix) One of the Recommended Vaccines by Disease Shingles vaccination is the only way to protect against shingles and postherpetic neuralgia (PHN), the most common complication from shingles. CDC recommends that healthy adults 50 years and older get two doses of the shingles vaccine called Shingrix (recombinant zoster vaccine), separated by 2 to 6 months, to prevent shingles and the complications from the disease. Your doctor or pharmacist can give you Shingrix as a shot in your upper arm. Shingrix provides strong protection against shingles and PHN. Two doses of Shingrix is more than 90% effective at preventing shingles and PHN. Protection stays above 85% for at least the first four years after you get vaccinated. Shingrix is the preferred vaccine, over Zostavax (zoster vaccine live), a shingles vaccine in use since 2006. Zostavax may still be used to prevent shingles in healthy adults 60 years and older. For example, you could use Zostavax if a person is allergic to Shingrix, prefers Zostavax, or requests immediate vaccination and Shingrix is unavailable. Who Should Get Shingrix? Healthy adults 50 years and older should get two doses of Shingrix, separated by 2 to 6 months. You should get Shingrix even if in the past you . had shingles  . received Zostavax  . are not sure if you had chickenpox There is no maximum age for getting Shingrix. If  you had shingles in the past, you can get Shingrix to help prevent future occurrences of the disease. There is no specific length of time that you need to wait after having shingles before you can receive Shingrix, but generally you should make sure the shingles rash has gone away before getting vaccinated. You can get Shingrix whether or not you remember having had chickenpox in the past. Studies show that more than 99% of Americans 40 years and older have had chickenpox, even if they don't remember having the disease. Chickenpox and shingles are related because they are caused by the same virus (varicella zoster virus). After a person recovers from chickenpox, the virus stays dormant (inactive) in the body. It can reactivate years later and cause shingles. If you had Zostavax in the recent past, you should wait at least eight weeks before getting Shingrix. Talk to your healthcare provider to determine the best time to get Shingrix. Shingrix is available in [employer] and pharmacies. To find doctor's offices or pharmacies near you that offer the vaccine, visit HealthMap Vaccine FinderExternal. If you have questions about Shingrix, talk with your healthcare provider. Vaccine for Those 30 Years and Older  Shingrix reduces the risk of shingles and PHN by more than 90% in people 7 and older. CDC recommends the vaccine for healthy adults 26 and older.  Who Should Not Get Shingrix? You should not get Shingrix if you: . have ever had a severe allergic reaction to any component of the vaccine or after a dose of Shingrix  . tested negative for immunity to varicella zoster virus. If you test negative, you should get chickenpox vaccine.  . currently have shingles  .  currently are pregnant or breastfeeding. Women who are pregnant or breastfeeding should wait to get Shingrix.  Marland Kitchen receive specific antiviral drugs (acyclovir, famciclovir, or valacyclovir) 24 hours before vaccination (avoid use of these  antiviral drugs for 14 days after vaccination)- zoster vaccine live only If you have a minor acute (starts suddenly) illness, such as a cold, you may get Shingrix. But if you have a moderate or severe acute illness, you should usually wait until you recover before getting the vaccine. This includes anyone with a temperature of 101.64F or higher. The side effects of the Shingrix are temporary, and usually last 2 to 3 days. While you may experience pain for a few days after getting Shingrix, the pain will be less severe than having shingles and the complications from the disease. How Well Does Shingrix Work? Two doses of Shingrix provides strong protection against shingles and postherpetic neuralgia (PHN), the most common complication of shingles. . In adults 76 to 69 years old who got two doses, Shingrix was 97% effective in preventing shingles; among adults 70 years and older, Shingrix was 91% effective.  . In adults 21 to 69 years old who got two doses, Shingrix was 91% effective in preventing PHN; among adults 70 years and older, Shingrix was 89% effective. Shingrix protection remained high (more than 85%) in people 70 years and older throughout the four years following vaccination. Since your risk of shingles and PHN increases as you get older, it is important to have strong protection against shingles in your older years. Top of Page  What Are the Possible Side Effects of Shingrix? Studies show that Shingrix is safe. The vaccine helps your body create a strong defense against shingles. As a result, you are likely to have temporary side effects from getting the shots. The side effects may affect your ability to do normal daily activities for 2 to 3 days. Most people got a sore arm with mild or moderate pain after getting Shingrix, and some also had redness and swelling where they got the shot. Some people felt tired, had muscle pain, a headache, shivering, fever, stomach pain, or nausea. About 1 out of  6 people who got Shingrix experienced side effects that prevented them from doing regular activities. Symptoms went away on their own in about 2 to 3 days. Side effects were more common in younger people. You might have a reaction to the first or second dose of Shingrix, or both doses. If you experience side effects, you may choose to take over-the-counter pain medicine such as ibuprofen or acetaminophen. If you experience side effects from Shingrix, you should report them to the Vaccine Adverse Event Reporting System (VAERS). Your doctor might file this report, or you can do it yourself through the VAERS websiteExternal, or by calling [phone]. If you have any questions about side effects from Shingrix, talk with your doctor. The shingles vaccine does not contain thimerosal (a preservative containing mercury). Top of Page  When Should I See a Doctor Because of the Side Effects I Experience From Shingrix? In clinical trials, Shingrix was not associated with serious adverse events. In fact, serious side effects from vaccines are extremely rare. For example, for every 1 million doses of a vaccine given, only one or two people may have a severe allergic reaction. Signs of an allergic reaction happen within minutes or hours after vaccination and include hives, swelling of the face and throat, difficulty breathing, a fast heartbeat, dizziness, or weakness. If you experience these or any  other life-threatening symptoms, see a doctor right away. Shingrix causes a strong response in your immune system, so it may produce short-term side effects more intense than you are used to from other vaccines. These side effects can be uncomfortable, but they are expected and usually go away on their own in 2 or 3 days. Top of Page  How Can I Pay For Shingrix? There are several ways shingles vaccine may be paid for: Medicare . Medicare Part D plans cover the shingles vaccine, but there may be a cost to you depending  on your plan. There may be a copay for the vaccine, or you may need to pay in full then get reimbursed for a certain amount.  . Medicare Part B does not cover the shingles vaccine. Medicaid . Medicaid may or may not cover the vaccine. Contact your insurer to find out. Private health insurance . Many private health insurance plans will cover the vaccine, but there may be a cost to you depending on your plan. Contact your insurer to find out. Vaccine assistance programs . Some pharmaceutical companies provide vaccines to eligible adults who cannot afford them. You may want to check with the vaccine manufacturer, [employer], about Shingrix. If you do not currently have health insurance, learn more about affordable health coverage optionsExternal. To find doctor's offices or pharmacies near you that offer the vaccine, visit HealthMap Vaccine FinderExternal.

## 2018-10-07 ENCOUNTER — Other Ambulatory Visit: Payer: Self-pay | Admitting: Adult Health

## 2018-10-07 DIAGNOSIS — Z1231 Encounter for screening mammogram for malignant neoplasm of breast: Secondary | ICD-10-CM

## 2018-11-09 ENCOUNTER — Other Ambulatory Visit: Payer: Self-pay | Admitting: Adult Health

## 2018-11-09 DIAGNOSIS — F418 Other specified anxiety disorders: Secondary | ICD-10-CM

## 2018-11-22 ENCOUNTER — Other Ambulatory Visit: Payer: Self-pay

## 2018-11-22 ENCOUNTER — Ambulatory Visit
Admission: RE | Admit: 2018-11-22 | Discharge: 2018-11-22 | Disposition: A | Discharge status: Home / Self Care | Payer: PPO | Source: Ambulatory Visit | Attending: Adult Health | Admitting: Adult Health

## 2018-11-22 DIAGNOSIS — Z1231 Encounter for screening mammogram for malignant neoplasm of breast: Secondary | ICD-10-CM | POA: Diagnosis not present

## 2018-11-23 ENCOUNTER — Other Ambulatory Visit: Payer: Self-pay | Admitting: Adult Health

## 2018-11-23 DIAGNOSIS — R921 Mammographic calcification found on diagnostic imaging of breast: Secondary | ICD-10-CM

## 2018-11-25 ENCOUNTER — Other Ambulatory Visit: Payer: Self-pay

## 2018-11-25 ENCOUNTER — Ambulatory Visit
Admission: RE | Admit: 2018-11-25 | Discharge: 2018-11-25 | Disposition: A | Discharge status: Home / Self Care | Payer: PPO | Source: Ambulatory Visit | Attending: Adult Health | Admitting: Adult Health

## 2018-11-25 ENCOUNTER — Other Ambulatory Visit: Payer: Self-pay | Admitting: Adult Health

## 2018-11-25 DIAGNOSIS — R921 Mammographic calcification found on diagnostic imaging of breast: Secondary | ICD-10-CM

## 2018-11-25 DIAGNOSIS — R928 Other abnormal and inconclusive findings on diagnostic imaging of breast: Secondary | ICD-10-CM | POA: Diagnosis not present

## 2018-11-28 ENCOUNTER — Other Ambulatory Visit: Payer: Self-pay | Admitting: Adult Health

## 2018-11-29 ENCOUNTER — Ambulatory Visit
Admission: RE | Admit: 2018-11-29 | Discharge: 2018-11-29 | Disposition: A | Discharge status: Home / Self Care | Payer: PPO | Source: Ambulatory Visit | Attending: Adult Health | Admitting: Adult Health

## 2018-11-29 ENCOUNTER — Other Ambulatory Visit: Payer: Self-pay

## 2018-11-29 DIAGNOSIS — R921 Mammographic calcification found on diagnostic imaging of breast: Secondary | ICD-10-CM

## 2018-11-29 DIAGNOSIS — N6012 Diffuse cystic mastopathy of left breast: Secondary | ICD-10-CM | POA: Diagnosis not present

## 2018-12-12 DIAGNOSIS — H04202 Unspecified epiphora, left lacrimal gland: Secondary | ICD-10-CM | POA: Diagnosis not present

## 2018-12-21 ENCOUNTER — Ambulatory Visit: Payer: Self-pay | Admitting: Internal Medicine

## 2018-12-21 NOTE — Progress Notes (Signed)
                                                                                                                                                                                                  R  E  S  C  H  E  D  U L  E  D                        This very nice 68 y.o.  MWF presents for 6 month follow up with HTN, HLD, Pre-Diabetes and Vitamin D Deficiency.       Patient is treated for HTN & BP has been controlled at home. Today's  . Patient has had no complaints of any cardiac type chest pain, palpitations, dyspnea / orthopnea / PND, dizziness, claudication, or dependent edema.  Hyperlipidemia is controlled with diet & meds. Patient denies myalgias or other med SE's. Last Lipids were  Lab Results  Component Value Date   CHOL 286 (H) 06/07/2018   HDL 119 06/07/2018   LDLCALC 148 (H) 06/07/2018   LDLDIRECT 73.3 04/30/2011   TRIG 85 06/07/2018   CHOLHDL 2.4 06/07/2018    Also, the patient has history of PreDiabetes and has had no symptoms of reactive hypoglycemia, diabetic polys, paresthesias or visual blurring.  Last A1c was  Lab Results  Component Value Date   HGBA1C 5.2 06/07/2018       Further, the patient also has history of Vitamin D Deficiency and supplements vitamin D without any suspected side-effects. Last vitamin D was Lab Results  Component Value Date   VD25OH 74 06/07/2018

## 2018-12-27 ENCOUNTER — Ambulatory Visit (INDEPENDENT_AMBULATORY_CARE_PROVIDER_SITE_OTHER): Payer: PPO | Admitting: Adult Health Nurse Practitioner

## 2018-12-27 ENCOUNTER — Ambulatory Visit: Payer: Self-pay | Admitting: Internal Medicine

## 2018-12-27 ENCOUNTER — Encounter: Payer: Self-pay | Admitting: Adult Health Nurse Practitioner

## 2018-12-27 ENCOUNTER — Other Ambulatory Visit: Payer: Self-pay | Admitting: Adult Health Nurse Practitioner

## 2018-12-27 ENCOUNTER — Other Ambulatory Visit: Payer: Self-pay

## 2018-12-27 VITALS — BP 118/72 | Temp 97.4°F | Wt 130.0 lb

## 2018-12-27 DIAGNOSIS — M839 Adult osteomalacia, unspecified: Secondary | ICD-10-CM | POA: Diagnosis not present

## 2018-12-27 DIAGNOSIS — R7309 Other abnormal glucose: Secondary | ICD-10-CM | POA: Diagnosis not present

## 2018-12-27 DIAGNOSIS — I10 Essential (primary) hypertension: Secondary | ICD-10-CM

## 2018-12-27 DIAGNOSIS — I350 Nonrheumatic aortic (valve) stenosis: Secondary | ICD-10-CM | POA: Diagnosis not present

## 2018-12-27 DIAGNOSIS — E782 Mixed hyperlipidemia: Secondary | ICD-10-CM | POA: Diagnosis not present

## 2018-12-27 DIAGNOSIS — M85852 Other specified disorders of bone density and structure, left thigh: Secondary | ICD-10-CM | POA: Diagnosis not present

## 2018-12-27 DIAGNOSIS — Z6822 Body mass index (BMI) 22.0-22.9, adult: Secondary | ICD-10-CM

## 2018-12-27 DIAGNOSIS — J302 Other seasonal allergic rhinitis: Secondary | ICD-10-CM | POA: Diagnosis not present

## 2018-12-27 NOTE — Progress Notes (Signed)
3 Month Follow Up   Assessment and Plan:   Kathryn Chavez was seen today for follow-up.  Diagnoses and all orders for this visit:  Mixed hyperlipidemia Cholesterol Continue medications: Crestor 40mg  Continue low cholesterol diet and exercise.  Check lipid panel.  -     Lipid panel  Essential hypertension No medications at this time, continue to monitor -     CBC with Differential/Platelet -     COMPLETE METABOLIC PANEL WITH GFR  Aortic stenosis, moderate Monitor lipids, sugars and blood pressure.  Seasonal allergic rhinitis, unspecified trigger Doing well at this time Using Flonase 2 sprays PRN  Vitamin D deficient osteomalacia Continue supplementation -     VITAMIN D 25 Hydroxy (Vit-D Deficiency)  Osteopenia of neck of left femur Discussed taking calcium supplement, not currently taking and continue Vitamin D DEXA, UTD  Abnormal glucose Discussed dietary and exercise modifications -     Hemoglobin A1c  BMI 22.0-22.9, adult Discussed dietary and exercise modifications    Patient agrees with plan of care.  Follow up in 3 months. Continue diet and meds as discussed. Further disposition pending results of labs. Discussed med's effects and SE's.  Patient agrees with plan of care and opportunity to ask questions/voice concerns. Over 30 minutes of chart review, interview, exam, counseling, and critical decision making was performed.   Future Appointments  Date Time Provider [place]  06/27/2019  9:00 AM Unk Pinto, MD [facility] None  10/02/2019  9:00 AM Vicie Mutters, PA-C [facility] None    ----------------------------------------------------------------------------------------------------------------------  HPI 68 y.o. female  presents for 3 month follow up on HTN, HLD, history of pre-diabetes, weight, depression, seasonal allergies and vitamin D deficiency.   BMI is Body mass index is 21.3 kg/m., she has been working on diet and exercise. Wt  Readings from Last 3 Encounters:  12/27/18 130 lb (59 kg)  09/20/18 126 lb 12.8 oz (57.5 kg)  06/07/18 130 lb 3.2 oz (59.1 kg)    Kathryn Chavez blood pressure has been controlled at home, today their BP is BP: 118/72  She does workout. She denies any cardiac symptoms, chest pains, palpitations, shortness of breath, dizziness or lower extremity edema.     She is on cholesterol medication Rosuvastatin and denies myalgias. Kathryn Chavez cholesterol is not at goal. The cholesterol last visit was:   Lab Results  Component Value Date   CHOL 200 (H) 12/27/2018   HDL 123 12/27/2018   LDLCALC 65 12/27/2018   LDLDIRECT 73.3 04/30/2011   TRIG 41 12/27/2018   CHOLHDL 1.6 12/27/2018    She has been working on diet and exercise for prediabetes, and denies nausea, paresthesia of the feet, polydipsia, polyuria, visual disturbances, vomiting and weight loss. Last A1C in the office was:  Lab Results  Component Value Date   HGBA1C 5.2 12/27/2018   Patient is on Vitamin D supplement.   Lab Results  Component Value Date   VD25OH 90 12/27/2018       Current Medications:  Current Outpatient Medications on File Prior to Visit  Medication Sig  . ALPRAZolam (XANAX) 0.5 MG tablet Take 1/2-1 tablet 1 - 2  x /day ONLY if needed for Anxiety Attack or Sleep &  limit to 5 days /week to avoid addiction  . aspirin EC 81 MG tablet Take 81 mg by mouth daily.  Marland Kitchen buPROPion (WELLBUTRIN XL) 150 MG 24 hr tablet TAKE 1 TABLET EACH DAY.  . fexofenadine (ALLEGRA) 60 MG tablet Take 60 mg by mouth 2 (two) times daily.  Marland Kitchen  fluticasone (FLONASE) 50 MCG/ACT nasal spray Place 2 sprays into both nostrils daily.  Marland Kitchen gabapentin (NEURONTIN) 100 MG capsule TAKE (1) CAPSULE THREE TIMES DAILY.  . Misc. Devices (VAGINAL CREAM APPLICATOR) MISC Patient is to apply pea size amount to vaginal area 2-3 times a week.  . rosuvastatin (CRESTOR) 40 MG tablet Take 1 tablet Daily for Cholesterol   No current facility-administered medications on file prior to  visit.     Allergies:  Allergies  Allergen Reactions  . Penicillamine Hives  . Penicillins     REACTION: Hives  . Sulfamethoxazole Hives  . Sulfonamide Derivatives     REACTION: Hives     Medical History:  Past Medical History:  Diagnosis Date  . Allergy    rhinitis  . Aortic stenosis, moderate    on echo in 2015.  Marland Kitchen Cancer ([place])    breast  . Hyperlipidemia   . Hypertension   . Personal history of chemotherapy 2000  . Personal history of radiation therapy 2000    Family history- Reviewed and unchanged   Social history- Reviewed and unchanged   Names of Other Physician/Practitioners you currently use: 1. [facility] here for primary care 2. Eye Exam: 10/2018 3. Dental Exam : 08/2018, Q79months Dermatologist: Q38months  Patient Care Team: Unk Pinto, MD as PCP - General (Internal Medicine) Josue Hector, MD as PCP - Cardiology (Cardiology) [place], Heather, OD [facility])   Screening Tests: Immunization History  Administered Date(s) Administered  . Influenza Split 02/26/2011, 03/11/2012  . Influenza, High Dose Seasonal PF 01/29/2016  . Influenza,inj,Quad PF,6+ Mos 05/02/2013  . Pneumococcal Conjugate-13 08/05/2017  . Pneumococcal Polysaccharide-23 01/16/2015  . Tdap 10/28/2010     Vaccinations: TD or Tdap: 2012  Influenza: Due for 2020 Pneumococcal: 2016 Prevnar13: 2019 Shingles: Zostavax/Shingrix: Discussed with patient   Preventative Care: Last colonoscopy: 2013 Last mammogram: 2020, biopsy neg 11/29/18 Last pap smear/pelvic exam: 2010  DEXA: 2019 Hep C screening [id number]): DUE?  Imaging: Chest X-ray: - EKG: 05/2018 ECHO: 2019    Review of Systems:  Review of Systems  Constitutional: Negative for chills, diaphoresis, fever, malaise/fatigue and weight loss.  HENT: Negative for congestion, ear discharge, ear pain, hearing loss, nosebleeds, sinus pain, sore throat and tinnitus.   Eyes: Negative  for blurred vision, double vision, photophobia, pain, discharge and redness.  Respiratory: Negative for cough, hemoptysis, sputum production, shortness of breath, wheezing and stridor.   Cardiovascular: Negative for chest pain, palpitations, orthopnea, claudication, leg swelling and PND.  Gastrointestinal: Negative for abdominal pain, blood in stool, constipation, diarrhea, heartburn, melena, nausea and vomiting.  Genitourinary: Negative for dysuria, flank pain, frequency, hematuria and urgency.  Musculoskeletal: Negative for back pain, falls, joint pain, myalgias and neck pain.  Skin: Negative for itching and rash.  Neurological: Negative for dizziness, tingling, tremors, sensory change, speech change, focal weakness, seizures, loss of consciousness, weakness and headaches.  Endo/Heme/Allergies: Negative for environmental allergies and polydipsia. Does not bruise/bleed easily.  Psychiatric/Behavioral: Negative for depression, hallucinations, memory loss, substance abuse and suicidal ideas. The patient is not nervous/anxious and does not have insomnia.       Physical Exam: BP 118/72   Temp (!) 97.4 F (36.3 C)   Wt 130 lb (59 kg)   BMI 21.30 kg/m  Wt Readings from Last 3 Encounters:  12/27/18 130 lb (59 kg)  09/20/18 126 lb 12.8 oz (57.5 kg)  06/07/18 130 lb 3.2 oz (59.1 kg)   General Appearance: Well nourished, in no apparent  distress. Eyes: PERRLA, EOMs, conjunctiva no swelling or erythema Sinuses: No Frontal/maxillary tenderness ENT/Mouth: Ext aud canals clear, TMs without erythema, bulging. No erythema, swelling, or exudate on post pharynx.  Tonsils not swollen or erythematous. Hearing normal.  Neck: Supple, thyroid normal.  Respiratory: Respiratory effort normal, BS equal bilaterally without rales, rhonchi, wheezing or stridor.  Cardio: RRR with no MRGs. Brisk peripheral pulses without edema.  Abdomen: Soft, + BS.  Non tender, no guarding, rebound, hernias, masses. Lymphatics:  Non tender without lymphadenopathy.  Musculoskeletal: Full ROM, 5/5 strength, Normal gait Skin: Warm, dry without rashes, lesions, ecchymosis.  Neuro: Cranial nerves intact. No cerebellar symptoms.  Psych: Awake and oriented X 3, normal affect, Insight and Judgment appropriate.    Garnet Sierras, NP [facility] 4:40 PM

## 2018-12-27 NOTE — Progress Notes (Signed)
       R  E  S  C  H  E  D  U  L  E D                                                                                                                                                                                                                                                       This very nice 68 y.o.  single WF presents for 6 month follow up with labile  HTN, HLD, Pre-Diabetes and Vitamin D Deficiency.       She has hx/o Rt Breast Ca s/p Lumpectomy in 2000 treated with ChemoRadiation & completed 5 yrs Arimidex.      Patient is treated for HTN & BP has been controlled at home. Today's  .  Patient is followed by Dr Johnsie Cancel for Moderate AoS.  Patient has had no complaints of any cardiac type chest pain, palpitations, dyspnea / orthopnea / PND, dizziness, claudication, or dependent edema.      Hyperlipidemia is controlled with diet & meds. Patient denies myalgias or other med SE's. Last Lipids were  Lab Results  Component Value Date   CHOL 286 (H) 06/07/2018   HDL 119 06/07/2018   LDLCALC 148 (H) 06/07/2018   LDLDIRECT 73.3 04/30/2011   TRIG 85 06/07/2018   CHOLHDL 2.4 06/07/2018       Also, the patient has history of PreDiabetes and has had no symptoms of reactive hypoglycemia, diabetic polys, paresthesias or visual blurring.  Last A1c was Normal & at goal: Lab Results  Component Value Date   HGBA1C 5.2 06/07/2018       Further, the patient also has history of Vitamin D Deficiency ("25" / 2016) and supplements vitamin D without any suspected side-effects. Last vitamin D was Lab Results  Component Value Date   VD25OH 74 06/07/2018

## 2018-12-27 NOTE — Patient Instructions (Addendum)
Take a calcium supplement 1,200mg  a day to help with your bone health.  Last bone density scan showed osteopenia, weak bones.  You are already taking Vitamin D so this will help your body to absorb the calcium.   Allergy Symptoms / Runny Nose: Chose one  Zyrtec / Cetirizine Take 10mg  by mouth May cause drowsiness, take nightly Be sure to drink plenty of water If this is not effective, try Xyzal or Allegra  OR  Xyzal / Levocetirazine  Take 5mg  by mouth May cause drowsiness, take nightly Be sure to drink plenty of water If this is not effective try Allegra or Zyrtec  OR  Allegra / fexofenadine Take 180mg  by mouth daily If this is not effective try Zyrtec or Xyzal   *If you battle with chronic allergies you may need to change the antihistamine you currently use to find most effective.  You may continue taking Singular with one of the above antihistamines.      Ask insurance and pharmacy about shingrix - new vaccine   Can go to [email or web address] for more information  Shingrix Vaccination  Two vaccines are licensed and recommended to prevent shingles in the [place] Zoster vaccine live (ZVL, Zostavax) has been in use since 2006. Recombinant zoster vaccine (RZV, Shingrix), has been in use since 2017 and is recommended by [employer] as the preferred shingles vaccine.  What Everyone Should Know about Shingles Vaccine (Shingrix) One of the Recommended Vaccines by Disease Shingles vaccination is the only way to protect against shingles and postherpetic neuralgia (PHN), the most common complication from shingles. CDC recommends that healthy adults 50 years and older get two doses of the shingles vaccine called Shingrix (recombinant zoster vaccine), separated by 2 to 6 months, to prevent shingles and the complications from the disease. Your doctor or pharmacist can give you Shingrix as a shot in your upper arm. Shingrix provides strong  protection against shingles and PHN. Two doses of Shingrix is more than 90% effective at preventing shingles and PHN. Protection stays above 85% for at least the first four years after you get vaccinated. Shingrix is the preferred vaccine, over Zostavax (zoster vaccine live), a shingles vaccine in use since 2006. Zostavax may still be used to prevent shingles in healthy adults 60 years and older. For example, you could use Zostavax if a person is allergic to Shingrix, prefers Zostavax, or requests immediate vaccination and Shingrix is unavailable. Who Should Get Shingrix? Healthy adults 50 years and older should get two doses of Shingrix, separated by 2 to 6 months. You should get Shingrix even if in the past you  had shingles   received Zostavax   are not sure if you had chickenpox There is no maximum age for getting Shingrix. If you had shingles in the past, you can get Shingrix to help prevent future occurrences of the disease. There is no specific length of time that you need to wait after having shingles before you can receive Shingrix, but generally you should make sure the shingles rash has gone away before getting vaccinated. You can get Shingrix whether or not you remember having had chickenpox in the past. Studies show that more than 99% of Americans 40 years and older have had chickenpox, even if they dont remember having the disease. Chickenpox and shingles are related because they are caused by the same virus (varicella zoster virus). After a person recovers from chickenpox, the virus stays dormant (inactive) in the body. It can reactivate years later and cause shingles.  If you had Zostavax in the recent past, you should wait at least eight weeks before getting Shingrix. Talk to your healthcare provider to determine the best time to get Shingrix. Shingrix is available in doctors offices and pharmacies. To find doctors offices or pharmacies near you that offer the vaccine, visit  HealthMap Vaccine FinderExternal. If you have questions about Shingrix, talk with your healthcare provider. Vaccine for Those 14 Years and Older  Shingrix reduces the risk of shingles and PHN by more than 90% in people 57 and older. CDC recommends the vaccine for healthy adults 35 and older.  Who Should Not Get Shingrix? You should not get Shingrix if you:  have ever had a severe allergic reaction to any component of the vaccine or after a dose of Shingrix   tested negative for immunity to varicella zoster virus. If you test negative, you should get chickenpox vaccine.   currently have shingles   currently are pregnant or breastfeeding. Women who are pregnant or breastfeeding should wait to get Shingrix.   receive specific antiviral drugs (acyclovir, famciclovir, or valacyclovir) 24 hours before vaccination (avoid use of these antiviral drugs for 14 days after vaccination)- zoster vaccine live only If you have a minor acute (starts suddenly) illness, such as a cold, you may get Shingrix. But if you have a moderate or severe acute illness, you should usually wait until you recover before getting the vaccine. This includes anyone with a temperature of 101.1F or higher. The side effects of the Shingrix are temporary, and usually last 2 to 3 days. While you may experience pain for a few days after getting Shingrix, the pain will be less severe than having shingles and the complications from the disease. How Well Does Shingrix Work? Two doses of Shingrix provides strong protection against shingles and postherpetic neuralgia (PHN), the most common complication of shingles.  In adults 50 to 69 years old who got two doses, Shingrix was 97% effective in preventing shingles; among adults 70 years and older, Shingrix was 91% effective.   In adults 42 to 69 years old who got two doses, Shingrix was 91% effective in preventing PHN; among adults 70 years and older, Shingrix was 89% effective. Shingrix  protection remained high (more than 85%) in people 70 years and older throughout the four years following vaccination. Since your risk of shingles and PHN increases as you get older, it is important to have strong protection against shingles in your older years. Top of Page  What Are the Possible Side Effects of Shingrix? Studies show that Shingrix is safe. The vaccine helps your body create a strong defense against shingles. As a result, you are likely to have temporary side effects from getting the shots. The side effects may affect your ability to do normal daily activities for 2 to 3 days. Most people got a sore arm with mild or moderate pain after getting Shingrix, and some also had redness and swelling where they got the shot. Some people felt tired, had muscle pain, a headache, shivering, fever, stomach pain, or nausea. About 1 out of 6 people who got Shingrix experienced side effects that prevented them from doing regular activities. Symptoms went away on their own in about 2 to 3 days. Side effects were more common in younger people. You might have a reaction to the first or second dose of Shingrix, or both doses. If you experience side effects, you may choose to take over-the-counter pain medicine such as ibuprofen or acetaminophen. If  you experience side effects from Shingrix, you should report them to the Vaccine Adverse Event Reporting System (VAERS). Your doctor might file this report, or you can do it yourself through the VAERS websiteExternal, or by calling [phone]. If you have any questions about side effects from Shingrix, talk with your doctor. The shingles vaccine does not contain thimerosal (a preservative containing mercury). Top of Page  When Should I See a Doctor Because of the Side Effects I Experience From Shingrix? In clinical trials, Shingrix was not associated with serious adverse events. In fact, serious side effects from vaccines are extremely rare. For example, for  every 1 million doses of a vaccine given, only one or two people may have a severe allergic reaction. Signs of an allergic reaction happen within minutes or hours after vaccination and include hives, swelling of the face and throat, difficulty breathing, a fast heartbeat, dizziness, or weakness. If you experience these or any other life-threatening symptoms, see a doctor right away. Shingrix causes a strong response in your immune system, so it may produce short-term side effects more intense than you are used to from other vaccines. These side effects can be uncomfortable, but they are expected and usually go away on their own in 2 or 3 days. Top of Page  How Can I Pay For Shingrix? There are several ways shingles vaccine may be paid for: Medicare  Medicare Part D plans cover the shingles vaccine, but there may be a cost to you depending on your plan. There may be a copay for the vaccine, or you may need to pay in full then get reimbursed for a certain amount.   Medicare Part B does not cover the shingles vaccine. Medicaid  Medicaid may or may not cover the vaccine. Contact your insurer to find out. Private health insurance  Many private health insurance plans will cover the vaccine, but there may be a cost to you depending on your plan. Contact your insurer to find out. Vaccine assistance programs  Some pharmaceutical companies provide vaccines to eligible adults who cannot afford them. You may want to check with the vaccine manufacturer, [employer], about Shingrix. If you do not currently have health insurance, learn more about affordable health coverage optionsExternal. To find doctors offices or pharmacies near you that offer the vaccine, visit HealthMap Vaccine FinderExternal.

## 2018-12-28 LAB — CBC WITH DIFFERENTIAL/PLATELET
Absolute Monocytes: 462 cells/uL (ref 200–950)
Basophils Absolute: 42 cells/uL (ref 0–200)
Basophils Relative: 0.7 %
Eosinophils Absolute: 108 cells/uL (ref 15–500)
Eosinophils Relative: 1.8 %
HCT: 37.7 % (ref 35.0–45.0)
Hemoglobin: 12.6 g/dL (ref 11.7–15.5)
Lymphs Abs: 2076 cells/uL (ref 850–3900)
MCH: 31.7 pg (ref 27.0–33.0)
MCHC: 33.4 g/dL (ref 32.0–36.0)
MCV: 94.7 fL (ref 80.0–100.0)
MPV: 9.8 fL (ref 7.5–12.5)
Monocytes Relative: 7.7 %
Neutro Abs: 3312 cells/uL (ref 1500–7800)
Neutrophils Relative %: 55.2 %
Platelets: 154 10*3/uL (ref 140–400)
RBC: 3.98 10*6/uL (ref 3.80–5.10)
RDW: 12 % (ref 11.0–15.0)
Total Lymphocyte: 34.6 %
WBC: 6 10*3/uL (ref 3.8–10.8)

## 2018-12-28 LAB — LIPID PANEL
Cholesterol: 200 mg/dL — ABNORMAL HIGH (ref <200)
HDL: 123 mg/dL (ref >=50)
LDL Cholesterol (Calc): 65 mg/dL (calc)
Non-HDL Cholesterol (Calc): 77 mg/dL (calc) (ref <130)
Total CHOL/HDL Ratio: 1.6 (calc) (ref <5.0)
Triglycerides: 41 mg/dL (ref <150)

## 2018-12-28 LAB — COMPLETE METABOLIC PANEL WITH GFR
AG Ratio: 2.7 (calc) — ABNORMAL HIGH (ref 1.0–2.5)
ALT: 41 U/L — ABNORMAL HIGH (ref 6–29)
AST: 44 U/L — ABNORMAL HIGH (ref 10–35)
Albumin: 4.6 g/dL (ref 3.6–5.1)
Alkaline phosphatase (APISO): 96 U/L (ref 37–153)
BUN: 12 mg/dL (ref 7–25)
CO2: 23 mmol/L (ref 20–32)
Calcium: 9.8 mg/dL (ref 8.6–10.4)
Chloride: 100 mmol/L (ref 98–110)
Creat: 0.73 mg/dL (ref 0.50–0.99)
GFR, Est African American: 97 mL/min/{1.73_m2} (ref >=60)
GFR, Est Non African American: 84 mL/min/{1.73_m2} (ref >=60)
Globulin: 1.7 g/dL (calc) — ABNORMAL LOW (ref 1.9–3.7)
Glucose, Bld: 77 mg/dL (ref 65–99)
Potassium: 3.9 mmol/L (ref 3.5–5.3)
Sodium: 137 mmol/L (ref 135–146)
Total Bilirubin: 0.7 mg/dL (ref 0.2–1.2)
Total Protein: 6.3 g/dL (ref 6.1–8.1)

## 2018-12-28 LAB — HEMOGLOBIN A1C
Hgb A1c MFr Bld: 5.2 % of total Hgb (ref <5.7)
Mean Plasma Glucose: 103 (calc)
eAG (mmol/L): 5.7 (calc)

## 2018-12-28 LAB — VITAMIN D 25 HYDROXY (VIT D DEFICIENCY, FRACTURES): Vit D, 25-Hydroxy: 90 ng/mL (ref 30–100)

## 2019-01-01 ENCOUNTER — Encounter: Payer: Self-pay | Admitting: Adult Health Nurse Practitioner

## 2019-01-12 NOTE — Progress Notes (Signed)
01/12/2019-Patient is aware of lab results. Kathryn Chavez

## 2019-01-26 ENCOUNTER — Telehealth: Payer: Self-pay | Admitting: *Deleted

## 2019-01-26 ENCOUNTER — Other Ambulatory Visit: Payer: Self-pay | Admitting: Internal Medicine

## 2019-01-26 DIAGNOSIS — F418 Other specified anxiety disorders: Secondary | ICD-10-CM

## 2019-01-26 MED ORDER — BUPROPION HCL ER (XL) 300 MG PO TB24: ORAL_TABLET | ORAL | 3 refills | Status: DC

## 2019-01-26 NOTE — Telephone Encounter (Signed)
Patient called and requested an increase in her Wellbutrin 150 mg dose, due to not being effective.  An RX for Wellbutrin 300 mg sent to the patient's pharmacy.  The patient is aware.

## 2019-02-01 ENCOUNTER — Other Ambulatory Visit: Payer: Self-pay

## 2019-02-01 DIAGNOSIS — Z20822 Contact with and (suspected) exposure to covid-19: Secondary | ICD-10-CM

## 2019-02-01 DIAGNOSIS — Z20828 Contact with and (suspected) exposure to other viral communicable diseases: Secondary | ICD-10-CM | POA: Diagnosis not present

## 2019-02-02 LAB — NOVEL CORONAVIRUS, NAA: SARS-CoV-2, NAA: NOT DETECTED

## 2019-02-09 DIAGNOSIS — L821 Other seborrheic keratosis: Secondary | ICD-10-CM | POA: Diagnosis not present

## 2019-02-09 DIAGNOSIS — Z85828 Personal history of other malignant neoplasm of skin: Secondary | ICD-10-CM | POA: Diagnosis not present

## 2019-02-09 DIAGNOSIS — D225 Melanocytic nevi of trunk: Secondary | ICD-10-CM | POA: Diagnosis not present

## 2019-02-09 DIAGNOSIS — L570 Actinic keratosis: Secondary | ICD-10-CM | POA: Diagnosis not present

## 2019-02-09 DIAGNOSIS — L814 Other melanin hyperpigmentation: Secondary | ICD-10-CM | POA: Diagnosis not present

## 2019-03-27 ENCOUNTER — Encounter: Payer: Self-pay | Admitting: Internal Medicine

## 2019-03-27 NOTE — Patient Instructions (Signed)

## 2019-03-27 NOTE — Progress Notes (Signed)
History of Present Illness:      This very nice 68 y.o. MWF  presents for 3 month follow up with HTN, HLD, Pre-Diabetes and Vitamin D Deficiency.       Patient is followed expectantly for labile  HTN & BP has been controlled at home. Todays BP is at goal - 136/84.  Patient also has hx/o Moderate AoS & is followed by Dr Johnsie Cancel. Patient has had no complaints of any cardiac type chest pain, palpitations, dyspnea / orthopnea / PND, dizziness, claudication, or dependent edema.      Hyperlipidemia is controlled with diet & meds. Patient denies myalgias or other med SEs. Last Lipids were at goal with a very high HDL 123 (!) and low LDL 65:  Lab Results  Component Value Date   CHOL 200 (H) 12/27/2018   HDL 123 12/27/2018   LDLCALC 65 12/27/2018   LDLDIRECT 73.3 04/30/2011   TRIG 41 12/27/2018   CHOLHDL 1.6 12/27/2018        Also, the patient is monitored expectantly for glucose intolerance  and has had no symptoms of reactive hypoglycemia, diabetic polys, paresthesias or visual blurring.  Last A1c was Normal & at goal:  Lab Results  Component Value Date   HGBA1C 5.2 12/27/2018        Further, the patient also has history of Vitamin D Deficiency ("25" / 2016) and supplements vitamin D without any suspected side-effects. Last vitamin D was at goal:  Lab Results  Component Value Date   VD25OH 90 12/27/2018    Current Outpatient Medications on File Prior to Visit  Medication Sig   ALPRAZolam (XANAX) 0.5 MG tablet Take 1/2-1 tablet 1 - 2  x /day ONLY if needed for Anxiety Attack or Sleep &  limit to 5 days /week to avoid addiction   buPROPion (WELLBUTRIN XL) 300 MG 24 hr tablet Take 1 tablet every morning for Mood, Anxiety, Focus & Concentration   fexofenadine (ALLEGRA) 60 MG tablet Take 60 mg by mouth 2 (two) times daily.   fluticasone (FLONASE) 50 MCG/ACT nasal spray Place 2 sprays into both nostrils daily.   gabapentin (NEURONTIN) 100 MG capsule TAKE (1) CAPSULE THREE  TIMES DAILY.   Misc. Devices (VAGINAL CREAM APPLICATOR) MISC Patient is to apply pea size amount to vaginal area 2-3 times a week.   rosuvastatin (CRESTOR) 40 MG tablet Take 1 tablet Daily for Cholesterol   aspirin EC 81 MG tablet Take 81 mg by mouth daily.   No current facility-administered medications on file prior to visit.     Allergies  Allergen Reactions   Influenza Vaccines Nausea And Vomiting and Other (See Comments)    dizziness   Penicillamine Hives   Penicillins     REACTION: Hives   Sulfamethoxazole Hives   Sulfonamide Derivatives     REACTION: Hives    PMHx:   Past Medical History:  Diagnosis Date   Allergy    rhinitis   Aortic stenosis, moderate    on echo in 2015.   Cancer [facility])    breast   Hyperlipidemia    Hypertension    Personal history of chemotherapy 2000   Personal history of radiation therapy 2000    Immunization History  Administered Date(s) Administered   Influenza Split 02/26/2011, 03/11/2012   Influenza, High Dose Seasonal PF 01/29/2016   Influenza,inj,Quad PF,6+ Mos 05/02/2013   Pneumococcal Conjugate-13 08/05/2017   Pneumococcal Polysaccharide-23 01/16/2015   Tdap 10/28/2010    Past  Surgical History:  Procedure Laterality Date   ABDOMINAL HYSTERECTOMY     BREAST LUMPECTOMY Right 2000   malignant   CYSTOCELE REPAIR N/A 05/12/2018   FHx:    Reviewed / unchanged  SHx:    Reviewed / unchanged   Systems Review:  Constitutional: Denies fever, chills, wt changes, headaches, insomnia, fatigue, night sweats, change in appetite. Eyes: Denies redness, blurred vision, diplopia, discharge, itchy, watery eyes.  ENT: Denies discharge, congestion, post nasal drip, epistaxis, sore throat, earache, hearing loss, dental pain, tinnitus, vertigo, sinus pain, snoring.  CV: Denies chest pain, palpitations, irregular heartbeat, syncope, dyspnea, diaphoresis, orthopnea, PND, claudication or edema. Respiratory: denies cough,  dyspnea, DOE, pleurisy, hoarseness, laryngitis, wheezing.  Gastrointestinal: Denies dysphagia, odynophagia, heartburn, reflux, water brash, abdominal pain or cramps, nausea, vomiting, bloating, diarrhea, constipation, hematemesis, melena, hematochezia  or hemorrhoids. Genitourinary: Denies dysuria, frequency, urgency, nocturia, hesitancy, discharge, hematuria or flank pain. Musculoskeletal: Denies arthralgias, myalgias, stiffness, jt. swelling, pain, limping or strain/sprain.  Skin: Denies pruritus, rash, hives, warts, acne, eczema or change in skin lesion(s). Neuro: No weakness, tremor, incoordination, spasms, paresthesia or pain. Psychiatric: Denies confusion, memory loss or sensory loss. Endo: Denies change in weight, skin or hair change.  Heme/Lymph: No excessive bleeding, bruising or enlarged lymph nodes.  Physical Exam  BP 136/84    Pulse 64    Temp (!) 97.1 F (36.2 C)    Resp 16    Ht 5' 5.5" (1.664 m)    Wt 130 lb 12.8 oz (59.3 kg)    BMI 21.44 kg/m   Appears  well nourished, well groomed  and in no distress.  Eyes: PERRLA, EOMs, conjunctiva no swelling or erythema. Sinuses: No frontal/maxillary tenderness ENT/Mouth: EAC's clear, TM's nl w/o erythema, bulging. Nares clear w/o erythema, swelling, exudates. Oropharynx clear without erythema or exudates. Oral hygiene is good. Tongue normal, non obstructing. Hearing intact.  Neck: Supple. Thyroid not palpable. Car 2+/2+ without bruits, nodes or JVD. Chest: Respirations nl with BS clear & equal w/o rales, rhonchi, wheezing or stridor.  Cor: Heart sounds normal w/ regular rate and rhythm w/ Gr 2/4 AoS murmur. Peripheral pulses normal and equal  without edema.  Abdomen: Soft & bowel sounds normal. Non-tender w/o guarding, rebound, hernias, masses or organomegaly.  Lymphatics: Unremarkable.  Musculoskeletal: Full ROM all peripheral extremities, joint stability, 5/5 strength and normal gait.  Skin: Warm, dry without exposed rashes, lesions  or ecchymosis apparent.  Neuro: Cranial nerves intact, reflexes equal bilaterally. Sensory-motor testing grossly intact. Tendon reflexes grossly intact.  Pysch: Alert & oriented x 3.  Insight and judgement nl & appropriate. No ideations.  Assessment and Plan:  1. Labile hypertension  - Continue medication, monitor blood pressure at home.  - Continue DASH diet.  Reminder to go to the ER if any CP,  SOB, nausea, dizziness, severe HA, changes vision/speech.  - CBC with Diff - COMPLETE METABOLIC PANEL WITH GFR - Magnesium - TSH  2. Hyperlipidemia, mixed  - Continue diet/meds, exercise,& lifestyle modifications.  - Continue monitor periodic cholesterol/liver & renal functions   - Lipid Profile - TSH  3. Abnormal glucose  - Continue diet, exercise  - Lifestyle modifications.  - Monitor appropriate labs.  - Hemoglobin A1c ([facility]) - Insulin, random  4. Vitamin D deficiency  - Continue supplementation.  - Vitamin D (25 hydroxy)  5. Aortic stenosis, moderate  6. Medication management  - CBC with Diff - COMPLETE METABOLIC PANEL WITH GFR - Magnesium - Lipid Profile - TSH - Hemoglobin  A1c ([facility]) - Insulin, random - Vitamin D (25 hydroxy)         Discussed  regular exercise, BP monitoring, weight control to achieve/maintain BMI less than 25 and discussed med and SE's. Recommended labs to assess and monitor clinical status with further disposition pending results of labs.  I discussed the assessment and treatment plan with the patient. The patient was provided an opportunity to ask questions and all were answered. The patient agreed with the plan and demonstrated an understanding of the instructions.  I provided over 30 minutes of exam, counseling, chart review and  complex critical decision making.  Kirtland Bouchard, MD

## 2019-03-28 ENCOUNTER — Ambulatory Visit (INDEPENDENT_AMBULATORY_CARE_PROVIDER_SITE_OTHER): Payer: PPO | Admitting: Internal Medicine

## 2019-03-28 ENCOUNTER — Other Ambulatory Visit: Payer: Self-pay

## 2019-03-28 VITALS — BP 136/84 | HR 64 | Temp 97.1°F | Resp 16 | Ht 65.5 in | Wt 130.8 lb

## 2019-03-28 DIAGNOSIS — E559 Vitamin D deficiency, unspecified: Secondary | ICD-10-CM | POA: Diagnosis not present

## 2019-03-28 DIAGNOSIS — I350 Nonrheumatic aortic (valve) stenosis: Secondary | ICD-10-CM | POA: Diagnosis not present

## 2019-03-28 DIAGNOSIS — Z79899 Other long term (current) drug therapy: Secondary | ICD-10-CM | POA: Diagnosis not present

## 2019-03-28 DIAGNOSIS — R0989 Other specified symptoms and signs involving the circulatory and respiratory systems: Secondary | ICD-10-CM

## 2019-03-28 DIAGNOSIS — R7309 Other abnormal glucose: Secondary | ICD-10-CM | POA: Diagnosis not present

## 2019-03-28 DIAGNOSIS — E782 Mixed hyperlipidemia: Secondary | ICD-10-CM | POA: Diagnosis not present

## 2019-03-29 LAB — COMPLETE METABOLIC PANEL WITH GFR
AG Ratio: 2.4 (calc) (ref 1.0–2.5)
ALT: 26 U/L (ref 6–29)
AST: 34 U/L (ref 10–35)
Albumin: 4.4 g/dL (ref 3.6–5.1)
Alkaline phosphatase (APISO): 94 U/L (ref 37–153)
BUN: 21 mg/dL (ref 7–25)
CO2: 27 mmol/L (ref 20–32)
Calcium: 9.4 mg/dL (ref 8.6–10.4)
Chloride: 104 mmol/L (ref 98–110)
Creat: 0.84 mg/dL (ref 0.50–0.99)
GFR, Est African American: 82 mL/min/{1.73_m2} (ref >=60)
GFR, Est Non African American: 71 mL/min/{1.73_m2} (ref >=60)
Globulin: 1.8 g/dL (calc) — ABNORMAL LOW (ref 1.9–3.7)
Glucose, Bld: 83 mg/dL (ref 65–99)
Potassium: 4.1 mmol/L (ref 3.5–5.3)
Sodium: 141 mmol/L (ref 135–146)
Total Bilirubin: 0.4 mg/dL (ref 0.2–1.2)
Total Protein: 6.2 g/dL (ref 6.1–8.1)

## 2019-03-29 LAB — CBC WITH DIFFERENTIAL/PLATELET
Absolute Monocytes: 509 cells/uL (ref 200–950)
Basophils Absolute: 38 cells/uL (ref 0–200)
Basophils Relative: 0.8 %
Eosinophils Absolute: 110 cells/uL (ref 15–500)
Eosinophils Relative: 2.3 %
HCT: 38.2 % (ref 35.0–45.0)
Hemoglobin: 12.9 g/dL (ref 11.7–15.5)
Lymphs Abs: 1838 cells/uL (ref 850–3900)
MCH: 32.7 pg (ref 27.0–33.0)
MCHC: 33.8 g/dL (ref 32.0–36.0)
MCV: 96.7 fL (ref 80.0–100.0)
MPV: 10.3 fL (ref 7.5–12.5)
Monocytes Relative: 10.6 %
Neutro Abs: 2304 cells/uL (ref 1500–7800)
Neutrophils Relative %: 48 %
Platelets: 157 10*3/uL (ref 140–400)
RBC: 3.95 10*6/uL (ref 3.80–5.10)
RDW: 12.1 % (ref 11.0–15.0)
Total Lymphocyte: 38.3 %
WBC: 4.8 10*3/uL (ref 3.8–10.8)

## 2019-03-29 LAB — LIPID PANEL
Cholesterol: 205 mg/dL — ABNORMAL HIGH (ref <200)
HDL: 115 mg/dL (ref >=50)
LDL Cholesterol (Calc): 77 mg/dL (calc)
Non-HDL Cholesterol (Calc): 90 mg/dL (calc) (ref <130)
Total CHOL/HDL Ratio: 1.8 (calc) (ref <5.0)
Triglycerides: 57 mg/dL (ref <150)

## 2019-03-29 LAB — VITAMIN D 25 HYDROXY (VIT D DEFICIENCY, FRACTURES): Vit D, 25-Hydroxy: 89 ng/mL (ref 30–100)

## 2019-03-29 LAB — HEMOGLOBIN A1C
Hgb A1c MFr Bld: 5.2 % of total Hgb (ref <5.7)
Mean Plasma Glucose: 103 (calc)
eAG (mmol/L): 5.7 (calc)

## 2019-03-29 LAB — TSH: TSH: 4.67 mIU/L — ABNORMAL HIGH (ref 0.40–4.50)

## 2019-03-29 LAB — INSULIN, RANDOM: Insulin: 3.6 u[IU]/mL

## 2019-03-29 LAB — MAGNESIUM: Magnesium: 1.9 mg/dL (ref 1.5–2.5)

## 2019-04-06 ENCOUNTER — Other Ambulatory Visit: Payer: Self-pay | Admitting: Internal Medicine

## 2019-04-06 MED ORDER — GABAPENTIN 100 MG PO CAPS: ORAL_CAPSULE | ORAL | 3 refills | Status: DC

## 2019-04-17 ENCOUNTER — Other Ambulatory Visit: Payer: Self-pay | Admitting: Internal Medicine

## 2019-04-17 MED ORDER — ROSUVASTATIN CALCIUM 5 MG PO TABS: ORAL_TABLET | ORAL | 3 refills | Status: DC

## 2019-05-10 ENCOUNTER — Telehealth: Payer: Self-pay | Admitting: *Deleted

## 2019-05-10 NOTE — Telephone Encounter (Signed)
Patient called with concerns about getting the Covid 19 vaccine. She had a reaction to a flu vaccine in 01-2016, of dizziness, nausea and vomiting. She has not received any further flu vaccine. Per Dr Melford Aase and Vicie Mutters, the patient was advised the vaccine is not prepared in eggs and Dr Melford Aase advised he does not think she will adverse effects.  Per Vicie Mutters, she was advised she will be monitored for at least 30 minutes, due to her previous reaction. She was also advised, she may want to wait until more options of vaccines are available.  Patient is aware.

## 2019-06-07 ENCOUNTER — Other Ambulatory Visit: Payer: Self-pay | Admitting: Internal Medicine

## 2019-06-26 ENCOUNTER — Encounter: Payer: Self-pay | Admitting: Internal Medicine

## 2019-06-26 NOTE — Progress Notes (Signed)
Annual Screening/Preventative Visit & Comprehensive Evaluation &  Examination     This very nice 69 y.o. DWF presents for a Screening /Preventative Visit & comprehensive evaluation and management of multiple medical co-morbidities.  Patient has been followed for HTN, HLD, Prediabetes  and Vitamin D Deficiency. In 2000, patient was treated with a Rt lumpectomy for Breast Ca, then Chemoradiation and followed with Arimidex x 5 years.       Labile HTN predates since 1990's.  Patient has hx/o moderate AoS followed by Dr Jenkins [place]. Patient's BP has been controlled at home and patient denies any cardiac symptoms as chest pain, palpitations, shortness of breath, dizziness or ankle swelling. Today's BP is at goal - 132/75.      Patient's hyperlipidemia is controlled with diet and Crestor 5 mg. Patient denies myalgias or other medication SE's. Last lipids were at goal with very high HDL (115) and low LDL (77):  Lab Results  Component Value Date   CHOL 205 (H) 03/28/2019   HDL 115 03/28/2019   LDLCALC 77 03/28/2019   TRIG 57 03/28/2019   CHOLHDL 1.8 03/28/2019       Patient hais monitored expectantly for glucose intolerance  and patient denies reactive hypoglycemic symptoms, visual blurring, diabetic polys or paresthesias. Last A1c was Normal & at goal:  Lab Results  Component Value Date   HGBA1C 5.1 06/27/2019       Finally, patient has history of Vitamin D Deficiency ("25" / 2016)  and last Vitamin D was at goal:  Lab Results  Component Value Date   VD25OH 69 06/27/2019    Current Outpatient Medications on File Prior to Visit  Medication Sig  . ALPRAZolam (XANAX) 0.5 MG tablet Take 1/2-1 tablet 1 to 2 x /week if needed for Anxiety or Sleep & Limit to 5 days /week  . aspirin EC 81 MG tablet Take 81 mg by mouth daily.  Marland Kitchen buPROPion (WELLBUTRIN XL) 300 MG 24 hr tablet Take 1 tablet every morning for Mood, Anxiety, Focus & Concentration  . fexofenadine (ALLEGRA) 60 MG tablet Take 60 mg by  mouth 2 (two) times daily.  . fluticasone (FLONASE) 50 MCG/ACT nasal spray Place 2 sprays into both nostrils daily.  Marland Kitchen gabapentin (NEURONTIN) 100 MG capsule Take 1 capsule 3 x /day for Pain  . Misc. Devices (VAGINAL CREAM APPLICATOR) MISC Patient is to apply pea size amount to vaginal area 2-3 times a week.  . rosuvastatin (CRESTOR) 5 MG tablet Take 1 tablet Daily for Cholesterol   No current facility-administered medications on file prior to visit.   Allergies  Allergen Reactions  . Influenza Vaccines Nausea And Vomiting and Other (See Comments)    dizziness  . Penicillamine Hives  . Penicillins     REACTION: Hives  . Sulfamethoxazole Hives  . Sulfonamide Derivatives     REACTION: Hives   Past Medical History:  Diagnosis Date  . Allergy    rhinitis  . Aortic stenosis, moderate    on echo in 2015.  Marland Kitchen Cancer ([place])    breast  . Hyperlipidemia   . Hypertension   . Personal history of chemotherapy 2000  . Personal history of radiation therapy 2000   Health Maintenance  Topic Date Due  . Hepatitis C Screening  Never done  . TETANUS/TDAP  10/27/2020  . MAMMOGRAM  11/24/2020  . COLONOSCOPY  03/14/2022  . DEXA SCAN  Completed  . PNA vac Low Risk Adult  Completed  . INFLUENZA VACCINE  Discontinued  Immunization History  Administered Date(s) Administered  . Influenza Split 02/26/2011, 03/11/2012  . Influenza, High Dose Seasonal PF 01/29/2016  . Influenza,inj,Quad PF,6+ Mos 05/02/2013  . Pneumococcal Conjugate-13 08/05/2017  . Pneumococcal Polysaccharide-23 01/16/2015  . Tdap 10/28/2010    Last Colon - 03/14/2012 - Dr Watt Climes recc 10 yr f/u due Nov/Dec 2023  Last MGM - 08.04.2020, 08.07.2020, 08.11.2020  Past Surgical History:  Procedure Laterality Date  . ABDOMINAL HYSTERECTOMY    . BREAST LUMPECTOMY Right 2000   malignant  . CYSTOCELE REPAIR N/A 05/12/2018   Family History  Problem Relation Age of Onset  . Breast cancer Mother 16  . Lung cancer Mother   .  Other Father        BRAIN TUMOR  . Breast cancer Daughter 11  . Arthritis Other   . Hyperlipidemia Other   . Lung cancer Other   . Prostate cancer Other   . Breast cancer Other   . Breast cancer Daughter 87   Social History   Tobacco Use  . Smoking status: Never Smoker  . Smokeless tobacco: Never Used  Substance Use Topics  . Alcohol use: No    Comment: social  . Drug use: No    ROS Constitutional: Denies fever, chills, weight loss/gain, headaches, insomnia,  night sweats, and change in appetite. Does c/o fatigue. Eyes: Denies redness, blurred vision, diplopia, discharge, itchy, watery eyes.  ENT: Denies discharge, congestion, post nasal drip, epistaxis, sore throat, earache, hearing loss, dental pain, Tinnitus, Vertigo, Sinus pain, snoring.  Cardio: Denies chest pain, palpitations, irregular heartbeat, syncope, dyspnea, diaphoresis, orthopnea, PND, claudication, edema Respiratory: denies cough, dyspnea, DOE, pleurisy, hoarseness, laryngitis, wheezing.  Gastrointestinal: Denies dysphagia, heartburn, reflux, water brash, pain, cramps, nausea, vomiting, bloating, diarrhea, constipation, hematemesis, melena, hematochezia, jaundice, hemorrhoids Genitourinary: Denies dysuria, frequency, urgency, nocturia, hesitancy, discharge, hematuria, flank pain Breast: Breast lumps, nipple discharge, bleeding.  Musculoskeletal: Denies arthralgia, myalgia, stiffness, Jt. Swelling, pain, limp, and strain/sprain. Denies falls. Skin: Denies puritis, rash, hives, warts, acne, eczema, changing in skin lesion Neuro: No weakness, tremor, incoordination, spasms, paresthesia, pain Psychiatric: Denies confusion, memory loss, sensory loss. Denies Depression. Endocrine: Denies change in weight, skin, hair change, nocturia, and paresthesia, diabetic polys, visual blurring, hyper / hypo glycemic episodes.  Heme/Lymph: No excessive bleeding, bruising, enlarged lymph nodes.  Physical Exam  BP 132/75   Pulse 68    Temp (!) 97.4 F (36.3 C)   Resp 16   Ht 5' 5.5" (1.664 m)   Wt 128 lb 6.4 oz (58.2 kg)   BMI 21.04 kg/m   General Appearance: Well nourished, well groomed and in no apparent distress.  Eyes: PERRLA, EOMs, conjunctiva no swelling or erythema, normal fundi and vessels. Sinuses: No frontal/maxillary tenderness ENT/Mouth: EACs patent / TMs  nl. Nares clear without erythema, swelling, mucoid exudates. Oral hygiene is good. No erythema, swelling, or exudate. Tongue normal, non-obstructing. Tonsils not swollen or erythematous. Hearing normal.  Neck: Supple, thyroid not palpable. No bruits, nodes or JVD. Respiratory: Respiratory effort normal.  BS equal and clear bilateral without rales, rhonci, wheezing or stridor. Cardio: Heart sounds are normal with regular rate and rhythm and no murmurs, rubs or gallops. Peripheral pulses are normal and equal bilaterally without edema. No aortic or femoral bruits. Chest: symmetric with normal excursions and percussion. Breasts: Symmetric, without lumps, nipple discharge, retractions, or fibrocystic changes.  Abdomen: Flat, soft with bowel sounds active. Nontender, no guarding, rebound, hernias, masses, or organomegaly.  Lymphatics: Non tender without lymphadenopathy.  Genitourinary:  Musculoskeletal: Full ROM all peripheral extremities, joint stability, 5/5 strength, and normal gait. Skin: Warm and dry without rashes, lesions, cyanosis, clubbing or  ecchymosis.  Neuro: Cranial nerves intact, reflexes equal bilaterally. Normal muscle tone, no cerebellar symptoms. Sensation intact.  Pysch: Alert and oriented X 3, normal affect, Insight and Judgment appropriate.   Assessment and Plan  1. Annual Preventative Screening Examination  2. Labile hypertension  - EKG 12-Lead - Urinalysis, Routine w reflex microscopic - Microalbumin / creatinine urine ratio - CBC with Differential/Platelet - COMPLETE METABOLIC PANEL WITH GFR - Magnesium - TSH  3.  Hyperlipidemia, mixed  - EKG 12-Lead - Lipid panel - TSH  4. Abnormal glucose  - EKG 12-Lead - Hemoglobin A1c - Insulin, random  5. Vitamin D deficiency  - VITAMIN D 25 Hydroxy  6. Aortic stenosis, moderate  - EKG 12-Lead  7. Screening for colorectal cancer  - POC Hemoccult Bld/Stl   8. Screening for ischemic heart disease  - EKG 12-Lead  9. Medication management  - Urinalysis, Routine w reflex microscopic - Microalbumin / creatinine urine ratio - CBC with Differential/Platelet - COMPLETE METABOLIC PANEL WITH GFR - Magnesium - Lipid panel - TSH - Hemoglobin A1c - Insulin, random - VITAMIN D 25 Hydroxy         Patient was counseled in prudent diet to achieve/maintain BMI less than 25 for weight control, BP monitoring, regular exercise and medications. Discussed med's effects and SE's. Screening labs and tests as requested with regular follow-up as recommended. Over 40 minutes of exam, counseling, chart review and high complex critical decision making was performed.   Kirtland Bouchard, MD

## 2019-06-26 NOTE — Patient Instructions (Signed)

## 2019-06-27 ENCOUNTER — Ambulatory Visit (INDEPENDENT_AMBULATORY_CARE_PROVIDER_SITE_OTHER): Payer: PPO | Admitting: Internal Medicine

## 2019-06-27 ENCOUNTER — Other Ambulatory Visit: Payer: Self-pay

## 2019-06-27 VITALS — BP 132/75 | HR 68 | Temp 97.4°F | Resp 16 | Ht 65.5 in | Wt 128.4 lb

## 2019-06-27 DIAGNOSIS — Z79899 Other long term (current) drug therapy: Secondary | ICD-10-CM

## 2019-06-27 DIAGNOSIS — R7309 Other abnormal glucose: Secondary | ICD-10-CM | POA: Diagnosis not present

## 2019-06-27 DIAGNOSIS — Z1211 Encounter for screening for malignant neoplasm of colon: Secondary | ICD-10-CM

## 2019-06-27 DIAGNOSIS — I350 Nonrheumatic aortic (valve) stenosis: Secondary | ICD-10-CM | POA: Diagnosis not present

## 2019-06-27 DIAGNOSIS — K582 Mixed irritable bowel syndrome: Secondary | ICD-10-CM

## 2019-06-27 DIAGNOSIS — R12 Heartburn: Secondary | ICD-10-CM

## 2019-06-27 DIAGNOSIS — R0989 Other specified symptoms and signs involving the circulatory and respiratory systems: Secondary | ICD-10-CM | POA: Diagnosis not present

## 2019-06-27 DIAGNOSIS — Z0000 Encounter for general adult medical examination without abnormal findings: Secondary | ICD-10-CM

## 2019-06-27 DIAGNOSIS — Z0001 Encounter for general adult medical examination with abnormal findings: Secondary | ICD-10-CM

## 2019-06-27 DIAGNOSIS — E559 Vitamin D deficiency, unspecified: Secondary | ICD-10-CM | POA: Diagnosis not present

## 2019-06-27 DIAGNOSIS — E782 Mixed hyperlipidemia: Secondary | ICD-10-CM | POA: Diagnosis not present

## 2019-06-27 DIAGNOSIS — Z136 Encounter for screening for cardiovascular disorders: Secondary | ICD-10-CM | POA: Diagnosis not present

## 2019-06-27 MED ORDER — DICYCLOMINE HCL 20 MG PO TABS: ORAL_TABLET | ORAL | 1 refills | Status: DC

## 2019-06-27 MED ORDER — ESOMEPRAZOLE MAGNESIUM 40 MG PO CPDR: DELAYED_RELEASE_CAPSULE | ORAL | 3 refills | Status: DC

## 2019-06-28 ENCOUNTER — Other Ambulatory Visit: Payer: Self-pay | Admitting: Internal Medicine

## 2019-06-28 DIAGNOSIS — E039 Hypothyroidism, unspecified: Secondary | ICD-10-CM

## 2019-06-28 LAB — COMPLETE METABOLIC PANEL WITH GFR
AG Ratio: 2.8 (calc) — ABNORMAL HIGH (ref 1.0–2.5)
ALT: 18 U/L (ref 6–29)
AST: 22 U/L (ref 10–35)
Albumin: 4.4 g/dL (ref 3.6–5.1)
Alkaline phosphatase (APISO): 88 U/L (ref 37–153)
BUN: 14 mg/dL (ref 7–25)
CO2: 27 mmol/L (ref 20–32)
Calcium: 9.6 mg/dL (ref 8.6–10.4)
Chloride: 102 mmol/L (ref 98–110)
Creat: 0.91 mg/dL (ref 0.60–0.93)
GFR, Est African American: 74 mL/min/{1.73_m2} (ref >=60)
GFR, Est Non African American: 64 mL/min/{1.73_m2} (ref >=60)
Globulin: 1.6 g/dL (calc) — ABNORMAL LOW (ref 1.9–3.7)
Glucose, Bld: 96 mg/dL (ref 65–99)
Potassium: 4.4 mmol/L (ref 3.5–5.3)
Sodium: 138 mmol/L (ref 135–146)
Total Bilirubin: 0.5 mg/dL (ref 0.2–1.2)
Total Protein: 6 g/dL — ABNORMAL LOW (ref 6.1–8.1)

## 2019-06-28 LAB — TSH: TSH: 4.72 mIU/L — ABNORMAL HIGH (ref 0.40–4.50)

## 2019-06-28 LAB — CBC WITH DIFFERENTIAL/PLATELET
Absolute Monocytes: 534 cells/uL (ref 200–950)
Basophils Absolute: 39 cells/uL (ref 0–200)
Basophils Relative: 0.8 %
Eosinophils Absolute: 147 cells/uL (ref 15–500)
Eosinophils Relative: 3 %
HCT: 38.6 % (ref 35.0–45.0)
Hemoglobin: 13.3 g/dL (ref 11.7–15.5)
Lymphs Abs: 1774 cells/uL (ref 850–3900)
MCH: 32.7 pg (ref 27.0–33.0)
MCHC: 34.5 g/dL (ref 32.0–36.0)
MCV: 94.8 fL (ref 80.0–100.0)
MPV: 9.4 fL (ref 7.5–12.5)
Monocytes Relative: 10.9 %
Neutro Abs: 2406 cells/uL (ref 1500–7800)
Neutrophils Relative %: 49.1 %
Platelets: 155 10*3/uL (ref 140–400)
RBC: 4.07 10*6/uL (ref 3.80–5.10)
RDW: 12.2 % (ref 11.0–15.0)
Total Lymphocyte: 36.2 %
WBC: 4.9 10*3/uL (ref 3.8–10.8)

## 2019-06-28 LAB — MICROALBUMIN / CREATININE URINE RATIO
Creatinine, Urine: 16 mg/dL — ABNORMAL LOW (ref 20–275)
Microalb, Ur: <0.2 mg/dL

## 2019-06-28 LAB — URINALYSIS, ROUTINE W REFLEX MICROSCOPIC
Bilirubin Urine: NEGATIVE
Glucose, UA: NEGATIVE
Hgb urine dipstick: NEGATIVE
Ketones, ur: NEGATIVE
Leukocytes,Ua: NEGATIVE
Nitrite: NEGATIVE
Protein, ur: NEGATIVE
Specific Gravity, Urine: 1.003 (ref 1.001–1.03)
pH: 7 (ref 5.0–8.0)

## 2019-06-28 LAB — LIPID PANEL
Cholesterol: 209 mg/dL — ABNORMAL HIGH (ref <200)
HDL: 110 mg/dL (ref >=50)
LDL Cholesterol (Calc): 85 mg/dL (calc)
Non-HDL Cholesterol (Calc): 99 mg/dL (calc) (ref <130)
Total CHOL/HDL Ratio: 1.9 (calc) (ref <5.0)
Triglycerides: 64 mg/dL (ref <150)

## 2019-06-28 LAB — HEMOGLOBIN A1C
Hgb A1c MFr Bld: 5.1 % of total Hgb (ref <5.7)
Mean Plasma Glucose: 100 (calc)
eAG (mmol/L): 5.5 (calc)

## 2019-06-28 LAB — MAGNESIUM: Magnesium: 1.8 mg/dL (ref 1.5–2.5)

## 2019-06-28 LAB — INSULIN, RANDOM: Insulin: 3.6 u[IU]/mL

## 2019-06-28 LAB — VITAMIN D 25 HYDROXY (VIT D DEFICIENCY, FRACTURES): Vit D, 25-Hydroxy: 69 ng/mL (ref 30–100)

## 2019-06-28 MED ORDER — LEVOTHYROXINE SODIUM 50 MCG PO TABS: ORAL_TABLET | ORAL | 3 refills | Status: DC

## 2019-06-29 ENCOUNTER — Telehealth: Payer: Self-pay | Admitting: *Deleted

## 2019-06-29 NOTE — Telephone Encounter (Signed)
Patient called with concerns regarding her thyroid results.  Explained to patient when the level is high, it indications a low thyroid level and the the new medication will replace the thyroid level. Patient made a 1 month [place] appointment to check thyroid.

## 2019-07-01 ENCOUNTER — Encounter: Payer: Self-pay | Admitting: Internal Medicine

## 2019-07-06 DIAGNOSIS — Z09 Encounter for follow-up examination after completed treatment for conditions other than malignant neoplasm: Secondary | ICD-10-CM | POA: Diagnosis not present

## 2019-08-01 ENCOUNTER — Other Ambulatory Visit: Payer: Self-pay

## 2019-08-01 ENCOUNTER — Ambulatory Visit (INDEPENDENT_AMBULATORY_CARE_PROVIDER_SITE_OTHER): Payer: PPO

## 2019-08-01 VITALS — BP 116/70 | HR 79 | Temp 97.2°F | Wt 126.8 lb

## 2019-08-01 DIAGNOSIS — E039 Hypothyroidism, unspecified: Secondary | ICD-10-CM | POA: Diagnosis not present

## 2019-08-01 LAB — TSH: TSH: 1.89 mIU/L (ref 0.40–4.50)

## 2019-08-01 NOTE — Progress Notes (Signed)
Patient presents to the office for a nurse visit to have TSH lab drawn. No questions or concerns. Vitals taken and recorded.

## 2019-08-08 DIAGNOSIS — Z85828 Personal history of other malignant neoplasm of skin: Secondary | ICD-10-CM | POA: Diagnosis not present

## 2019-08-08 DIAGNOSIS — L570 Actinic keratosis: Secondary | ICD-10-CM | POA: Diagnosis not present

## 2019-08-08 DIAGNOSIS — D0461 Carcinoma in situ of skin of right upper limb, including shoulder: Secondary | ICD-10-CM | POA: Diagnosis not present

## 2019-08-08 DIAGNOSIS — D485 Neoplasm of uncertain behavior of skin: Secondary | ICD-10-CM | POA: Diagnosis not present

## 2019-08-08 DIAGNOSIS — L821 Other seborrheic keratosis: Secondary | ICD-10-CM | POA: Diagnosis not present

## 2019-09-26 ENCOUNTER — Ambulatory Visit: Payer: PPO | Admitting: Physician Assistant

## 2019-09-29 NOTE — Progress Notes (Signed)
MEDICARE ANNUAL WELLNESS VISIT AND FOLLOW UP  Assessment:   Encounter for Medicare annual wellness exam 1 year  Essential hypertension At goal without medications at this time Monitor blood pressure at home; call if consistently over 130/80 Continue DASH diet.   Reminder to go to the ER if any CP, SOB, nausea, dizziness, severe HA, changes vision/speech, left arm numbness and tingling and jaw pain.  Mixed hyperlipidemia Continue low cholesterol diet and exercise.  Check lipid panel.   BMI 22.0-22.9, adult Continue to recommend diet heavy in fruits and veggies and low in animal meats, cheeses, and dairy products, appropriate calorie intake Discuss exercise recommendations routinely Continue to monitor weight at each visit  Aortic stenosis, moderate Followed by Dr. Johnsie Cancel  Seasonal allergic rhinitis, unspecified trigger - Allegra OTC, increase H20, allergy hygiene explained.  Vitamin D deficient osteomalacia Vitamin D at goal at recent check; continue to recommend supplementation for goal of 70-100 Defer vitamin D level  Female cystocele Doing much better after surgery  Depression/anxiety Doing well with wellbutrin, continue  Osteopenia Continue vitamin D, add weight bearing exercises.  Get with MGM this year  Encounter for hepatitis C screening test for low risk patient -     Hepatitis C antibody  Epigastric pain -     Amylase -     Lipase - stop wine, no NSAIDS, no supplments per patient - continue nexium as needed - if not better may refer to GI for EGD - no RUQ pain   Over 30 minutes of exam, counseling, chart review and critical decision making was performed Declines labs, will get next OV Future Appointments  Date Time Provider [place]  01/11/2020  9:30 AM Unk Pinto, MD [facility] None  07/04/2020  9:00 AM Unk Pinto, MD [facility] None     Plan:   During the course of the visit the patient was educated and counseled about  appropriate screening and preventive services including:    Pneumococcal vaccine   Prevnar 13  Influenza vaccine  Td vaccine  Screening electrocardiogram  Bone densitometry screening  Colorectal cancer screening  Diabetes screening  Glaucoma screening  Nutrition counseling   Advanced directives: requested   Subjective:  Kathryn Chavez is a 69 y.o. female who presents for Medicare Annual Wellness Visit and 3 month follow up.   She also has significant hx/o R lumpectomy in 2001 treated with Chemoradiation and followed with 5 years of Arimidex. She is followed by Dr. Johnsie Cancel for moderate aortic stenosis. 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    She has been having stomach issues, last visit given nexium and dicyclomine. She was having upper AB pain with any food.  She is normally constipated since her rectocele repair.  She has been taking motrin but very rarely- took after a fishing trip 2 weeks ago. She does drink wine 2-3 x a week.   She is doing very well on the wellbutrin, she very rarely takes xanax.   BMI is Body mass index is 20.35 kg/m., she has been working on diet and exercise. Wt Readings from Last 3 Encounters:  10/02/19 124 lb 3.2 oz (56.3 kg)  08/01/19 126 lb 12.8 oz (57.5 kg)  06/27/19 128 lb 6.4 oz (58.2 kg)    Her blood pressure has been controlled at home, today their BP is BP: 100/62 She does workout. She denies chest pain, shortness of breath,  dizziness.   She is on cholesterol medication, increased to 1 tablet of the crestor last visit and denies myalgias. Her cholesterol is not at goal. The cholesterol last visit was:   Lab Results  Component Value Date   CHOL 209 (H) 06/27/2019   HDL 110 06/27/2019   LDLCALC 85 06/27/2019   LDLDIRECT 73.3 04/30/2011   TRIG 64 06/27/2019   CHOLHDL 1.9 06/27/2019    Last A1C in the office was:  Lab  Results  Component Value Date   HGBA1C 5.1 06/27/2019   Last GFR: Lab Results  Component Value Date   GFRNONAA 64 06/27/2019   Patient is on Vitamin D supplement and at goal at recent check:    Lab Results  Component Value Date   VD25OH 69 06/27/2019      Medication Review: Current Outpatient Medications on File Prior to Visit  Medication Sig Dispense Refill  . ALPRAZolam (XANAX) 0.5 MG tablet Take 1/2-1 tablet 1 to 2 x /week if needed for Anxiety or Sleep & Limit to 5 days /week 30 tablet 0  . aspirin EC 81 MG tablet Take 81 mg by mouth daily.    Marland Kitchen buPROPion (WELLBUTRIN XL) 300 MG 24 hr tablet Take 1 tablet every morning for Mood, Anxiety, Focus & Concentration 90 tablet 3  . esomeprazole (NEXIUM) 40 MG capsule Take 1 capsule Daily for Heartburn 90 capsule 3  . fexofenadine (ALLEGRA) 60 MG tablet Take 60 mg by mouth 2 (two) times daily.    . fluticasone (FLONASE) 50 MCG/ACT nasal spray Place 2 sprays into both nostrils daily. 16 g 0  . gabapentin (NEURONTIN) 100 MG capsule Take 1 capsule 3 x /day for Pain 270 capsule 3  . levothyroxine (SYNTHROID) 50 MCG tablet Take 1 tablet daily on an empty stomach with only water for 30 minutes & no Antacid meds, Calcium or Magnesium for 4 hours & avoid Biotin 90 tablet 3  . Misc. Devices (VAGINAL CREAM APPLICATOR) MISC Patient is to apply pea size amount to vaginal area 2-3 times a week. 1 each 1  . rosuvastatin (CRESTOR) 5 MG tablet Take 1 tablet Daily for Cholesterol 90 tablet 3   No current facility-administered medications on file prior to visit.    Allergies  Allergen Reactions  . Influenza Vaccines Nausea And Vomiting and Other (See Comments)    dizziness  . Penicillamine Hives  . Penicillins     REACTION: Hives  . Sulfamethoxazole Hives  . Sulfonamide Derivatives     REACTION: Hives    Current Problems (verified) Patient Active Problem List   Diagnosis Date Noted  . Hypothyroidism 10/02/2019  . Osteopenia 11/04/2017  .  Onychomycosis of great toe 09/08/2017  . Aortic stenosis, moderate 09/09/2016  . Hyperlipidemia 01/16/2015  . Abnormal ECG 01/23/2014  . Female cystocele 02/09/2011  . Vitamin D deficient osteomalacia 10/28/2010  . Essential hypertension 12/16/2009  . Allergic rhinitis 12/16/2009    Screening Tests Immunization History  Administered Date(s) Administered  . Influenza Split 02/26/2011, 03/11/2012  . Influenza, High Dose Seasonal PF 01/29/2016  . Influenza,inj,Quad PF,6+ Mos 05/02/2013  . PFIZER SARS-COV-2 Vaccination 06/12/2019, 07/03/2019  . Pneumococcal Conjugate-13 08/05/2017  . Pneumococcal Polysaccharide-23 01/16/2015  . Tdap 10/28/2010   Preventative care: Last colonoscopy: 2013 due in 2 years Last mammogram: 11/2027 Last pap smear/pelvic exam: 2010, declines further, hx hysterectomy   DEXA: 10/2017 Femur Neck Left is 0.852 g/cm2 with a T-score of -1.3. Echo 02/2018  Prior vaccinations: TD or Tdap: 2012  Influenza: 2017 Pneumococcal: 2016 Prevnar13: 2019 Shingles/Zostavax: n/a in office  Names of Other Physician/Practitioners you currently use: 1. [facility] here for primary care 2. Dr. [place], eye doctor, last visit 07/2017 3. Dr. Marland Kitchen , dentist, last visit 2018 due for follow up   Patient Care Team: Unk Pinto, MD as PCP - General (Internal Medicine) Josue Hector, MD as PCP - Cardiology (Cardiology) [place], [place], [place] (Optometry)  SURGICAL HISTORY She  has a past surgical history that includes Abdominal hysterectomy; Cystocele repair (N/A, 05/12/2018); and Breast lumpectomy (Right, 2000). FAMILY HISTORY Her family history includes Arthritis in an other family member; Breast cancer in an other family member; Breast cancer (age of onset: 55) in her daughter; Breast cancer (age of onset: 52) in her daughter; Breast cancer (age of onset: 41) in her mother; Hyperlipidemia in an other family member; Lung cancer in her mother and  another family member; Other in her father; Prostate cancer in an other family member. SOCIAL HISTORY She  reports that she has never smoked. She has never used smokeless tobacco. She reports that she does not drink alcohol and does not use drugs.  MEDICARE WELLNESS OBJECTIVES: Physical activity: Current Exercise Habits: Home exercise routine, Type of exercise: walking, Time (Minutes): 30, Frequency (Times/Week): 3, Weekly Exercise (Minutes/Week): 90, Intensity: Mild Cardiac risk factors: Cardiac Risk Factors include: dyslipidemia;advanced age (>35men, >69 women) Depression/mood screen:   Depression screen [facility] 2/9 10/02/2019  Decreased Interest -  Down, Depressed, Hopeless 0  PHQ - 2 Score 0  Altered sleeping 0  Tired, decreased energy 0  Change in appetite 0  Feeling bad or failure about yourself  0  Trouble concentrating 0  Moving slowly or fidgety/restless 0  Suicidal thoughts 0  PHQ-9 Score 0  Difficult doing work/chores Not difficult at all    ADLs:  In your present state of health, do you have any difficulty performing the following activities: 10/02/2019 06/26/2019  Hearing? N N  Comment some difficulty, no hearing aids yet -  Vision? N N  Difficulty concentrating or making decisions? N N  Walking or climbing stairs? N N  Dressing or bathing? N N  Doing errands, shopping? N N  Some recent data might be hidden     Cognitive Testing  Alert? Yes  Normal Appearance?Yes  Oriented to person? Yes  Place? Yes   Time? Yes  Recall of three objects?  Yes  Can perform simple calculations? Yes  Displays appropriate judgment?Yes  Can read the correct time from a watch face?Yes  EOL planning: Does Patient Have a Medical Advance Directive?: Yes Type of Advance Directive: Healthcare Power of Attorney, Living will [place] in Chart?: No - copy requested  Review of Systems  Constitutional: Negative for malaise/fatigue and weight loss.  HENT: Negative for  hearing loss and tinnitus.   Eyes: Negative for blurred vision and double vision.  Respiratory: Negative for cough, sputum production, shortness of breath and wheezing.   Cardiovascular: Negative for chest pain, palpitations, orthopnea, claudication, leg swelling and PND.  Gastrointestinal: Negative for abdominal pain, blood in stool, constipation, diarrhea, heartburn, melena, nausea and vomiting.  Genitourinary: Negative.   Musculoskeletal: Negative for falls, joint pain and myalgias.  Skin: Negative for rash.  Neurological: Negative for dizziness, tingling, sensory change, weakness and headaches.  Endo/Heme/Allergies: Negative for polydipsia.  Psychiatric/Behavioral: Negative.  Negative for depression, memory loss, substance abuse and suicidal ideas. The patient is not nervous/anxious and does not  have insomnia.   All other systems reviewed and are negative.    Objective:     Today's Vitals   10/02/19 0900  BP: 100/62  Pulse: 62  Temp: 97.6 F (36.4 C)  SpO2: 100%  Weight: 124 lb 3.2 oz (56.3 kg)  Height: 5' 5.5" (1.664 m)  PainSc: 0-No pain   Body mass index is 20.35 kg/m.  General appearance: alert, no distress, WD/WN, female HEENT: normocephalic, sclerae anicteric, TMs pearly, nares patent, no discharge or erythema, pharynx normal Oral cavity: MMM, no lesions Neck: supple, no lymphadenopathy, no thyromegaly, no masses Heart: RRR, normal S1, S2, systolic murmur radiation to the carotids. Lungs: CTA bilaterally, no wheezes, rhonchi, or rales Abdomen: +bs, soft, non tender, non distended, no masses, no hepatomegaly, no splenomegaly Musculoskeletal: nontender, no swelling, no obvious deformity Extremities: no edema, no cyanosis, no clubbing Pulses: 2+ symmetric, upper and lower extremities, normal cap refill Neurological: alert, oriented x 3, CN2-12 intact, strength normal upper extremities and lower extremities, sensation normal throughout, DTRs 2+ throughout, no cerebellar  signs, gait normal Psychiatric: normal affect, behavior normal, pleasant   Medicare Attestation I have personally reviewed: The patient's medical and social history Their use of alcohol, tobacco or illicit drugs Their current medications and supplements The patient's functional ability including ADLs,fall risks, home safety risks, cognitive, and hearing and visual impairment Diet and physical activities Evidence for depression or mood disorders  The patient's weight, height, BMI, and visual acuity have been recorded in the chart.  I have made referrals, counseling, and provided education to the patient based on review of the above and I have provided the patient with a written personalized care plan for preventive services.     Vicie Mutters, PA-C   10/02/2019

## 2019-10-02 ENCOUNTER — Ambulatory Visit (INDEPENDENT_AMBULATORY_CARE_PROVIDER_SITE_OTHER): Payer: PPO | Admitting: Physician Assistant

## 2019-10-02 ENCOUNTER — Encounter: Payer: Self-pay | Admitting: Physician Assistant

## 2019-10-02 ENCOUNTER — Other Ambulatory Visit: Payer: Self-pay

## 2019-10-02 VITALS — BP 100/62 | HR 62 | Temp 97.6°F | Ht 65.5 in | Wt 124.2 lb

## 2019-10-02 DIAGNOSIS — R9431 Abnormal electrocardiogram [ECG] [EKG]: Secondary | ICD-10-CM

## 2019-10-02 DIAGNOSIS — Z0001 Encounter for general adult medical examination with abnormal findings: Secondary | ICD-10-CM

## 2019-10-02 DIAGNOSIS — I350 Nonrheumatic aortic (valve) stenosis: Secondary | ICD-10-CM

## 2019-10-02 DIAGNOSIS — E039 Hypothyroidism, unspecified: Secondary | ICD-10-CM | POA: Diagnosis not present

## 2019-10-02 DIAGNOSIS — I10 Essential (primary) hypertension: Secondary | ICD-10-CM

## 2019-10-02 DIAGNOSIS — E782 Mixed hyperlipidemia: Secondary | ICD-10-CM | POA: Diagnosis not present

## 2019-10-02 DIAGNOSIS — M85852 Other specified disorders of bone density and structure, left thigh: Secondary | ICD-10-CM

## 2019-10-02 DIAGNOSIS — E8809 Other disorders of plasma-protein metabolism, not elsewhere classified: Secondary | ICD-10-CM | POA: Diagnosis not present

## 2019-10-02 DIAGNOSIS — E559 Vitamin D deficiency, unspecified: Secondary | ICD-10-CM

## 2019-10-02 DIAGNOSIS — Z1159 Encounter for screening for other viral diseases: Secondary | ICD-10-CM | POA: Diagnosis not present

## 2019-10-02 DIAGNOSIS — M839 Adult osteomalacia, unspecified: Secondary | ICD-10-CM | POA: Diagnosis not present

## 2019-10-02 DIAGNOSIS — N8110 Cystocele, unspecified: Secondary | ICD-10-CM | POA: Diagnosis not present

## 2019-10-02 DIAGNOSIS — R1013 Epigastric pain: Secondary | ICD-10-CM | POA: Diagnosis not present

## 2019-10-02 DIAGNOSIS — R6889 Other general symptoms and signs: Secondary | ICD-10-CM | POA: Diagnosis not present

## 2019-10-02 DIAGNOSIS — Z0000 Encounter for general adult medical examination without abnormal findings: Secondary | ICD-10-CM

## 2019-10-02 DIAGNOSIS — J302 Other seasonal allergic rhinitis: Secondary | ICD-10-CM | POA: Diagnosis not present

## 2019-10-02 NOTE — Patient Instructions (Addendum)
[employer] with no obligation # [phone] Do not have to be a member Tues-Sat 10-6  [facility]- free test with no obligation # [phone] MUST BE A MEMBER Call for store hours    Take the nexium over the counter for 2 weeks, then go to pepcid 20 or 40mg  at night for 2 weeks, then you can stop or continue as needed.   Avoid alcohol, spicy foods, NSAIDS (aleve, ibuprofen) at this time.  See foods below.   Food Choices for Gastroesophageal Reflux Disease When you have gastroesophageal reflux disease (GERD), the foods you eat and your eating habits are very important. Choosing the right foods can help ease the discomfort of GERD. WHAT GENERAL GUIDELINES DO I NEED TO FOLLOW?  Choose fruits, vegetables, whole grains, low-fat dairy products, and low-fat meat, fish, and poultry.  Limit fats such as oils, salad dressings, butter, nuts, and avocado.  Keep a food diary to identify foods that cause symptoms.  Avoid foods that cause reflux. These may be different for different people.  Eat frequent small meals instead of three large meals each day.  Eat your meals slowly, in a relaxed setting.  Limit fried foods.  Cook foods using methods other than frying.  Avoid drinking alcohol.  Avoid drinking large amounts of liquids with your meals.  Avoid bending over or lying down until 2-3 hours after eating. WHAT FOODS ARE NOT RECOMMENDED? The following are some foods and drinks that may worsen your symptoms: Vegetables Tomatoes. Tomato juice. Tomato and spaghetti sauce. Chili peppers. Onion and garlic. Horseradish. Fruits Oranges, grapefruit, and lemon (fruit and juice). Meats High-fat meats, fish, and poultry. This includes hot dogs, ribs, ham, sausage, salami, and bacon. Dairy Whole milk and chocolate milk. Sour cream. Cream. Butter. Ice cream. Cream cheese.  Beverages Coffee and tea, with or without caffeine. Carbonated beverages or energy  drinks. Condiments Hot sauce. Barbecue sauce.  Sweets/Desserts Chocolate and cocoa. Donuts. Peppermint and spearmint. Fats and Oils High-fat foods, including [place] fries and potato chips. Other Vinegar. Strong spices, such as black pepper, white pepper, red pepper, cayenne, curry powder, cloves, ginger, and chili powder.   Hypothyroidism  Hypothyroidism is when the thyroid gland does not make enough of certain hormones (it is underactive). The thyroid gland is a small gland located in the lower front part of the neck, just in front of the windpipe (trachea). This gland makes hormones that help control how the body uses food for energy (metabolism) as well as how the heart and brain function. These hormones also play a role in keeping your bones strong. When the thyroid is underactive, it produces too little of the hormones thyroxine (T4) and triiodothyronine (T3). What are the causes? This condition may be caused by:  Hashimoto's disease. This is a disease in which the body's disease-fighting system (immune system) attacks the thyroid gland. This is the most common cause.  Viral infections. What increases the risk? You are more likely to develop this condition if:  You are female.  You have a family history of thyroid conditions.  You use a medicine called lithium.  You take medicines that affect the immune system (immunosuppressants). What are the signs or symptoms? Symptoms of this condition include:  Feeling as though you have no energy (lethargy).  Not being able to tolerate cold.  Weight gain that is not explained by a change in diet or exercise habits.  Lack of appetite.  Dry skin.  Coarse hair.  Menstrual irregularity.  Slowing of thought processes.  Constipation.  Sadness or depression. How is this diagnosed? This condition may be diagnosed based on:  Your symptoms, your medical history, and a physical exam.  Blood tests. You may also have imaging  tests, such as an ultrasound or MRI. How is this treated? This condition is treated with medicine that replaces the thyroid hormones that your body does not make. After you begin treatment, it may take several weeks for symptoms to go away. Follow these instructions at home:  Take over-the-counter and prescription medicines only as told by your health care provider.  If you start taking any new medicines, tell your health care provider.  Keep all follow-up visits as told by your health care provider. This is important. ? As your condition improves, your dosage of thyroid hormone medicine may change. ? You will need to have blood tests regularly so that your health care provider can monitor your condition. Contact a health care provider if:  Your symptoms do not get better with treatment.  You are taking thyroid replacement medicine and you: ? Sweat a lot. ? Have tremors. ? Feel anxious. ? Lose weight rapidly. ? Cannot tolerate heat. ? Have emotional swings. ? Have diarrhea. ? Feel weak. Get help right away if you have:  Chest pain.  An irregular heartbeat.  A rapid heartbeat.  Difficulty breathing. Summary  Hypothyroidism is when the thyroid gland does not make enough of certain hormones (it is underactive).  When the thyroid is underactive, it produces too little of the hormones thyroxine (T4) and triiodothyronine (T3).  The most common cause is Hashimoto's disease, a disease in which the body's disease-fighting system (immune system) attacks the thyroid gland. The condition can also be caused by viral infections, medicine, pregnancy, or past radiation treatment to the head or neck.  Symptoms may include weight gain, dry skin, constipation, feeling as though you do not have energy, and not being able to tolerate cold.  This condition is treated with medicine to replace the thyroid hormones that your body does not make. This information is not intended to replace advice  given to you by your health care provider. Make sure you discuss any questions you have with your health care provider. Document Revised: 03/19/2017 Document Reviewed: 03/17/2017 Elsevier Patient Education  2020 [employer].

## 2019-10-03 LAB — CBC WITH DIFFERENTIAL/PLATELET
Absolute Monocytes: 515 cells/uL (ref 200–950)
Basophils Absolute: 39 cells/uL (ref 0–200)
Basophils Relative: 0.8 %
Eosinophils Absolute: 78 cells/uL (ref 15–500)
Eosinophils Relative: 1.6 %
HCT: 38.2 % (ref 35.0–45.0)
Hemoglobin: 12.6 g/dL (ref 11.7–15.5)
Lymphs Abs: 1749 cells/uL (ref 850–3900)
MCH: 31.7 pg (ref 27.0–33.0)
MCHC: 33 g/dL (ref 32.0–36.0)
MCV: 96 fL (ref 80.0–100.0)
MPV: 9.7 fL (ref 7.5–12.5)
Monocytes Relative: 10.5 %
Neutro Abs: 2519 cells/uL (ref 1500–7800)
Neutrophils Relative %: 51.4 %
Platelets: 186 10*3/uL (ref 140–400)
RBC: 3.98 10*6/uL (ref 3.80–5.10)
RDW: 12.4 % (ref 11.0–15.0)
Total Lymphocyte: 35.7 %
WBC: 4.9 10*3/uL (ref 3.8–10.8)

## 2019-10-03 LAB — LIPID PANEL
Cholesterol: 226 mg/dL — ABNORMAL HIGH (ref <200)
HDL: 92 mg/dL (ref >=50)
LDL Cholesterol (Calc): 120 mg/dL (calc) — ABNORMAL HIGH
Non-HDL Cholesterol (Calc): 134 mg/dL (calc) — ABNORMAL HIGH (ref <130)
Total CHOL/HDL Ratio: 2.5 (calc) (ref <5.0)
Triglycerides: 57 mg/dL (ref <150)

## 2019-10-03 LAB — HEPATITIS C ANTIBODY
Hepatitis C Ab: NONREACTIVE
SIGNAL TO CUT-OFF: 0 (ref <1.00)

## 2019-10-03 LAB — COMPLETE METABOLIC PANEL WITH GFR
AG Ratio: 2.9 (calc) — ABNORMAL HIGH (ref 1.0–2.5)
ALT: 14 U/L (ref 6–29)
AST: 18 U/L (ref 10–35)
Albumin: 4.3 g/dL (ref 3.6–5.1)
Alkaline phosphatase (APISO): 104 U/L (ref 37–153)
BUN: 16 mg/dL (ref 7–25)
CO2: 26 mmol/L (ref 20–32)
Calcium: 9.6 mg/dL (ref 8.6–10.4)
Chloride: 106 mmol/L (ref 98–110)
Creat: 0.89 mg/dL (ref 0.60–0.93)
GFR, Est African American: 76 mL/min/{1.73_m2} (ref >=60)
GFR, Est Non African American: 66 mL/min/{1.73_m2} (ref >=60)
Globulin: 1.5 g/dL (calc) — ABNORMAL LOW (ref 1.9–3.7)
Glucose, Bld: 82 mg/dL (ref 65–99)
Potassium: 4.3 mmol/L (ref 3.5–5.3)
Sodium: 142 mmol/L (ref 135–146)
Total Bilirubin: 0.5 mg/dL (ref 0.2–1.2)
Total Protein: 5.8 g/dL — ABNORMAL LOW (ref 6.1–8.1)

## 2019-10-03 LAB — AMYLASE: Amylase: 58 U/L (ref 21–101)

## 2019-10-03 LAB — TSH: TSH: 1.08 mIU/L (ref 0.40–4.50)

## 2019-10-03 LAB — LIPASE: Lipase: 23 U/L (ref 7–60)

## 2019-10-03 NOTE — Addendum Note (Signed)
Addended by: Vicie Mutters R on: 10/03/2019 08:15 AM   Modules accepted: Orders

## 2019-10-05 ENCOUNTER — Telehealth: Payer: Self-pay | Admitting: *Deleted

## 2019-10-05 NOTE — Telephone Encounter (Signed)
Returned a call to the patient to go over her recent lab results. Patient will call back to schedule a NV in about 2 weeks.

## 2019-10-06 ENCOUNTER — Other Ambulatory Visit: Payer: Self-pay | Admitting: Physician Assistant

## 2019-10-06 DIAGNOSIS — Z1231 Encounter for screening mammogram for malignant neoplasm of breast: Secondary | ICD-10-CM

## 2019-10-18 ENCOUNTER — Ambulatory Visit (INDEPENDENT_AMBULATORY_CARE_PROVIDER_SITE_OTHER): Payer: PPO

## 2019-10-18 ENCOUNTER — Other Ambulatory Visit: Payer: Self-pay

## 2019-10-18 DIAGNOSIS — E8809 Other disorders of plasma-protein metabolism, not elsewhere classified: Secondary | ICD-10-CM | POA: Diagnosis not present

## 2019-10-18 DIAGNOSIS — Z79899 Other long term (current) drug therapy: Secondary | ICD-10-CM

## 2019-10-18 NOTE — Progress Notes (Signed)
Patient presents to the office for a nurse visit to have labs done. Vitals taken and recorded.

## 2019-10-19 LAB — COMPLETE METABOLIC PANEL WITH GFR
AG Ratio: 2.9 (calc) — ABNORMAL HIGH (ref 1.0–2.5)
ALT: 14 U/L (ref 6–29)
AST: 18 U/L (ref 10–35)
Albumin: 4.4 g/dL (ref 3.6–5.1)
Alkaline phosphatase (APISO): 91 U/L (ref 37–153)
BUN: 16 mg/dL (ref 7–25)
CO2: 27 mmol/L (ref 20–32)
Calcium: 9.7 mg/dL (ref 8.6–10.4)
Chloride: 103 mmol/L (ref 98–110)
Creat: 0.79 mg/dL (ref 0.60–0.93)
GFR, Est African American: 88 mL/min/{1.73_m2} (ref >=60)
GFR, Est Non African American: 76 mL/min/{1.73_m2} (ref >=60)
Globulin: 1.5 g/dL (calc) — ABNORMAL LOW (ref 1.9–3.7)
Glucose, Bld: 85 mg/dL (ref 65–99)
Potassium: 4.3 mmol/L (ref 3.5–5.3)
Sodium: 139 mmol/L (ref 135–146)
Total Bilirubin: 0.4 mg/dL (ref 0.2–1.2)
Total Protein: 5.9 g/dL — ABNORMAL LOW (ref 6.1–8.1)

## 2019-10-19 LAB — URINALYSIS, ROUTINE W REFLEX MICROSCOPIC
Bilirubin Urine: NEGATIVE
Glucose, UA: NEGATIVE
Hgb urine dipstick: NEGATIVE
Ketones, ur: NEGATIVE
Leukocytes,Ua: NEGATIVE
Nitrite: NEGATIVE
Protein, ur: NEGATIVE
Specific Gravity, Urine: 1.009 (ref 1.001–1.03)
pH: 7 (ref 5.0–8.0)

## 2019-10-19 LAB — MICROALBUMIN / CREATININE URINE RATIO
Creatinine, Urine: 37 mg/dL (ref 20–275)
Microalb, Ur: <0.2 mg/dL

## 2019-11-30 ENCOUNTER — Telehealth: Payer: Self-pay | Admitting: Cardiovascular Disease

## 2019-11-30 NOTE — Telephone Encounter (Signed)
Ok to write letter due to her aortic valve disease Should wear mask in crowded environment

## 2019-11-30 NOTE — Telephone Encounter (Signed)
Left message for patient to call back  

## 2019-11-30 NOTE — Telephone Encounter (Signed)
New Message:     Pt says she needs a letter to state that she is unable to wear the Mask all day, because of her condition.

## 2019-11-30 NOTE — Telephone Encounter (Signed)
Will send request to Dr. Johnsie Cancel.

## 2019-12-06 NOTE — Telephone Encounter (Signed)
Will mail letter to patient.  

## 2019-12-21 ENCOUNTER — Ambulatory Visit
Admission: RE | Admit: 2019-12-21 | Discharge: 2019-12-21 | Disposition: A | Discharge status: Home / Self Care | Payer: PPO | Source: Ambulatory Visit | Attending: Physician Assistant | Admitting: Physician Assistant

## 2019-12-21 ENCOUNTER — Other Ambulatory Visit: Payer: Self-pay

## 2019-12-21 DIAGNOSIS — M85851 Other specified disorders of bone density and structure, right thigh: Secondary | ICD-10-CM | POA: Diagnosis not present

## 2019-12-21 DIAGNOSIS — M85852 Other specified disorders of bone density and structure, left thigh: Secondary | ICD-10-CM

## 2019-12-21 DIAGNOSIS — Z1231 Encounter for screening mammogram for malignant neoplasm of breast: Secondary | ICD-10-CM | POA: Diagnosis not present

## 2019-12-21 DIAGNOSIS — Z780 Asymptomatic menopausal state: Secondary | ICD-10-CM | POA: Diagnosis not present

## 2020-01-10 DIAGNOSIS — Z20822 Contact with and (suspected) exposure to covid-19: Secondary | ICD-10-CM | POA: Diagnosis not present

## 2020-01-10 DIAGNOSIS — L309 Dermatitis, unspecified: Secondary | ICD-10-CM | POA: Diagnosis not present

## 2020-01-10 DIAGNOSIS — R05 Cough: Secondary | ICD-10-CM | POA: Diagnosis not present

## 2020-01-10 DIAGNOSIS — L821 Other seborrheic keratosis: Secondary | ICD-10-CM | POA: Diagnosis not present

## 2020-01-10 DIAGNOSIS — L570 Actinic keratosis: Secondary | ICD-10-CM | POA: Diagnosis not present

## 2020-01-10 DIAGNOSIS — Z85828 Personal history of other malignant neoplasm of skin: Secondary | ICD-10-CM | POA: Diagnosis not present

## 2020-01-11 ENCOUNTER — Ambulatory Visit (INDEPENDENT_AMBULATORY_CARE_PROVIDER_SITE_OTHER): Payer: PPO | Admitting: Internal Medicine

## 2020-01-11 ENCOUNTER — Other Ambulatory Visit: Payer: Self-pay

## 2020-01-11 ENCOUNTER — Encounter: Payer: Self-pay | Admitting: Internal Medicine

## 2020-01-11 VITALS — BP 124/84 | HR 84 | Temp 97.2°F | Resp 16 | Ht 65.5 in | Wt 123.6 lb

## 2020-01-11 DIAGNOSIS — E559 Vitamin D deficiency, unspecified: Secondary | ICD-10-CM | POA: Diagnosis not present

## 2020-01-11 DIAGNOSIS — R7309 Other abnormal glucose: Secondary | ICD-10-CM

## 2020-01-11 DIAGNOSIS — I10 Essential (primary) hypertension: Secondary | ICD-10-CM

## 2020-01-11 DIAGNOSIS — E782 Mixed hyperlipidemia: Secondary | ICD-10-CM | POA: Diagnosis not present

## 2020-01-11 DIAGNOSIS — J0410 Acute tracheitis without obstruction: Secondary | ICD-10-CM

## 2020-01-11 DIAGNOSIS — E039 Hypothyroidism, unspecified: Secondary | ICD-10-CM | POA: Diagnosis not present

## 2020-01-11 DIAGNOSIS — J0141 Acute recurrent pansinusitis: Secondary | ICD-10-CM | POA: Diagnosis not present

## 2020-01-11 DIAGNOSIS — Z79899 Other long term (current) drug therapy: Secondary | ICD-10-CM | POA: Diagnosis not present

## 2020-01-11 MED ORDER — BENZONATATE 200 MG PO CAPS: ORAL_CAPSULE | ORAL | 1 refills | Status: DC

## 2020-01-11 MED ORDER — PROMETHAZINE-CODEINE 6.25-10 MG/5ML PO SYRP: ORAL_SOLUTION | ORAL | 1 refills | Status: DC

## 2020-01-11 MED ORDER — DEXAMETHASONE 4 MG PO TABS: ORAL_TABLET | ORAL | 0 refills | Status: DC

## 2020-01-11 MED ORDER — DOXYCYCLINE HYCLATE 100 MG PO CAPS: ORAL_CAPSULE | ORAL | 0 refills | Status: DC

## 2020-01-11 NOTE — Patient Instructions (Signed)

## 2020-01-11 NOTE — Progress Notes (Signed)
History of Present Illness:       This very nice 69 y.o. DWF presents for  6 month follow up with HTN, HLD, Pre-Diabetes and Vitamin D Deficiency.       Patient is followed for labile HTN  (1990's) & BP has been controlled at home. Patient is followed by Dr Johnsie Cancel for moderate AoS.  Todays BP is at goal -  124/84. Patient has had no complaints of any cardiac type chest pain, palpitations, dyspnea / orthopnea / PND, dizziness, claudication, or dependent edema.      Hyperlipidemia is not controlled with diet & low dose Rosuvastatin. Patient denies myalgias or other med SEs. Last Lipids were Not at goal:  Lab Results  Component Value Date   CHOL 226 (H) 10/02/2019   HDL 92 10/02/2019   LDLCALC 120 (H) 10/02/2019   LDLDIRECT 73.3 04/30/2011   TRIG 57 10/02/2019   CHOLHDL 2.5 10/02/2019    Also, the patient has been monitored expectantly for glucose intolerance and has had no symptoms of reactive hypoglycemia, diabetic polys, paresthesias or visual blurring.  Last A1c was at goal:  Lab Results  Component Value Date   HGBA1C 5.1 06/27/2019            In March this year (2021), she was dx'd Hypothyroid and initiated on thyroid replacement.       Further, the patient also has history of Vitamin D Deficiency ("25" /2016)  and supplements vitamin D without any suspected side-effects. Last vitamin D was at goal:  Lab Results  Component Value Date   VD25OH 69 06/27/2019    Current Outpatient Medications on File Prior to Visit  Medication Sig   ALPRAZolam (XANAX) 0.5 MG tablet Take 1/2-1 tablet 1 to 2 x /week if needed for Anxiety or Sleep & Limit to 5 days /week   aspirin EC 81 MG tablet Take 81 mg by mouth daily.   buPROPion (WELLBUTRIN XL) 300 MG 24 hr tablet Take 1 tablet every morning for Mood, Anxiety, Focus & Concentration   esomeprazole (NEXIUM) 40 MG capsule Take 1 capsule Daily for Heartburn   fexofenadine (ALLEGRA) 60 MG tablet Take 60 mg by mouth 2 (two) times  daily.   fluticasone (FLONASE) 50 MCG/ACT nasal spray Place 2 sprays into both nostrils daily.   gabapentin (NEURONTIN) 100 MG capsule Take 1 capsule 3 x /day for Pain   levothyroxine (SYNTHROID) 50 MCG tablet Take 1 tablet daily on an empty stomach with only water for 30 minutes & no Antacid meds, Calcium or Magnesium for 4 hours & avoid Biotin   Misc. Devices (VAGINAL CREAM APPLICATOR) MISC Patient is to apply pea size amount to vaginal area 2-3 times a week.   rosuvastatin (CRESTOR) 5 MG tablet Take 1 tablet Daily for Cholesterol     Allergies  Allergen Reactions   Influenza Vaccines Nausea And Vomiting and Other (See Comments)    dizziness   Penicillamine Hives   Penicillins     REACTION: Hives   Sulfamethoxazole Hives   Sulfonamide Derivatives     REACTION: Hives    PMHx:   Past Medical History:  Diagnosis Date   Allergy    rhinitis   Aortic stenosis, moderate    on echo in 2015.   Cancer [facility])    breast   Hyperlipidemia    Hypertension    Personal history of chemotherapy 2000   Personal history of radiation therapy 2000    Immunization History  Administered Date(s) Administered   Influenza Split 02/26/2011, 03/11/2012   Influenza, High Dose Seasonal PF 01/29/2016   Influenza,inj,Quad PF,6+ Mos 05/02/2013   PFIZER SARS-COV-2 Vaccination 06/12/2019, 07/03/2019   Pneumococcal Conjugate-13 08/05/2017   Pneumococcal Polysaccharide-23 01/16/2015   Tdap 10/28/2010    Past Surgical History:  Procedure Laterality Date   ABDOMINAL HYSTERECTOMY     BREAST LUMPECTOMY Right 2000   malignant   CYSTOCELE REPAIR N/A 05/12/2018    FHx:    Reviewed / unchanged  SHx:    Reviewed / unchanged   Systems Review:  Constitutional: Denies fever, chills, wt changes, headaches, insomnia, fatigue, night sweats, change in appetite. Eyes: Denies redness, blurred vision, diplopia, discharge, itchy, watery eyes.  ENT: Denies discharge, congestion, post  nasal drip, epistaxis, sore throat, earache, hearing loss, dental pain, tinnitus, vertigo, sinus pain, snoring.  CV: Denies chest pain, palpitations, irregular heartbeat, syncope, dyspnea, diaphoresis, orthopnea, PND, claudication or edema. Respiratory: denies cough, dyspnea, DOE, pleurisy, hoarseness, laryngitis, wheezing.  Gastrointestinal: Denies dysphagia, odynophagia, heartburn, reflux, water brash, abdominal pain or cramps, nausea, vomiting, bloating, diarrhea, constipation, hematemesis, melena, hematochezia  or hemorrhoids. Genitourinary: Denies dysuria, frequency, urgency, nocturia, hesitancy, discharge, hematuria or flank pain. Musculoskeletal: Denies arthralgias, myalgias, stiffness, jt. swelling, pain, limping or strain/sprain.  Skin: Denies pruritus, rash, hives, warts, acne, eczema or change in skin lesion(s). Neuro: No weakness, tremor, incoordination, spasms, paresthesia or pain. Psychiatric: Denies confusion, memory loss or sensory loss. Endo: Denies change in weight, skin or hair change.  Heme/Lymph: No excessive bleeding, bruising or enlarged lymph nodes.  Physical Exam  BP 124/84    Pulse 84    Temp (!) 97.2 F (36.2 C)    Resp 16    Ht 5' 5.5" (1.664 m)    Wt 123 lb 9.6 oz (56.1 kg)    BMI 20.26 kg/m   Appears  well nourished, well groomed  and in no distress.  Eyes: PERRLA, EOMs, conjunctiva no swelling or erythema. Sinuses: No frontal/maxillary tenderness ENT/Mouth: EAC's clear, TM's nl w/o erythema, bulging. Nares clear w/o erythema, swelling, exudates. Oropharynx clear without erythema or exudates. Oral hygiene is good. Tongue normal, non obstructing. Hearing intact.  Neck: Supple. Thyroid not palpable. Car 2+/2+ without bruits, nodes or JVD. Chest: Respirations nl with BS clear & equal w/o rales, rhonchi, wheezing or stridor.  Cor: Heart sounds soft w/ regular rate and rhythm with Ao S sys  murmur. Peripheral pulses normal and equal  without edema.  Abdomen: Soft &  bowel sounds normal. Non-tender w/o guarding, rebound, hernias, masses or organomegaly.  Lymphatics: Unremarkable.  Musculoskeletal: Full ROM all peripheral extremities, joint stability, 5/5 strength and normal gait.  Skin: Warm, dry without exposed rashes, lesions or ecchymosis apparent.  Neuro: Cranial nerves intact, reflexes equal bilaterally. Sensory-motor testing grossly intact. Tendon reflexes grossly intact.  Pysch: Alert & oriented x 3.  Insight and judgement nl & appropriate. No ideations.  Assessment and Plan:  1. Essential hypertension  - Continue medication, monitor blood pressure at home.  - Continue DASH diet.  Reminder to go to the ER if any CP,  SOB, nausea, dizziness, severe HA, changes vision/speech.  - CBC with Differential/Platelet - COMPLETE METABOLIC PANEL WITH GFR - Magnesium - TSH  2. Hyperlipidemia, mixed  - Continue diet/meds, exercise,& lifestyle modifications.  - Continue monitor periodic cholesterol/liver & renal functions   - Lipid panel - TSH  3. Abnormal glucose  - Continue diet, exercise  - Lifestyle modifications.  - Monitor appropriate labs.  -  Hemoglobin A1c - Insulin, random  4. Vitamin D deficiency  - Continue supplementation.  - VITAMIN D 25 Hydroxy   5. Hypothyroidism - TSH  6. Medication management  - CBC with Differential/Platelet - COMPLETE METABOLIC PANEL WITH GFR - Magnesium - Lipid panel - TSH - Hemoglobin A1c - Insulin, random - VITAMIN D 25 Hydroxy  7. Acute recurrent pansinusitis  - doxycycline 100 MG capsule; Take    1 capsule 2 x /day with meals for 5 days, then 1 x /day for 10 days  for Infection  Disp: 20 capsule  - dexamethasone 4 MG tablet; Take 1 tab 3 x day - 3 days, then 2 x day - 3 days, then 1 tab daily  Dispense: 20 tablet  8. Tracheitis  - doxycycline 100 MG capsule; Take    1 capsule     2 x /day     with meals for 5 days, then 1 x /day for 10 days  for Infection  Dispense: 20 capsule  -  dexamethasone  4 MG tablet; Take 1 tab 3 x day - 3 days, then 2 x day - 3 days, then 1 tab daily  Dispense: 20 tablet  - benzonatate  200 MG capsule; Take 1 perle 3 x / day to prevent cough  Dispense: 30 capsule; Refill: 1  - PHENERGAN WITH CODEINE 6.25-10 MG/5ML syrup; Take 1 or 2 teaspoonful every 4 hours as needed for Cough / Congestion  Dispense: 360 mL; Refill: 1        Discussed  regular exercise, BP monitoring, weight control to achieve/maintain BMI less than 25 and discussed med and SE's. Recommended labs to assess and monitor clinical status with further disposition pending results of labs.  I discussed the assessment and treatment plan with the patient. The patient was provided an opportunity to ask questions and all were answered. The patient agreed with the plan and demonstrated an understanding of the instructions.  I provided over 30 minutes of exam, counseling, chart review and  complex critical decision making.       The patient was advised to call back or seek an in-person evaluation if the symptoms worsen or if the condition fails to improve as anticipated.   Kirtland Bouchard, MD

## 2020-01-12 ENCOUNTER — Other Ambulatory Visit (HOSPITAL_COMMUNITY): Payer: Self-pay | Admitting: Nurse Practitioner

## 2020-01-12 ENCOUNTER — Ambulatory Visit (HOSPITAL_COMMUNITY)
Admission: RE | Admit: 2020-01-12 | Discharge: 2020-01-12 | Disposition: A | Discharge status: Home / Self Care | Payer: Medicare Other | Source: Ambulatory Visit | Attending: Pulmonary Disease | Admitting: Pulmonary Disease

## 2020-01-12 DIAGNOSIS — Z23 Encounter for immunization: Secondary | ICD-10-CM | POA: Insufficient documentation

## 2020-01-12 DIAGNOSIS — U071 COVID-19: Secondary | ICD-10-CM | POA: Diagnosis present

## 2020-01-12 DIAGNOSIS — I350 Nonrheumatic aortic (valve) stenosis: Secondary | ICD-10-CM | POA: Insufficient documentation

## 2020-01-12 DIAGNOSIS — I10 Essential (primary) hypertension: Secondary | ICD-10-CM

## 2020-01-12 LAB — COMPLETE METABOLIC PANEL WITH GFR
AG Ratio: 2.4 (calc) (ref 1.0–2.5)
ALT: 18 U/L (ref 6–29)
AST: 24 U/L (ref 10–35)
Albumin: 4.4 g/dL (ref 3.6–5.1)
Alkaline phosphatase (APISO): 103 U/L (ref 37–153)
BUN: 8 mg/dL (ref 7–25)
CO2: 27 mmol/L (ref 20–32)
Calcium: 9.3 mg/dL (ref 8.6–10.4)
Chloride: 101 mmol/L (ref 98–110)
Creat: 0.83 mg/dL (ref 0.60–0.93)
GFR, Est African American: 83 mL/min/{1.73_m2} (ref >=60)
GFR, Est Non African American: 71 mL/min/{1.73_m2} (ref >=60)
Globulin: 1.8 g/dL (calc) — ABNORMAL LOW (ref 1.9–3.7)
Glucose, Bld: 96 mg/dL (ref 65–99)
Potassium: 3.9 mmol/L (ref 3.5–5.3)
Sodium: 141 mmol/L (ref 135–146)
Total Bilirubin: 0.5 mg/dL (ref 0.2–1.2)
Total Protein: 6.2 g/dL (ref 6.1–8.1)

## 2020-01-12 LAB — LIPID PANEL
Cholesterol: 207 mg/dL — ABNORMAL HIGH (ref <200)
HDL: 115 mg/dL (ref >=50)
LDL Cholesterol (Calc): 74 mg/dL (calc)
Non-HDL Cholesterol (Calc): 92 mg/dL (calc) (ref <130)
Total CHOL/HDL Ratio: 1.8 (calc) (ref <5.0)
Triglycerides: 94 mg/dL (ref <150)

## 2020-01-12 LAB — CBC WITH DIFFERENTIAL/PLATELET
Absolute Monocytes: 741 cells/uL (ref 200–950)
Basophils Absolute: 51 cells/uL (ref 0–200)
Basophils Relative: 0.9 %
Eosinophils Absolute: 29 cells/uL (ref 15–500)
Eosinophils Relative: 0.5 %
HCT: 39.7 % (ref 35.0–45.0)
Hemoglobin: 13.1 g/dL (ref 11.7–15.5)
Lymphs Abs: 1254 cells/uL (ref 850–3900)
MCH: 31.7 pg (ref 27.0–33.0)
MCHC: 33 g/dL (ref 32.0–36.0)
MCV: 96.1 fL (ref 80.0–100.0)
MPV: 10.1 fL (ref 7.5–12.5)
Monocytes Relative: 13 %
Neutro Abs: 3625 cells/uL (ref 1500–7800)
Neutrophils Relative %: 63.6 %
Platelets: 146 10*3/uL (ref 140–400)
RBC: 4.13 10*6/uL (ref 3.80–5.10)
RDW: 11.8 % (ref 11.0–15.0)
Total Lymphocyte: 22 %
WBC: 5.7 10*3/uL (ref 3.8–10.8)

## 2020-01-12 LAB — HEMOGLOBIN A1C
Hgb A1c MFr Bld: 5.2 % of total Hgb (ref <5.7)
Mean Plasma Glucose: 103 (calc)
eAG (mmol/L): 5.7 (calc)

## 2020-01-12 LAB — VITAMIN D 25 HYDROXY (VIT D DEFICIENCY, FRACTURES): Vit D, 25-Hydroxy: 55 ng/mL (ref 30–100)

## 2020-01-12 LAB — INSULIN, RANDOM: Insulin: 7.3 u[IU]/mL

## 2020-01-12 LAB — MAGNESIUM: Magnesium: 1.7 mg/dL (ref 1.5–2.5)

## 2020-01-12 LAB — TSH: TSH: 1.05 mIU/L (ref 0.40–4.50)

## 2020-01-12 MED ORDER — SODIUM CHLORIDE 0.9 % IV SOLN
1200.0000 mg | Freq: Once | INTRAVENOUS | Status: AC
  Administered 2020-01-12: 1200 mg via INTRAVENOUS

## 2020-01-12 MED ORDER — DIPHENHYDRAMINE HCL 50 MG/ML IJ SOLN: 50.0000 mg | Freq: Once | INTRAMUSCULAR | Status: DC | PRN

## 2020-01-12 MED ORDER — FAMOTIDINE IN NACL 20-0.9 MG/50ML-% IV SOLN: 20.0000 mg | Freq: Once | INTRAVENOUS | Status: DC | PRN

## 2020-01-12 MED ORDER — EPINEPHRINE 0.3 MG/0.3ML IJ SOAJ: 0.3000 mg | Freq: Once | INTRAMUSCULAR | Status: DC | PRN

## 2020-01-12 MED ORDER — SODIUM CHLORIDE 0.9 % IV SOLN: INTRAVENOUS | Status: DC | PRN

## 2020-01-12 MED ORDER — METHYLPREDNISOLONE SODIUM SUCC 125 MG IJ SOLR: 125.0000 mg | Freq: Once | INTRAMUSCULAR | Status: DC | PRN

## 2020-01-12 MED ORDER — ALBUTEROL SULFATE HFA 108 (90 BASE) MCG/ACT IN AERS: 2.0000 | INHALATION_SPRAY | Freq: Once | RESPIRATORY_TRACT | Status: DC | PRN

## 2020-01-12 NOTE — Progress Notes (Signed)
========================================================== -   Test results slightly outside the reference range are not unusual. If there is anything important, I will review this with you,  otherwise it is considered normal test values.  If you have further questions,  please do not hesitate to contact me at the office or via My Chart.  ==========================================================  -   Magnesium  -    1.7    -  very  low- goal is betw 2.0 - 2.5,   - So..............Marland Kitchen  Recommend that you take  Magnesium 500 mg tablet daily   - also important to eat lots of  leafy green vegetables   - spinach - Kale - collards - greens - okra - asparagus  - broccoli - quinoa - squash - almonds   - black, red, white beans  -  peas - green beans ==========================================================  -  Chol = 207  is OK since have such a super high level of  good HDL Chol of 115  - and Bad LDL Chol = 74 - is Low   - So  Excellent !  - Very low risk for Heart Attack  / Stroke =============================================================  - A1c - is Normal - Great - No Diabetes ! ==========================================================  -  Vitamin D = 55 - slightly Low   - Vitamin D goal is between 70-100.   - Please make sure that you are taking your Vitamin D as directed.   - It is very important as a natural anti-inflammatory and helping the  immune system protect against viral infections, like the Covid-19    helping hair, skin, and nails, as well as reducing stroke and  heart attack risk.   - It helps your bones and helps with mood.  - It also decreases numerous cancer risks so please take  it as directed.   - Low Vit D is associated with a 200-300% higher risk for  CANCER   and 200-300% higher risk for HEART   ATTACK  &  STROKE.    - It is also associated with higher death rate at younger ages,   autoimmune diseases like Rheumatoid arthritis, Lupus,   Multiple Sclerosis.     - Also many other serious conditions, like depression, Alzheimer's  Dementia, infertility, muscle aches, fatigue, fibromyalgia  - just to name a few.  ==========================================================  - All Else - CBC - Kidneys - Electrolytes - Liver - Magnesium & Thyroid    - all  Normal / OK ==========================================================

## 2020-01-12 NOTE — Progress Notes (Signed)
  Diagnosis: COVID-19  Physician: Dr. Joya Gaskins  Procedure: [facility] Med: casirivimab\imdevimab infusion - Provided patient with casirivimab\imdevimab fact sheet for patients, parents and caregivers prior to infusion.  Complications: No immediate complications noted.  Discharge: Discharged home   Ludwig Lean 01/12/2020

## 2020-01-12 NOTE — Progress Notes (Signed)
I connected by phone with Kathryn Chavez on 01/12/2020 at 11:36 AM to discuss the potential use of a new treatment for mild to moderate COVID-19 viral infection in non-hospitalized patients.  This patient is a 69 y.o. female that meets the FDA criteria for Emergency Use Authorization of COVID monoclonal antibody casirivimab/imdevimab or bamlanivimab/eteseviamb.  Has a (+) direct SARS-CoV-2 viral test result  Has mild or moderate COVID-19   Is NOT hospitalized due to COVID-19  Is within 10 days of symptom onset  Has at least one of the high risk factor(s) for progression to severe COVID-19 and/or hospitalization as defined in EUA.  Specific high risk criteria : Older age (>/= 70 yo) and Cardiovascular disease or hypertension   I have spoken and communicated the following to the patient or parent/caregiver regarding COVID monoclonal antibody treatment:  1. FDA has authorized the emergency use for the treatment of mild to moderate COVID-19 in adults and pediatric patients with positive results of direct SARS-CoV-2 viral testing who are 9 years of age and older weighing at least 40 kg, and who are at high risk for progressing to severe COVID-19 and/or hospitalization.  2. The significant known and potential risks and benefits of COVID monoclonal antibody, and the extent to which such potential risks and benefits are unknown.  3. Information on available alternative treatments and the risks and benefits of those alternatives, including clinical trials.  4. Patients treated with COVID monoclonal antibody should continue to self-isolate and use infection control measures (e.g., wear mask, isolate, social distance, avoid sharing personal items, clean and disinfect "high touch" surfaces, and frequent handwashing) according to CDC guidelines.   5. The patient or parent/caregiver has the option to accept or refuse COVID monoclonal antibody treatment.  After reviewing this information with the  patient, the patient has agreed to receive one of the available covid 19 monoclonal antibodies and will be provided an appropriate fact sheet prior to infusion. Adele Dan, NP 01/12/2020 11:36 AM

## 2020-01-12 NOTE — Discharge Instructions (Signed)
10 Things You Can Do to Manage Your COVID-19 Symptoms at Home If you have possible or confirmed COVID-19: 1. Stay home from work and school. And stay away from other public places. If you must go out, avoid using any kind of public transportation, ridesharing, or taxis. 2. Monitor your symptoms carefully. If your symptoms get worse, call your healthcare provider immediately. 3. Get rest and stay hydrated. 4. If you have a medical appointment, call the healthcare provider ahead of time and tell them that you have or may have COVID-19. 5. For medical emergencies, call 911 and notify the dispatch personnel that you have or may have COVID-19. 6. Cover your cough and sneezes with a tissue or use the inside of your elbow. 7. Wash your hands often with soap and water for at least 20 seconds or clean your hands with an alcohol-based hand sanitizer that contains at least 60% alcohol. 8. As much as possible, stay in a specific room and away from other people in your home. Also, you should use a separate bathroom, if available. If you need to be around other people in or outside of the home, wear a mask. 9. Avoid sharing personal items with other people in your household, like dishes, towels, and bedding. 10. Clean all surfaces that are touched often, like counters, tabletops, and doorknobs. Use household cleaning sprays or wipes according to the label instructions. [email or web address] 10/19/2018 This information is not intended to replace advice given to you by your health care provider. Make sure you discuss any questions you have with your health care provider. Document Revised: 03/23/2019 Document Reviewed: 03/23/2019 Elsevier Patient Education  [place]. What types of side effects do monoclonal antibody drugs cause?  Common side effects  In general, the more common side effects caused by monoclonal antibody drugs include: . Allergic reactions, such as hives or itching . Flu-like signs and  symptoms, including chills, fatigue, fever, and muscle aches and pains . Nausea, vomiting . Diarrhea . Skin rashes . Low blood pressure   The CDC is recommending patients who receive monoclonal antibody treatments wait at least 90 days before being vaccinated.  Currently, there are no data on the safety and efficacy of mRNA COVID-19 vaccines in persons who received monoclonal antibodies or convalescent plasma as part of COVID-19 treatment. Based on the estimated half-life of such therapies as well as evidence suggesting that reinfection is uncommon in the 90 days after initial infection, vaccination should be deferred for at least 90 days, as a precautionary measure until additional information becomes available, to avoid interference of the antibody treatment with vaccine-induced immune responses.    10 Things You Can Do to Manage Your COVID-19 Symptoms at Home If you have possible or confirmed COVID-19: 11. Stay home from work and school. And stay away from other public places. If you must go out, avoid using any kind of public transportation, ridesharing, or taxis. 12. Monitor your symptoms carefully. If your symptoms get worse, call your healthcare provider immediately. 13. Get rest and stay hydrated. 14. If you have a medical appointment, call the healthcare provider ahead of time and tell them that you have or may have COVID-19. 15. For medical emergencies, call 911 and notify the dispatch personnel that you have or may have COVID-19. 16. Cover your cough and sneezes with a tissue or use the inside of your elbow. 37. Wash your hands often with soap and water for at least 20 seconds or clean your hands with an alcohol-based  hand sanitizer that contains at least 60% alcohol. 18. As much as possible, stay in a specific room and away from other people in your home. Also, you should use a separate bathroom, if available. If you need to be around other people in or outside of the home, wear a  mask. 19. Avoid sharing personal items with other people in your household, like dishes, towels, and bedding. 20. Clean all surfaces that are touched often, like counters, tabletops, and doorknobs. Use household cleaning sprays or wipes according to the label instructions. [email or web address] 10/19/2018 This information is not intended to replace advice given to you by your health care provider. Make sure you discuss any questions you have with your health care provider. Document Revised: 03/23/2019 Document Reviewed: 03/23/2019 Elsevier Patient Education  [place].  What types of side effects do monoclonal antibody drugs cause?  Common side effects  In general, the more common side effects caused by monoclonal antibody drugs include: . Allergic reactions, such as hives or itching . Flu-like signs and symptoms, including chills, fatigue, fever, and muscle aches and pains . Nausea, vomiting . Diarrhea . Skin rashes . Low blood pressure   The CDC is recommending patients who receive monoclonal antibody treatments wait at least 90 days before being vaccinated.  Currently, there are no data on the safety and efficacy of mRNA COVID-19 vaccines in persons who received monoclonal antibodies or convalescent plasma as part of COVID-19 treatment. Based on the estimated half-life of such therapies as well as evidence suggesting that reinfection is uncommon in the 90 days after initial infection, vaccination should be deferred for at least 90 days, as a precautionary measure until additional information becomes available, to avoid interference of the antibody treatment with vaccine-induced immune responses.

## 2020-01-12 NOTE — Progress Notes (Signed)
  Diagnosis: COVID-19  Physician: Dr Joya Gaskins   Procedure: [facility] Med: casirivimab\imdevimab infusion - Provided patient with casirivimab\imdevimab fact sheet for patients, parents and caregivers prior to infusion.  Complications: No immediate complications noted.  Discharge: Discharged home   [facility] 01/12/2020

## 2020-01-14 ENCOUNTER — Other Ambulatory Visit (HOSPITAL_COMMUNITY): Payer: Self-pay

## 2020-02-09 ENCOUNTER — Other Ambulatory Visit: Payer: Self-pay | Admitting: Internal Medicine

## 2020-02-09 MED ORDER — TRAZODONE HCL 150 MG PO TABS: ORAL_TABLET | ORAL | 0 refills | Status: DC

## 2020-02-12 ENCOUNTER — Other Ambulatory Visit: Payer: Self-pay | Admitting: Internal Medicine

## 2020-02-12 DIAGNOSIS — F418 Other specified anxiety disorders: Secondary | ICD-10-CM

## 2020-02-27 DIAGNOSIS — L821 Other seborrheic keratosis: Secondary | ICD-10-CM | POA: Diagnosis not present

## 2020-02-27 DIAGNOSIS — L2089 Other atopic dermatitis: Secondary | ICD-10-CM | POA: Diagnosis not present

## 2020-02-27 DIAGNOSIS — L814 Other melanin hyperpigmentation: Secondary | ICD-10-CM | POA: Diagnosis not present

## 2020-02-27 DIAGNOSIS — D0439 Carcinoma in situ of skin of other parts of face: Secondary | ICD-10-CM | POA: Diagnosis not present

## 2020-02-27 DIAGNOSIS — D485 Neoplasm of uncertain behavior of skin: Secondary | ICD-10-CM | POA: Diagnosis not present

## 2020-02-27 DIAGNOSIS — Z85828 Personal history of other malignant neoplasm of skin: Secondary | ICD-10-CM | POA: Diagnosis not present

## 2020-02-27 DIAGNOSIS — D0471 Carcinoma in situ of skin of right lower limb, including hip: Secondary | ICD-10-CM | POA: Diagnosis not present

## 2020-02-27 DIAGNOSIS — D044 Carcinoma in situ of skin of scalp and neck: Secondary | ICD-10-CM | POA: Diagnosis not present

## 2020-02-27 DIAGNOSIS — L570 Actinic keratosis: Secondary | ICD-10-CM | POA: Diagnosis not present

## 2020-03-22 NOTE — Progress Notes (Signed)
Cardiology Office Note    Date:  03/26/2020   ID:  [name], [name] November 04, 1950, MRN [id number]  PCP:  Unk Pinto, MD  Cardiologist:  Dr. Johnsie Cancel   Chief Complaint: Aortic Stenosis   History of Present Illness:   Kathryn Chavez is a 69 y.o. 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 Allergy    rhinitis  . Aortic stenosis, moderate    on echo in 2015.  [facility] ([place])    breast  . Hyperlipidemia   . Hypertension   . Personal history of chemotherapy 2000  . Personal history of radiation therapy 2000    Past Surgical History:  Procedure Laterality Date  . ABDOMINAL HYSTERECTOMY    . BREAST LUMPECTOMY Right 2000   malignant  . CYSTOCELE REPAIR N/A 05/12/2018    Current Medications: Prior to Admission medications   Medication Sig Start Date End Date Taking? Authorizing Provider  ALPRAZolam (XANAX XR) 0.5 MG 24 hr tablet Take 1 tablet (0.5 mg total) by mouth daily. 08/20/16   Vicie Mutters, PA-C  azelastine (ASTELIN) 0.1 % nasal spray Place 2 sprays into both nostrils 2 (two) times daily. Use in each nostril as directed 12/02/15 12/01/16  Forcucci, Courtney, PA-C  fluticasone (FLONASE) 50 MCG/ACT nasal spray Place 2 sprays into both nostrils daily. 05/26/16   Forcucci, Courtney, PA-C  gabapentin (NEURONTIN) 100 MG capsule TAKE (1) CAPSULE THREE TIMES DAILY. 08/19/16    Vicie Mutters, PA-C    Allergies:   Influenza vaccines, Other, Penicillamine, Penicillins, Sulfamethoxazole, and Sulfonamide derivatives   Social History   Socioeconomic History  . Marital status: Single    Spouse name: Not on file  . Number of children: Not on file  . Years of education: Not on file  . Highest education level: Not on file  Occupational History  . Not on file  Tobacco Use  . Smoking status: Never Smoker  . Smokeless tobacco: Never Used  Vaping Use  . Vaping Use: Never used  Substance and Sexual Activity  . Alcohol use: No    Comment: social  . Drug use: No  . Sexual activity: Yes    Partners: Male    Birth control/protection: Surgical  Other Topics Concern  . Not on file  Social History Narrative  . Not on file   Social Determinants of Health   Financial Resource Strain:   . Difficulty of Paying Living Expenses: Not on file  Food Insecurity:   . Worried About Charity fundraiser in the Last Year: Not on file  . Ran Out of Food in the Last Year: Not on file  Transportation Needs:   . Lack of Transportation (Medical): Not on file  . Lack of Transportation (Non-Medical): Not on file  Physical Activity:   . Days of Exercise per Week: Not on  file  . Minutes of Exercise per Session: Not on file  Stress:   . Feeling of Stress : Not on file  Social Connections:   . Frequency of Communication with Friends and Family: Not on file  . Frequency of Social Gatherings with Friends and Family: Not on file  . Attends Religious Services: Not on file  . Active Member of Clubs or Organizations: Not on file  . Attends Archivist Meetings: Not on file  . Marital Status: Not on file     Family History:  The patient's family history includes Arthritis in an other family member; Breast cancer in an other family member; Breast cancer (age of onset: 8) in her daughter; Breast cancer (age of onset: 58) in her daughter; Breast cancer (age of onset: 44) in her  mother; Hyperlipidemia in an other family member; Lung cancer in her mother and another family member; Other in her father; Prostate cancer in an other family member.   ROS:   Please see the history of present illness.    ROS All other systems reviewed and are negative.   PHYSICAL EXAM:   VS:  BP 112/70   Pulse 68   Ht 5' 5.5" (1.664 m)   Wt 57.6 kg   SpO2 95%   BMI 20.81 kg/m    Affect appropriate Healthy:  appears stated age HEENT: normal Neck supple with no adenopathy JVP normal no bruits no thyromegaly Lungs clear with no wheezing and good diaphragmatic motion Heart:  S1/S2 AS  murmur, no rub, gallop or click PMI normal Abdomen: benighn, BS positve, no tenderness, no AAA no bruit.  No HSM or HJR Distal pulses intact with no bruits No edema Neuro non-focal Skin warm and dry No muscular weakness   Wt Readings from Last 3 Encounters:  03/26/20 57.6 kg  01/11/20 56.1 kg  10/18/19 56.2 kg      Studies/Labs Reviewed:   EKG:  EKG is ordered today.  SR Rate 60 ICRBBB low voltage   Recent Labs: 01/11/2020: ALT 18; BUN 8; Creat 0.83; Hemoglobin 13.1; Magnesium 1.7; Platelets 146; Potassium 3.9; Sodium 141; TSH 1.05   Lipid Panel    Component Value Date/Time   CHOL 207 (H) 01/11/2020 0924   TRIG 94 01/11/2020 0924   HDL 115 01/11/2020 0924   CHOLHDL 1.8 01/11/2020 0924   VLDL 24 09/09/2016 1017   LDLCALC 74 01/11/2020 0924   LDLDIRECT 73.3 04/30/2011 0954      ASSESSMENT & PLAN:    1. Moderate aortic stenosis - now with some AR EF normal f/u TTE ordered asymptomatic   2. Hypertension - Stable and well controlled without any medical regimen.  3. Hyperlipidemia - She is not on any medication. Recent lipid profile was normal.  4. COVID:  Infection September 2021 received monoclonal infusion  And first 2 vaccines will wait on booster normal lung exam  5. Thyroid:  On replacement TSH normal 01/11/20     Jenkins Rouge

## 2020-03-26 ENCOUNTER — Ambulatory Visit: Payer: PPO | Admitting: Cardiovascular Disease

## 2020-03-26 ENCOUNTER — Encounter: Payer: Self-pay | Admitting: Cardiovascular Disease

## 2020-03-26 ENCOUNTER — Other Ambulatory Visit: Payer: Self-pay

## 2020-03-26 VITALS — BP 112/70 | HR 68 | Ht 65.5 in | Wt 127.0 lb

## 2020-03-26 DIAGNOSIS — I351 Nonrheumatic aortic (valve) insufficiency: Secondary | ICD-10-CM

## 2020-03-26 DIAGNOSIS — I350 Nonrheumatic aortic (valve) stenosis: Secondary | ICD-10-CM

## 2020-03-26 NOTE — Patient Instructions (Addendum)
Medication Instructions:  *If you need a refill on your cardiac medications before your next appointment, please call your pharmacy*  Lab Work: If you have labs (blood work) drawn today and your tests are completely normal, you will receive your results only by: . MyChart Message (if you have MyChart) OR . A paper copy in the mail If you have any lab test that is abnormal or we need to change your treatment, we will call you to review the results.  Testing/Procedures: Your physician has requested that you have an echocardiogram. Echocardiography is a painless test that uses sound waves to create images of your heart. It provides your doctor with information about the size and shape of your heart and how well your heart's chambers and valves are working. This procedure takes approximately one hour. There are no restrictions for this procedure.  Follow-Up: At [facility], you and your health needs are our priority.  As part of our continuing mission to provide you with exceptional heart care, we have created designated Provider Care Teams.  These Care Teams include your primary Cardiologist (physician) and Advanced Practice Providers (APPs -  Physician Assistants and Nurse Practitioners) who all work together to provide you with the care you need, when you need it.  We recommend signing up for the patient portal called "MyChart".  Sign up information is provided on this After Visit Summary.  MyChart is used to connect with patients for Virtual Visits (Telemedicine).  Patients are able to view lab/test results, encounter notes, upcoming appointments, etc.  Non-urgent messages can be sent to your provider as well.   To learn more about what you can do with MyChart, go to [email or web address].    Your next appointment:   12 month(s)  The format for your next appointment:   In Person  Provider:   You may see Peter Nishan, MD or one of the following Advanced Practice Providers on your  designated Care Team:    Lori Gerhardt, NP  Laura Ingold, NP  Jill McDaniel, NP  

## 2020-04-17 ENCOUNTER — Ambulatory Visit: Payer: PPO | Admitting: Adult Health Nurse Practitioner

## 2020-04-17 NOTE — Progress Notes (Deleted)
MEDICARE ANNUAL WELLNESS VISIT AND FOLLOW UP  Assessment:   Encounter for Medicare annual wellness exam 1 year  Essential hypertension At goal without medications at this time Monitor blood pressure at home; call if consistently over 130/80 Continue DASH diet.   Reminder to go to the ER if any CP, SOB, nausea, dizziness, severe HA, changes vision/speech, left arm numbness and tingling and jaw pain.  Mixed hyperlipidemia Continue low cholesterol diet and exercise.  Check lipid panel.   BMI 22.0-22.9, adult Continue to recommend diet heavy in fruits and veggies and low in animal meats, cheeses, and dairy products, appropriate calorie intake Discuss exercise recommendations routinely Continue to monitor weight at each visit  Aortic stenosis, moderate Followed by Dr. Johnsie Cancel  Seasonal allergic rhinitis, unspecified trigger - Allegra OTC, increase H20, allergy hygiene explained.  Vitamin D deficient osteomalacia Vitamin D at goal at recent check; continue to recommend supplementation for goal of 70-100 Defer vitamin D level  Female cystocele Doing much better after surgery  Depression/anxiety Doing well with wellbutrin, continue  Osteopenia Continue vitamin D, add weight bearing exercises.  Get with MGM this year  Encounter for hepatitis C screening test for low risk patient -     Hepatitis C antibody  Epigastric pain -     Amylase -     Lipase - stop wine, no NSAIDS, no supplments per patient - continue nexium as needed - if not better may refer to GI for EGD - no RUQ pain   Over 30 minutes of exam, counseling, chart review and critical decision making was performed Declines labs, will get next OV Future Appointments  Date Time Provider [place]  04/17/2020  4:00 PM Garnet Sierras, NP [facility] None  04/25/2020  7:35 AM [id number]  07/22/2020  3:00 PM Garnet Sierras, NP [facility] None  10/28/2020  9:30 AM Garnet Sierras, NP [facility] None     Plan:   During the course of the visit the patient was educated and counseled about appropriate screening and preventive services including:    Pneumococcal vaccine   Prevnar 13  Influenza vaccine  Td vaccine  Screening electrocardiogram  Bone densitometry screening  Colorectal cancer screening  Diabetes screening  Glaucoma screening  Nutrition counseling   Advanced directives: requested   Subjective:  Kathryn Chavez is a 69 y.o. female who presents for Medicare Annual Wellness Visit and 3 month follow up.   She also has significant hx/o R lumpectomy in 2001 treated with Chemoradiation and followed with 5 years of Arimidex. She is followed by Dr. Johnsie Cancel for moderate aortic stenosis. 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    She has been having stomach issues, last visit given nexium and dicyclomine. She was having upper AB pain with any food.  She is normally constipated since her rectocele repair.  She has been taking motrin but very rarely- took after a fishing trip 2 weeks ago. She does drink wine 2-3 x a week.   She is doing very well on the wellbutrin, she very rarely takes xanax.   BMI is There is no height or weight on file to calculate BMI., she has been working on diet and exercise. Wt Readings from Last 3 Encounters:  03/26/20 127 lb (57.6 kg)  01/11/20 123 lb 9.6 oz (56.1 kg)  10/18/19 124 lb (56.2 kg)    Her blood  pressure has been controlled at home, today their BP is   She does workout. She denies chest pain, shortness of breath, dizziness.   She is on cholesterol medication, increased to 1 tablet of the crestor last visit and denies myalgias. Her cholesterol is not at goal. The cholesterol last visit was:   Lab Results  Component Value Date   CHOL 207 (H) 01/11/2020   HDL 115 01/11/2020   LDLCALC 74 01/11/2020    LDLDIRECT 73.3 04/30/2011   TRIG 94 01/11/2020   CHOLHDL 1.8 01/11/2020    Last A1C in the office was:  Lab Results  Component Value Date   HGBA1C 5.2 01/11/2020   Last GFR: Lab Results  Component Value Date   GFRNONAA 71 01/11/2020   Patient is on Vitamin D supplement and at goal at recent check:    Lab Results  Component Value Date   VD25OH 55 01/11/2020      Medication Review: Current Outpatient Medications on File Prior to Visit  Medication Sig Dispense Refill  . ALPRAZolam (XANAX) 0.5 MG tablet Take 1/2-1 tablet 1 to 2 x /week if needed for Anxiety or Sleep & Limit to 5 days /week 30 tablet 0  . aspirin EC 81 MG tablet Take 81 mg by mouth daily.    Marland Kitchen buPROPion (WELLBUTRIN XL) 300 MG 24 hr tablet Take     1 tablet      Daily       for Mood , Focus & Concentration 90 tablet 0  . clindamycin (CLEOCIN) 150 MG capsule Take 150 mg by mouth as needed. For dental appts    . fluticasone (FLONASE) 50 MCG/ACT nasal spray Place 2 sprays into both nostrils daily. 16 g 0  . gabapentin (NEURONTIN) 100 MG capsule Take 1 capsule 3 x /day for Pain 270 capsule 3  . levothyroxine (SYNTHROID) 50 MCG tablet Take 1 tablet daily on an empty stomach with only water for 30 minutes & no Antacid meds, Calcium or Magnesium for 4 hours & avoid Biotin 90 tablet 3  . rosuvastatin (CRESTOR) 5 MG tablet Take 1 tablet Daily for Cholesterol 90 tablet 3   No current facility-administered medications on file prior to visit.    Allergies  Allergen Reactions  . Influenza Vaccines Nausea And Vomiting and Other (See Comments)    dizziness  . Other Nausea And Vomiting and Other (See Comments)    dizziness  . Penicillamine Hives  . Penicillins     REACTION: Hives  . Sulfamethoxazole Hives  . Sulfonamide Derivatives     REACTION: Hives    Current Problems (verified) Patient Active Problem List   Diagnosis Date Noted  . Hypothyroidism 10/02/2019  . Osteopenia 11/04/2017  . Onychomycosis of great toe  09/08/2017  . Aortic stenosis, moderate 09/09/2016  . Hyperlipidemia 01/16/2015  . Abnormal ECG 01/23/2014  . Female cystocele 02/09/2011  . Vitamin D deficient osteomalacia 10/28/2010  . Essential hypertension 12/16/2009  . Allergic rhinitis 12/16/2009    Screening Tests Immunization History  Administered Date(s) Administered  . Influenza Split 02/26/2011, 03/11/2012  . Influenza, High Dose Seasonal PF 01/29/2016  . Influenza,inj,Quad PF,6+ Mos 05/02/2013  . PFIZER SARS-COV-2 Vaccination 06/12/2019, 07/03/2019  . Pneumococcal Conjugate-13 08/05/2017  . Pneumococcal Polysaccharide-23 01/16/2015  . Tdap 10/28/2010   Preventative care: Last colonoscopy: 2013 due in 2 years Last mammogram: 11/2027 Last pap smear/pelvic exam: 2010, declines further, hx hysterectomy   DEXA: 10/2017 Femur Neck Left is 0.852 g/cm2 with a T-score of -1.3.  Echo 02/2018  Prior vaccinations: TD or Tdap: 2012  Influenza: 2017 Pneumococcal: 2016 Prevnar13: 2019 Shingles/Zostavax: n/a in office  Names of Other Physician/Practitioners you currently use: 1. [facility] here for primary care 2. Dr. [place], eye doctor, last visit 07/2017 3. Dr. Marland Kitchen , dentist, last visit 2018 due for follow up   Patient Care Team: Unk Pinto, MD as PCP - General (Internal Medicine) Josue Hector, MD as PCP - Cardiology (Cardiology) [place], Heather, [place] (Optometry)  SURGICAL HISTORY She  has a past surgical history that includes Abdominal hysterectomy; Cystocele repair (N/A, 05/12/2018); and Breast lumpectomy (Right, 2000). FAMILY HISTORY Her family history includes Arthritis in an other family member; Breast cancer in an other family member; Breast cancer (age of onset: 14) in her daughter; Breast cancer (age of onset: 84) in her daughter; Breast cancer (age of onset: 27) in her mother; Hyperlipidemia in an other family member; Lung cancer in her mother and another family member;  Other in her father; Prostate cancer in an other family member. SOCIAL HISTORY She  reports that she has never smoked. She has never used smokeless tobacco. She reports that she does not drink alcohol and does not use drugs.  MEDICARE WELLNESS OBJECTIVES: Physical activity:   Cardiac risk factors:   Depression/mood screen:   Depression screen [facility] 2/9 10/02/2019  Decreased Interest -  Down, Depressed, Hopeless 0  PHQ - 2 Score 0  Altered sleeping 0  Tired, decreased energy 0  Change in appetite 0  Feeling bad or failure about yourself  0  Trouble concentrating 0  Moving slowly or fidgety/restless 0  Suicidal thoughts 0  PHQ-9 Score 0  Difficult doing work/chores Not difficult at all    ADLs:  In your present state of health, do you have any difficulty performing the following activities: 10/02/2019 06/26/2019  Hearing? N N  Comment some difficulty, no hearing aids yet -  Vision? N N  Difficulty concentrating or making decisions? N N  Walking or climbing stairs? N N  Dressing or bathing? N N  Doing errands, shopping? N N  Some recent data might be hidden     Cognitive Testing  Alert? Yes  Normal Appearance?Yes  Oriented to person? Yes  Place? Yes   Time? Yes  Recall of three objects?  Yes  Can perform simple calculations? Yes  Displays appropriate judgment?Yes  Can read the correct time from a watch face?Yes  EOL planning:    Review of Systems  Constitutional: Negative for malaise/fatigue and weight loss.  HENT: Negative for hearing loss and tinnitus.   Eyes: Negative for blurred vision and double vision.  Respiratory: Negative for cough, sputum production, shortness of breath and wheezing.   Cardiovascular: Negative for chest pain, palpitations, orthopnea, claudication, leg swelling and PND.  Gastrointestinal: Negative for abdominal pain, blood in stool, constipation, diarrhea, heartburn, melena, nausea and vomiting.  Genitourinary: Negative.   Musculoskeletal:  Negative for falls, joint pain and myalgias.  Skin: Negative for rash.  Neurological: Negative for dizziness, tingling, sensory change, weakness and headaches.  Endo/Heme/Allergies: Negative for polydipsia.  Psychiatric/Behavioral: Negative.  Negative for depression, memory loss, substance abuse and suicidal ideas. The patient is not nervous/anxious and does not have insomnia.   All other systems reviewed and are negative.    Objective:     There were no vitals filed for this visit. There is no height or weight on file to calculate BMI.  General appearance: alert, no distress, WD/WN,  female HEENT: normocephalic, sclerae anicteric, TMs pearly, nares patent, no discharge or erythema, pharynx normal Oral cavity: MMM, no lesions Neck: supple, no lymphadenopathy, no thyromegaly, no masses Heart: RRR, normal S1, S2, systolic murmur radiation to the carotids. Lungs: CTA bilaterally, no wheezes, rhonchi, or rales Abdomen: +bs, soft, non tender, non distended, no masses, no hepatomegaly, no splenomegaly Musculoskeletal: nontender, no swelling, no obvious deformity Extremities: no edema, no cyanosis, no clubbing Pulses: 2+ symmetric, upper and lower extremities, normal cap refill Neurological: alert, oriented x 3, CN2-12 intact, strength normal upper extremities and lower extremities, sensation normal throughout, DTRs 2+ throughout, no cerebellar signs, gait normal Psychiatric: normal affect, behavior normal, pleasant   Medicare Attestation I have personally reviewed: The patient's medical and social history Their use of alcohol, tobacco or illicit drugs Their current medications and supplements The patient's functional ability including ADLs,fall risks, home safety risks, cognitive, and hearing and visual impairment Diet and physical activities Evidence for depression or mood disorders  The patient's weight, height, BMI, and visual acuity have been recorded in the chart.  I have made  referrals, counseling, and provided education to the patient based on review of the above and I have provided the patient with a written personalized care plan for preventive services.     Elder Negus, NP   04/17/2020

## 2020-04-18 DIAGNOSIS — D0439 Carcinoma in situ of skin of other parts of face: Secondary | ICD-10-CM | POA: Diagnosis not present

## 2020-04-18 DIAGNOSIS — Z85828 Personal history of other malignant neoplasm of skin: Secondary | ICD-10-CM | POA: Diagnosis not present

## 2020-04-21 NOTE — Progress Notes (Deleted)
FOLLOW UP 3 MONTH  Assessment:    Essential hypertension At goal without medications at this time Monitor blood pressure at home; call if consistently over 130/80 Continue DASH diet.   Reminder to go to the ER if any CP, SOB, nausea, dizziness, severe HA, changes vision/speech, left arm numbness and tingling and jaw pain.  Mixed hyperlipidemia Continue low cholesterol diet and exercise.  Check lipid panel.   BMI 22.0-22.9, adult Continue to recommend diet heavy in fruits and veggies and low in animal meats, cheeses, and dairy products, appropriate calorie intake Discuss exercise recommendations routinely Continue to monitor weight at each visit  Aortic stenosis, moderate Followed by Dr. Johnsie Cancel  Seasonal allergic rhinitis, unspecified trigger - Allegra OTC, increase H20, allergy hygiene explained.  Vitamin D deficient osteomalacia Vitamin D at goal at recent check; continue to recommend supplementation for goal of 70-100 Defer vitamin D level  Female cystocele Doing much better after surgery  Depression/anxiety Doing well with wellbutrin, continue  Osteopenia Continue vitamin D, add weight bearing exercises.  Get with MGM this year  Medication Management Continued   Further disposition pending results if labs check today. Discussed med's effects and SE's.   Over 30 minutes of face to face interview, exam, counseling, chart review, and critical decision making was performed.    Future Appointments  Date Time Provider [place]  04/22/2020  8:45 AM Garnet Sierras, NP [facility] None  04/25/2020  7:35 AM [facility]  07/22/2020  3:00 PM Garnet Sierras, NP [facility] None  10/28/2020  9:30 AM Dabney Schanz, D[place] Sewer, NP [facility] None     Plan:   During the course of the visit the patient was educated and counseled about appropriate screening and preventive services including:    Pneumococcal vaccine   Prevnar 13  Influenza  vaccine  Td vaccine  Screening electrocardiogram  Bone densitometry screening  Colorectal cancer screening  Diabetes screening  Glaucoma screening  Nutrition counseling   Advanced directives: requested   Subjective:  Kathryn Chavez is a 70 y.o. female who presents for Medicare Annual Wellness Visit and 3 month follow up.   She also has significant hx/o R lumpectomy in 2001 treated with Chemoradiation and followed with 5 years of Arimidex. She is followed by Dr. Johnsie Cancel for moderate aortic stenosis and recent OV on 03/26/20.  Personal history of COVID 12/2019 and single monoclonal infusion, she is vaccinated x2.   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    She has been having stomach issues, last visit given nexium and dicyclomine. She was having upper AB pain with any food.  She is normally constipated since her rectocele repair.  She has been taking motrin but very rarely- took after a fishing trip 2 weeks ago. She does drink wine 2-3 x a week.   She is doing very well on the wellbutrin, she very rarely takes xanax.   BMI is There is no height or weight on file to calculate BMI., she has been working on diet and exercise. Wt Readings from Last 3 Encounters:  03/26/20 127 lb (57.6 kg)  01/11/20 123 lb 9.6 oz (56.1 kg)  10/18/19 124 lb (56.2 kg)    Her blood pressure predates 1990's and has been controlled at home, today their BP is   She does workout. She denies chest pain, shortness of breath, dizziness.   She is on cholesterol  medication, increased to 1 tablet of the crestor last visit and denies myalgias. Her cholesterol is not at goal. The cholesterol last visit was:   Lab Results  Component Value Date   CHOL 207 (H) 01/11/2020   HDL 115 01/11/2020   LDLCALC 74 01/11/2020   LDLDIRECT 73.3 04/30/2011   TRIG 94 01/11/2020   CHOLHDL 1.8 01/11/2020    Last A1C in  the office was:  Lab Results  Component Value Date   HGBA1C 5.2 01/11/2020   Last GFR: Lab Results  Component Value Date   GFRNONAA 71 01/11/2020   Patient is on Vitamin D supplement and at goal at recent check:    Lab Results  Component Value Date   VD25OH 55 01/11/2020      Medication Review: Current Outpatient Medications on File Prior to Visit  Medication Sig Dispense Refill  . ALPRAZolam (XANAX) 0.5 MG tablet Take 1/2-1 tablet 1 to 2 x /week if needed for Anxiety or Sleep & Limit to 5 days /week 30 tablet 0  . aspirin EC 81 MG tablet Take 81 mg by mouth daily.    Marland Kitchen buPROPion (WELLBUTRIN XL) 300 MG 24 hr tablet Take     1 tablet      Daily       for Mood , Focus & Concentration 90 tablet 0  . clindamycin (CLEOCIN) 150 MG capsule Take 150 mg by mouth as needed. For dental appts    . fluticasone (FLONASE) 50 MCG/ACT nasal spray Place 2 sprays into both nostrils daily. 16 g 0  . gabapentin (NEURONTIN) 100 MG capsule Take 1 capsule 3 x /day for Pain 270 capsule 3  . levothyroxine (SYNTHROID) 50 MCG tablet Take 1 tablet daily on an empty stomach with only water for 30 minutes & no Antacid meds, Calcium or Magnesium for 4 hours & avoid Biotin 90 tablet 3  . rosuvastatin (CRESTOR) 5 MG tablet Take 1 tablet Daily for Cholesterol 90 tablet 3   No current facility-administered medications on file prior to visit.    Allergies  Allergen Reactions  . Influenza Vaccines Nausea And Vomiting and Other (See Comments)    dizziness  . Other Nausea And Vomiting and Other (See Comments)    dizziness  . Penicillamine Hives  . Penicillins     REACTION: Hives  . Sulfamethoxazole Hives  . Sulfonamide Derivatives     REACTION: Hives    Current Problems (verified) Patient Active Problem List   Diagnosis Date Noted  . Hypothyroidism 10/02/2019  . Osteopenia 11/04/2017  . Onychomycosis of great toe 09/08/2017  . Aortic stenosis, moderate 09/09/2016  . Hyperlipidemia 01/16/2015  . Abnormal  ECG 01/23/2014  . Female cystocele 02/09/2011  . Vitamin D deficient osteomalacia 10/28/2010  . Essential hypertension 12/16/2009  . Allergic rhinitis 12/16/2009    Screening Tests Immunization History  Administered Date(s) Administered  . Influenza Split 02/26/2011, 03/11/2012  . Influenza, High Dose Seasonal PF 01/29/2016  . Influenza,inj,Quad PF,6+ Mos 05/02/2013  . PFIZER SARS-COV-2 Vaccination 06/12/2019, 07/03/2019  . Pneumococcal Conjugate-13 08/05/2017  . Pneumococcal Polysaccharide-23 01/16/2015  . Tdap 10/28/2010   Preventative care: Last colonoscopy: 2013 due in 2 years Last mammogram: 11/2027 Last pap smear/pelvic exam: 2010, declines further, hx hysterectomy   DEXA: 10/2017 Femur Neck Left is 0.852 g/cm2 with a T-score of -1.3. Echo 02/2018  Prior vaccinations: TD or Tdap: 2012  Influenza: 2017 Pneumococcal: 2016 Prevnar13: 2019 Shingles/Zostavax: n/a in office  Names of Other Physician/Practitioners you currently use: 1.  [facility] here for primary care 2. Dr. [place], eye doctor, last visit 07/2017 3. Dr. Marland Kitchen , dentist, last visit 2018 due for follow up   Patient Care Team: Lucky Cowboy, MD as PCP - General (Internal Medicine) Wendall Stade, MD as PCP - Cardiology (Cardiology) [place], Heather, OD (Optometry)  SURGICAL HISTORY She  has a past surgical history that includes Abdominal hysterectomy; Cystocele repair (N/A, 05/12/2018); and Breast lumpectomy (Right, 2000). FAMILY HISTORY Her family history includes Arthritis in an other family member; Breast cancer in an other family member; Breast cancer (age of onset: 6) in her daughter; Breast cancer (age of onset: 30) in her daughter; Breast cancer (age of onset: 59) in her mother; Hyperlipidemia in an other family member; Lung cancer in her mother and another family member; Other in her father; Prostate cancer in an other family member. SOCIAL HISTORY She  reports that  she has never smoked. She has never used smokeless tobacco. She reports that she does not drink alcohol and does not use drugs.   Review of Systems  Constitutional: Negative for malaise/fatigue and weight loss.  HENT: Negative for hearing loss and tinnitus.   Eyes: Negative for blurred vision and double vision.  Respiratory: Negative for cough, sputum production, shortness of breath and wheezing.   Cardiovascular: Negative for chest pain, palpitations, orthopnea, claudication, leg swelling and PND.  Gastrointestinal: Negative for abdominal pain, blood in stool, constipation, diarrhea, heartburn, melena, nausea and vomiting.  Genitourinary: Negative.   Musculoskeletal: Negative for falls, joint pain and myalgias.  Skin: Negative for rash.  Neurological: Negative for dizziness, tingling, sensory change, weakness and headaches.  Endo/Heme/Allergies: Negative for polydipsia.  Psychiatric/Behavioral: Negative.  Negative for depression, memory loss, substance abuse and suicidal ideas. The patient is not nervous/anxious and does not have insomnia.   All other systems reviewed and are negative.    Objective:     There were no vitals filed for this visit. There is no height or weight on file to calculate BMI.  General appearance: alert, no distress, WD/WN, female HEENT: normocephalic, sclerae anicteric, TMs pearly, nares patent, no discharge or erythema, pharynx normal Oral cavity: MMM, no lesions Neck: supple, no lymphadenopathy, no thyromegaly, no masses Heart: RRR, normal S1, S2, systolic murmur radiation to the carotids. Lungs: CTA bilaterally, no wheezes, rhonchi, or rales Abdomen: +bs, soft, non tender, non distended, no masses, no hepatomegaly, no splenomegaly Musculoskeletal: nontender, no swelling, no obvious deformity Extremities: no edema, no cyanosis, no clubbing Pulses: 2+ symmetric, upper and lower extremities, normal cap refill Neurological: alert, oriented x 3, CN2-12 intact,  strength normal upper extremities and lower extremities, sensation normal throughout, DTRs 2+ throughout, no cerebellar signs, gait normal Psychiatric: normal affect, behavior normal, pleasant     Elder Negus, Edrick Oh, DNP  [facility] 04/21/2020  11:40 PM

## 2020-04-22 ENCOUNTER — Ambulatory Visit: Payer: PPO | Admitting: Adult Health Nurse Practitioner

## 2020-04-22 DIAGNOSIS — J302 Other seasonal allergic rhinitis: Secondary | ICD-10-CM

## 2020-04-22 DIAGNOSIS — Z6822 Body mass index (BMI) 22.0-22.9, adult: Secondary | ICD-10-CM

## 2020-04-22 DIAGNOSIS — R7309 Other abnormal glucose: Secondary | ICD-10-CM

## 2020-04-22 DIAGNOSIS — I350 Nonrheumatic aortic (valve) stenosis: Secondary | ICD-10-CM

## 2020-04-22 DIAGNOSIS — F418 Other specified anxiety disorders: Secondary | ICD-10-CM

## 2020-04-22 DIAGNOSIS — I10 Essential (primary) hypertension: Secondary | ICD-10-CM

## 2020-04-22 DIAGNOSIS — E782 Mixed hyperlipidemia: Secondary | ICD-10-CM

## 2020-04-22 DIAGNOSIS — N8110 Cystocele, unspecified: Secondary | ICD-10-CM

## 2020-04-22 DIAGNOSIS — E039 Hypothyroidism, unspecified: Secondary | ICD-10-CM

## 2020-04-22 DIAGNOSIS — M85852 Other specified disorders of bone density and structure, left thigh: Secondary | ICD-10-CM

## 2020-04-22 DIAGNOSIS — Z79899 Other long term (current) drug therapy: Secondary | ICD-10-CM

## 2020-04-22 DIAGNOSIS — E559 Vitamin D deficiency, unspecified: Secondary | ICD-10-CM

## 2020-04-25 ENCOUNTER — Other Ambulatory Visit (HOSPITAL_COMMUNITY): Payer: PPO

## 2020-05-15 ENCOUNTER — Other Ambulatory Visit: Payer: Self-pay | Admitting: Internal Medicine

## 2020-05-15 ENCOUNTER — Ambulatory Visit (HOSPITAL_COMMUNITY): Payer: PPO | Attending: Internal Medicine

## 2020-05-15 ENCOUNTER — Other Ambulatory Visit: Payer: Self-pay

## 2020-05-15 DIAGNOSIS — I350 Nonrheumatic aortic (valve) stenosis: Secondary | ICD-10-CM | POA: Insufficient documentation

## 2020-05-15 DIAGNOSIS — I351 Nonrheumatic aortic (valve) insufficiency: Secondary | ICD-10-CM | POA: Diagnosis not present

## 2020-05-15 DIAGNOSIS — E039 Hypothyroidism, unspecified: Secondary | ICD-10-CM

## 2020-05-15 DIAGNOSIS — F418 Other specified anxiety disorders: Secondary | ICD-10-CM

## 2020-05-15 LAB — ECHOCARDIOGRAM COMPLETE
AR max vel: 0.98 cm2
AV Area VTI: 1.05 cm2
AV Area mean vel: 0.93 cm2
AV Mean grad: 20 mmHg
AV Peak grad: 35.3 mmHg
Ao pk vel: 2.97 m/s
Area-P 1/2: 3.14 cm2
P 1/2 time: 642 msec
S' Lateral: 2.4 cm

## 2020-05-27 ENCOUNTER — Other Ambulatory Visit: Payer: Self-pay

## 2020-05-27 ENCOUNTER — Ambulatory Visit (INDEPENDENT_AMBULATORY_CARE_PROVIDER_SITE_OTHER): Payer: PPO | Admitting: Adult Health Nurse Practitioner

## 2020-05-27 ENCOUNTER — Encounter: Payer: Self-pay | Admitting: Adult Health Nurse Practitioner

## 2020-05-27 VITALS — BP 118/74 | HR 73 | Temp 97.7°F | Ht 65.5 in | Wt 128.0 lb

## 2020-05-27 DIAGNOSIS — I350 Nonrheumatic aortic (valve) stenosis: Secondary | ICD-10-CM

## 2020-05-27 DIAGNOSIS — R6889 Other general symptoms and signs: Secondary | ICD-10-CM

## 2020-05-27 DIAGNOSIS — Z0001 Encounter for general adult medical examination with abnormal findings: Secondary | ICD-10-CM

## 2020-05-27 DIAGNOSIS — E039 Hypothyroidism, unspecified: Secondary | ICD-10-CM

## 2020-05-27 DIAGNOSIS — E782 Mixed hyperlipidemia: Secondary | ICD-10-CM

## 2020-05-27 DIAGNOSIS — M85852 Other specified disorders of bone density and structure, left thigh: Secondary | ICD-10-CM | POA: Diagnosis not present

## 2020-05-27 DIAGNOSIS — Z79899 Other long term (current) drug therapy: Secondary | ICD-10-CM | POA: Diagnosis not present

## 2020-05-27 DIAGNOSIS — F418 Other specified anxiety disorders: Secondary | ICD-10-CM | POA: Diagnosis not present

## 2020-05-27 DIAGNOSIS — R7309 Other abnormal glucose: Secondary | ICD-10-CM

## 2020-05-27 DIAGNOSIS — I10 Essential (primary) hypertension: Secondary | ICD-10-CM

## 2020-05-27 DIAGNOSIS — N8110 Cystocele, unspecified: Secondary | ICD-10-CM

## 2020-05-27 DIAGNOSIS — E559 Vitamin D deficiency, unspecified: Secondary | ICD-10-CM | POA: Diagnosis not present

## 2020-05-27 DIAGNOSIS — Z0000 Encounter for general adult medical examination without abnormal findings: Secondary | ICD-10-CM

## 2020-05-27 NOTE — Progress Notes (Signed)
MEDICARE ANNUAL WELLNESS VISIT AND FOLLOW UP  Assessment:   Encounter for Medicare annual wellness exam 1 year  Essential hypertension At goal without medications at this time Monitor blood pressure at home; call if consistently over 130/80 Continue DASH diet.   Reminder to go to the ER if any CP, SOB, nausea, dizziness, severe HA, changes vision/speech, left arm numbness and tingling and jaw pain.  Mixed hyperlipidemia Continue low cholesterol diet and exercise.  Check lipid panel.   BMI 22.0-22.9, adult Continue to recommend diet heavy in fruits and veggies and low in animal meats, cheeses, and dairy products, appropriate calorie intake Discuss exercise recommendations routinely Continue to monitor weight at each visit  Aortic stenosis, moderate Followed by Dr. Johnsie Cancel  Seasonal allergic rhinitis, unspecified trigger - Allegra OTC, increase H20, allergy hygiene explained.  Vitamin D deficient osteomalacia Vitamin D at goal at recent check; continue to recommend supplementation for goal of 70-100 Defer vitamin D level  Female cystocele Doing much better after surgery   Depression/anxiety Doing well with wellbutrin, continue  Osteopenia, left neck femur Continue vitamin D, add weight bearing exercises.  Get with MGM this year  Medication Management Continued   Over 30 minutes of face to face interview, exam, counseling, chart review and critical decision making was performed Declines labs, will get next OV Future Appointments  Date Time Provider [place]  07/22/2020  3:00 PM Garnet Sierras, NP [facility] None  10/28/2020  9:30 AM Garnet Sierras, NP [facility] None  06/02/2021  4:00 PM Lakrisha Iseman, Danton Sewer, NP [facility] None     Plan:   During the course of the visit the patient was educated and counseled about appropriate screening and preventive services including:    Pneumococcal vaccine   Prevnar 13  Influenza vaccine  Td  vaccine  Screening electrocardiogram  Bone densitometry screening  Colorectal cancer screening  Diabetes screening  Glaucoma screening  Nutrition counseling   Advanced directives: requested   Subjective:  Kathryn Chavez is a 70 y.o. female who presents for Medicare Annual Wellness Visit and 3 month follow up.    She also has significant hx/o R lumpectomy in 2001 treated with Chemoradiation and followed with 5 years of Arimidex. She is followed by Dr. Johnsie Cancel for moderate aortic stenosis. 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    She has been having stomach issues, last visit given nexium and dicyclomine. She was having upper AB pain with any food.  She is normally constipated since her rectocele repair.  She has been taking motrin but very rarely- took after a fishing trip 2 weeks ago. She does drink wine 2-3 x a week.   She is doing very well on the wellbutrin, she very rarely takes xanax.   BMI is Body mass index is 20.98 kg/m., she has been working on diet and exercise. Wt Readings from Last 3 Encounters:  05/27/20 128 lb (58.1 kg)  03/26/20 127 lb (57.6 kg)  01/11/20 123 lb 9.6 oz (56.1 kg)    Her blood pressure has been controlled at home, today their BP is BP: 118/74 She does workout. She denies chest pain, shortness of breath, dizziness.   She is on cholesterol medication, increased to 1 tablet of the crestor last visit and denies myalgias. Her cholesterol is not at goal. The cholesterol last visit was:   Lab Results  Component Value Date   CHOL 203 (  H) 05/27/2020   HDL 105 05/27/2020   LDLCALC 79 05/27/2020   LDLDIRECT 73.3 04/30/2011   TRIG 102 05/27/2020   CHOLHDL 1.9 05/27/2020    Last A1C in the office was:  Lab Results  Component Value Date   HGBA1C 5.2 01/11/2020   Last GFR: Lab Results  Component Value Date   GFRNONAA 66 05/27/2020   Patient  is on Vitamin D supplement and at goal at recent check:    Lab Results  Component Value Date   VD25OH 55 01/11/2020      Medication Review: Current Outpatient Medications on File Prior to Visit  Medication Sig Dispense Refill  . ALPRAZolam (XANAX) 0.5 MG tablet Take 1/2-1 tablet 1 to 2 x /week if needed for Anxiety or Sleep & Limit to 5 days /week 30 tablet 0  . aspirin EC 81 MG tablet Take 81 mg by mouth daily.    Marland Kitchen buPROPion (WELLBUTRIN XL) 300 MG 24 hr tablet TAKE 1 TABLET EVERY MORNINGFOR MOOD, ANXIETY, FOCUS AND CONCENTRATION. 90 tablet 0  . clindamycin (CLEOCIN) 150 MG capsule Take 150 mg by mouth as needed. For dental appts    . fluticasone (FLONASE) 50 MCG/ACT nasal spray Place 2 sprays into both nostrils daily. 16 g 0  . gabapentin (NEURONTIN) 100 MG capsule Take 1 capsule 3 x /day for Pain 270 capsule 3  . levothyroxine (SYNTHROID) 50 MCG tablet TAKE 1 TAB IN THE AM 30 MINUTES BEFORE FOOD, NO ANTACIDS,CALCIUM,MAGNES- IUM, BIOTIN FOR 4 HOURS. 90 tablet 0  . rosuvastatin (CRESTOR) 5 MG tablet Take 1 tablet Daily for Cholesterol 90 tablet 3   No current facility-administered medications on file prior to visit.    Allergies  Allergen Reactions  . Influenza Vaccines Nausea And Vomiting and Other (See Comments)    dizziness  . Other Nausea And Vomiting and Other (See Comments)    dizziness  . Penicillamine Hives  . Penicillins     REACTION: Hives  . Sulfamethoxazole Hives  . Sulfonamide Derivatives     REACTION: Hives    Current Problems (verified) Patient Active Problem List   Diagnosis Date Noted  . Hypothyroidism 10/02/2019  . Osteopenia 11/04/2017  . Onychomycosis of great toe 09/08/2017  . Aortic stenosis, moderate 09/09/2016  . Hyperlipidemia 01/16/2015  . Abnormal ECG 01/23/2014  . Female cystocele 02/09/2011  . Vitamin D deficient osteomalacia 10/28/2010  . Essential hypertension 12/16/2009  . Allergic rhinitis 12/16/2009    Screening Tests Immunization  History  Administered Date(s) Administered  . Influenza Split 02/26/2011, 03/11/2012  . Influenza, High Dose Seasonal PF 01/29/2016  . Influenza,inj,Quad PF,6+ Mos 05/02/2013  . PFIZER(Purple Top)SARS-COV-2 Vaccination 06/12/2019, 07/03/2019  . Pneumococcal Conjugate-13 08/05/2017  . Pneumococcal Polysaccharide-23 01/16/2015  . Tdap 10/28/2010   Preventative care: Last colonoscopy: 2013 due in 2 years Last mammogram: 12/2019 Last pap smear/pelvic exam: 2010, declines further, hx hysterectomy   DEXA: 10/2017 Femur Neck Left is 0.852 g/cm2 with a T-score of -1.3. Echo 02/2018  Prior vaccinations: TD or Tdap: 2012, Due discussed with patient  Influenza: 2017 Pneumococcal: 2016 Prevnar13: 2019 Shingles/Zostavax: n/a in office Personal history of COVID: 12/2019  Names of Other Physician/Practitioners you currently use: 1. [facility] here for primary care 2. Dr. [place], eye doctor, last visit 07/2017 3. Dr. Marland Kitchen , dentist, last visit 2018 due for follow up   Patient Care Team: Unk Pinto, MD as PCP - General (Internal Medicine) Josue Hector, MD as PCP - Cardiology (  Cardiology) [place], Nira Conn, [place] (Optometry)  SURGICAL HISTORY She  has a past surgical history that includes Abdominal hysterectomy; Cystocele repair (N/A, 05/12/2018); and Breast lumpectomy (Right, 2000). FAMILY HISTORY Her family history includes Arthritis in an other family member; Breast cancer in an other family member; Breast cancer (age of onset: 52) in her daughter; Breast cancer (age of onset: 56) in her daughter; Breast cancer (age of onset: 66) in her mother; Hyperlipidemia in an other family member; Lung cancer in her mother and another family member; Other in her father; Prostate cancer in an other family member. SOCIAL HISTORY She  reports that she has never smoked. She has never used smokeless tobacco. She reports that she does not drink alcohol and does not use  drugs.  MEDICARE WELLNESS OBJECTIVES: Physical activity: Current Exercise Habits: The patient does not participate in regular exercise at present Cardiac risk factors: Cardiac Risk Factors include: advanced age (>36men, >8 women);dyslipidemia Depression/mood screen:   Depression screen [facility] 2/9 05/27/2020  Decreased Interest 0  Down, Depressed, Hopeless 0  PHQ - 2 Score 0  Altered sleeping -  Tired, decreased energy -  Change in appetite -  Feeling bad or failure about yourself  -  Trouble concentrating -  Moving slowly or fidgety/restless -  Suicidal thoughts -  PHQ-9 Score -  Difficult doing work/chores -    ADLs:  In your present state of health, do you have any difficulty performing the following activities: 05/27/2020 10/02/2019  Hearing? N N  Comment - some difficulty, no hearing aids yet  Vision? N N  Difficulty concentrating or making decisions? N N  Walking or climbing stairs? N N  Dressing or bathing? N N  Doing errands, shopping? N N  Preparing Food and eating ? N -  Using the Toilet? N -  In the past six months, have you accidently leaked urine? N -  Do you have problems with loss of bowel control? N -  Managing your Medications? N -  Managing your Finances? N -  Housekeeping or managing your Housekeeping? N -  Some recent data might be hidden     Cognitive Testing  Alert? Yes  Normal Appearance?Yes  Oriented to person? Yes  Place? Yes   Time? Yes  Recall of three objects?  Yes  Can perform simple calculations? Yes  Displays appropriate judgment?Yes  Can read the correct time from a watch face?Yes  EOL planning: Does Patient Have a Medical Advance Directive?: Yes Type of Advance Directive: [place] will Does patient want to make changes to medical advance directive?: No - Patient declined Copy of [place] in Chart?: Yes - validated most recent copy scanned in chart (See row information)  Review of Systems   Constitutional: Negative for malaise/fatigue and weight loss.  HENT: Negative for hearing loss and tinnitus.   Eyes: Negative for blurred vision and double vision.  Respiratory: Negative for cough, sputum production, shortness of breath and wheezing.   Cardiovascular: Negative for chest pain, palpitations, orthopnea, claudication, leg swelling and PND.  Gastrointestinal: Negative for abdominal pain, blood in stool, constipation, diarrhea, heartburn, melena, nausea and vomiting.  Genitourinary: Negative.   Musculoskeletal: Negative for falls, joint pain and myalgias.  Skin: Negative for rash.  Neurological: Negative for dizziness, tingling, sensory change, weakness and headaches.  Endo/Heme/Allergies: Negative for polydipsia.  Psychiatric/Behavioral: Negative.  Negative for depression, memory loss, substance abuse and suicidal ideas. The patient is not nervous/anxious and does not have insomnia.  All other systems reviewed and are negative.    Objective:     Today's Vitals   05/27/20 1615  BP: 118/74  Pulse: 73  Temp: 97.7 F (36.5 C)  SpO2: 93%  Weight: 128 lb (58.1 kg)  Height: 5' 5.5" (1.664 m)   Body mass index is 20.98 kg/m.  General appearance: alert, no distress, WD/WN, female HEENT: normocephalic, sclerae anicteric, TMs pearly, nares patent, no discharge or erythema, pharynx normal Oral cavity: MMM, no lesions Neck: supple, no lymphadenopathy, no thyromegaly, no masses Heart: RRR, normal S1, S2, systolic murmur radiation to the carotids. Lungs: CTA bilaterally, no wheezes, rhonchi, or rales Abdomen: +bs, soft, non tender, non distended, no masses, no hepatomegaly, no splenomegaly Musculoskeletal: nontender, no swelling, no obvious deformity Extremities: no edema, no cyanosis, no clubbing Pulses: 2+ symmetric, upper and lower extremities, normal cap refill Neurological: alert, oriented x 3, CN2-12 intact, strength normal upper extremities and lower extremities,  sensation normal throughout, DTRs 2+ throughout, no cerebellar signs, gait normal Psychiatric: normal affect, behavior normal, pleasant   Medicare Attestation I have personally reviewed: The patient's medical and social history Their use of alcohol, tobacco or illicit drugs Their current medications and supplements The patient's functional ability including ADLs,fall risks, home safety risks, cognitive, and hearing and visual impairment Diet and physical activities Evidence for depression or mood disorders  The patient's weight, height, BMI, and visual acuity have been recorded in the chart.  I have made referrals, counseling, and provided education to the patient based on review of the above and I have provided the patient with a written personalized care plan for preventive services.     Garnet Sierras, NP    05/27/2020

## 2020-05-27 NOTE — Patient Instructions (Addendum)
Allergy Symptoms / Runny Nose: Chose one  Loratadine (Clairatin) Take one tablet daily, non-drowsy good for itchy eyes/ears  OR  Zyrtec / Cetirizine Take 10mg  by mouth May cause drowsiness, take nightly Be sure to drink plenty of water If this is not effective, try Xyzal or Allegra  OR  Xyzal / Levocetirazine  Take 5mg  by mouth May cause drowsiness, take nightly Be sure to drink plenty of water If this is not effective try Allegra or Zyrtec  OR  Allegra / fexofenadine Take 180mg  by mouth daily If this is not effective try Zyrtec or Xyzal    *If you battle with chronic allergies you may need to change the antihistamine you currently use to find most effective.     Kathryn Chavez , Thank you for taking time to come for your Medicare Wellness Visit. I appreciate your ongoing commitment to your health goals. Please review the following plan we discussed and let me know if I can assist you in the future.  You are due for tetanus vaccination.  Should you get cut by any metal please let us know so we can get you this vaccination.   This is a list of the screening recommended for you and due dates:  Health Maintenance  Topic Date Due  . COVID-19 Vaccine (3 - Pfizer risk 4-dose series) 07/31/2019  . Tetanus Vaccine  10/27/2020  . Mammogram  12/20/2021  . Colon Cancer Screening  03/14/2022  . DEXA scan (bone density measurement)  Completed  .  Hepatitis C: One time screening is recommended by Center for Disease Control  (CDC) for  adults born from [birth year] through 1966.   Completed  . Pneumonia vaccines  Completed  . Flu Shot  Discontinued     GENERAL HEALTH GOALS  Know what a healthy weight is for you (roughly BMI <25) and aim to maintain this  Aim for 7+ servings of fruits and vegetables daily  70-80+ fluid ounces of water or unsweet tea for healthy kidneys  Limit to max 1 drink of alcohol per day; avoid smoking/tobacco  Limit animal fats in diet for cholesterol and heart  health - choose grass fed whenever available  Avoid highly processed foods, and foods high in saturated/trans fats  Aim for low stress - take time to unwind and care for your mental health  Aim for 150 min of moderate intensity exercise weekly for heart health, and weights twice weekly for bone health  Aim for 7-9 hours of sleep daily

## 2020-05-28 LAB — CBC WITH DIFFERENTIAL/PLATELET
Absolute Monocytes: 608 cells/uL (ref 200–950)
Basophils Absolute: 47 cells/uL (ref 0–200)
Basophils Relative: 0.8 %
Eosinophils Absolute: 77 cells/uL (ref 15–500)
Eosinophils Relative: 1.3 %
HCT: 38.3 % (ref 35.0–45.0)
Hemoglobin: 12.9 g/dL (ref 11.7–15.5)
Lymphs Abs: 1918 cells/uL (ref 850–3900)
MCH: 32 pg (ref 27.0–33.0)
MCHC: 33.7 g/dL (ref 32.0–36.0)
MCV: 95 fL (ref 80.0–100.0)
MPV: 9.5 fL (ref 7.5–12.5)
Monocytes Relative: 10.3 %
Neutro Abs: 3251 cells/uL (ref 1500–7800)
Neutrophils Relative %: 55.1 %
Platelets: 195 10*3/uL (ref 140–400)
RBC: 4.03 10*6/uL (ref 3.80–5.10)
RDW: 11.4 % (ref 11.0–15.0)
Total Lymphocyte: 32.5 %
WBC: 5.9 10*3/uL (ref 3.8–10.8)

## 2020-05-28 LAB — COMPLETE METABOLIC PANEL WITH GFR
AG Ratio: 2.8 (calc) — ABNORMAL HIGH (ref 1.0–2.5)
ALT: 24 U/L (ref 6–29)
AST: 23 U/L (ref 10–35)
Albumin: 4.4 g/dL (ref 3.6–5.1)
Alkaline phosphatase (APISO): 133 U/L (ref 37–153)
BUN: 20 mg/dL (ref 7–25)
CO2: 30 mmol/L (ref 20–32)
Calcium: 10 mg/dL (ref 8.6–10.4)
Chloride: 106 mmol/L (ref 98–110)
Creat: 0.88 mg/dL (ref 0.60–0.93)
GFR, Est African American: 77 mL/min/{1.73_m2} (ref >=60)
GFR, Est Non African American: 66 mL/min/{1.73_m2} (ref >=60)
Globulin: 1.6 g/dL (calc) — ABNORMAL LOW (ref 1.9–3.7)
Glucose, Bld: 93 mg/dL (ref 65–99)
Potassium: 5.6 mmol/L — ABNORMAL HIGH (ref 3.5–5.3)
Sodium: 143 mmol/L (ref 135–146)
Total Bilirubin: 0.3 mg/dL (ref 0.2–1.2)
Total Protein: 6 g/dL — ABNORMAL LOW (ref 6.1–8.1)

## 2020-05-28 LAB — LIPID PANEL
Cholesterol: 203 mg/dL — ABNORMAL HIGH (ref <200)
HDL: 105 mg/dL (ref >=50)
LDL Cholesterol (Calc): 79 mg/dL (calc)
Non-HDL Cholesterol (Calc): 98 mg/dL (calc) (ref <130)
Total CHOL/HDL Ratio: 1.9 (calc) (ref <5.0)
Triglycerides: 102 mg/dL (ref <150)

## 2020-05-28 LAB — TSH: TSH: 1.76 mIU/L (ref 0.40–4.50)

## 2020-06-21 ENCOUNTER — Other Ambulatory Visit: Payer: Self-pay | Admitting: Internal Medicine

## 2020-06-24 DIAGNOSIS — L570 Actinic keratosis: Secondary | ICD-10-CM | POA: Diagnosis not present

## 2020-06-24 DIAGNOSIS — Z85828 Personal history of other malignant neoplasm of skin: Secondary | ICD-10-CM | POA: Diagnosis not present

## 2020-07-02 ENCOUNTER — Other Ambulatory Visit: Payer: Self-pay | Admitting: Internal Medicine

## 2020-07-04 ENCOUNTER — Encounter: Payer: PPO | Admitting: Internal Medicine

## 2020-07-16 DIAGNOSIS — L245 Irritant contact dermatitis due to other chemical products: Secondary | ICD-10-CM | POA: Diagnosis not present

## 2020-07-16 DIAGNOSIS — Z85828 Personal history of other malignant neoplasm of skin: Secondary | ICD-10-CM | POA: Diagnosis not present

## 2020-07-16 DIAGNOSIS — C44722 Squamous cell carcinoma of skin of right lower limb, including hip: Secondary | ICD-10-CM | POA: Diagnosis not present

## 2020-07-16 DIAGNOSIS — L570 Actinic keratosis: Secondary | ICD-10-CM | POA: Diagnosis not present

## 2020-07-16 DIAGNOSIS — D485 Neoplasm of uncertain behavior of skin: Secondary | ICD-10-CM | POA: Diagnosis not present

## 2020-07-22 ENCOUNTER — Ambulatory Visit (INDEPENDENT_AMBULATORY_CARE_PROVIDER_SITE_OTHER): Payer: PPO | Admitting: Adult Health Nurse Practitioner

## 2020-07-22 ENCOUNTER — Encounter: Payer: Self-pay | Admitting: Adult Health Nurse Practitioner

## 2020-07-22 ENCOUNTER — Other Ambulatory Visit: Payer: Self-pay

## 2020-07-22 VITALS — BP 112/76 | HR 73 | Temp 97.3°F | Ht 64.75 in | Wt 127.4 lb

## 2020-07-22 DIAGNOSIS — F418 Other specified anxiety disorders: Secondary | ICD-10-CM

## 2020-07-22 DIAGNOSIS — E559 Vitamin D deficiency, unspecified: Secondary | ICD-10-CM

## 2020-07-22 DIAGNOSIS — J302 Other seasonal allergic rhinitis: Secondary | ICD-10-CM

## 2020-07-22 DIAGNOSIS — R7309 Other abnormal glucose: Secondary | ICD-10-CM

## 2020-07-22 DIAGNOSIS — I10 Essential (primary) hypertension: Secondary | ICD-10-CM

## 2020-07-22 DIAGNOSIS — Z0000 Encounter for general adult medical examination without abnormal findings: Secondary | ICD-10-CM

## 2020-07-22 DIAGNOSIS — E039 Hypothyroidism, unspecified: Secondary | ICD-10-CM

## 2020-07-22 DIAGNOSIS — I350 Nonrheumatic aortic (valve) stenosis: Secondary | ICD-10-CM

## 2020-07-22 DIAGNOSIS — Z6822 Body mass index (BMI) 22.0-22.9, adult: Secondary | ICD-10-CM

## 2020-07-22 DIAGNOSIS — E782 Mixed hyperlipidemia: Secondary | ICD-10-CM

## 2020-07-22 DIAGNOSIS — M85852 Other specified disorders of bone density and structure, left thigh: Secondary | ICD-10-CM

## 2020-07-22 NOTE — Patient Instructions (Addendum)
Use name: [id number]    Allergy Symptoms / Runny Nose: Chose one  Chlorpheniramine 4mg  Can take every 4-6 hours as neded  Claritin 10mg  This works great for itchy watery eyes / itchy ears   Zyrtec / Cetirizine Take 10mg  by mouth May cause drowsiness, take nightly Be sure to drink plenty of water If this is not effective, try Xyzal or Allegra  OR  Xyzal / Levocetirazine  Take 5mg  by mouth May cause drowsiness, take nightly Be sure to drink plenty of water If this is not effective try Allegra or Zyrtec  OR  Allegra / fexofenadine Take 180mg  by mouth daily If this is not effective try Zyrtec or Xyzal   *If you battle with chronic allergies you may need to change the antihistamine you currently use to find most effective.   You may continue taking Singular with one of the above antihistamines.     GENERAL HEALTH GOALS  Know what a healthy weight is for you (roughly BMI <25) and aim to maintain this  Aim for 7+ servings of fruits and vegetables daily  70-80+ fluid ounces of water or unsweet tea for healthy kidneys  Limit to max 1 drink of alcohol per day; avoid smoking/tobacco  Limit animal fats in diet for cholesterol and heart health - choose grass fed whenever available  Avoid highly processed foods, and foods high in saturated/trans fats  Aim for low stress - take time to unwind and care for your mental health  Aim for 150 min of moderate intensity exercise weekly for heart health, and weights twice weekly for bone health  Aim for 7-9 hours of sleep daily

## 2020-07-22 NOTE — Progress Notes (Signed)
COMPLETE PHYSICAL   Assessment:   Encounter for routine medical examination 1 year    Essential hypertension At goal without medications at this time Monitor blood pressure at home; call if consistently over 130/80 Continue DASH diet.   Reminder to go to the ER if any CP, SOB, nausea, dizziness, severe HA, changes vision/speech, left arm numbness and tingling and jaw pain.  Mixed hyperlipidemia Continue low cholesterol diet and exercise.  Check lipid panel.   BMI 22.0-22.9, adult Continue to recommend diet heavy in fruits and veggies and low in animal meats, cheeses, and dairy products, appropriate calorie intake Discuss exercise recommendations routinely Continue to monitor weight at each visit  Aortic stenosis, moderate Followed by Dr. Johnsie Cancel  Seasonal allergic rhinitis, unspecified trigger - Allegra OTC, increase H20, allergy hygiene explained.  Vitamin D deficient osteomalacia Vitamin D at goal at recent check; continue to recommend supplementation for goal of 70-100 Defer vitamin D level   Hypothyroidism Taking levothyroxine 88 mcg daily Reminder to take on an empty stomach 30-55mins before first meal of the day. No antacid medications for 4 hours.  Depression/anxiety Doing well with wellbutrin, continue  Osteopenia, left neck femur Continue vitamin D, add weight bearing exercises.  Get with MGM this year  Medication Management Continued  Call patient with lab results:    Over 30 minutes of face to face interview, exam, counseling, chart review and critical decision making was performed Declines labs, will get next OV Future Appointments  Date Time Provider [place]  10/28/2020  9:30 AM Garnet Sierras, NP [facility] None  06/02/2021  4:00 PM Garnet Sierras, NP [facility] None  07/22/2021  3:00 PM Garnet Sierras, NP [facility] None      Subjective:  Kathryn Chavez is a 70 y.o. female who presents for complete physical.  She has been  having an increase in allergy symptoms.  She has switched to xyzal one day ago.  She is not sure if this is helping yet or not.  She is also using Flonase and rotates with Nasacort.   She also has significant hx/o R lumpectomy in 2001 treated with Chemo radiation and followed with 5 years of Arimidex. She is followed by Dr. Johnsie Cancel for moderate aortic stenosis. 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    She is doing very well on the wellbutrin, she very rarely takes xanax.   BMI is Body mass index is 21.36 kg/m., she has been working on diet and exercise. Wt Readings from Last 3 Encounters:  07/22/20 127 lb 6.4 oz (57.8 kg)  05/27/20 128 lb (58.1 kg)  03/26/20 127 lb (57.6 kg)    Her blood pressure has been controlled at home, today their BP is BP: 112/76 She does workout. She denies chest pain, shortness of breath, dizziness.   She is on cholesterol medication, increased to 1 tablet of the crestor 5mg  and denies myalgias. Her cholesterol is not at goal. The cholesterol last visit was:   Lab Results  Component Value Date   CHOL 203 (H) 05/27/2020   HDL 105 05/27/2020   LDLCALC 79 05/27/2020   LDLDIRECT 73.3 04/30/2011   TRIG 102 05/27/2020   CHOLHDL 1.9 05/27/2020    Last A1C in the office was:  Lab Results  Component Value Date   HGBA1C 5.2 01/11/2020   Last GFR: Lab Results  Component Value Date   [facility] 66 05/27/2020  Patient is on Vitamin D supplement and at goal at recent check:    Lab Results  Component Value Date   VD25OH 55 01/11/2020      Medication Review: Current Outpatient Medications on File Prior to Visit  Medication Sig Dispense Refill  . ALPRAZolam (XANAX) 0.5 MG tablet Take 1/2-1 tablet 1 to 2 x /week if needed for Anxiety or Sleep & Limit to 5 days /week 30 tablet 0  . aspirin EC 81 MG tablet Take 81 mg by mouth daily.    Marland Kitchen buPROPion (WELLBUTRIN XL)  300 MG 24 hr tablet TAKE 1 TABLET EVERY MORNINGFOR MOOD, ANXIETY, FOCUS AND CONCENTRATION. 90 tablet 0  . clindamycin (CLEOCIN) 150 MG capsule Take 150 mg by mouth as needed. For dental appts    . fluticasone (FLONASE) 50 MCG/ACT nasal spray Place 2 sprays into both nostrils daily. 16 g 0  . gabapentin (NEURONTIN) 100 MG capsule TAKE (1) CAPSULE THREE TIMES DAILY. 270 capsule 3  . levothyroxine (SYNTHROID) 50 MCG tablet TAKE 1 TAB IN THE AM 30 MINUTES BEFORE FOOD, NO ANTACIDS,CALCIUM,MAGNES- IUM, BIOTIN FOR 4 HOURS. 90 tablet 0  . rosuvastatin (CRESTOR) 5 MG tablet Take  1 tablet  Daily  for Cholesterol 90 tablet 1   No current facility-administered medications on file prior to visit.    Allergies  Allergen Reactions  . Influenza Vaccines Nausea And Vomiting and Other (See Comments)    dizziness  . Other Nausea And Vomiting and Other (See Comments)    dizziness  . Penicillamine Hives  . Penicillins     REACTION: Hives  . Sulfamethoxazole Hives  . Sulfonamide Derivatives     REACTION: Hives    Current Problems (verified) Patient Active Problem List   Diagnosis Date Noted  . Hypothyroidism 10/02/2019  . Osteopenia 11/04/2017  . Onychomycosis of great toe 09/08/2017  . Aortic stenosis, moderate 09/09/2016  . Hyperlipidemia 01/16/2015  . Abnormal ECG 01/23/2014  . Female cystocele 02/09/2011  . Vitamin D deficient osteomalacia 10/28/2010  . Essential hypertension 12/16/2009  . Allergic rhinitis 12/16/2009    Screening Tests Immunization History  Administered Date(s) Administered  . Influenza Split 02/26/2011, 03/11/2012  . Influenza, High Dose Seasonal PF 01/29/2016  . Influenza,inj,Quad PF,6+ Mos 05/02/2013  . PFIZER(Purple Top)SARS-COV-2 Vaccination 06/12/2019, 07/03/2019  . Pneumococcal Conjugate-13 08/05/2017  . Pneumococcal Polysaccharide-23 01/16/2015  . Tdap 10/28/2010   Preventative care: Last colonoscopy: 2013 Dr Watt Climes , no further per patient, discussed Last  mammogram: 12/2019 Last pap smear/pelvic exam: 2010, declines further, hx hysterectomy   DEXA: 10/2017 Femur Neck Left is 0.852 g/cm2 with a T-score of -1.3. Echo 04/2020  Prior vaccinations: TD or Tdap: 2012, Due discussed with patient  Influenza: 2017 Pneumococcal: 2016 Prevnar13: 2019 Shingles/Zostavax: n/a in office Personal history of COVID: 12/2019  Names of Other Physician/Practitioners you currently use: 1. [facility] here for primary care 2. Dr. [place], eye doctor, Due for 2022 3. Dr. Marland Kitchen , dentist, last visit 2018 due for follow up   Patient Care Team: Unk Pinto, MD as PCP - General (Internal Medicine) Josue Hector, MD as PCP - Cardiology (Cardiology) [place], Heather, [place] (Optometry)  SURGICAL HISTORY She  has a past surgical history that includes Abdominal hysterectomy; Cystocele repair (N/A, 05/12/2018); and Breast lumpectomy (Right, 2000). FAMILY HISTORY Her family history includes Arthritis in an other family member; Breast cancer in an other family member; Breast cancer (age of onset: 34) in her daughter; Breast cancer (age of  onset: 40) in her daughter; Breast cancer (age of onset: 24) in her mother; Hyperlipidemia in an other family member; Lung cancer in her mother and another family member; Other in her father; Prostate cancer in an other family member. SOCIAL HISTORY She  reports that she has never smoked. She has never used smokeless tobacco. She reports that she does not drink alcohol and does not use drugs.   Review of Systems  Constitutional: Negative for malaise/fatigue and weight loss.  HENT: Negative for hearing loss and tinnitus.   Eyes: Negative for blurred vision and double vision.  Respiratory: Negative for cough, sputum production, shortness of breath and wheezing.   Cardiovascular: Negative for chest pain, palpitations, orthopnea, claudication, leg swelling and PND.  Gastrointestinal: Negative for abdominal  pain, blood in stool, constipation, diarrhea, heartburn, melena, nausea and vomiting.  Genitourinary: Negative.   Musculoskeletal: Negative for falls, joint pain and myalgias.  Skin: Negative for rash.  Neurological: Negative for dizziness, tingling, sensory change, weakness and headaches.  Endo/Heme/Allergies: Negative for polydipsia.  Psychiatric/Behavioral: Negative.  Negative for depression, memory loss, substance abuse and suicidal ideas. The patient is not nervous/anxious and does not have insomnia.   All other systems reviewed and are negative.    Objective:     Today's Vitals   07/22/20 1507  BP: 112/76  Pulse: 73  Temp: (!) 97.3 F (36.3 C)  SpO2: 99%  Weight: 127 lb 6.4 oz (57.8 kg)  Height: 5' 4.75" (1.645 m)   Body mass index is 21.36 kg/m.  General appearance: alert, no distress, WD/WN, female HEENT: normocephalic, sclerae anicteric, TMs pearly, nares patent, no discharge or erythema, pharynx normal Oral cavity: MMM, no lesions Neck: supple, no lymphadenopathy, no thyromegaly, no masses Heart: RRR, normal S1, S2, systolic murmur radiation to the carotids. Lungs: CTA bilaterally, no wheezes, rhonchi, or rales Abdomen: +bs, soft, non tender, non distended, no masses, no hepatomegaly, no splenomegaly Musculoskeletal: nontender, no swelling, no obvious deformity Extremities: no edema, no cyanosis, no clubbing Pulses: 2+ symmetric, upper and lower extremities, normal cap refill Neurological: alert, oriented x 3, CN2-12 intact, strength normal upper extremities and lower extremities, sensation normal throughout, DTRs 2+ throughout, no cerebellar signs, gait normal Psychiatric: normal affect, behavior normal, pleasant    Garnet Sierras, Laqueta Jean, DNP [facility] 07/22/2020  4:19 PM

## 2020-07-23 LAB — COMPLETE METABOLIC PANEL WITH GFR
AG Ratio: 2.6 (calc) — ABNORMAL HIGH (ref 1.0–2.5)
ALT: 21 U/L (ref 6–29)
AST: 25 U/L (ref 10–35)
Albumin: 4.7 g/dL (ref 3.6–5.1)
Alkaline phosphatase (APISO): 110 U/L (ref 37–153)
BUN: 17 mg/dL (ref 7–25)
CO2: 26 mmol/L (ref 20–32)
Calcium: 9.8 mg/dL (ref 8.6–10.4)
Chloride: 99 mmol/L (ref 98–110)
Creat: 0.79 mg/dL (ref 0.60–0.93)
GFR, Est African American: 87 mL/min/{1.73_m2} (ref >=60)
GFR, Est Non African American: 75 mL/min/{1.73_m2} (ref >=60)
Globulin: 1.8 g/dL (calc) — ABNORMAL LOW (ref 1.9–3.7)
Glucose, Bld: 78 mg/dL (ref 65–99)
Potassium: 4.2 mmol/L (ref 3.5–5.3)
Sodium: 137 mmol/L (ref 135–146)
Total Bilirubin: 0.5 mg/dL (ref 0.2–1.2)
Total Protein: 6.5 g/dL (ref 6.1–8.1)

## 2020-08-14 ENCOUNTER — Other Ambulatory Visit: Payer: Self-pay | Admitting: Adult Health

## 2020-08-14 DIAGNOSIS — F418 Other specified anxiety disorders: Secondary | ICD-10-CM

## 2020-08-26 IMAGING — MG DIGITAL SCREENING BILATERAL MAMMOGRAM WITH TOMO AND CAD
8 series · 9 of 24 positions shown · non-contrast
Comparison: Previous exam(s).

CLINICAL DATA: Screening.

EXAM:
DIGITAL SCREENING BILATERAL MAMMOGRAM WITH TOMO AND CAD

[R MLO synth-2D]
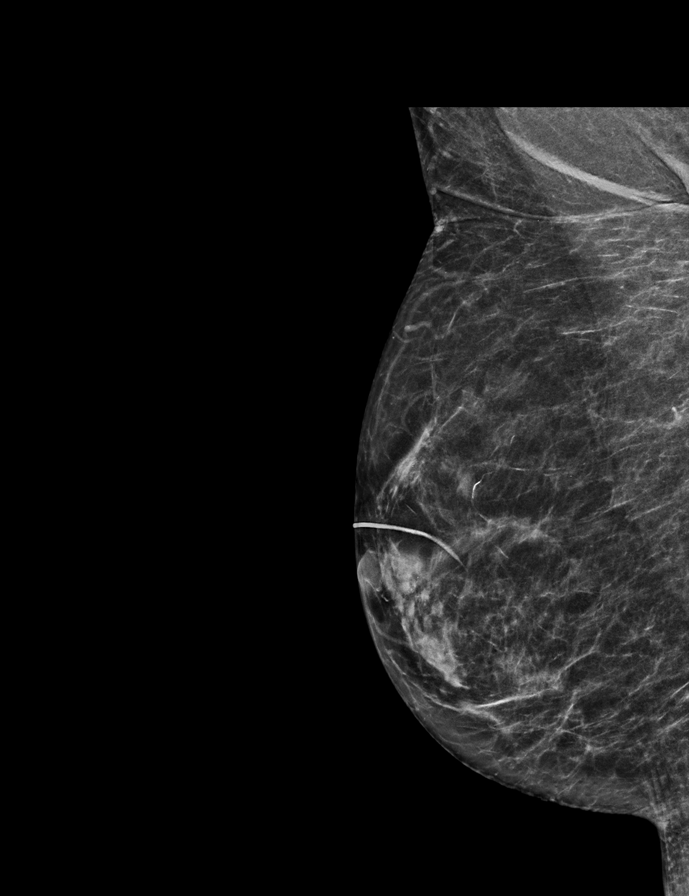

[R CC synth-2D]
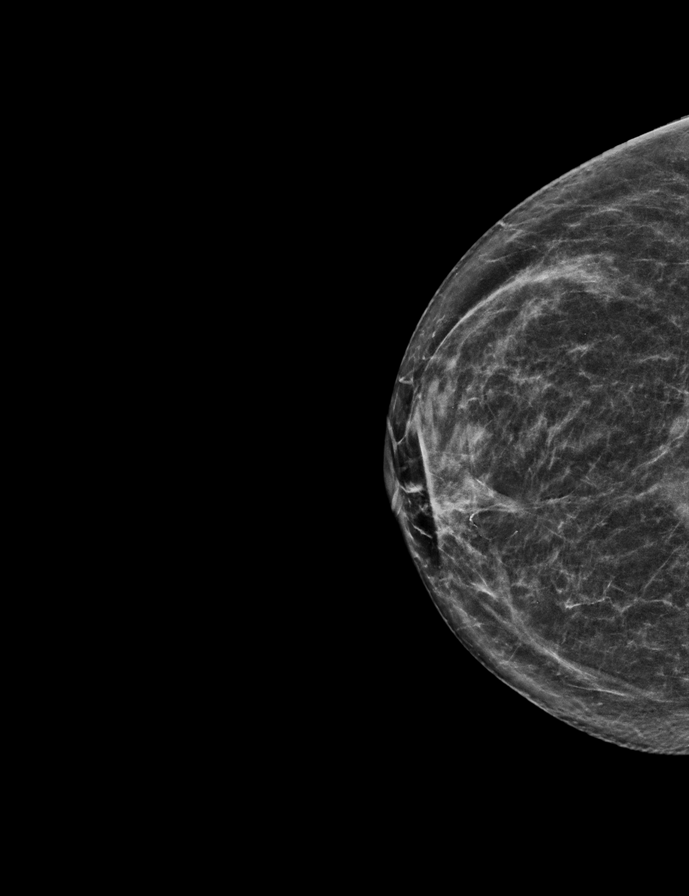

[L MLO synth-2D]
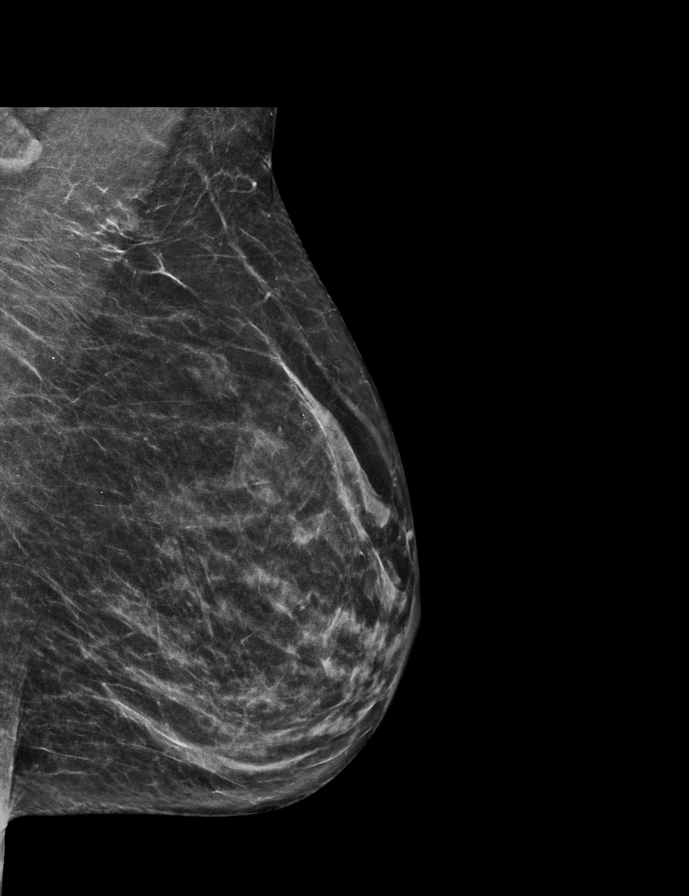

[L CC synth-2D]
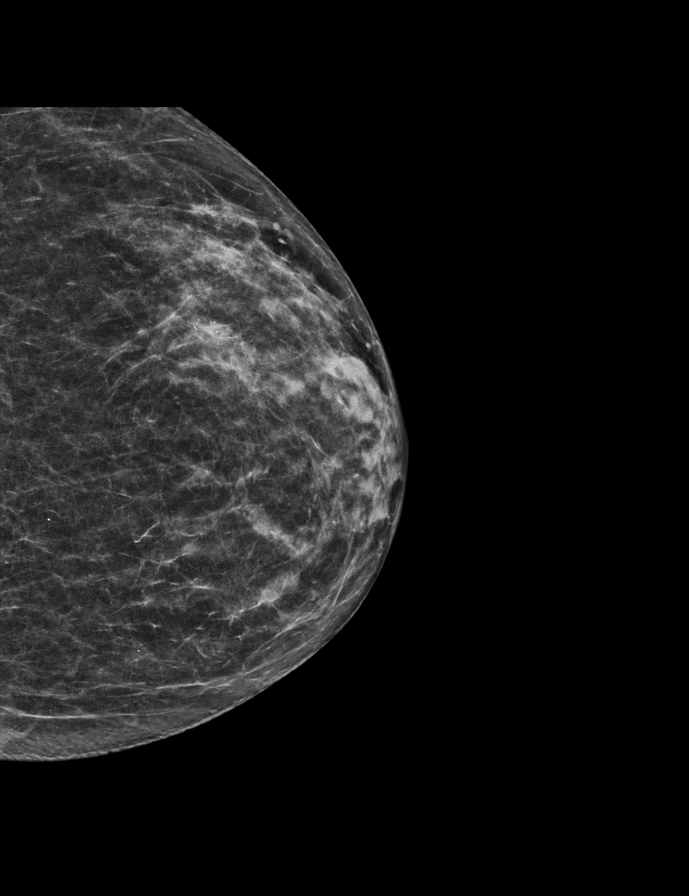

[R MLO tomo · 2 of 54 frames shown]
[frame 18/54]
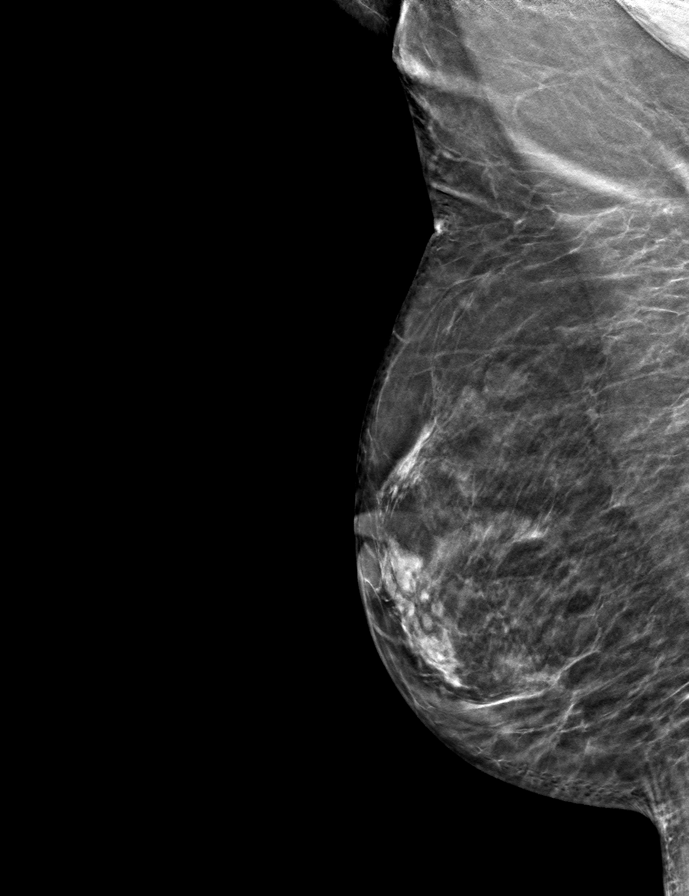
[frame 27/54]
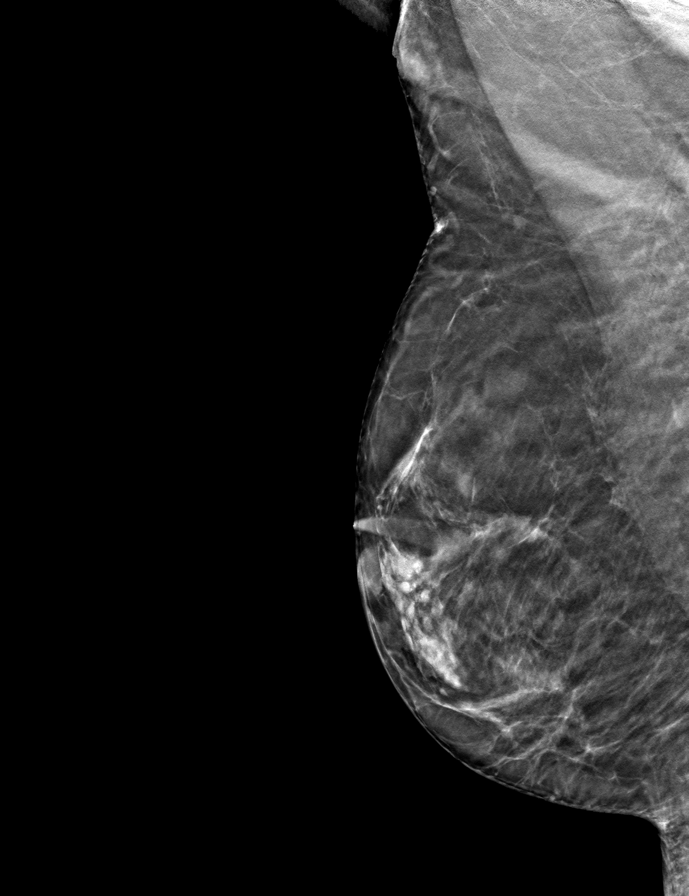

[L MLO tomo · tomo slice 29/56.0]
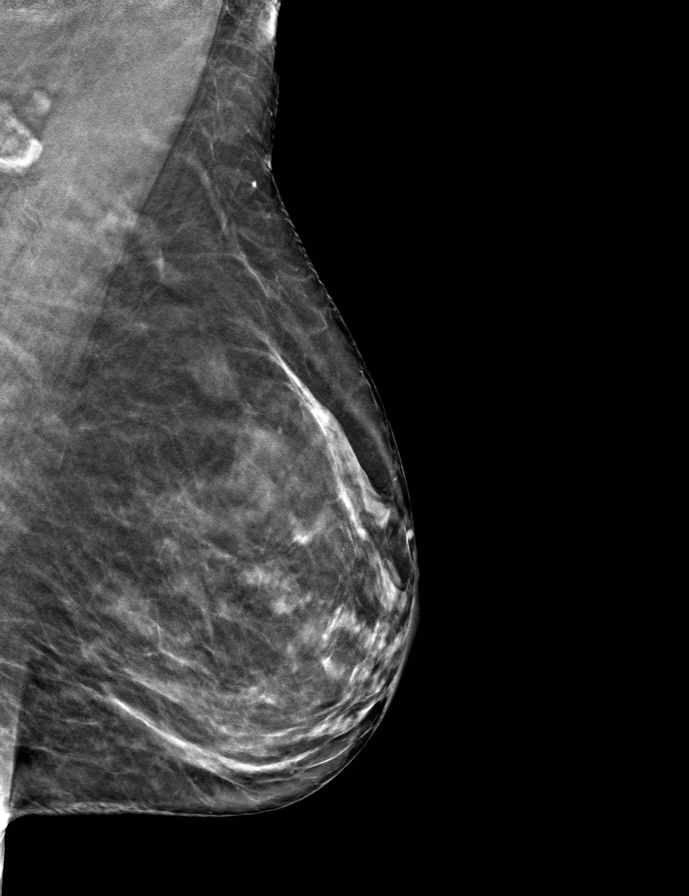

[R CC tomo · tomo slice 27/54.0]
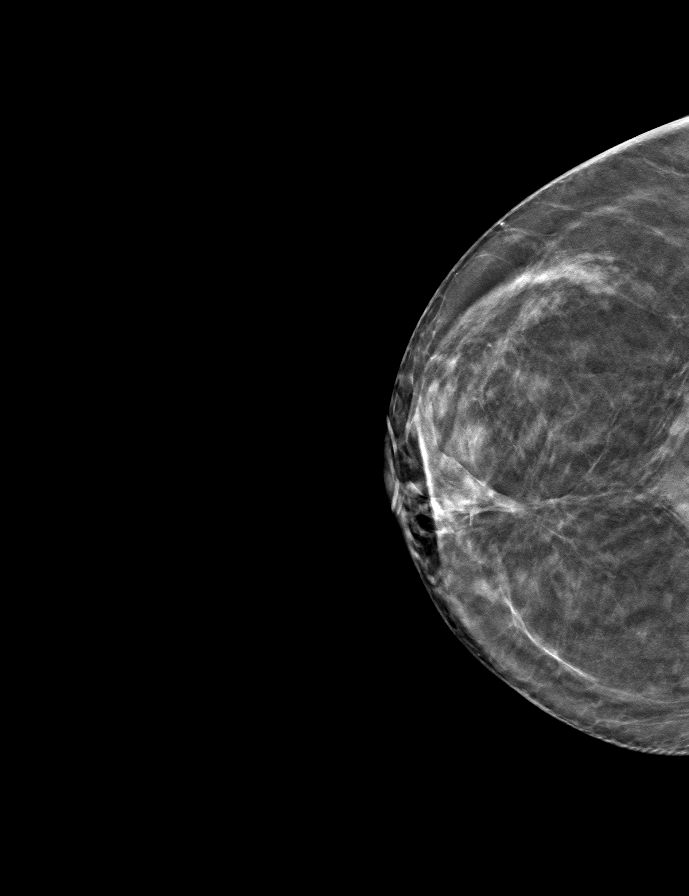

[L CC tomo · tomo slice 25/50.0]
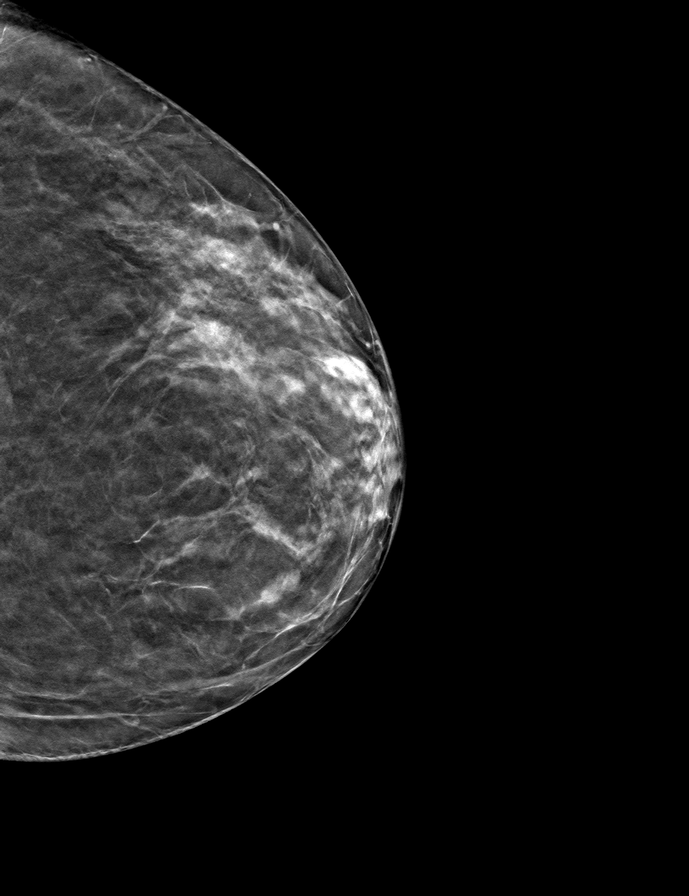

[9 of 24 positions shown; findings below may reference images not displayed]

ACR Breast Density Category c: The breast tissue is heterogeneously
dense, which may obscure small masses.
FINDINGS: In the left breast, calcifications in the upper outer breast warrant
further evaluation. In the right breast, no findings suspicious for
malignancy. Images were processed with CAD.
IMPRESSION: Further evaluation is suggested for calcifications in the left
breast.

RECOMMENDATION:
Diagnostic mammogram of the left breast. (Code:CD-2-AAC)

The patient will be contacted regarding the findings, and additional
imaging will be scheduled.

BI-RADS CATEGORY  0: Incomplete. Need additional imaging evaluation
and/or prior mammograms for comparison.

## 2020-08-30 DIAGNOSIS — L821 Other seborrheic keratosis: Secondary | ICD-10-CM | POA: Diagnosis not present

## 2020-08-30 DIAGNOSIS — D0462 Carcinoma in situ of skin of left upper limb, including shoulder: Secondary | ICD-10-CM | POA: Diagnosis not present

## 2020-08-30 DIAGNOSIS — Z85828 Personal history of other malignant neoplasm of skin: Secondary | ICD-10-CM | POA: Diagnosis not present

## 2020-08-30 DIAGNOSIS — D485 Neoplasm of uncertain behavior of skin: Secondary | ICD-10-CM | POA: Diagnosis not present

## 2020-08-30 DIAGNOSIS — L570 Actinic keratosis: Secondary | ICD-10-CM | POA: Diagnosis not present

## 2020-09-09 ENCOUNTER — Other Ambulatory Visit: Payer: Self-pay | Admitting: Adult Health

## 2020-09-09 DIAGNOSIS — E039 Hypothyroidism, unspecified: Secondary | ICD-10-CM

## 2020-10-07 ENCOUNTER — Other Ambulatory Visit: Payer: Self-pay | Admitting: Internal Medicine

## 2020-10-07 ENCOUNTER — Ambulatory Visit: Payer: PPO | Admitting: Adult Health Nurse Practitioner

## 2020-10-07 MED ORDER — DOXYCYCLINE HYCLATE 100 MG PO CAPS: ORAL_CAPSULE | ORAL | 0 refills | Status: DC

## 2020-10-07 MED ORDER — DEXAMETHASONE 4 MG PO TABS: ORAL_TABLET | ORAL | 0 refills | Status: DC

## 2020-10-07 MED ORDER — AZITHROMYCIN 250 MG PO TABS: ORAL_TABLET | ORAL | 0 refills | Status: DC

## 2020-10-09 ENCOUNTER — Other Ambulatory Visit: Payer: Self-pay | Admitting: Internal Medicine

## 2020-10-10 DIAGNOSIS — C44722 Squamous cell carcinoma of skin of right lower limb, including hip: Secondary | ICD-10-CM | POA: Diagnosis not present

## 2020-10-10 DIAGNOSIS — Z85828 Personal history of other malignant neoplasm of skin: Secondary | ICD-10-CM | POA: Diagnosis not present

## 2020-10-17 ENCOUNTER — Other Ambulatory Visit: Payer: Self-pay | Admitting: Internal Medicine

## 2020-10-17 DIAGNOSIS — N952 Postmenopausal atrophic vaginitis: Secondary | ICD-10-CM | POA: Diagnosis not present

## 2020-10-24 NOTE — Progress Notes (Signed)
6 Month Follow Up   Assessment and Plan:   Kathryn Chavez was seen today for a 6 month follow-up.  Diagnoses and all orders for this visit:  Mixed hyperlipidemia Cholesterol Continue medications: Crestor 40mg  Continue low cholesterol diet and exercise.  Check lipid panel.  -     Lipid panel  Essential hypertension No medications at this time, continue to monitor -     CBC with Differential/Platelet -     COMPLETE METABOLIC PANEL WITH GFR  Hypothyroidism       - Continue Levothyroxine 50 mcg QD   Check TSH  Aortic stenosis, moderate Monitor lipids, sugars and blood pressure. Last echo was 04/2020  Seasonal allergic rhinitis, unspecified trigger Doing well at this time Using Flonase 2 sprays PRN  Vitamin D deficient osteomalacia Continue supplementation -     VITAMIN D 25 Hydroxy (Vit-D Deficiency)     Patient agrees with plan of care.  Follow up in 3 months. Continue diet and meds as discussed. Further disposition pending results of labs. Discussed med's effects and SE's.  Patient agrees with plan of care and opportunity to ask questions/voice concerns. Over 30 minutes of chart review, interview, exam, counseling, and critical decision making was performed.   Future Appointments  Date Time Provider [place]  06/02/2021  4:00 PM Kathryn Sierras, NP [facility] None  07/22/2021  3:00 PM McClanahan, Danton Sewer, NP [facility] None    ----------------------------------------------------------------------------------------------------------------------  HPI Kathryn Chavez  presents for a 6  month follow up on HTN, HLD, hypothyroidism, history of pre-diabetes,  moderate aortic stenosis, seasonal allergies and vitamin D deficiency.   BMI is Body mass index is 21.63 kg/m., she has been working on diet and exercise. Wt Readings from Last 3 Encounters:  10/28/20 129 lb (58.5 kg)  07/22/20 127 lb 6.4 oz (57.8 kg)  05/27/20 128 lb (58.1 kg)    Her blood pressure has been  controlled at home, today their BP is BP: 132/80  She does workout. She denies any cardiac symptoms, chest pains, palpitations, shortness of breath, dizziness or lower extremity edema.     She is on cholesterol medication Rosuvastatin and denies myalgias. Her cholesterol is not at goal. The cholesterol last visit was:   Lab Results  Component Value Date   CHOL 203 (H) 05/27/2020   HDL 105 05/27/2020   LDLCALC 79 05/27/2020   LDLDIRECT 73.3 04/30/2011   TRIG 102 05/27/2020   CHOLHDL 1.9 05/27/2020   Levothyroxine 50 mcg daily Lab Results  Component Value Date   TSH 1.76 05/27/2020     Patient is on Vitamin D supplement.   Lab Results  Component Value Date   VD25OH 55 01/11/2020       Current Medications:  Current Outpatient Medications on File Prior to Visit  Medication Sig   ALPRAZolam (XANAX) 0.5 MG tablet TAKE 1/2-1 TABLET UP TO TWICE A DAY AS NEEDED FOR ANXIETY/ SLEEP;LIMIT USE TO LESS THAN 5 DAYS/WEEK.   buPROPion (WELLBUTRIN XL) 300 MG 24 hr tablet TAKE 1 TABLET EVERY MORNING FOR MOOD, ANXIETY, FOCUS AND CONCENTRATION.   clindamycin (CLEOCIN) 150 MG capsule Take 150 mg by mouth as needed. For dental appts   fluticasone (FLONASE) 50 MCG/ACT nasal spray Place 2 sprays into both nostrils daily.   gabapentin (NEURONTIN) 100 MG capsule TAKE (1) CAPSULE THREE TIMES DAILY.   levothyroxine (SYNTHROID) 50 MCG tablet Take  1 tablet  Daily  on an empty stomach with only water for 30 minutes & no Antacid meds,  Calcium or Magnesium for 4 hours & avoid Biotin   rosuvastatin (CRESTOR) 5 MG tablet Take  1 tablet  Daily  for Cholesterol   aspirin EC 81 MG tablet Take 81 mg by mouth daily. (Patient not taking: Reported on 10/28/2020)   dexamethasone (DECADRON) 4 MG tablet Take 1 tab 3 x /day for 2 days,      then 2 x /day for 2  Days,     then 1 tab daily   doxycycline (VIBRAMYCIN) 100 MG capsule Take  1 capsule  2 x /day  with meals for Infection   No current facility-administered  medications on file prior to visit.    Allergies:  Allergies  Allergen Reactions   Influenza Vaccines Nausea And Vomiting and Other (See Comments)    dizziness   Other Nausea And Vomiting and Other (See Comments)    dizziness   Penicillamine Hives   Penicillins     REACTION: Hives   Sulfamethoxazole Hives   Sulfonamide Derivatives     REACTION: Hives     Medical History:  Past Medical History:  Diagnosis Date   Allergy    rhinitis   Aortic stenosis, moderate    on echo in 2015.   Cancer [facility])    breast   Hyperlipidemia    Hypertension    Personal history of chemotherapy 2000   Personal history of radiation therapy 2000    Family history- Reviewed and unchanged   Social history- Reviewed and unchanged    Patient Care Team: Unk Pinto, MD as PCP - General (Internal Medicine) Josue Hector, MD as PCP - Cardiology (Cardiology) [place], Heather, OD (Optometry)    Review of Systems:  Review of Systems  Constitutional:  Negative for chills, diaphoresis, fever, malaise/fatigue and weight loss.  HENT:  Negative for congestion, ear discharge, ear pain, hearing loss, nosebleeds, sinus pain, sore throat and tinnitus.   Eyes:  Negative for blurred vision, double vision, photophobia, pain, discharge and redness.  Respiratory:  Negative for cough, hemoptysis, sputum production, shortness of breath, wheezing and stridor.   Cardiovascular:  Negative for chest pain, palpitations, orthopnea, claudication, leg swelling and PND.  Gastrointestinal:  Negative for abdominal pain, blood in stool, constipation, diarrhea, heartburn, melena, nausea and vomiting.  Genitourinary:  Negative for dysuria, flank pain, frequency, hematuria and urgency.  Musculoskeletal:  Negative for back pain, falls, joint pain, myalgias and neck pain.  Skin:  Negative for itching and rash.  Neurological:  Negative for dizziness, tingling, tremors, sensory change, speech change, focal weakness, seizures,  loss of consciousness, weakness and headaches.  Endo/Heme/Allergies:  Negative for environmental allergies and polydipsia. Does not bruise/bleed easily.  Psychiatric/Behavioral:  Negative for depression, hallucinations, memory loss, substance abuse and suicidal ideas. The patient is not nervous/anxious and does not have insomnia.      Physical Exam: BP 132/80   Pulse Kathryn   Temp (!) 97.3 F (36.3 C)   Wt 129 lb (58.5 kg)   SpO2 99%   BMI 21.63 kg/m   General Appearance: Well nourished, in no apparent distress. Eyes: PERRLA, EOMs, conjunctiva no swelling or erythema Sinuses: No Frontal/maxillary tenderness ENT/Mouth: Ext aud canals clear, TMs without erythema, bulging. No erythema, swelling, or exudate on post pharynx.  Tonsils not swollen or erythematous. Hearing normal.  Neck: Supple, thyroid normal.  Respiratory: Respiratory effort normal, BS equal bilaterally without rales, rhonchi, wheezing or stridor.  Cardio: RRR , normal S1, S2 systolic murmur . Brisk peripheral pulses without edema.  Abdomen:  Soft, + BS.  Non tender, no guarding, rebound, hernias, masses. Lymphatics: Non tender without lymphadenopathy.  Musculoskeletal: Full ROM, 5/5 strength, Normal gait Skin: Warm, dry without rashes, lesions, ecchymosis.  Neuro: Cranial nerves intact. No cerebellar symptoms.  Psych: Awake and oriented X 3, normal affect, Insight and Judgment appropriate.    Magda Bernheim, NP [facility] 4:40 PM

## 2020-10-28 ENCOUNTER — Encounter: Payer: Self-pay | Admitting: Nurse Practitioner

## 2020-10-28 ENCOUNTER — Other Ambulatory Visit: Payer: Self-pay

## 2020-10-28 ENCOUNTER — Ambulatory Visit: Payer: PPO | Admitting: Adult Health

## 2020-10-28 ENCOUNTER — Ambulatory Visit (INDEPENDENT_AMBULATORY_CARE_PROVIDER_SITE_OTHER): Payer: PPO | Admitting: Nurse Practitioner

## 2020-10-28 VITALS — BP 132/80 | HR 71 | Temp 97.3°F | Wt 129.0 lb

## 2020-10-28 DIAGNOSIS — E559 Vitamin D deficiency, unspecified: Secondary | ICD-10-CM

## 2020-10-28 DIAGNOSIS — I350 Nonrheumatic aortic (valve) stenosis: Secondary | ICD-10-CM | POA: Diagnosis not present

## 2020-10-28 DIAGNOSIS — E782 Mixed hyperlipidemia: Secondary | ICD-10-CM | POA: Diagnosis not present

## 2020-10-28 DIAGNOSIS — I10 Essential (primary) hypertension: Secondary | ICD-10-CM

## 2020-10-28 DIAGNOSIS — E039 Hypothyroidism, unspecified: Secondary | ICD-10-CM | POA: Diagnosis not present

## 2020-10-28 NOTE — Patient Instructions (Addendum)
GENERAL HEALTH GOALS   Know what a healthy weight is for you (roughly BMI <25) and aim to maintain this   Aim for 7+ servings of fruits and vegetables daily   70-80+ fluid ounces of water or unsweet tea for healthy kidneys   Limit to max 1 drink of alcohol per day; avoid smoking/tobacco   Limit animal fats in diet for cholesterol and heart health - choose grass fed whenever available   Avoid highly processed foods, and foods high in saturated/trans fats   Aim for low stress - take time to unwind and care for your mental health   Aim for 150 min of moderate intensity exercise weekly for heart health, and weights twice weekly for bone health   Aim for 7-9 hours of sleep daily    High Cholesterol  High cholesterol is a condition in which the blood has high levels of a white, waxy substance similar to fat (cholesterol). The liver makes all the cholesterol that the body needs. The human body needs small amounts of cholesterol to help build cells. A person gets extra orexcess cholesterol from the food that he or she eats. The blood carries cholesterol from the liver to the rest of the body. If you have high cholesterol, deposits (plaques) may build up on the walls of your arteries. Arteries are the blood vessels that carry blood away from your heart. These plaques make the arteries narrowand stiff. Cholesterol plaques increase your risk for heart attack and stroke. Work withyour health care provider to keep your cholesterol levels in a healthy range. What increases the risk? The following factors may make you more likely to develop this condition: Eating foods that are high in animal fat (saturated fat) or cholesterol. Being overweight. Not getting enough exercise. A family history of high cholesterol (familial hypercholesterolemia). Use of tobacco products. Having diabetes. What are the signs or symptoms? There are no symptoms of this condition. How is this diagnosed? This condition  may be diagnosed based on the results of a blood test. If you are older than 71 years of age, your health care provider may check your cholesterol levels every 4-6 years. You may be checked more often if you have high cholesterol or other risk factors for heart disease. The blood test for cholesterol measures: "Bad" cholesterol, or LDL cholesterol. This is the main type of cholesterol that causes heart disease. The desired level is less than 100 mg/dL. "Good" cholesterol, or HDL cholesterol. HDL helps protect against heart disease by cleaning the arteries and carrying the LDL to the liver for processing. The desired level for HDL is 60 mg/dL or higher. Triglycerides. These are fats that your body can store or burn for energy. The desired level is less than 150 mg/dL. Total cholesterol. This measures the total amount of cholesterol in your blood and includes LDL, HDL, and triglycerides. The desired level is less than 200 mg/dL. How is this treated? This condition may be treated with: Diet changes. You may be asked to eat foods that have more fiber and less saturated fats or added sugar. Lifestyle changes. These may include regular exercise, maintaining a healthy weight, and quitting use of tobacco products. Medicines. These are given when diet and lifestyle changes have not worked. You may be prescribed a statin medicine to help lower your cholesterol levels. Follow these instructions at home: Eating and drinking  Eat a healthy, balanced diet. This diet includes: Daily servings of a variety of fresh, frozen, or canned fruits and  vegetables. Daily servings of whole grain foods that are rich in fiber. Foods that are low in saturated fats and trans fats. These include poultry and fish without skin, lean cuts of meat, and low-fat dairy products. A variety of fish, especially oily fish that contain omega-3 fatty acids. Aim to eat fish at least 2 times a week. Avoid foods and drinks that have added  sugar. Use healthy cooking methods, such as roasting, grilling, broiling, baking, poaching, steaming, and stir-frying. Do not fry your food except for stir-frying.  Lifestyle  Get regular exercise. Aim to exercise for a total of 150 minutes a week. Increase your activity level by doing activities such as gardening, walking, and taking the stairs. Do not use any products that contain nicotine or tobacco, such as cigarettes, e-cigarettes, and chewing tobacco. If you need help quitting, ask your health care provider.  General instructions Take over-the-counter and prescription medicines only as told by your health care provider. Keep all follow-up visits as told by your health care provider. This is important. Where to find more information American Heart Association: www.heart.org National Heart, Lung, and Blood Institute: https://wilson-eaton.com/ Contact a health care provider if: You have trouble achieving or maintaining a healthy diet or weight. You are starting an exercise program. You are unable to stop smoking. Get help right away if: You have chest pain. You have trouble breathing. You have any symptoms of a stroke. "BE FAST" is an easy way to remember the main warning signs of a stroke: B - Balance. Signs are dizziness, sudden trouble walking, or loss of balance. E - Eyes. Signs are trouble seeing or a sudden change in vision. F - Face. Signs are sudden weakness or numbness of the face, or the face or eyelid drooping on one side. A - Arms. Signs are weakness or numbness in an arm. This happens suddenly and usually on one side of the body. S - Speech. Signs are sudden trouble speaking, slurred speech, or trouble understanding what people say. T - Time. Time to call emergency services. Write down what time symptoms started. You have other signs of a stroke, such as: A sudden, severe headache with no known cause. Nausea or vomiting. Seizure. These symptoms may represent a serious problem  that is an emergency. Do not wait to see if the symptoms will go away. Get medical help right away. Call your local emergency services (911 in the [place] Do not drive yourself to the hospital. Summary Cholesterol plaques increase your risk for heart attack and stroke. Work with your health care provider to keep your cholesterol levels in a healthy range. Eat a healthy, balanced diet, get regular exercise, and maintain a healthy weight. Do not use any products that contain nicotine or tobacco, such as cigarettes, e-cigarettes, and chewing tobacco. Get help right away if you have any symptoms of a stroke. This information is not intended to replace advice given to you by your health care provider. Make sure you discuss any questions you have with your healthcare provider. Document Revised: 03/06/2019 Document Reviewed: 03/06/2019 Elsevier Patient Education  2022 [place]. Hypothyroidism  Hypothyroidism is when the thyroid gland does not make enough of certain hormones (it is underactive). The thyroid gland is a small gland located in the lower front part of the neck, just in front of the windpipe (trachea). This gland makes hormones that help control how the body uses food for energy (metabolism) as well as how the heart and brain function. These hormones also  play a role in keeping your bones strong. When the thyroid is underactive, it produces too little of the hormones thyroxine (T4) and triiodothyronine (T3). What are the causes? This condition may be caused by: Hashimoto's disease. This is a disease in which the body's disease-fighting system (immune system) attacks the thyroid gland. This is the most common cause. Viral infections. Pregnancy. Certain medicines. Birth defects. Past radiation treatments to the head or neck for cancer. Past treatment with radioactive iodine. Past exposure to radiation in the environment. Past surgical removal of part or all of the thyroid. Problems with  a gland in the center of the brain (pituitary gland). Lack of enough iodine in the diet. What increases the risk? You are more likely to develop this condition if: You are female. You have a family history of thyroid conditions. You use a medicine called lithium. You take medicines that affect the immune system (immunosuppressants). What are the signs or symptoms? Symptoms of this condition include: Feeling as though you have no energy (lethargy). Not being able to tolerate cold. Weight gain that is not explained by a change in diet or exercise habits. Lack of appetite. Dry skin. Coarse hair. Menstrual irregularity. Slowing of thought processes. Constipation. Sadness or depression. How is this diagnosed? This condition may be diagnosed based on: Your symptoms, your medical history, and a physical exam. Blood tests. You may also have imaging tests, such as an ultrasound or MRI. How is this treated? This condition is treated with medicine that replaces the thyroid hormones that your body does not make. After you begin treatment, it may take several weeksfor symptoms to go away. Follow these instructions at home: Take over-the-counter and prescription medicines only as told by your health care provider. If you start taking any new medicines, tell your health care provider. Keep all follow-up visits as told by your health care provider. This is important. As your condition improves, your dosage of thyroid hormone medicine may change. You will need to have blood tests regularly so that your health care provider can monitor your condition. Contact a health care provider if: Your symptoms do not get better with treatment. You are taking thyroid hormone replacement medicine and you: Sweat a lot. Have tremors. Feel anxious. Lose weight rapidly. Cannot tolerate heat. Have emotional swings. Have diarrhea. Feel weak. Get help right away if you have: Chest pain. An irregular  heartbeat. A rapid heartbeat. Difficulty breathing. Summary Hypothyroidism is when the thyroid gland does not make enough of certain hormones (it is underactive). When the thyroid is underactive, it produces too little of the hormones thyroxine (T4) and triiodothyronine (T3). The most common cause is Hashimoto's disease, a disease in which the body's disease-fighting system (immune system) attacks the thyroid gland. The condition can also be caused by viral infections, medicine, pregnancy, or past radiation treatment to the head or neck. Symptoms may include weight gain, dry skin, constipation, feeling as though you do not have energy, and not being able to tolerate cold. This condition is treated with medicine to replace the thyroid hormones that your body does not make. This information is not intended to replace advice given to you by your health care provider. Make sure you discuss any questions you have with your healthcare provider. Document Revised: 01/05/2020 Document Reviewed: 12/21/2019 Elsevier Patient Education  2022 [employer].

## 2020-10-29 LAB — LIPID PANEL
Cholesterol: 207 mg/dL — ABNORMAL HIGH (ref <200)
HDL: 113 mg/dL (ref >=50)
LDL Cholesterol (Calc): 72 mg/dL (calc)
Non-HDL Cholesterol (Calc): 94 mg/dL (calc) (ref <130)
Total CHOL/HDL Ratio: 1.8 (calc) (ref <5.0)
Triglycerides: 139 mg/dL (ref <150)

## 2020-10-29 LAB — CBC WITH DIFFERENTIAL/PLATELET
Absolute Monocytes: 459 cells/uL (ref 200–950)
Basophils Absolute: 41 cells/uL (ref 0–200)
Basophils Relative: 0.8 %
Eosinophils Absolute: 71 cells/uL (ref 15–500)
Eosinophils Relative: 1.4 %
HCT: 36.1 % (ref 35.0–45.0)
Hemoglobin: 11.8 g/dL (ref 11.7–15.5)
Lymphs Abs: 1770 cells/uL (ref 850–3900)
MCH: 31 pg (ref 27.0–33.0)
MCHC: 32.7 g/dL (ref 32.0–36.0)
MCV: 94.8 fL (ref 80.0–100.0)
MPV: 9.7 fL (ref 7.5–12.5)
Monocytes Relative: 9 %
Neutro Abs: 2759 cells/uL (ref 1500–7800)
Neutrophils Relative %: 54.1 %
Platelets: 160 10*3/uL (ref 140–400)
RBC: 3.81 10*6/uL (ref 3.80–5.10)
RDW: 12.1 % (ref 11.0–15.0)
Total Lymphocyte: 34.7 %
WBC: 5.1 10*3/uL (ref 3.8–10.8)

## 2020-10-29 LAB — COMPLETE METABOLIC PANEL WITH GFR
AG Ratio: 3.1 (calc) — ABNORMAL HIGH (ref 1.0–2.5)
ALT: 21 U/L (ref 6–29)
AST: 23 U/L (ref 10–35)
Albumin: 4.3 g/dL (ref 3.6–5.1)
Alkaline phosphatase (APISO): 91 U/L (ref 37–153)
BUN/Creatinine Ratio: 13 (calc) (ref 6–22)
BUN: 13 mg/dL (ref 7–25)
CO2: 28 mmol/L (ref 20–32)
Calcium: 9.6 mg/dL (ref 8.6–10.4)
Chloride: 103 mmol/L (ref 98–110)
Creat: 1.01 mg/dL — ABNORMAL HIGH (ref 0.60–1.00)
Globulin: 1.4 g/dL (calc) — ABNORMAL LOW (ref 1.9–3.7)
Glucose, Bld: 88 mg/dL (ref 65–99)
Potassium: 4 mmol/L (ref 3.5–5.3)
Sodium: 139 mmol/L (ref 135–146)
Total Bilirubin: 0.5 mg/dL (ref 0.2–1.2)
Total Protein: 5.7 g/dL — ABNORMAL LOW (ref 6.1–8.1)
eGFR: 60 mL/min/{1.73_m2} (ref >=60)

## 2020-10-29 LAB — VITAMIN D 25 HYDROXY (VIT D DEFICIENCY, FRACTURES): Vit D, 25-Hydroxy: 79 ng/mL (ref 30–100)

## 2020-10-29 LAB — TSH: TSH: 1.45 mIU/L (ref 0.40–4.50)

## 2020-10-30 ENCOUNTER — Encounter: Payer: Self-pay | Admitting: Nurse Practitioner

## 2020-10-30 DIAGNOSIS — E46 Unspecified protein-calorie malnutrition: Secondary | ICD-10-CM | POA: Insufficient documentation

## 2020-11-11 ENCOUNTER — Other Ambulatory Visit: Payer: Self-pay | Admitting: Internal Medicine

## 2020-11-11 DIAGNOSIS — D0471 Carcinoma in situ of skin of right lower limb, including hip: Secondary | ICD-10-CM | POA: Diagnosis not present

## 2020-11-11 DIAGNOSIS — Z85828 Personal history of other malignant neoplasm of skin: Secondary | ICD-10-CM | POA: Diagnosis not present

## 2020-11-11 DIAGNOSIS — Z1231 Encounter for screening mammogram for malignant neoplasm of breast: Secondary | ICD-10-CM

## 2020-11-11 DIAGNOSIS — L570 Actinic keratosis: Secondary | ICD-10-CM | POA: Diagnosis not present

## 2020-11-15 ENCOUNTER — Other Ambulatory Visit: Payer: Self-pay | Admitting: Adult Health

## 2020-11-15 DIAGNOSIS — F418 Other specified anxiety disorders: Secondary | ICD-10-CM

## 2021-01-01 ENCOUNTER — Other Ambulatory Visit: Payer: Self-pay

## 2021-01-01 ENCOUNTER — Ambulatory Visit
Admission: RE | Admit: 2021-01-01 | Discharge: 2021-01-01 | Disposition: A | Discharge status: Home / Self Care | Payer: PPO | Source: Ambulatory Visit | Attending: Internal Medicine | Admitting: Internal Medicine

## 2021-01-01 DIAGNOSIS — Z1231 Encounter for screening mammogram for malignant neoplasm of breast: Secondary | ICD-10-CM | POA: Diagnosis not present

## 2021-01-10 DIAGNOSIS — S92355A Nondisplaced fracture of fifth metatarsal bone, left foot, initial encounter for closed fracture: Secondary | ICD-10-CM | POA: Diagnosis not present

## 2021-01-10 DIAGNOSIS — S99919A Unspecified injury of unspecified ankle, initial encounter: Secondary | ICD-10-CM | POA: Diagnosis not present

## 2021-01-10 DIAGNOSIS — M25572 Pain in left ankle and joints of left foot: Secondary | ICD-10-CM | POA: Diagnosis not present

## 2021-02-03 ENCOUNTER — Other Ambulatory Visit: Payer: Self-pay | Admitting: Internal Medicine

## 2021-02-11 DIAGNOSIS — S92355A Nondisplaced fracture of fifth metatarsal bone, left foot, initial encounter for closed fracture: Secondary | ICD-10-CM | POA: Diagnosis not present

## 2021-02-11 DIAGNOSIS — M25572 Pain in left ankle and joints of left foot: Secondary | ICD-10-CM | POA: Diagnosis not present

## 2021-03-03 DIAGNOSIS — Z85828 Personal history of other malignant neoplasm of skin: Secondary | ICD-10-CM | POA: Diagnosis not present

## 2021-03-03 DIAGNOSIS — L821 Other seborrheic keratosis: Secondary | ICD-10-CM | POA: Diagnosis not present

## 2021-03-03 DIAGNOSIS — L570 Actinic keratosis: Secondary | ICD-10-CM | POA: Diagnosis not present

## 2021-03-10 ENCOUNTER — Other Ambulatory Visit: Payer: Self-pay | Admitting: Adult Health

## 2021-03-11 ENCOUNTER — Ambulatory Visit (INDEPENDENT_AMBULATORY_CARE_PROVIDER_SITE_OTHER): Payer: PPO | Admitting: Nurse Practitioner

## 2021-03-11 ENCOUNTER — Other Ambulatory Visit: Payer: Self-pay

## 2021-03-11 ENCOUNTER — Encounter: Payer: Self-pay | Admitting: Nurse Practitioner

## 2021-03-11 VITALS — BP 118/72 | HR 98 | Temp 97.9°F | Wt 129.2 lb

## 2021-03-11 DIAGNOSIS — R300 Dysuria: Secondary | ICD-10-CM | POA: Diagnosis not present

## 2021-03-11 MED ORDER — NITROFURANTOIN MONOHYD MACRO 100 MG PO CAPS: 100.0000 mg | ORAL_CAPSULE | Freq: Two times a day (BID) | ORAL | 0 refills | Status: AC

## 2021-03-11 MED ORDER — PHENAZOPYRIDINE HCL 100 MG PO TABS
100.0000 mg | ORAL_TABLET | Freq: Three times a day (TID) | ORAL | 0 refills | Status: AC | PRN
Start: 2021-03-11 — End: 2021-03-14

## 2021-03-11 NOTE — Progress Notes (Signed)
Assessment and Plan:  Kathryn Chavez was seen today for acute visit.  Diagnoses and all orders for this visit:  Dysuria -     Urinalysis, Routine w reflex microscopic -     Urine Culture -     nitrofurantoin, macrocrystal-monohydrate, (MACROBID) 100 MG capsule; Take 1 capsule (100 mg total) by mouth 2 (two) times daily for 7 days. -     phenazopyridine (PYRIDIUM) 100 MG tablet; Take 1 tablet (100 mg total) by mouth 3 (three) times daily as needed for up to 3 days for pain.  Push fluids, if develop fever or inability to pass urine go to the ER    Further disposition pending results of labs. Discussed med's effects and SE's.   Over 30 minutes of exam, counseling, chart review, and critical decision making was performed.   Future Appointments  Date Time Provider [place]  06/02/2021  4:00 PM Magda Bernheim, NP [facility] None  09/01/2021  3:00 PM Magda Bernheim, NP [facility] None    ------------------------------------------------------------------------------------------------------------------   HPI BP 118/72   Pulse 98   Temp 97.9 F (36.6 C)   Wt 129 lb 3.2 oz (58.6 kg)   SpO2 97%   BMI 21.67 kg/m  70 y.o.female presents for burning on urination  Since yesterday she has started having burning on urination. She is urinating more frequently. Denies back pain, and blood in urine.    Past Medical History:  Diagnosis Date   Allergy    rhinitis   Aortic stenosis, moderate    on echo in 2015.   Cancer [facility])    breast   Hyperlipidemia    Hypertension    Personal history of chemotherapy 2000   Personal history of radiation therapy 2000     Allergies  Allergen Reactions   Influenza Vaccines Nausea And Vomiting and Other (See Comments)    dizziness   Other Nausea And Vomiting and Other (See Comments)    dizziness   Penicillamine Hives   Penicillins     REACTION: Hives   Sulfamethoxazole Hives   Sulfonamide Derivatives     REACTION: Hives    Current Outpatient  Medications on File Prior to Visit  Medication Sig   ALPRAZolam (XANAX) 0.5 MG tablet TAKE 1/2 - 1 TABLET UP TO TWICE DAILY AS NEEDED ANX/ SLEEP (LIIMIT USE TO LESS THAN 5 DAYS A WEEK)   buPROPion (WELLBUTRIN XL) 300 MG 24 hr tablet TAKE 1 TABLET EVERY MORNING FOR MOOD, ANXIETY, FOCUS AND CONCENTRATION.   clindamycin (CLEOCIN) 150 MG capsule Take 150 mg by mouth as needed. For dental appts   fluticasone (FLONASE) 50 MCG/ACT nasal spray Place 2 sprays into both nostrils daily.   gabapentin (NEURONTIN) 100 MG capsule TAKE (1) CAPSULE THREE TIMES DAILY.   levothyroxine (SYNTHROID) 50 MCG tablet Take  1 tablet  Daily  on an empty stomach with only water for 30 minutes & no Antacid meds, Calcium or Magnesium for 4 hours & avoid Biotin   rosuvastatin (CRESTOR) 5 MG tablet TAKE ONE TABLET ONCE DAILY FOR Cholesterol   No current facility-administered medications on file prior to visit.    ROS: all negative except above.   Physical Exam:  BP 118/72   Pulse 98   Temp 97.9 F (36.6 C)   Wt 129 lb 3.2 oz (58.6 kg)   SpO2 97%   BMI 21.67 kg/m   General Appearance: Well nourished, in no apparent distress. Eyes: PERRLA, EOMs, conjunctiva no swelling or erythema Sinuses: No Frontal/maxillary  tenderness ENT/Mouth: Ext aud canals clear, TMs without erythema, bulging. No erythema, swelling, or exudate on post pharynx.  Tonsils not swollen or erythematous. Hearing normal.  Neck: Supple, thyroid normal.  Respiratory: Respiratory effort normal, BS equal bilaterally without rales, rhonchi, wheezing or stridor.  Cardio: RRR with no MRGs. Brisk peripheral pulses without edema.  Abdomen: Soft, + BS.  Non tender, no guarding, rebound, hernias, masses. Lymphatics: Non tender without lymphadenopathy.  Musculoskeletal: Full ROM, 5/5 strength, normal gait. Negative CVA tenderness Skin: Warm, dry without rashes, lesions, ecchymosis.  Neuro: Cranial nerves intact. Normal muscle tone, no cerebellar symptoms.  Sensation intact.  Psych: Awake and oriented X 3, normal affect, Insight and Judgment appropriate.     Magda Bernheim, NP 1:59 PM [facility]

## 2021-03-12 LAB — URINALYSIS, ROUTINE W REFLEX MICROSCOPIC
Bacteria, UA: NONE SEEN /HPF
Bilirubin Urine: NEGATIVE
Glucose, UA: NEGATIVE
Hgb urine dipstick: NEGATIVE
Hyaline Cast: NONE SEEN /LPF
Nitrite: NEGATIVE
Protein, ur: NEGATIVE
RBC / HPF: NONE SEEN /HPF (ref 0–2)
Specific Gravity, Urine: 1.025 (ref 1.001–1.035)
pH: 5.5 (ref 5.0–8.0)

## 2021-03-12 LAB — URINE CULTURE
MICRO NUMBER:: 12670789
SPECIMEN QUALITY:: ADEQUATE

## 2021-03-19 ENCOUNTER — Encounter: Payer: Self-pay | Admitting: Nurse Practitioner

## 2021-03-20 ENCOUNTER — Other Ambulatory Visit: Payer: Self-pay | Admitting: Nurse Practitioner

## 2021-03-20 ENCOUNTER — Encounter: Payer: Self-pay | Admitting: Internal Medicine

## 2021-03-20 DIAGNOSIS — R300 Dysuria: Secondary | ICD-10-CM

## 2021-03-21 NOTE — Telephone Encounter (Signed)
Called and left pt a voicemail to call and get an appt scheduled.  Thank you!

## 2021-05-02 ENCOUNTER — Other Ambulatory Visit: Payer: Self-pay | Admitting: Nurse Practitioner

## 2021-05-14 ENCOUNTER — Other Ambulatory Visit: Payer: Self-pay | Admitting: Nurse Practitioner

## 2021-05-14 DIAGNOSIS — R300 Dysuria: Secondary | ICD-10-CM

## 2021-05-29 NOTE — Progress Notes (Addendum)
MEDICARE ANNUAL WELLNESS VISIT AND FOLLOW UP  Assessment:   Encounter for Medicare annual wellness exam 1 year  Essential hypertension At goal without medications at this time Monitor blood pressure at home; call if consistently over 130/80 Continue DASH diet.   Reminder to go to the ER if any CP, SOB, nausea, dizziness, severe HA, changes vision/speech, left arm numbness and tingling and jaw pain. Check CBC  Mixed hyperlipidemia Continue low cholesterol diet and exercise.  Check lipid panel.  Check CMP  Hypothyroidism Please take your thyroid medication greater than 30 min before breakfast, separated by at least 4 hours  from antacids, calcium, iron, and multivitamins.  - TSH  BMI 21.0-21.9, adult Continue to recommend diet heavy in fruits and veggies and low in animal meats, cheeses, and dairy products, appropriate calorie intake Discuss exercise recommendations routinely Continue to monitor weight at each visit  Aortic stenosis, moderate Followed by Dr. Johnsie Cancel  Abnormal Glucose Continue diet and exercise - A1c  Vitamin D deficient osteomalacia Vitamin D at goal at recent check; continue to recommend supplementation for goal of 70-100 Defer vitamin D level   Depression/anxiety Doing well with wellbutrin, continue  Osteopenia, left neck femur Continue vitamin D, add weight bearing exercises.  Get with MGM this year  Medication Management Continued  H/O right breast Cancer Yearly mammograms  Left submandibular adenopathy U/S ordered to evaluate  Over 30 minutes of face to face interview, exam, counseling, chart review and critical decision making was performed Declines labs, will get next OV Future Appointments  Date Time Provider [place]  09/01/2021  8:00 AM Josue Hector, MD [facility]  09/01/2021  3:00 PM Magda Bernheim, NP [facility] None  06/02/2022  4:00 PM Magda Bernheim, NP [facility] None     Plan:   During the course  of the visit the patient was educated and counseled about appropriate screening and preventive services including:   Pneumococcal vaccine  Prevnar 13 Influenza vaccine Td vaccine Screening electrocardiogram Bone densitometry screening Colorectal cancer screening Diabetes screening Glaucoma screening Nutrition counseling  Advanced directives: requested   Subjective:  Kathryn Chavez is a 71 y.o. female who presents for Medicare Annual Wellness Visit and 3 month follow up.    She also has significant hx/o R lumpectomy in 2001 treated with Chemoradiation and followed with 5 years of Arimidex.   She is followed by Dr. Johnsie Cancel for moderate aortic stenosis.   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    She is doing very well on the wellbutrin, she very rarely takes xanax.   BMI is Body mass index is 21.3 kg/m., she has been working on diet and exercise. She is eating well Wt Readings from Last 3 Encounters:  06/02/21 127 lb (57.6 kg)  03/11/21 129 lb 3.2 oz (58.6 kg)  10/28/20 129 lb (58.5 kg)    Her blood pressure has been controlled at home, today their BP is BP: 120/74 BP Readings from Last 3 Encounters:  06/02/21 120/74  03/11/21 118/72  10/28/20 132/80    She does workout. She denies chest pain, shortness of breath, dizziness.   She is on cholesterol medication, increased to 1 tablet of the crestor last visit and denies myalgias. Her cholesterol is not at goal. The cholesterol last visit was:   Lab Results  Component Value Date   CHOL 207 (H) 10/28/2020   HDL  113 10/28/2020   LDLCALC 72 10/28/2020   LDLDIRECT 73.3 04/30/2011   TRIG 139 10/28/2020   CHOLHDL 1.8 10/28/2020    Last A1C in the office was:  Lab Results  Component Value Date   HGBA1C 5.2 01/11/2020   Last GFR: Lab Results  Component Value Date   GFRNONAA 75 07/22/2020   Patient is on Vitamin D  supplement and at goal at recent check:    Lab Results  Component Value Date   VD25OH 79 10/28/2020      Medication Review: Current Outpatient Medications on File Prior to Visit  Medication Sig Dispense Refill   ALPRAZolam (XANAX) 0.5 MG tablet TAKE 1/2 - 1 TABLET UP TO TWICE DAILY AS NEEDED ANXiety/ SLEEP (LIIMIT USE TO LESS THAN 5 DAYS A WEEK) 30 tablet 0   buPROPion (WELLBUTRIN XL) 300 MG 24 hr tablet TAKE 1 TABLET EVERY MORNING FOR MOOD, ANXIETY, FOCUS AND CONCENTRATION. 90 tablet 3   Cholecalciferol (VITAMIN D) 125 MCG (5000 UT) CAPS Take by mouth.     fluticasone (FLONASE) 50 MCG/ACT nasal spray Place 2 sprays into both nostrils daily. 16 g 0   gabapentin (NEURONTIN) 100 MG capsule TAKE (1) CAPSULE THREE TIMES DAILY. 270 capsule 3   levothyroxine (SYNTHROID) 50 MCG tablet Take  1 tablet  Daily  on an empty stomach with only water for 30 minutes & no Antacid meds, Calcium or Magnesium for 4 hours & avoid Biotin 90 tablet 3   rosuvastatin (CRESTOR) 5 MG tablet TAKE ONE TABLET ONCE DAILY FOR Cholesterol 90 tablet 1   clindamycin (CLEOCIN) 150 MG capsule Take 150 mg by mouth as needed. For dental appts     No current facility-administered medications on file prior to visit.    Allergies  Allergen Reactions   Influenza Vaccines Nausea And Vomiting and Other (See Comments)    dizziness   Other Nausea And Vomiting and Other (See Comments)    dizziness   Penicillamine Hives   Penicillins     REACTION: Hives   Sulfamethoxazole Hives   Sulfonamide Derivatives     REACTION: Hives    Current Problems (verified) Patient Active Problem List   Diagnosis Date Noted   Protein-calorie malnutrition ([place]) 10/30/2020   Hypothyroidism 10/02/2019   Osteopenia 11/04/2017   Onychomycosis of great toe 09/08/2017   Aortic stenosis, moderate 09/09/2016   Hyperlipidemia 01/16/2015   Abnormal ECG 01/23/2014   Female cystocele 02/09/2011   Vitamin D deficient osteomalacia 10/28/2010   Essential  hypertension 12/16/2009   Allergic rhinitis 12/16/2009    Screening Tests Immunization History  Administered Date(s) Administered   Influenza Split 02/26/2011, 03/11/2012   Influenza, High Dose Seasonal PF 01/29/2016   Influenza,inj,Quad PF,6+ Mos 05/02/2013   PFIZER(Purple Top)SARS-COV-2 Vaccination 06/12/2019, 07/03/2019   Pneumococcal Conjugate-13 08/05/2017   Pneumococcal Polysaccharide-23 01/16/2015   Tdap 10/28/2010   Health Maintenance  Topic Date Due   Zoster Vaccines- Shingrix (1 of 2) Never done   COVID-19 Vaccine (3 - Pfizer risk series) 07/31/2019   TETANUS/TDAP  10/27/2020   COLONOSCOPY (Pts 45-58yrs Insurance coverage will need to be confirmed)  03/14/2022   MAMMOGRAM  01/02/2023   Pneumonia Vaccine 55+ Years old  Completed   DEXA SCAN  Completed   Hepatitis C Screening  Completed   HPV VACCINES  Aged Out   INFLUENZA VACCINE  Discontinued    Preventative care: Last colonoscopy: 2013 due 02/2022 Last pap smear/pelvic exam: 2010, declines further, hx hysterectomy   DEXA: 12/21/19 osteopenia Echo  02/2018 Mammo 01/01/21  negative  Prior vaccinations: TD or Tdap: 2012, Due discussed with patient  Influenza: 2017 Pneumococcal: 2016 Prevnar13: 2019 Shingles/Zostavax: n/a in office Personal history of COVID: 12/2019  Names of Other Physician/Practitioners you currently use: 1. [facility] here for primary care 2. Dr. [place], eye doctor, last visit 2023 3. [employer], Pharmacist, community, last visit 2023  Patient Care Team: Unk Pinto, MD as PCP - General (Internal Medicine) Josue Hector, MD as PCP - Cardiology (Cardiology) [place], [place], [place] (Optometry)  SURGICAL HISTORY She  has a past surgical history that includes Abdominal hysterectomy; Cystocele repair (N/A, 05/12/2018); and Breast lumpectomy (Right, 2000). FAMILY HISTORY Her family history includes Arthritis in an other family member; Breast cancer in an other  family member; Breast cancer (age of onset: 66) in her daughter; Breast cancer (age of onset: 57) in her daughter; Breast cancer (age of onset: 73) in her mother; Hyperlipidemia in an other family member; Lung cancer in her mother and another family member; Other in her father; Prostate cancer in an other family member. SOCIAL HISTORY She  reports that she has never smoked. She has never used smokeless tobacco. She reports that she does not drink alcohol and does not use drugs.  MEDICARE WELLNESS OBJECTIVES: Physical activity: Current Exercise Habits: Home exercise routine, Type of exercise: walking, Time (Minutes): 40, Frequency (Times/Week): 4, Weekly Exercise (Minutes/Week): 160, Intensity: Mild, Exercise limited by: None identified Cardiac risk factors: Cardiac Risk Factors include: advanced age (>77men, >64 women);dyslipidemia Depression/mood screen:   Depression screen [facility] 2/9 06/02/2021  Decreased Interest 0  Down, Depressed, Hopeless 0  PHQ - 2 Score 0  Altered sleeping -  Tired, decreased energy -  Change in appetite -  Feeling bad or failure about yourself  -  Trouble concentrating -  Moving slowly or fidgety/restless -  Suicidal thoughts -  PHQ-9 Score -  Difficult doing work/chores -    ADLs:  In your present state of health, do you have any difficulty performing the following activities: 06/02/2021  Hearing? N  Vision? N  Difficulty concentrating or making decisions? N  Walking or climbing stairs? N  Dressing or bathing? N  Doing errands, shopping? N  Some recent data might be hidden      Cognitive Testing  Alert? Yes  Normal Appearance?Yes  Oriented to person? Yes  Place? Yes   Time? Yes  Recall of three objects?  Yes  Can perform simple calculations? Yes  Displays appropriate judgment?Yes  Can read the correct time from a watch face?Yes  EOL planning: Does Patient Have a Medical Advance Directive?: Yes Type of Advance Directive: Healthcare Power of Attorney,  Living will Does patient want to make changes to medical advance directive?: No - Patient declined Copy of [place] in Chart?: No - copy requested  Review of Systems  Constitutional:  Negative for chills, fever, malaise/fatigue and weight loss.  HENT:  Negative for congestion, hearing loss and tinnitus.   Eyes:  Negative for blurred vision and double vision.  Respiratory:  Negative for cough, sputum production, shortness of breath and wheezing.   Cardiovascular:  Negative for chest pain, palpitations, orthopnea, claudication, leg swelling and PND.  Gastrointestinal:  Negative for abdominal pain, blood in stool, constipation, diarrhea, heartburn, melena, nausea and vomiting.  Genitourinary: Negative.   Musculoskeletal:  Negative for falls, joint pain and myalgias.  Skin:  Negative for rash.  Neurological:  Negative for dizziness, tingling, tremors, sensory change,  loss of consciousness, weakness and headaches.  Endo/Heme/Allergies:  Negative for polydipsia.  Psychiatric/Behavioral: Negative.  Negative for depression, memory loss, substance abuse and suicidal ideas. The patient is not nervous/anxious and does not have insomnia.   All other systems reviewed and are negative.   Objective:     Today's Vitals   06/02/21 1620  BP: 120/74  Pulse: 88  Temp: (!) 95.6 F (35.3 C)  SpO2: 97%  Weight: 127 lb (57.6 kg)   Body mass index is 21.3 kg/m.  General appearance: alert, no distress, WD/WN, female HEENT: normocephalic, sclerae anicteric, TMs pearly, nares patent, no discharge or erythema, pharynx normal Oral cavity: MMM, no lesions Lymphatics: Left submandibular adenopathy, nontender Neck: supple, no lymphadenopathy, no thyromegaly, no masses Heart: RRR, normal S1, S2, systolic murmur radiation to the carotids. Lungs: CTA bilaterally, no wheezes, rhonchi, or rales Abdomen: +bs, soft, non tender, non distended, no masses, no hepatomegaly, no  splenomegaly Musculoskeletal: nontender, no swelling, no obvious deformity Extremities: no edema, no cyanosis, no clubbing Pulses: 2+ symmetric, upper and lower extremities, normal cap refill Neurological: alert, oriented x 3, CN2-12 intact, strength normal upper extremities and lower extremities, sensation normal throughout, DTRs 2+ throughout, no cerebellar signs, gait normal Psychiatric: normal affect, behavior normal, pleasant   Medicare Attestation I have personally reviewed: The patient's medical and social history Their use of alcohol, tobacco or illicit drugs Their current medications and supplements The patient's functional ability including ADLs,fall risks, home safety risks, cognitive, and hearing and visual impairment Diet and physical activities Evidence for depression or mood disorders  The patient's weight, height, BMI, and visual acuity have been recorded in the chart.  I have made referrals, counseling, and provided education to the patient based on review of the above and I have provided the patient with a written personalized care plan for preventive services.     Magda Bernheim, NP    05/27/2020

## 2021-06-02 ENCOUNTER — Ambulatory Visit: Payer: PPO | Admitting: Adult Health Nurse Practitioner

## 2021-06-02 ENCOUNTER — Ambulatory Visit (INDEPENDENT_AMBULATORY_CARE_PROVIDER_SITE_OTHER): Payer: PPO | Admitting: Nurse Practitioner

## 2021-06-02 ENCOUNTER — Encounter: Payer: Self-pay | Admitting: Nurse Practitioner

## 2021-06-02 ENCOUNTER — Other Ambulatory Visit: Payer: Self-pay

## 2021-06-02 VITALS — BP 120/74 | HR 88 | Temp 95.6°F | Wt 127.0 lb

## 2021-06-02 DIAGNOSIS — Z6821 Body mass index (BMI) 21.0-21.9, adult: Secondary | ICD-10-CM | POA: Diagnosis not present

## 2021-06-02 DIAGNOSIS — E559 Vitamin D deficiency, unspecified: Secondary | ICD-10-CM

## 2021-06-02 DIAGNOSIS — Z0001 Encounter for general adult medical examination with abnormal findings: Secondary | ICD-10-CM | POA: Diagnosis not present

## 2021-06-02 DIAGNOSIS — R7309 Other abnormal glucose: Secondary | ICD-10-CM

## 2021-06-02 DIAGNOSIS — R6889 Other general symptoms and signs: Secondary | ICD-10-CM | POA: Diagnosis not present

## 2021-06-02 DIAGNOSIS — Z853 Personal history of malignant neoplasm of breast: Secondary | ICD-10-CM | POA: Diagnosis not present

## 2021-06-02 DIAGNOSIS — Z79899 Other long term (current) drug therapy: Secondary | ICD-10-CM | POA: Diagnosis not present

## 2021-06-02 DIAGNOSIS — E039 Hypothyroidism, unspecified: Secondary | ICD-10-CM | POA: Diagnosis not present

## 2021-06-02 DIAGNOSIS — M85852 Other specified disorders of bone density and structure, left thigh: Secondary | ICD-10-CM | POA: Diagnosis not present

## 2021-06-02 DIAGNOSIS — I350 Nonrheumatic aortic (valve) stenosis: Secondary | ICD-10-CM | POA: Diagnosis not present

## 2021-06-02 DIAGNOSIS — F418 Other specified anxiety disorders: Secondary | ICD-10-CM | POA: Diagnosis not present

## 2021-06-02 DIAGNOSIS — E782 Mixed hyperlipidemia: Secondary | ICD-10-CM | POA: Diagnosis not present

## 2021-06-02 DIAGNOSIS — Z0000 Encounter for general adult medical examination without abnormal findings: Secondary | ICD-10-CM

## 2021-06-02 DIAGNOSIS — I10 Essential (primary) hypertension: Secondary | ICD-10-CM

## 2021-06-02 DIAGNOSIS — Z6822 Body mass index (BMI) 22.0-22.9, adult: Secondary | ICD-10-CM

## 2021-06-02 DIAGNOSIS — N8110 Cystocele, unspecified: Secondary | ICD-10-CM

## 2021-06-02 DIAGNOSIS — J302 Other seasonal allergic rhinitis: Secondary | ICD-10-CM

## 2021-06-03 ENCOUNTER — Encounter: Payer: Self-pay | Admitting: Nurse Practitioner

## 2021-06-03 ENCOUNTER — Other Ambulatory Visit: Payer: Self-pay | Admitting: Nurse Practitioner

## 2021-06-03 DIAGNOSIS — R590 Localized enlarged lymph nodes: Secondary | ICD-10-CM

## 2021-06-03 LAB — CBC WITH DIFFERENTIAL/PLATELET
Absolute Monocytes: 510 cells/uL (ref 200–950)
Basophils Absolute: 32 cells/uL (ref 0–200)
Basophils Relative: 0.5 %
Eosinophils Absolute: 63 cells/uL (ref 15–500)
Eosinophils Relative: 1 %
HCT: 35.4 % (ref 35.0–45.0)
Hemoglobin: 12 g/dL (ref 11.7–15.5)
Lymphs Abs: 2394 cells/uL (ref 850–3900)
MCH: 32 pg (ref 27.0–33.0)
MCHC: 33.9 g/dL (ref 32.0–36.0)
MCV: 94.4 fL (ref 80.0–100.0)
MPV: 9.4 fL (ref 7.5–12.5)
Monocytes Relative: 8.1 %
Neutro Abs: 3301 cells/uL (ref 1500–7800)
Neutrophils Relative %: 52.4 %
Platelets: 146 10*3/uL (ref 140–400)
RBC: 3.75 10*6/uL — ABNORMAL LOW (ref 3.80–5.10)
RDW: 11.9 % (ref 11.0–15.0)
Total Lymphocyte: 38 %
WBC: 6.3 10*3/uL (ref 3.8–10.8)

## 2021-06-03 LAB — LIPID PANEL
Cholesterol: 217 mg/dL — ABNORMAL HIGH (ref <200)
HDL: 124 mg/dL (ref >=50)
LDL Cholesterol (Calc): 79 mg/dL (calc)
Non-HDL Cholesterol (Calc): 93 mg/dL (calc) (ref <130)
Total CHOL/HDL Ratio: 1.8 (calc) (ref <5.0)
Triglycerides: 67 mg/dL (ref <150)

## 2021-06-03 LAB — COMPLETE METABOLIC PANEL WITH GFR
AG Ratio: 2.5 (calc) (ref 1.0–2.5)
ALT: 14 U/L (ref 6–29)
AST: 20 U/L (ref 10–35)
Albumin: 4.3 g/dL (ref 3.6–5.1)
Alkaline phosphatase (APISO): 91 U/L (ref 37–153)
BUN: 13 mg/dL (ref 7–25)
CO2: 24 mmol/L (ref 20–32)
Calcium: 9.4 mg/dL (ref 8.6–10.4)
Chloride: 98 mmol/L (ref 98–110)
Creat: 0.79 mg/dL (ref 0.60–1.00)
Globulin: 1.7 g/dL (calc) — ABNORMAL LOW (ref 1.9–3.7)
Glucose, Bld: 71 mg/dL (ref 65–99)
Potassium: 3.6 mmol/L (ref 3.5–5.3)
Sodium: 136 mmol/L (ref 135–146)
Total Bilirubin: 0.6 mg/dL (ref 0.2–1.2)
Total Protein: 6 g/dL — ABNORMAL LOW (ref 6.1–8.1)
eGFR: 79 mL/min/{1.73_m2} (ref >=60)

## 2021-06-03 LAB — HEMOGLOBIN A1C
Hgb A1c MFr Bld: 5.3 % of total Hgb (ref <5.7)
Mean Plasma Glucose: 105 mg/dL
eAG (mmol/L): 5.8 mmol/L

## 2021-06-03 LAB — TSH: TSH: 1.44 mIU/L (ref 0.40–4.50)

## 2021-06-04 ENCOUNTER — Ambulatory Visit
Admission: RE | Admit: 2021-06-04 | Discharge: 2021-06-04 | Disposition: A | Discharge status: Home / Self Care | Payer: PPO | Source: Ambulatory Visit | Attending: Nurse Practitioner | Admitting: Nurse Practitioner

## 2021-06-04 DIAGNOSIS — R590 Localized enlarged lymph nodes: Secondary | ICD-10-CM

## 2021-06-09 ENCOUNTER — Other Ambulatory Visit: Payer: Self-pay | Admitting: Nurse Practitioner

## 2021-06-18 ENCOUNTER — Telehealth: Payer: Self-pay | Admitting: Nurse Practitioner

## 2021-06-18 NOTE — Telephone Encounter (Signed)
Pt called and asked if she is due for a Colonoscopy because she is having some discomfort in her stomach and was wanting to see if she was due for it  ?

## 2021-07-03 ENCOUNTER — Telehealth: Payer: Self-pay | Admitting: Nurse Practitioner

## 2021-07-03 NOTE — Telephone Encounter (Signed)
Pt called saying that she thinks has a Hernia, I asked her why and she said that she just has a feeling since a surgery she had 4 years ago. She was wanting a referral but she wasn't sure if she mentioned it to [place] at her last visit. ?

## 2021-07-15 NOTE — Progress Notes (Signed)
Assessment and Plan: ? ?Kathryn Chavez was seen today for acute visit. ? ?Diagnoses and all orders for this visit: ? ?Epigastric pain ?-     CBC with Differential/Platelet ?-     COMPLETE METABOLIC PANEL WITH GFR ?-     DG SWALLOW STUDY OP MEDICARE SPEECH PATH; Future ?Monitor symptoms, if worsens notify the office and will refer to GI ?Will notify of results once they are received.  ?  ? ? ? ?Further disposition pending results of labs. Discussed med's effects and SE's.   ?Over 30 minutes of exam, counseling, chart review, and critical decision making was performed.  ? ?Future Appointments  ?Date Time Provider [place]  ?09/01/2021  8:00 AM Josue Hector, MD [facility]  ?09/01/2021  3:00 PM Magda Bernheim, NP [facility] None  ?06/02/2022  4:00 PM Magda Bernheim, NP [facility] None  ? ? ?------------------------------------------------------------------------------------------------------------------ ? ? ?HPI ?BP 110/80   Pulse 82   Temp (!) 97.2 ?F (36.2 ?C)   Wt 124 lb (56.2 kg)   SpO2 95%   BMI 20.79 kg/m?  ? ?71 y.o.female presents for horrible heartburn which alka seltzer does help for a short period. She feels like there is a fullness epigastric. Denies vomiting. Denies hiccups, frequent burping. No change in bowel habits.  Has been eating much less due to discomfort.  ? ?Last colonoscopy was 02/2012 with Dr. Watt Climes, due this year. ? ?BMI is Body mass index is 20.79 kg/m?., she has been working on diet and exercise. ?Wt Readings from Last 3 Encounters:  ?07/16/21 124 lb (56.2 kg)  ?06/02/21 127 lb (57.6 kg)  ?03/11/21 129 lb 3.2 oz (58.6 kg)  ? ? ? ?Past Medical History:  ?Diagnosis Date  ? Allergy   ? rhinitis  ? Aortic stenosis, moderate   ? on echo in 2015.  ? Cancer [facility])   ? breast  ? Hyperlipidemia   ? Hypertension   ? Personal history of chemotherapy 2000  ? Personal history of radiation therapy 2000  ?  ? ?Allergies  ?Allergen Reactions  ? Influenza Vaccines Nausea And Vomiting and Other  (See Comments)  ?  dizziness  ? Other Nausea And Vomiting and Other (See Comments)  ?  dizziness  ? Penicillamine Hives  ? Penicillins   ?  REACTION: Hives  ? Sulfamethoxazole Hives  ? Sulfonamide Derivatives   ?  REACTION: Hives  ? ? ?Current Outpatient Medications on File Prior to Visit  ?Medication Sig  ? ALPRAZolam (XANAX) 0.5 MG tablet TAKE 1/2 - 1 TABLET UP TO TWICE DAILY AS NEEDED FOR ANXIETY, SLEEP (LIMIT TO LESS THAN 5 DAYS A WEEK)  ? buPROPion (WELLBUTRIN XL) 300 MG 24 hr tablet TAKE 1 TABLET EVERY MORNING FOR MOOD, ANXIETY, FOCUS AND CONCENTRATION.  ? Cholecalciferol (VITAMIN D) 125 MCG (5000 UT) CAPS Take by mouth.  ? clindamycin (CLEOCIN) 150 MG capsule Take 150 mg by mouth as needed. For dental appts  ? fluticasone (FLONASE) 50 MCG/ACT nasal spray Place 2 sprays into both nostrils daily.  ? gabapentin (NEURONTIN) 100 MG capsule TAKE (1) CAPSULE THREE TIMES DAILY.  ? levothyroxine (SYNTHROID) 50 MCG tablet Take  1 tablet  Daily  on an empty stomach with only water for 30 minutes & no Antacid meds, Calcium or Magnesium for 4 hours & avoid Biotin  ? rosuvastatin (CRESTOR) 5 MG tablet TAKE ONE TABLET ONCE DAILY FOR Cholesterol  ? ?No current facility-administered medications on file prior to visit.  ? ? ?ROS: all  negative except above.  ? ?Physical Exam: ? ?BP 110/80   Pulse 82   Temp (!) 97.2 ?F (36.2 ?C)   Wt 124 lb (56.2 kg)   SpO2 95%   BMI 20.79 kg/m?  ? ?General Appearance: Well nourished, in no apparent distress. ?Eyes: PERRLA, EOMs, conjunctiva no swelling or erythema ?Sinuses: No Frontal/maxillary tenderness ?ENT/Mouth: Ext aud canals clear, TMs without erythema, bulging. No erythema, swelling, or exudate on post pharynx.  Tonsils not swollen or erythematous. Hearing normal.  ?Neck: Supple, thyroid normal.  ?Respiratory: Respiratory effort normal, BS equal bilaterally without rales, rhonchi, wheezing or stridor.  ?Cardio: RRR with no MRGs. Brisk peripheral pulses without edema.  ?Abdomen:  Soft, + BS. Tenderness in epigastric area and mild swelling ?Lymphatics: Non tender without lymphadenopathy.  ?Musculoskeletal: Full ROM, 5/5 strength, normal gait.  ?Skin: Warm, dry without rashes, lesions, ecchymosis.  ?Neuro: Cranial nerves intact. Normal muscle tone, no cerebellar symptoms. Sensation intact.  ?Psych: Awake and oriented X 3, normal affect, Insight and Judgment appropriate.  ?  ? ?Magda Bernheim, NP ?3:36 PM ?[facility] ? ?

## 2021-07-16 ENCOUNTER — Encounter: Payer: Self-pay | Admitting: Nurse Practitioner

## 2021-07-16 ENCOUNTER — Ambulatory Visit (INDEPENDENT_AMBULATORY_CARE_PROVIDER_SITE_OTHER): Payer: PPO | Admitting: Nurse Practitioner

## 2021-07-16 VITALS — BP 110/80 | HR 82 | Temp 97.2°F | Wt 124.0 lb

## 2021-07-16 DIAGNOSIS — R1013 Epigastric pain: Secondary | ICD-10-CM

## 2021-07-16 NOTE — Patient Instructions (Signed)
Barium Swallow ?A barium swallow is an X-ray test that is used to check: ?Your throat. ?The part of your body that moves food from your mouth to your stomach (esophagus). ?Your stomach. ?For this test, you will drink a white liquid called barium. The liquid shows up well on X-rays and helps your doctor see problems. ?Tell a doctor about: ?Any allergies you have. This should include any allergies to substances that are used in X-ray tests (contrast materials). ?All medicines you are taking, including vitamins, herbs, eye drops, creams, and over-the-counter medicines. ?Any surgeries you have had. ?Any medical conditions you have. ?Whether you are pregnant or may be pregnant. ?What are the risks? ?Generally, this is a safe test. However, problems may occur. These may include: ?Trouble pooping. ?A small amount of high-energy rays (radiation) going in your body. ?Allergic reaction to the liquid that you drink for the test. ?What happens before the procedure? ?Follow your doctor's instructions about limiting what you eat or drink. ?Ask your doctor about changing or stopping your normal medicines. This is important if you take diabetes medicines or blood thinners. ?What happens during the procedure? ?You will be positioned on an X-ray table. ?You will drink the liquid barium. This liquid looks like a milkshake. ?The X-ray table may be moved so you are sitting up more. You may also need to change your position on the table. ?X-ray pictures will be shown on a screen (monitor). This lets the doctor watch the barium as it passes through your body. ?The procedure may vary among doctors and hospitals. ?What happens after the procedure? ?Your poop (feces) may be white or gray for 2-3 days. You may be given a medicine that helps you poop (laxative). This helps the barium leave your body. ?Ask your doctor when and how you will get your test results. ?Talk with your doctor about what your results mean. ?Follow these instructions at  home: ? ?Go back to your normal activities and your normal diet as told by your doctor. ?To help you not have trouble pooping after this test, your doctor may tell you to: ?Drink enough fluid to keep your pee (urine) pale yellow. ?Eat foods that have a lot of fiber in them. These include fruits, vegetables, whole grains, and beans. ?Limit foods that are high in fat and sugars. These include fried or sweet foods. ?Take an over-the-counter or prescription medicine. ?Contact a doctor if you: ?Have trouble pooping. ?Cannot poop or pass gas. ?Have pain or swelling in your belly. ?Have a fever. ?Summary ?A barium swallow is an X-ray test that is used to check your throat, your stomach, and the part of your body that moves food from your mouth to your stomach. ?You will drink a white liquid called barium that shows up well on X-rays. ?Talk with your doctor about what your results mean. ?This information is not intended to replace advice given to you by your health care provider. Make sure you discuss any questions you have with your health care provider. ?Document Revised: 12/09/2020 Document Reviewed: 03/07/2020 ?Elsevier Patient Education ? [place]. ? ?

## 2021-07-17 LAB — COMPLETE METABOLIC PANEL WITH GFR
AG Ratio: 2.4 (calc) (ref 1.0–2.5)
ALT: 17 U/L (ref 6–29)
AST: 19 U/L (ref 10–35)
Albumin: 4.3 g/dL (ref 3.6–5.1)
Alkaline phosphatase (APISO): 99 U/L (ref 37–153)
BUN/Creatinine Ratio: 18 (calc) (ref 6–22)
BUN: 19 mg/dL (ref 7–25)
CO2: 28 mmol/L (ref 20–32)
Calcium: 9.5 mg/dL (ref 8.6–10.4)
Chloride: 102 mmol/L (ref 98–110)
Creat: 1.06 mg/dL — ABNORMAL HIGH (ref 0.60–1.00)
Globulin: 1.8 g/dL (calc) — ABNORMAL LOW (ref 1.9–3.7)
Glucose, Bld: 87 mg/dL (ref 65–99)
Potassium: 3.9 mmol/L (ref 3.5–5.3)
Sodium: 141 mmol/L (ref 135–146)
Total Bilirubin: 0.4 mg/dL (ref 0.2–1.2)
Total Protein: 6.1 g/dL (ref 6.1–8.1)
eGFR: 56 mL/min/{1.73_m2} — ABNORMAL LOW (ref >=60)

## 2021-07-17 LAB — CBC WITH DIFFERENTIAL/PLATELET
Absolute Monocytes: 573 cells/uL (ref 200–950)
Basophils Absolute: 31 cells/uL (ref 0–200)
Basophils Relative: 0.5 %
Eosinophils Absolute: 61 cells/uL (ref 15–500)
Eosinophils Relative: 1 %
HCT: 36.7 % (ref 35.0–45.0)
Hemoglobin: 12.5 g/dL (ref 11.7–15.5)
Lymphs Abs: 2696 cells/uL (ref 850–3900)
MCH: 32.4 pg (ref 27.0–33.0)
MCHC: 34.1 g/dL (ref 32.0–36.0)
MCV: 95.1 fL (ref 80.0–100.0)
MPV: 9.3 fL (ref 7.5–12.5)
Monocytes Relative: 9.4 %
Neutro Abs: 2739 cells/uL (ref 1500–7800)
Neutrophils Relative %: 44.9 %
Platelets: 174 10*3/uL (ref 140–400)
RBC: 3.86 10*6/uL (ref 3.80–5.10)
RDW: 12.1 % (ref 11.0–15.0)
Total Lymphocyte: 44.2 %
WBC: 6.1 10*3/uL (ref 3.8–10.8)

## 2021-07-18 ENCOUNTER — Other Ambulatory Visit: Payer: Self-pay | Admitting: Nurse Practitioner

## 2021-07-18 DIAGNOSIS — R1013 Epigastric pain: Secondary | ICD-10-CM

## 2021-07-22 ENCOUNTER — Encounter: Payer: PPO | Admitting: Adult Health Nurse Practitioner

## 2021-07-30 ENCOUNTER — Ambulatory Visit (HOSPITAL_COMMUNITY)
Admission: RE | Admit: 2021-07-30 | Discharge: 2021-07-30 | Disposition: A | Discharge status: Home / Self Care | Payer: PPO | Source: Ambulatory Visit | Attending: Nurse Practitioner | Admitting: Nurse Practitioner

## 2021-07-30 ENCOUNTER — Encounter (HOSPITAL_COMMUNITY): Payer: Self-pay

## 2021-07-30 ENCOUNTER — Other Ambulatory Visit: Payer: Self-pay | Admitting: Nurse Practitioner

## 2021-07-30 ENCOUNTER — Telehealth (HOSPITAL_COMMUNITY): Payer: Self-pay

## 2021-07-30 ENCOUNTER — Encounter: Payer: Self-pay | Admitting: Nurse Practitioner

## 2021-07-30 DIAGNOSIS — R1013 Epigastric pain: Secondary | ICD-10-CM

## 2021-07-30 DIAGNOSIS — R590 Localized enlarged lymph nodes: Secondary | ICD-10-CM

## 2021-07-30 NOTE — Telephone Encounter (Signed)
Pt showed up for her MBS - but she realized this examines the oropharyngeal swallow and she reported this is not her area of concern. She politely declined exam. ? ?SLP Tammy - Sent secure chat to NP to place correct test which is an Esophogram/Barium Swallow. ?

## 2021-07-31 ENCOUNTER — Encounter: Payer: Self-pay | Admitting: Nurse Practitioner

## 2021-08-01 ENCOUNTER — Other Ambulatory Visit: Payer: Self-pay | Admitting: Nurse Practitioner

## 2021-08-01 ENCOUNTER — Telehealth: Payer: Self-pay | Admitting: Nurse Practitioner

## 2021-08-01 ENCOUNTER — Encounter: Payer: Self-pay | Admitting: Gastroenterology

## 2021-08-01 DIAGNOSIS — R1013 Epigastric pain: Secondary | ICD-10-CM

## 2021-08-01 NOTE — Telephone Encounter (Signed)
Pt said she was sent for the wrong test, that she wasn't supposed to have a swallow test done. She is wanting to speak to you on the phone. ?

## 2021-08-04 ENCOUNTER — Other Ambulatory Visit: Payer: Self-pay | Admitting: Internal Medicine

## 2021-08-04 ENCOUNTER — Other Ambulatory Visit: Payer: Self-pay | Admitting: Nurse Practitioner

## 2021-08-04 DIAGNOSIS — R1013 Epigastric pain: Secondary | ICD-10-CM

## 2021-08-06 ENCOUNTER — Other Ambulatory Visit: Payer: Self-pay | Admitting: Nurse Practitioner

## 2021-08-13 DIAGNOSIS — Z85828 Personal history of other malignant neoplasm of skin: Secondary | ICD-10-CM | POA: Diagnosis not present

## 2021-08-13 DIAGNOSIS — D485 Neoplasm of uncertain behavior of skin: Secondary | ICD-10-CM | POA: Diagnosis not present

## 2021-08-13 DIAGNOSIS — D0471 Carcinoma in situ of skin of right lower limb, including hip: Secondary | ICD-10-CM | POA: Diagnosis not present

## 2021-08-19 NOTE — Progress Notes (Signed)
? ?Cardiology Office Note   ? ?Date:  09/01/2021  ? ?ID:  Berklee Battey, DOB Feb 06, 1951, MRN [id number] ? ?PCP:  Unk Pinto, MD  ?Cardiologist:  Dr. Johnsie Cancel  ? ?Chief Complaint: Aortic Stenosis  ? ?History of Present Illness:  ? ?Kathryn Chavez is a 71 y.o. female with hx of HTN, HLD, breast cancer and aortic stenosis Her BP and lipids are diet controlled  ? ?Works at [employer]  ? ?Reviewed TTE done 02/22/18 EF 60-65% moderate AS mean gradient 19 peak 34 mmHg And now moderate AR  ?Reviewed TTE done 05/15/20 EF 60-65% moderate AS mean grdient 20 mmhg peak 35 mmHg DVI 0.28 AVA 1.1 cm2 and moderate AR  ?Reviewed TTE done 08/29/21  EF 60-65% moderate AS mean gradient 26.7 mmHg peak 45.3 mmHg DVI 0.32 AVA 0.9 cm2 and mild/moderate AR  ? ? ? ?No chest pain, dyspnea, palpitations or syncope ? ?Enjoys a glass of wine every night  ? ?Living at [facility] since [facility]  ? ?Past Medical History:  ?Diagnosis Date  ? Allergy   ? rhinitis  ? Aortic stenosis, moderate   ? on echo in 2015.  ? Cancer [facility])   ? breast  ? Hyperlipidemia   ? Hypertension   ? Personal history of chemotherapy 2000  ? Personal history of radiation therapy 2000  ? ? ?Past Surgical History:  ?Procedure Laterality Date  ? ABDOMINAL HYSTERECTOMY    ? BREAST LUMPECTOMY Right 2000  ? malignant  ? CYSTOCELE REPAIR N/A 05/12/2018  ? ? ?Current Medications: ?Prior to Admission medications   ?Medication Sig Start Date End Date Taking? Authorizing Provider  ?ALPRAZolam (XANAX XR) 0.5 MG 24 hr tablet Take 1 tablet (0.5 mg total) by mouth daily. 08/20/16   Vicie Mutters, PA-C  ?azelastine (ASTELIN) 0.1 % nasal spray Place 2 sprays into both nostrils 2 (two) times daily. Use in each nostril as directed 12/02/15 12/01/16  Forcucci, Loma Sousa, PA-C  ?fluticasone (FLONASE) 50 MCG/ACT nasal spray Place 2 sprays into both nostrils daily. 05/26/16   Forcucci, Courtney, PA-C  ?gabapentin (NEURONTIN) 100 MG capsule TAKE (1) CAPSULE THREE TIMES DAILY. 08/19/16    Vicie Mutters, PA-C  ? ? ?Allergies:   Influenza vaccines, Other, Penicillamine, Penicillins, Sulfamethoxazole, and Sulfonamide derivatives  ? ?Social History  ? ?Socioeconomic History  ? Marital status: Single  ?  Spouse name: Not on file  ? Number of children: Not on file  ? Years of education: Not on file  ? Highest education level: Not on file  ?Occupational History  ? Not on file  ?Tobacco Use  ? Smoking status: Never  ? Smokeless tobacco: Never  ?Vaping Use  ? Vaping Use: Never used  ?Substance and Sexual Activity  ? Alcohol use: No  ?  Comment: social  ? Drug use: No  ? Sexual activity: Yes  ?  Partners: Male  ?  Birth control/protection: Surgical  ?Other Topics Concern  ? Not on file  ?Social History Narrative  ? Not on file  ? ?Social Determinants of Health  ? ?Financial Resource Strain: Not on file  ?Food Insecurity: Not on file  ?Transportation Needs: Not on file  ?Physical Activity: Not on file  ?Stress: Not on file  ?Social Connections: Not on file  ?  ? ?Family History:  The patient's family history includes Arthritis in an other family member; Breast cancer in an other family member; Breast cancer (age of onset: 53) in her daughter; Breast cancer (age of onset:  63) in her daughter; Breast cancer (age of onset: 26) in her mother; Hyperlipidemia in an other family member; Lung cancer in her mother and another family member; Other in her father; Prostate cancer in an other family member.  ? ?ROS:   ?Please see the history of present illness.    ?ROS All other systems reviewed and are negative. ? ? ?PHYSICAL EXAM:   ?VS:  BP 120/72   Pulse 74   Ht '5\' 6"'$  (1.676 m)   Wt 122 lb (55.3 kg)   SpO2 96%   BMI 19.69 kg/m?    ?Affect appropriate ?Healthy:  appears stated age ?HEENT: normal ?Neck supple with no adenopathy ?JVP normal no bruits no thyromegaly ?Lungs clear with no wheezing and good diaphragmatic motion ?Heart:  S1/S2 AS  murmur, no rub, gallop or click ?PMI normal ?Abdomen: benighn, BS positve,  no tenderness, no AAA ?no bruit.  No HSM or HJR ?Distal pulses intact with no bruits ?No edema ?Neuro non-focal ?Skin warm and dry ?No muscular weakness ? ? ?Wt Readings from Last 3 Encounters:  ?09/01/21 122 lb (55.3 kg)  ?07/16/21 124 lb (56.2 kg)  ?06/02/21 127 lb (57.6 kg)  ?  ? ? ?Studies/Labs Reviewed:  ? ?EKG:   06/27/19 SR Rate 60 ICRBBB low voltage 09/01/2021 SR rate 74 normal  ? ?Recent Labs: ?06/02/2021: TSH 1.44 ?07/16/2021: ALT 17; BUN 19; Creat 1.06; Hemoglobin 12.5; Platelets 174; Potassium 3.9; Sodium 141  ? ?Lipid Panel ?   ?Component Value Date/Time  ? CHOL 217 (H) 06/02/2021 1650  ? TRIG 67 06/02/2021 1650  ? HDL 124 06/02/2021 1650  ? CHOLHDL 1.8 06/02/2021 1650  ? VLDL 24 09/09/2016 1017  ? [place] 79 06/02/2021 1650  ? LDLDIRECT 73.3 04/30/2011 0954  ? ? ? ? ?ASSESSMENT & PLAN:  ? ? ?1. Moderate aortic stenosis ?-  some progression but not surgical with compensated LV    asymptomatic  ? ?-Discussed utility of cardiac CTA to assess AV calcium score, aortic root and r/o CAD ?-50 mg lopressor and nitro for scan Do as TAVR protocol retrospective to see valve motion ?And use nitro  ? ?2. Hypertension ?- Stable and well controlled without any medical regimen. ? ?3. Hyperlipidemia ?- She is not on any medication. Recent lipid profile was normal. ? ?4. COVID:  Infection September 2021 received monoclonal infusion  And first 2 vaccines will wait on booster normal lung exam ? ?5. Thyroid:  On replacement TSH normal 01/11/20  ? ? ? ?Jenkins Rouge ? ?

## 2021-08-20 ENCOUNTER — Encounter (HOSPITAL_COMMUNITY): Payer: Self-pay

## 2021-08-20 ENCOUNTER — Telehealth: Payer: Self-pay

## 2021-08-20 DIAGNOSIS — I351 Nonrheumatic aortic (valve) insufficiency: Secondary | ICD-10-CM

## 2021-08-20 DIAGNOSIS — I350 Nonrheumatic aortic (valve) stenosis: Secondary | ICD-10-CM

## 2021-08-20 NOTE — Telephone Encounter (Signed)
-----   Message from Josue Hector, MD sent at 08/19/2021  1:46 PM EDT ----- ?Seeing her 5/15 can we get an echo done before that for f/u AS/AR  ? ?

## 2021-08-28 ENCOUNTER — Other Ambulatory Visit: Payer: Self-pay | Admitting: Adult Health

## 2021-08-29 ENCOUNTER — Ambulatory Visit (HOSPITAL_COMMUNITY): Payer: PPO | Attending: Cardiology

## 2021-08-29 DIAGNOSIS — I350 Nonrheumatic aortic (valve) stenosis: Secondary | ICD-10-CM | POA: Insufficient documentation

## 2021-08-29 DIAGNOSIS — I351 Nonrheumatic aortic (valve) insufficiency: Secondary | ICD-10-CM | POA: Insufficient documentation

## 2021-08-29 LAB — ECHOCARDIOGRAM COMPLETE
AR max vel: 0.84 cm2
AV Area VTI: 0.9 cm2
AV Area mean vel: 0.8 cm2
AV Mean grad: 26.7 mmHg
AV Peak grad: 45.3 mmHg
Ao pk vel: 3.37 m/s
Area-P 1/2: 1.96 cm2
P 1/2 time: 465 msec
S' Lateral: 2.2 cm

## 2021-08-31 NOTE — Progress Notes (Signed)
COMPLETE PHYSICAL ? ? ?Assessment:  ? ?Encounter for routine medical examination ?1 year ?   ?Essential hypertension ?At goal without medications at this time ?Monitor blood pressure at home; call if consistently over 130/80 ?Continue DASH diet.   ?Reminder to go to the ER if any CP, SOB, nausea, dizziness, severe HA, changes vision/speech, left arm numbness and tingling and jaw pain. ? ?Mixed hyperlipidemia ?Continue low cholesterol diet , Rosuvastatin 5 mg and exercise.  ?Check lipid panel.  ? ?BMI 22.0-22.9, adult ?Continue to recommend diet heavy in fruits and veggies and low in animal meats, cheeses, and dairy products, appropriate calorie intake ?Discuss exercise recommendations routinely ?Continue to monitor weight at each visit ? ?Aortic stenosis, moderate ?Followed by Dr. Johnsie Cancel ? ?Abnormal Glucose ?Continue diet and exercise ?- A1c ? ?Seasonal allergic rhinitis, unspecified trigger ?- Allegra OTC, increase H20, allergy hygiene explained. ? ?Vitamin D deficient osteomalacia ?Vitamin D at goal at recent check; continue to recommend supplementation for goal of 70-100 ?Defer vitamin D level ?  ?Hypothyroidism ?Taking levothyroxine 88 mcg daily ?Reminder to take on an empty stomach 30-66mns before first meal of the day. ?No antacid medications for 4 hours. ? ?Depression/anxiety ?Doing well with wellbutrin, continue ? ?Osteopenia, left neck femur ?Continue vitamin D, add weight bearing exercises.  ? ? ?Medication Management ?Continued ? ?Screening for hematuria/proteinuria ?- Routine UA with reflex microscopic ?- Microalbumin/creatinine urine ratio ? ?Epigastric Pain ?Has improved with use of Slippery Root for health store ?Keep follow up appointment with Dr. DLoletha Carrow? ? ?Over 30 minutes of face to face interview, exam, counseling, chart review and critical decision making was performed ?Declines labs, will get next OV ?Future Appointments  ?Date Time Provider [place] ?09/10/2021  9:00 AM Danis, HKirke Corin MD [facility]  ?06/02/2022  4:00 PM MMagda Bernheim NP [facility] None  ?09/02/2022  3:00 PM Vercie Pokorny, DTownsend Roger NP [facility] None  ? ? ? ? ?Subjective:  ?Kathryn GREENHOUSEis a 71y.o. female who presents for complete physical. ? ?She has been having an increase in allergy symptoms.  She has switched to xyzal one day ago.  She is not sure if this is helping yet or not.  She is also using Flonase and rotates with Nasacort. ? ? ?She also has significant hx/o R lumpectomy in 2001 treated with Chemo radiation and followed with 5 years of Arimidex.  ? ?She is followed by Dr. NJohnsie Cancelfor moderate aortic stenosis. She was evaluated this AM and a cardiac calcium score CTA is ordered. To take lopressor 50 mg and nitro prior to procedure. Will send lab results from today to Dr NJohnsie Cancel? 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   ? ?She is doing very well on the wellbutrin, she very rarely takes xanax.  ? ?BMI is Body mass index is 21.43 kg/m?., she has been working on diet and exercise. She has been eating better and epigastric pain is improved with u[place]of "slippery root" herb from health store ?Wt Readings from Last 3 Encounters:  ?09/01/21 126 lb 12.8 oz (57.5 kg)  ?09/01/21 122 lb (55.3 kg)  ?07/16/21 124 lb (56.2 kg)  ? ? Her blood pressure has been controlled at home, today their BP is BP: 114/68   ?BP Readings from Last 3 Encounters:  ?09/01/21 114/68  ?09/01/21 120/72  ?07/16/21 110/80  ?She does workout. She  denies chest pain, shortness of breath, dizziness.  ? ? ?She is on cholesterol medication, increased to 1 tablet of the crestor '5mg'$  and denies myalgias. Her cholesterol is at goal. The cholesterol last visit was:   ?Lab Results  ?Component Value Date  ? CHOL 217 (H) 06/02/2021  ? HDL 124 06/02/2021  ? [place] 79 06/02/2021  ? LDLDIRECT 73.3 04/30/2011  ? TRIG 67 06/02/2021  ? CHOLHDL 1.8 06/02/2021  ? ? Last A1C in  the office was:  ?Lab Results  ?Component Value Date  ? HGBA1C 5.3 06/02/2021  ? ?Last GFR: ?Lab Results  ?Component Value Date  ? GFRNONAA 75 07/22/2020  ? ?Patient is on Vitamin D supplement and at goal at recent check:    ?Lab Results  ?Component Value Date  ? VD25OH 79 10/28/2020  ?   ? ?Medication Review: ?Current Outpatient Medications on File Prior to Visit  ?Medication Sig Dispense Refill  ? ALPRAZolam (XANAX) 0.5 MG tablet TAKE 1/2 - 1 TABLET UP TO TWICE DAILY AS NEEDED FOR ANXIETY, SLEEP (LIMIT TO LESS THAN 5 DAYS A WEEK) 30 tablet 0  ? buPROPion (WELLBUTRIN XL) 300 MG 24 hr tablet TAKE 1 TABLET EVERY MORNING FOR MOOD, ANXIETY, FOCUS AND CONCENTRATION. 90 tablet 3  ? Cholecalciferol (VITAMIN D) 125 MCG (5000 UT) CAPS Take by mouth.    ? fluticasone (FLONASE) 50 MCG/ACT nasal spray Place 2 sprays into both nostrils daily. 16 g 0  ? gabapentin (NEURONTIN) 100 MG capsule TAKE ONE CAPSULE THREE TIMES DAILY. 270 capsule 3  ? levothyroxine (SYNTHROID) 50 MCG tablet Take  1 tablet  Daily  on an empty stomach with only water for 30 minutes & no Antacid meds, Calcium or Magnesium for 4 hours & avoid Biotin 90 tablet 3  ? rosuvastatin (CRESTOR) 5 MG tablet TAKE ONE TABLET ONCE DAILY FOR Cholesterol 90 tablet 1  ? ?No current facility-administered medications on file prior to visit.  ? ? ?Allergies  ?Allergen Reactions  ? Influenza Vaccines Nausea And Vomiting and Other (See Comments)  ?  dizziness  ? Other Nausea And Vomiting and Other (See Comments)  ?  dizziness  ? Penicillamine Hives  ? Penicillins   ?  REACTION: Hives  ? Sulfamethoxazole Hives  ? Sulfonamide Derivatives   ?  REACTION: Hives  ? ? ?Current Problems (verified) ?Patient Active Problem List  ? Diagnosis Date Noted  ? Protein-calorie malnutrition ([place]) 10/30/2020  ? Hypothyroidism 10/02/2019  ? Osteopenia 11/04/2017  ? Onychomycosis of great toe 09/08/2017  ? Aortic stenosis, moderate 09/09/2016  ? Hyperlipidemia 01/16/2015  ? Abnormal ECG 01/23/2014   ? Female cystocele 02/09/2011  ? Vitamin D deficient osteomalacia 10/28/2010  ? Essential hypertension 12/16/2009  ? Allergic rhinitis 12/16/2009  ? ? ?Screening Tests ?Immunization History  ?Administered Date(s) Administered  ? Influenza Split 02/26/2011, 03/11/2012  ? Influenza, High Dose Seasonal PF 01/29/2016  ? Influenza,inj,Quad PF,6+ Mos 05/02/2013  ? PFIZER(Purple Top)SARS-COV-2 Vaccination 06/12/2019, 07/03/2019  ? Pneumococcal Conjugate-13 08/05/2017  ? Pneumococcal Polysaccharide-23 01/16/2015  ? Tdap 10/28/2010  ? ?Preventative care: ?Last colonoscopy: 2013 Dr Watt Climes , no further per patient, discussed ?Last mammogram: 01/01/21 ?Last pap smear/pelvic exam: 2010, declines further, hx hysterectomy   ?DEXA: 2021 right femur neck T score - 1.4 osteopenia ?Echo 04/2020 ? ?Prior vaccinations: ?TD or Tdap: 2012, Due discussed with patient  ?Influenza: 2017 ?Pneumococcal: 2016 ?[id number]: 2019 ?Shingles/Zostavax: n/a in office ?Personal history of COVID: 12/2019 ? ?Names of Other Physician/Practitioners you currently use: ?1. [facility] here for primary care ?2. Dr. [place], eye doctor, 2022 ?3. Dr. Marland Kitchen , dentist, last visit 2018 due for follow up  ? ?Patient Care Team: ?Unk Pinto, MD as PCP - General (Internal Medicine) ?Josue Hector, MD as PCP - Cardiology (Cardiology) ?[place], Nira Conn, [place] (Optometry) ? ?SURGICAL HISTORY ?She  has a past surgical history that includes Abdominal hysterectomy; Cystocele repair (N/A, 05/12/2018); and Breast lumpectomy (Right, 2000). ?FAMILY HISTORY ?Her family history includes Arthritis in an other family member; Breast cancer in an other family member; Breast cancer (age of onset: 35) in her daughter; Breast cancer (age of onset: 48) in her daughter; Breast cancer (age of onset: 12) in her mother; Hyperlipidemia in an other family member; Lung cancer in her mother and another family member; Other in her father; Prostate cancer in an other  family member. ?SOCIAL HISTORY ?She  reports that she has never smoked. She has never used smokeless tobacco. She reports that she does not drink alcohol and does not use drugs. ? ? ?Review of Systems

## 2021-09-01 ENCOUNTER — Ambulatory Visit: Payer: PPO | Admitting: Cardiovascular Disease

## 2021-09-01 ENCOUNTER — Encounter: Payer: Self-pay | Admitting: Cardiovascular Disease

## 2021-09-01 ENCOUNTER — Ambulatory Visit (INDEPENDENT_AMBULATORY_CARE_PROVIDER_SITE_OTHER): Payer: PPO | Admitting: Nurse Practitioner

## 2021-09-01 ENCOUNTER — Encounter: Payer: Self-pay | Admitting: Nurse Practitioner

## 2021-09-01 VITALS — BP 120/72 | HR 74 | Ht 66.0 in | Wt 122.0 lb

## 2021-09-01 VITALS — BP 114/68 | HR 58 | Temp 97.3°F | Ht 64.5 in | Wt 126.8 lb

## 2021-09-01 DIAGNOSIS — R7309 Other abnormal glucose: Secondary | ICD-10-CM | POA: Diagnosis not present

## 2021-09-01 DIAGNOSIS — Z1389 Encounter for screening for other disorder: Secondary | ICD-10-CM | POA: Diagnosis not present

## 2021-09-01 DIAGNOSIS — I350 Nonrheumatic aortic (valve) stenosis: Secondary | ICD-10-CM

## 2021-09-01 DIAGNOSIS — E039 Hypothyroidism, unspecified: Secondary | ICD-10-CM

## 2021-09-01 DIAGNOSIS — I10 Essential (primary) hypertension: Secondary | ICD-10-CM | POA: Diagnosis not present

## 2021-09-01 DIAGNOSIS — E782 Mixed hyperlipidemia: Secondary | ICD-10-CM

## 2021-09-01 DIAGNOSIS — E559 Vitamin D deficiency, unspecified: Secondary | ICD-10-CM

## 2021-09-01 DIAGNOSIS — R1013 Epigastric pain: Secondary | ICD-10-CM

## 2021-09-01 DIAGNOSIS — Z6822 Body mass index (BMI) 22.0-22.9, adult: Secondary | ICD-10-CM

## 2021-09-01 DIAGNOSIS — Z79899 Other long term (current) drug therapy: Secondary | ICD-10-CM

## 2021-09-01 DIAGNOSIS — F418 Other specified anxiety disorders: Secondary | ICD-10-CM

## 2021-09-01 DIAGNOSIS — M85852 Other specified disorders of bone density and structure, left thigh: Secondary | ICD-10-CM

## 2021-09-01 DIAGNOSIS — R072 Precordial pain: Secondary | ICD-10-CM

## 2021-09-01 DIAGNOSIS — Z136 Encounter for screening for cardiovascular disorders: Secondary | ICD-10-CM

## 2021-09-01 DIAGNOSIS — Z0000 Encounter for general adult medical examination without abnormal findings: Secondary | ICD-10-CM | POA: Diagnosis not present

## 2021-09-01 DIAGNOSIS — Z0001 Encounter for general adult medical examination with abnormal findings: Secondary | ICD-10-CM

## 2021-09-01 MED ORDER — METOPROLOL TARTRATE 50 MG PO TABS: ORAL_TABLET | ORAL | 0 refills | Status: DC

## 2021-09-01 NOTE — Patient Instructions (Addendum)
Medication Instructions:  ?Your physician recommends that you continue on your current medications as directed. Please refer to the Current Medication list given to you today. ? ?*If you need a refill on your cardiac medications before your next appointment, please call your pharmacy* ? ?Lab Work: ?Your physician recommends that you have lab work today- BMET ? ?If you have labs (blood work) drawn today and your tests are completely normal, you will receive your results only by: ?MyChart Message (if you have MyChart) OR ?A paper copy in the mail ?If you have any lab test that is abnormal or we need to change your treatment, we will call you to review the results. ? ?Testing/Procedures: ?Your physician has requested that you have cardiac CT. Cardiac computed tomography (CT) is a painless test that uses an x-ray machine to take clear, detailed pictures of your heart. For further information please visit [email or web address]. Please follow instruction sheet as given. ? ?Follow-Up: ?At [facility], you and your health needs are our priority.  As part of our continuing mission to provide you with exceptional heart care, we have created designated Provider Care Teams.  These Care Teams include your primary Cardiologist (physician) and Advanced Practice Providers (APPs -  Physician Assistants and Nurse Practitioners) who all work together to provide you with the care you need, when you need it. ? ?We recommend signing up for the patient portal called "MyChart".  Sign up information is provided on this After Visit Summary.  MyChart is used to connect with patients for Virtual Visits (Telemedicine).  Patients are able to view lab/test results, encounter notes, upcoming appointments, etc.  Non-urgent messages can be sent to your provider as well.   ?To learn more about what you can do with MyChart, go to [email or web address].   ? ?Your next appointment:   ?12 month(s) ? ?The format for your next appointment:   ?In  Person ? ?Provider:   ?Jenkins Rouge, MD { ? ? ? ? ?Your cardiac CT will be scheduled at one of the below locations:  ? ?[facility] ?[address] ?[place], [place] [address] ?[phone] ? ?If scheduled at [facility], please arrive at the [facility] Entrance (Entrance C2) of [facility] 30 minutes prior to test start time. ?You can use the FREE valet parking offered at entrance C (encouraged to control the heart rate for the test)  ?Proceed to the [facility] Radiology Department (first floor) to check-in and test prep. ? ?All radiology patients and guests should use entrance C2 at [facility], accessed from [facility], even though the hospital's physical address listed is [address] ? ? ? ? ?Please follow these instructions carefully (unless otherwise directed): ? ? ?On the Night Before the Test: ?Be sure to Drink plenty of water. ?Do not consume any caffeinated/decaffeinated beverages or chocolate 12 hours prior to your test. ?Do not take any antihistamines 12 hours prior to your test. ? ? ?On the Day of the Test: ?Drink plenty of water until 1 hour prior to the test. ?Do not eat any food 4 hours prior to the test. ?You may take your regular medications prior to the test.  ?Take metoprolol (Lopressor) 50 mg two hours prior to test. ?FEMALES- please wear underwire-free bra if available, avoid dresses & tight clothing ? ?After the Test: ?Drink plenty of water. ?After receiving IV contrast, you may experience a mild flushed feeling. This is normal. ?On occasion, you may experience  a mild rash up to 24 hours after the test. This is not dangerous. If this occurs, you can take Benadryl 25 mg and increase your fluid intake. ?If you experience trouble breathing, this can be serious. If it is severe call 911 IMMEDIATELY. If it is mild, please call our office. ? ? ?We will call to schedule your test 2-4 weeks out understanding that some insurance  companies will need an authorization prior to the service being performed.  ? ?For non-scheduling related questions, please contact the cardiac imaging nurse navigator should you have any questions/concerns: ?Marchia Bond, Cardiac Imaging Nurse Navigator ?Gordy Clement, Cardiac Imaging Nurse Navigator ?[facility] ?Direct Office Dial: [phone]  ? ?For scheduling needs, including cancellations and rescheduling, please call [place], [phone].  ? ?Important Information About Sugar ? ? ? ? ?  ?

## 2021-09-02 DIAGNOSIS — L570 Actinic keratosis: Secondary | ICD-10-CM | POA: Diagnosis not present

## 2021-09-02 DIAGNOSIS — L821 Other seborrheic keratosis: Secondary | ICD-10-CM | POA: Diagnosis not present

## 2021-09-02 DIAGNOSIS — D485 Neoplasm of uncertain behavior of skin: Secondary | ICD-10-CM | POA: Diagnosis not present

## 2021-09-02 DIAGNOSIS — C44719 Basal cell carcinoma of skin of left lower limb, including hip: Secondary | ICD-10-CM | POA: Diagnosis not present

## 2021-09-02 DIAGNOSIS — D692 Other nonthrombocytopenic purpura: Secondary | ICD-10-CM | POA: Diagnosis not present

## 2021-09-02 DIAGNOSIS — D225 Melanocytic nevi of trunk: Secondary | ICD-10-CM | POA: Diagnosis not present

## 2021-09-02 DIAGNOSIS — D1801 Hemangioma of skin and subcutaneous tissue: Secondary | ICD-10-CM | POA: Diagnosis not present

## 2021-09-02 DIAGNOSIS — Z85828 Personal history of other malignant neoplasm of skin: Secondary | ICD-10-CM | POA: Diagnosis not present

## 2021-09-02 LAB — CBC WITH DIFFERENTIAL/PLATELET
Absolute Monocytes: 601 cells/uL (ref 200–950)
Basophils Absolute: 37 cells/uL (ref 0–200)
Basophils Relative: 0.6 %
Eosinophils Absolute: 62 cells/uL (ref 15–500)
Eosinophils Relative: 1 %
HCT: 34.4 % — ABNORMAL LOW (ref 35.0–45.0)
Hemoglobin: 11.6 g/dL — ABNORMAL LOW (ref 11.7–15.5)
Lymphs Abs: 2226 cells/uL (ref 850–3900)
MCH: 32.3 pg (ref 27.0–33.0)
MCHC: 33.7 g/dL (ref 32.0–36.0)
MCV: 95.8 fL (ref 80.0–100.0)
MPV: 9.3 fL (ref 7.5–12.5)
Monocytes Relative: 9.7 %
Neutro Abs: 3274 cells/uL (ref 1500–7800)
Neutrophils Relative %: 52.8 %
Platelets: 156 10*3/uL (ref 140–400)
RBC: 3.59 10*6/uL — ABNORMAL LOW (ref 3.80–5.10)
RDW: 12.2 % (ref 11.0–15.0)
Total Lymphocyte: 35.9 %
WBC: 6.2 10*3/uL (ref 3.8–10.8)

## 2021-09-02 LAB — MAGNESIUM: Magnesium: 1.8 mg/dL (ref 1.5–2.5)

## 2021-09-02 LAB — URINALYSIS, ROUTINE W REFLEX MICROSCOPIC
Bilirubin Urine: NEGATIVE
Glucose, UA: NEGATIVE
Hgb urine dipstick: NEGATIVE
Ketones, ur: NEGATIVE
Leukocytes,Ua: NEGATIVE
Nitrite: NEGATIVE
Protein, ur: NEGATIVE
Specific Gravity, Urine: 1.004 (ref 1.001–1.035)
pH: 6.5 (ref 5.0–8.0)

## 2021-09-02 LAB — COMPLETE METABOLIC PANEL WITH GFR
AG Ratio: 2.9 (calc) — ABNORMAL HIGH (ref 1.0–2.5)
ALT: 18 U/L (ref 6–29)
AST: 24 U/L (ref 10–35)
Albumin: 4.4 g/dL (ref 3.6–5.1)
Alkaline phosphatase (APISO): 103 U/L (ref 37–153)
BUN/Creatinine Ratio: 18 (calc) (ref 6–22)
BUN: 19 mg/dL (ref 7–25)
CO2: 25 mmol/L (ref 20–32)
Calcium: 9.2 mg/dL (ref 8.6–10.4)
Chloride: 102 mmol/L (ref 98–110)
Creat: 1.06 mg/dL — ABNORMAL HIGH (ref 0.60–1.00)
Globulin: 1.5 g/dL (calc) — ABNORMAL LOW (ref 1.9–3.7)
Glucose, Bld: 85 mg/dL (ref 65–99)
Potassium: 4.4 mmol/L (ref 3.5–5.3)
Sodium: 139 mmol/L (ref 135–146)
Total Bilirubin: 0.4 mg/dL (ref 0.2–1.2)
Total Protein: 5.9 g/dL — ABNORMAL LOW (ref 6.1–8.1)
eGFR: 56 mL/min/{1.73_m2} — ABNORMAL LOW (ref >=60)

## 2021-09-02 LAB — LIPID PANEL
Cholesterol: 230 mg/dL — ABNORMAL HIGH (ref <200)
HDL: 124 mg/dL (ref >=50)
LDL Cholesterol (Calc): 89 mg/dL (calc)
Non-HDL Cholesterol (Calc): 106 mg/dL (calc) (ref <130)
Total CHOL/HDL Ratio: 1.9 (calc) (ref <5.0)
Triglycerides: 78 mg/dL (ref <150)

## 2021-09-02 LAB — HEMOGLOBIN A1C
Hgb A1c MFr Bld: 5.1 % of total Hgb (ref <5.7)
Mean Plasma Glucose: 100 mg/dL
eAG (mmol/L): 5.5 mmol/L

## 2021-09-02 LAB — MICROALBUMIN / CREATININE URINE RATIO
Creatinine, Urine: 18 mg/dL — ABNORMAL LOW (ref 20–275)
Microalb, Ur: <0.2 mg/dL

## 2021-09-02 LAB — VITAMIN D 25 HYDROXY (VIT D DEFICIENCY, FRACTURES): Vit D, 25-Hydroxy: 96 ng/mL (ref 30–100)

## 2021-09-02 LAB — TSH: TSH: 0.99 mIU/L (ref 0.40–4.50)

## 2021-09-10 ENCOUNTER — Encounter: Payer: Self-pay | Admitting: Gastroenterology

## 2021-09-10 ENCOUNTER — Ambulatory Visit: Payer: PPO | Admitting: Gastroenterology

## 2021-09-10 ENCOUNTER — Other Ambulatory Visit (INDEPENDENT_AMBULATORY_CARE_PROVIDER_SITE_OTHER): Payer: PPO

## 2021-09-10 VITALS — BP 138/80 | HR 58 | Ht 66.0 in | Wt 120.0 lb

## 2021-09-10 DIAGNOSIS — Z1211 Encounter for screening for malignant neoplasm of colon: Secondary | ICD-10-CM | POA: Diagnosis not present

## 2021-09-10 DIAGNOSIS — D649 Anemia, unspecified: Secondary | ICD-10-CM | POA: Diagnosis not present

## 2021-09-10 DIAGNOSIS — R1013 Epigastric pain: Secondary | ICD-10-CM

## 2021-09-10 LAB — IBC + FERRITIN
Ferritin: 46.1 ng/mL (ref 10.0–291.0)
Iron: 64 ug/dL (ref 42–145)
Saturation Ratios: 18.5 % — ABNORMAL LOW (ref 20.0–50.0)
TIBC: 345.8 ug/dL (ref 250.0–450.0)
Transferrin: 247 mg/dL (ref 212.0–360.0)

## 2021-09-10 LAB — RETICULOCYTES
ABS Retic: 78090 cells/uL (ref 20000–80000)
Retic Ct Pct: 1.9 %

## 2021-09-10 LAB — FOLATE: Folate: 11.7 ng/mL (ref >5.9)

## 2021-09-10 LAB — VITAMIN B12: Vitamin B-12: 453 pg/mL (ref 211–911)

## 2021-09-10 LAB — H. PYLORI ANTIBODY, IGG: H Pylori IgG: NEGATIVE

## 2021-09-10 NOTE — Progress Notes (Signed)
[facility] Consult Note:  History: Kathryn Chavez 09/10/2021  Referring provider: Unk Pinto, MD  Reason for consult/chief complaint: Abdominal Pain (Epigastric pain, possible hernia. Taking digest basic and slippery elm supplements and this has helped.)   Subjective  HPI: This is a pleasant 71 year old woman referred for upper abdominal pain and colon cancer screening. She saw primary care in late March, and review of that office note indicates complaints of severe heartburn and epigastric discomfort with a sensation of fullness.  Labs and barium study were ordered.  (There are lab reports and an encounter for the barium study on 07/30/2021 but no report found).  She also reportedly had a routine colonoscopy with Dr. Watt Climes at [facility] in November 2013 and is due for a repeat exam this year.  Kathryn Chavez tells me that in fact she was not having heartburn but instead in epigastric pain and sensation of fullness and protrusion that she believes is a hernia.  She says it occurred after previous pelvic surgery and has bothered her from time to time.  When it was more severe when seeing primary care, there was a visible and palpable protrusion there that is less noticeable now.  She was prescribed a PPI but stopped taking it because it made her feel strange.  However, she is reportedly much improved after taking a probiotic and slippery elm supplement she got at the health food store on the advice of a friend. Her bowel habits are generally regular, and she denies rectal bleeding. Kathryn Chavez think she has lost about 15 pounds over greater than a years time.  She also did not have the barium study as noted above because when she went to radiology she was told the wrong order was placed and that study would not be helpful for her upper abdominal pain.  So no radiologic testing has been done so far. ROS:  Review of Systems  Constitutional:  Negative for appetite change and  unexpected weight change.  HENT:  Negative for mouth sores and voice change.   Eyes:  Negative for pain and redness.  Respiratory:  Positive for shortness of breath. Negative for cough.   Cardiovascular:  Negative for chest pain and palpitations.  Genitourinary:  Negative for dysuria and hematuria.  Musculoskeletal:  Negative for arthralgias and myalgias.  Skin:  Negative for pallor and rash.  Allergic/Immunologic: Positive for environmental allergies.  Neurological:  Negative for weakness and headaches.  Hematological:  Negative for adenopathy.   She has dyspnea going up stairs but otherwise does not get chest pain or dyspnea.  Past Medical History: Past Medical History:  Diagnosis Date   Allergy    rhinitis   Aortic stenosis, moderate    on echo in 2015.   Cancer [facility])    breast   Hyperlipidemia    Hypertension    Personal history of chemotherapy 2000   Personal history of radiation therapy 2000     Past Surgical History: Past Surgical History:  Procedure Laterality Date   ABDOMINAL HYSTERECTOMY     BREAST LUMPECTOMY Right 2000   malignant   CYSTOCELE REPAIR N/A 05/12/2018     Family History: Family History  Problem Relation Age of Onset   Breast cancer Mother 87   Lung cancer Mother    Other Father        BRAIN TUMOR   Breast cancer Daughter 21   Breast cancer Daughter 62   Colitis Daughter    Arthritis Other    Hyperlipidemia  Other    Lung cancer Other    Prostate cancer Other    Breast cancer Other     Social History: Social History   Socioeconomic History   Marital status: Single    Spouse name: Not on file   Number of children: Not on file   Years of education: Not on file   Highest education level: Not on file  Occupational History   Not on file  Tobacco Use   Smoking status: Never   Smokeless tobacco: Never  Vaping Use   Vaping Use: Never used  Substance and Sexual Activity   Alcohol use: No    Comment: social   Drug use: No    Sexual activity: Yes    Partners: Male    Birth control/protection: Surgical  Other Topics Concern   Not on file  Social History Narrative   Not on file   Social Determinants of Health   Financial Resource Strain: Not on file  Food Insecurity: Not on file  Transportation Needs: Not on file  Physical Activity: Not on file  Stress: Not on file  Social Connections: Not on file    Allergies: Allergies  Allergen Reactions   Influenza Vaccines Nausea And Vomiting and Other (See Comments)    dizziness   Other Nausea And Vomiting and Other (See Comments)    dizziness   Penicillamine Hives   Penicillins     REACTION: Hives   Sulfamethoxazole Hives   Sulfonamide Derivatives     REACTION: Hives    Outpatient Meds: Current Outpatient Medications  Medication Sig Dispense Refill   ALPRAZolam (XANAX) 0.5 MG tablet TAKE 1/2 - 1 TABLET UP TO TWICE DAILY AS NEEDED FOR ANXIETY, SLEEP (LIMIT TO LESS THAN 5 DAYS A WEEK) 30 tablet 0   buPROPion (WELLBUTRIN XL) 300 MG 24 hr tablet TAKE 1 TABLET EVERY MORNING FOR MOOD, ANXIETY, FOCUS AND CONCENTRATION. 90 tablet 3   Cholecalciferol (VITAMIN D) 125 MCG (5000 UT) CAPS Take by mouth.     fluticasone (FLONASE) 50 MCG/ACT nasal spray Place 2 sprays into both nostrils daily. 16 g 0   gabapentin (NEURONTIN) 100 MG capsule TAKE ONE CAPSULE THREE TIMES DAILY. 270 capsule 3   levothyroxine (SYNTHROID) 50 MCG tablet Take  1 tablet  Daily  on an empty stomach with only water for 30 minutes & no Antacid meds, Calcium or Magnesium for 4 hours & avoid Biotin 90 tablet 3   metoprolol tartrate (LOPRESSOR) 50 MG tablet Take one tablet by mouth 2 hours prior to CT. 1 tablet 0   OVER THE COUNTER MEDICATION Slippery elm supplement Digest basic supplement     rosuvastatin (CRESTOR) 5 MG tablet TAKE ONE TABLET ONCE DAILY FOR Cholesterol 90 tablet 1   No current facility-administered medications for this visit.       ___________________________________________________________________ Objective   Exam:  BP 138/80   Pulse (!) 58   Ht '5\' 6"'$  (1.676 m)   Wt 120 lb (54.4 kg)   SpO2 97%   BMI 19.37 kg/m  Wt Readings from Last 3 Encounters:  09/10/21 120 lb (54.4 kg)  09/01/21 126 lb 12.8 oz (57.5 kg)  09/01/21 122 lb (55.3 kg)    General: Thin, otherwise well-appearing.  Normal vocal quality Eyes: sclera anicteric, no redness ENT: oral mucosa moist without lesions, no cervical or supraclavicular lymphadenopathy CV: RRR without murmur, S1/S2, no JVD, no peripheral edema Resp: clear to auscultation bilaterally, normal RR and effort noted GI: soft, mild epigastric tenderness,  with active bowel sounds. No guarding or palpable organomegaly noted.  No visible or palpable hernia or other protrusion supine or standing.  She points to just under the sternal notch as the location of discomfort Skin; warm and dry, no rash or jaundice noted Neuro: awake, alert and oriented x 3. Normal gross motor function and fluent speech  Labs:     Latest Ref Rng & Units 09/01/2021    3:41 PM 07/16/2021    3:58 PM 06/02/2021    4:50 PM  CBC  WBC 3.8 - 10.8 Thousand/uL 6.2   6.1   6.3    Hemoglobin 11.7 - 15.5 g/dL 11.6   12.5   12.0    Hematocrit 35.0 - 45.0 % 34.4   36.7   35.4    Platelets 140 - 400 Thousand/uL 156   174   146    Normal MCV and normal differential     Latest Ref Rng & Units 09/01/2021    3:41 PM 07/16/2021    3:58 PM 06/02/2021    4:50 PM  CMP  Glucose 65 - 99 mg/dL 85   87   71    BUN 7 - 25 mg/dL '19   19   13    '$ Creatinine 0.60 - 1.00 mg/dL 1.06   1.06   0.79    Sodium 135 - 146 mmol/L 139   141   136    Potassium 3.5 - 5.3 mmol/L 4.4   3.9   3.6    Chloride 98 - 110 mmol/L 102   102   98    CO2 20 - 32 mmol/L '25   28   24    '$ Calcium 8.6 - 10.4 mg/dL 9.2   9.5   9.4    Total Protein 6.1 - 8.1 g/dL 5.9   6.1   6.0    Total Bilirubin 0.2 - 1.2 mg/dL 0.4   0.4   0.6    AST 10 - 35 U/L  '24   19   20    '$ ALT 6 - 29 U/L '18   17   14       '$ Radiologic Studies:  1. Left ventricular ejection fraction, by estimation, is 60 to 65%. The  left ventricle has normal function. The left ventricle has no regional  wall motion abnormalities. Left ventricular diastolic parameters are  consistent with Grade I diastolic  dysfunction (impaired relaxation).   2. Right ventricular systolic function is mildly reduced. The right  ventricular size is normal. Tricuspid regurgitation signal is inadequate  for assessing PA pressure.   3. The mitral valve is normal in structure. No evidence of mitral valve  regurgitation.   4. The inferior vena cava is normal in size with greater than 50%  respiratory variability, suggesting right atrial pressure of 3 mmHg.   5. The aortic valve is calcified. There is severe calcifcation of the  aortic valve. Aortic valve regurgitation is mild to moderate. Moderate  aortic valve stenosis. Vmax 3.5 m/s, MG 29 mmHg, AVA 0.9 cm^2, DI 0.32    Assessment: Encounter Diagnoses  Name Primary?   Epigastric pain Yes   Special screening for malignant neoplasms, colon    Normocytic anemia    Moderate to severe AS (valve diameter less than a square centimeters) with DOE climbing stairs.  Her anemia is of unknown cause in relation to the symptoms.  She had a portal message from primary care indicating she should discuss that with [place],  though she has had no further lab testing to characterize this anemia.  The upper abdominal pain is of unclear cause, there is no appreciable hernia on exam, but she says in the past there has been a more prominent abnormality there.  She is lately better on some natural remedies for unclear reasons.  She does not recall previous H. pylori testing or treatment nor upper endoscopy.  She does not take aspirin or NSAIDs, but peptic ulcer still a consideration.  While she was initially reported as having heartburn, she denies that at present and  has not felt the need to take a PPI.  It also caused her some side effects Plan: Iron studies, X93, folic acid and reticulocyte count.  Results will be conveyed to patient but also primary care for further evaluation.  Upper endoscopy to evaluate the abdominal pain and also a screening colonoscopy.  She was agreeable after discussion of procedure and risks.  The benefits and risks of the planned procedure were described in detail with the patient or (when appropriate) their health care proxy.  Risks were outlined as including, but not limited to, bleeding, infection, perforation, adverse medication reaction leading to cardiac or pulmonary decompensation, pancreatitis (if ERCP).  The limitation of incomplete mucosal visualization was also discussed.  No guarantees or warranties were given.  Patient at increased risk for cardiopulmonary complications of procedure due to medical comorbidities.  (Aortic stenosis)  Given the degree of AS, her procedures must be done in the hospital outpatient endoscopy lab, for which I currently have about a 50-monthwait period due to current schedule and staffing stresses.  We will contact her with that appointment when further scheduling opens. If she develops a recurrence of this pain before then, she will contact me in the neck step would be an ultrasound to rule out gallstones.   Thank you for the courtesy of this consult.  Please call me with any questions or concerns.  HNelida MeuseIII  CC: Referring provider noted above

## 2021-09-10 NOTE — Patient Instructions (Signed)
If you are age 72 or older, your body mass index should be between 23-30. Your Body mass index is 19.37 kg/m. If this is out of the aforementioned range listed, please consider follow up with your Primary Care Provider.  If you are age 76 or younger, your body mass index should be between 19-25. Your Body mass index is 19.37 kg/m. If this is out of the aformentioned range listed, please consider follow up with your Primary Care Provider.   ________________________________________________________  The [facility] providers would like to encourage you to use [facility] to communicate with providers for non-urgent requests or questions.  Due to long hold times on the telephone, sending your provider a message by [facility] may be a faster and more efficient way to get a response.  Please allow 48 business hours for a response.  Please remember that this is for non-urgent requests.  _______________________________________________________  Your provider has requested that you go to the basement level for lab work before leaving today. Press "B" on the elevator. The lab is located at the first door on the left as you exit the elevator.  Due to recent changes in healthcare laws, you may see the results of your imaging and laboratory studies on MyChart before your provider has had a chance to review them.  We understand that in some cases there may be results that are confusing or concerning to you. Not all laboratory results come back in the same time frame and the provider may be waiting for multiple results in order to interpret others.  Please give [place] 48 hours in order for your provider to thoroughly review all the results before contacting the office for clarification of your results.   It was a pleasure to see you today!  Thank you for trusting me with your gastrointestinal care!

## 2021-09-12 ENCOUNTER — Telehealth: Payer: Self-pay | Admitting: Gastroenterology

## 2021-09-12 NOTE — Telephone Encounter (Signed)
Left message on machine to call back  

## 2021-09-12 NOTE — Telephone Encounter (Signed)
Patient called to discuss lab results from 09/10/21. Please advise.

## 2021-09-16 ENCOUNTER — Other Ambulatory Visit: Payer: Self-pay | Admitting: Nurse Practitioner

## 2021-09-16 DIAGNOSIS — D508 Other iron deficiency anemias: Secondary | ICD-10-CM

## 2021-09-16 NOTE — Progress Notes (Signed)
Thank you very much 

## 2021-09-16 NOTE — Telephone Encounter (Signed)
Called and spoke with patient. We reviewed MyChart message sent to her by Dr. Loletha Carrow. Pt stated that she was able to review the MyChart message over the weekend. Pt is aware that we will contact her once we have the August hospital schedule available. Pt verbalized understanding and had no concerns at the end of the call.

## 2021-09-18 ENCOUNTER — Telehealth: Payer: Self-pay

## 2021-09-18 NOTE — Telephone Encounter (Signed)
Lm on vm for patient asking that she return my call to discuss being scheduled at [facility] on Monday, 09/22/21.

## 2021-09-19 NOTE — Telephone Encounter (Signed)
Lm on vm for patient to return call to discuss being scheduled at [facility] on Monday, 09/22/21.

## 2021-09-19 NOTE — Telephone Encounter (Signed)
No return call received. 6/5 appt at [facility] has been taken.

## 2021-09-22 ENCOUNTER — Ambulatory Visit (HOSPITAL_COMMUNITY): Payer: PPO

## 2021-09-26 ENCOUNTER — Telehealth (HOSPITAL_COMMUNITY): Payer: Self-pay | Admitting: *Deleted

## 2021-09-26 NOTE — Telephone Encounter (Signed)
Patient returning call regarding upcoming cardiac study; pt verbalizes understanding of appt date/time, parking situation and where to check in, pre-test NPO status and medications ordered; name and call back number provided for further questions should they arise  Gordy Clement RN Navigator Cardiac Imaging [facility] [phone] office [phone] cell  Patient to take 25-'50mg'$  metoprolol tartrate two hours prior to cardiac CT scan if HR greater than 65bpm.  She is aware to arrive at 11am.

## 2021-09-26 NOTE — Telephone Encounter (Signed)
Attempted to call patient regarding upcoming cardiac CT appointment. °Left message on voicemail with name and callback number ° °Jonay Hitchcock RN Navigator Cardiac Imaging °[facility] °[phone] Office °[phone] Cell ° °

## 2021-09-29 ENCOUNTER — Ambulatory Visit (HOSPITAL_COMMUNITY)
Admission: RE | Admit: 2021-09-29 | Discharge: 2021-09-29 | Disposition: A | Discharge status: Home / Self Care | Payer: PPO | Source: Ambulatory Visit | Attending: Cardiovascular Disease | Admitting: Cardiovascular Disease

## 2021-09-29 ENCOUNTER — Encounter (HOSPITAL_COMMUNITY): Payer: Self-pay

## 2021-09-29 DIAGNOSIS — I350 Nonrheumatic aortic (valve) stenosis: Secondary | ICD-10-CM

## 2021-09-29 DIAGNOSIS — R072 Precordial pain: Secondary | ICD-10-CM

## 2021-09-29 DIAGNOSIS — I10 Essential (primary) hypertension: Secondary | ICD-10-CM

## 2021-09-29 MED ORDER — NITROGLYCERIN 0.4 MG SL SUBL: 0.8000 mg | SUBLINGUAL_TABLET | Freq: Once | SUBLINGUAL | Status: DC

## 2021-09-29 NOTE — Progress Notes (Signed)
Sent message to Dr. Harriet Masson pt having frequent PAC's.  Dr. Harriet Masson returned message asked that pt not be scanned.  She will discuss with Nishan.

## 2021-10-03 ENCOUNTER — Telehealth: Payer: Self-pay

## 2021-10-03 DIAGNOSIS — R072 Precordial pain: Secondary | ICD-10-CM

## 2021-10-03 DIAGNOSIS — I10 Essential (primary) hypertension: Secondary | ICD-10-CM

## 2021-10-03 DIAGNOSIS — R9431 Abnormal electrocardiogram [ECG] [EKG]: Secondary | ICD-10-CM

## 2021-10-03 DIAGNOSIS — I351 Nonrheumatic aortic (valve) insufficiency: Secondary | ICD-10-CM

## 2021-10-03 NOTE — Telephone Encounter (Signed)
-----   Message from Josue Hector, MD sent at 10/03/2021  3:25 PM EDT ----- Not sure what issue was ECG was fine and she's never had any ectopy for [place] Can order PET CT to r/o CAD  ----- Message ----- From: Lorenza Evangelist, RN Sent: 10/03/2021   3:04 PM EDT To: Josue Hector, MD; [place] do we need to re-attempt her CCTA ? Should we do something different as far as pre-meds for HR control next time around? Clarise Cruz

## 2021-10-03 NOTE — Telephone Encounter (Signed)
Left message for patient to call back. Will inform patient of order change and instructions when she calls back.

## 2021-10-04 ENCOUNTER — Other Ambulatory Visit: Payer: Self-pay | Admitting: Internal Medicine

## 2021-10-04 DIAGNOSIS — E039 Hypothyroidism, unspecified: Secondary | ICD-10-CM

## 2021-10-06 ENCOUNTER — Encounter: Payer: Self-pay | Admitting: Cardiovascular Disease

## 2021-10-14 ENCOUNTER — Other Ambulatory Visit: Payer: Self-pay | Admitting: Nurse Practitioner

## 2021-10-30 ENCOUNTER — Telehealth: Payer: Self-pay

## 2021-10-30 DIAGNOSIS — Z1211 Encounter for screening for malignant neoplasm of colon: Secondary | ICD-10-CM

## 2021-10-30 DIAGNOSIS — R1013 Epigastric pain: Secondary | ICD-10-CM

## 2021-10-30 NOTE — Telephone Encounter (Signed)
Dr. Loletha Carrow has availability on 11/20/21 at [facility]. I called and left pt a detailed vm asking if she would be available on 11/20/21 for her EGD/colon appt. I asked that patient give me a call back ASAP or send a MyChart message with her response.

## 2021-10-31 ENCOUNTER — Other Ambulatory Visit: Payer: Self-pay

## 2021-10-31 DIAGNOSIS — R1013 Epigastric pain: Secondary | ICD-10-CM

## 2021-10-31 DIAGNOSIS — Z1211 Encounter for screening for malignant neoplasm of colon: Secondary | ICD-10-CM

## 2021-10-31 MED ORDER — NA SULFATE-K SULFATE-MG SULF 17.5-3.13-1.6 GM/177ML PO SOLN: 1.0000 | ORAL | 0 refills | Status: DC

## 2021-10-31 NOTE — Telephone Encounter (Signed)
Returned call to patient twice. I left her another vm to give me a call back.

## 2021-10-31 NOTE — Telephone Encounter (Signed)
Patient returned your phone call.  Please call her at [phone].  Thank you.

## 2021-10-31 NOTE — Telephone Encounter (Signed)
Pt returned call. She states that she can make 11/20/21 appt at [facility] for EGD/colon. Pt has been advised that she will need to arrive at [facility] by 8 am with a care partner. Pt denies being a diabetic and denies any current anticoagulation therapy. Pt reports that she has access to her MyChart. Pt is aware that I will send her instructions to her MyChart and I will place a copy in the mail for her as well. Pt would like prep sent to [facility] on file. Pt verbalized understanding and had no concerns at the end of the call.   Ambulatory referral to GI in epic. EGD/colon instructions sent to patient via MyChart and a copy has been mailed. SUPREP sent to preferred pharmacy.

## 2021-10-31 NOTE — Telephone Encounter (Signed)
2nd attempt to reach patient. Left message on vm for patient to return call to discuss EGD/colon appt at [facility] on 11/20/21 with Dr. Loletha Carrow

## 2021-11-10 ENCOUNTER — Other Ambulatory Visit: Payer: Self-pay | Admitting: Nurse Practitioner

## 2021-11-10 DIAGNOSIS — Z85828 Personal history of other malignant neoplasm of skin: Secondary | ICD-10-CM | POA: Diagnosis not present

## 2021-11-10 DIAGNOSIS — D0471 Carcinoma in situ of skin of right lower limb, including hip: Secondary | ICD-10-CM | POA: Diagnosis not present

## 2021-11-10 DIAGNOSIS — L570 Actinic keratosis: Secondary | ICD-10-CM | POA: Diagnosis not present

## 2021-11-10 DIAGNOSIS — D485 Neoplasm of uncertain behavior of skin: Secondary | ICD-10-CM | POA: Diagnosis not present

## 2021-11-12 DIAGNOSIS — H50011 Monocular esotropia, right eye: Secondary | ICD-10-CM | POA: Diagnosis not present

## 2021-11-13 ENCOUNTER — Encounter (HOSPITAL_COMMUNITY): Payer: Self-pay | Admitting: Gastroenterology

## 2021-11-20 ENCOUNTER — Ambulatory Visit (HOSPITAL_COMMUNITY): Payer: PPO | Admitting: Certified Registered"

## 2021-11-20 ENCOUNTER — Ambulatory Visit (HOSPITAL_COMMUNITY)
Admission: RE | Admit: 2021-11-20 | Discharge: 2021-11-20 | Disposition: A | Discharge status: Home / Self Care | Payer: PPO | Attending: Gastroenterology | Admitting: Gastroenterology

## 2021-11-20 ENCOUNTER — Ambulatory Visit (HOSPITAL_BASED_OUTPATIENT_CLINIC_OR_DEPARTMENT_OTHER): Payer: PPO | Admitting: Certified Registered"

## 2021-11-20 ENCOUNTER — Encounter (HOSPITAL_COMMUNITY)
Admission: RE | Disposition: A | Discharge status: Home / Self Care | Payer: Self-pay | Source: Home / Self Care | Attending: Gastroenterology

## 2021-11-20 ENCOUNTER — Other Ambulatory Visit: Payer: Self-pay

## 2021-11-20 ENCOUNTER — Encounter (HOSPITAL_COMMUNITY): Payer: Self-pay | Admitting: Gastroenterology

## 2021-11-20 DIAGNOSIS — K5730 Diverticulosis of large intestine without perforation or abscess without bleeding: Secondary | ICD-10-CM | POA: Diagnosis not present

## 2021-11-20 DIAGNOSIS — R1013 Epigastric pain: Secondary | ICD-10-CM | POA: Insufficient documentation

## 2021-11-20 DIAGNOSIS — K2289 Other specified disease of esophagus: Secondary | ICD-10-CM

## 2021-11-20 DIAGNOSIS — K315 Obstruction of duodenum: Secondary | ICD-10-CM

## 2021-11-20 DIAGNOSIS — K648 Other hemorrhoids: Secondary | ICD-10-CM | POA: Insufficient documentation

## 2021-11-20 DIAGNOSIS — Z1211 Encounter for screening for malignant neoplasm of colon: Secondary | ICD-10-CM | POA: Diagnosis not present

## 2021-11-20 DIAGNOSIS — Z1212 Encounter for screening for malignant neoplasm of rectum: Secondary | ICD-10-CM | POA: Diagnosis not present

## 2021-11-20 DIAGNOSIS — E039 Hypothyroidism, unspecified: Secondary | ICD-10-CM | POA: Diagnosis not present

## 2021-11-20 DIAGNOSIS — K635 Polyp of colon: Secondary | ICD-10-CM

## 2021-11-20 DIAGNOSIS — D120 Benign neoplasm of cecum: Secondary | ICD-10-CM | POA: Diagnosis not present

## 2021-11-20 DIAGNOSIS — K2980 Duodenitis without bleeding: Secondary | ICD-10-CM | POA: Diagnosis not present

## 2021-11-20 DIAGNOSIS — K209 Esophagitis, unspecified without bleeding: Secondary | ICD-10-CM | POA: Insufficient documentation

## 2021-11-20 HISTORY — PX: POLYPECTOMY: SHX5525

## 2021-11-20 HISTORY — PX: COLONOSCOPY WITH PROPOFOL: SHX5780

## 2021-11-20 HISTORY — PX: BIOPSY: SHX5522

## 2021-11-20 HISTORY — PX: ESOPHAGOGASTRODUODENOSCOPY (EGD) WITH PROPOFOL: SHX5813

## 2021-11-20 SURGERY — ESOPHAGOGASTRODUODENOSCOPY (EGD) WITH PROPOFOL
Anesthesia: Monitor Anesthesia Care

## 2021-11-20 MED ORDER — PROPOFOL 500 MG/50ML IV EMUL
INTRAVENOUS | Status: DC | PRN
  Administered 2021-11-20: 50 mg via INTRAVENOUS
  Administered 2021-11-20: 100 mg via INTRAVENOUS
  Administered 2021-11-20: 75 ug/kg/min via INTRAVENOUS

## 2021-11-20 MED ORDER — LACTATED RINGERS IV SOLN: INTRAVENOUS | Status: DC

## 2021-11-20 MED ORDER — SODIUM CHLORIDE 0.9 % IV SOLN: INTRAVENOUS | Status: DC

## 2021-11-20 MED ORDER — PROPOFOL 1000 MG/100ML IV EMUL
INTRAVENOUS | Status: AC
  Filled 2021-11-20: qty 100

## 2021-11-20 SURGICAL SUPPLY — 25 items

## 2021-11-20 NOTE — Op Note (Signed)
[facility] Patient Name: Kathryn Chavez Procedure Date: 11/20/2021 MRN: [id number] Attending MD: Estill Cotta. Loletha Carrow , MD Date of Birth: 12-09-1950 CSN: [id number] Age: 71 Admit Type: Outpatient Procedure:                Upper GI endoscopy Indications:              Epigastric abdominal pain (see office consult note                            for clinical details. Pain improved last few months                            on dietary supplements. H. pylori serology negative) Providers:                Mallie Mussel L. Loletha Carrow, MD, Carlyn Reichert, RN, William Dalton, Technician Referring MD:              Medicines:                Monitored Anesthesia Care Complications:            No immediate complications. Estimated Blood Loss:     Estimated blood loss was minimal. Procedure:                Pre-Anesthesia Assessment:                           - Prior to the procedure, a History and Physical                            was performed, and patient medications and                            allergies were reviewed. The patient's tolerance of                            previous anesthesia was also reviewed. The risks                            and benefits of the procedure and the sedation                            options and risks were discussed with the patient.                            All questions were answered, and informed consent                            was obtained. Prior Anticoagulants: The patient has                            taken no previous anticoagulant or antiplatelet  agents. ASA Grade Assessment: III - A patient with                            severe systemic disease. After reviewing the risks                            and benefits, the patient was deemed in                            satisfactory condition to undergo the procedure.                           After obtaining informed consent, the endoscope was                             passed under direct vision. Throughout the                            procedure, the patient's blood pressure, pulse, and                            oxygen saturations were monitored continuously. The                            [id number]-[id number] ([id number]) Olympus endoscope was introduced                            through the mouth, and advanced to the second part                            of duodenum. The upper GI endoscopy was                            accomplished without difficulty. The patient                            tolerated the procedure well. Scope In: Scope Out: Findings:      Three tongues of salmon-colored mucosa were present. No other visible       abnormalities were present (WL, NBI and mag). The maximum longitudinal       extent of these esophageal mucosal changes was 2 cm in length. Biopsies       were taken with a cold forceps for histology.      The exam of the esophagus was otherwise normal.      The stomach was normal.      The cardia and gastric fundus were normal on retroflexion.      An acquired benign-appearing, intrinsic moderate stenosis was found in       the first portion of the duodenum (proximal sweep) and was traversed       with slow steady scope pressure and moderate resistance.      The exam of the duodenum was otherwise normal. Impression:               - Salmon-colored mucosa suspicious for  short-segment Barrett's esophagus. Biopsied.                           - Normal stomach.                           - Acquired duodenal stenosis. Probable remnant of                            prior ulcer. No current aspirin or NSAID use                            reported.                           No cause for upper abdominal pain seen on this                            exam. If pain returns, schedule CT scan abdomen. Moderate Sedation:      MAC sedation used Recommendation:           - Patient has a contact number  available for                            emergencies. The signs and symptoms of potential                            delayed complications were discussed with the                            patient. Return to normal activities tomorrow.                            Written discharge instructions were provided to the                            patient.                           - Resume previous diet.                           - Continue present medications.                           - Await pathology results.                           - See the other procedure note for documentation of                            additional recommendations. Procedure Code(s):        --- Professional ---                           [phone], Esophagogastroduodenoscopy, flexible,  transoral; with biopsy, single or multiple Diagnosis Code(s):        --- Professional ---                           K22.8, Other specified diseases of esophagus                           K31.5, Obstruction of duodenum                           R10.13, Epigastric pain CPT copyright 2019 American Medical Association. All rights reserved. The codes documented in this report are preliminary and upon coder review may  be revised to meet current compliance requirements. Jonnelle Lawniczak L. Loletha Carrow, MD 11/20/2021 9:22:09 AM This report has been signed electronically. Number of Addenda: 0

## 2021-11-20 NOTE — Anesthesia Preprocedure Evaluation (Addendum)
Anesthesia Evaluation    Airway Mallampati: II  TM Distance: >3 FB Neck ROM: Full    Dental no notable dental hx.    Pulmonary neg pulmonary ROS,    Pulmonary exam normal        Cardiovascular hypertension, + Valvular Problems/Murmurs AS  Rhythm:Regular Rate:Normal + Systolic murmurs ECHO 25/63: 1. Left ventricular ejection fraction, by estimation, is 60 to 65%. The  left ventricle has normal function. The left ventricle has no regional  wall motion abnormalities. Left ventricular diastolic parameters are  consistent with Grade I diastolic  dysfunction (impaired relaxation).  2. Right ventricular systolic function is mildly reduced. The right  ventricular size is normal. Tricuspid regurgitation signal is inadequate  for assessing PA pressure.  3. The mitral valve is normal in structure. No evidence of mitral valve  regurgitation.  4. The inferior vena cava is normal in size with greater than 50%  respiratory variability, suggesting right atrial pressure of 3 mmHg.  5. The aortic valve is calcified. There is severe calcifcation of the  aortic valve. Aortic valve regurgitation is mild to moderate. Moderate  aortic valve stenosis. Vmax 3.5 m/s, MG 29 mmHg, AVA 0.9 cm^2, DI 0.32    Neuro/Psych negative neurological ROS  negative psych ROS   GI/Hepatic Neg liver ROS, CRC screening, epigastric pain   Endo/Other  Hypothyroidism   Renal/GU negative Renal ROS  negative genitourinary   Musculoskeletal negative musculoskeletal ROS (+)   Abdominal Normal abdominal exam  (+)   Peds  Hematology negative hematology ROS (+)   Anesthesia Other Findings   Reproductive/Obstetrics                             Anesthesia Physical Anesthesia Plan  ASA: 3  Anesthesia Plan: MAC   Post-op Pain Management:    Induction: Intravenous  PONV Risk Score and Plan: 2 and Propofol infusion and Treatment may  vary due to age or medical condition  Airway Management Planned: Simple Face Mask, Natural Airway and Nasal Cannula  Additional Equipment: None  Intra-op Plan:   Post-operative Plan:   Informed Consent: I have reviewed the patients History and Physical, chart, labs and discussed the procedure including the risks, benefits and alternatives for the proposed anesthesia with the patient or authorized representative who has indicated his/her understanding and acceptance.     Dental advisory given  Plan Discussed with: CRNA  Anesthesia Plan Comments: (Lab Results      Component                Value               Date                      WBC                      6.2                 09/01/2021                HGB                      11.6 (L)            09/01/2021                HCT  34.4 (L)            09/01/2021                MCV                      95.8                09/01/2021                PLT                      156                 09/01/2021           Lab Results      Component                Value               Date                      NA                       139                 09/01/2021                K                        4.4                 09/01/2021                CO2                      25                  09/01/2021                GLUCOSE                  85                  09/01/2021                BUN                      19                  09/01/2021                CREATININE               1.06 (H)            09/01/2021                CALCIUM                  9.2                 09/01/2021                EGFR                     56 (L)  09/01/2021                GFRNONAA                 75                  07/22/2020          )        Anesthesia Quick Evaluation

## 2021-11-20 NOTE — Discharge Instructions (Signed)
YOU HAD AN ENDOSCOPIC PROCEDURE TODAY: Refer to the procedure report and other information in the discharge instructions given to you for any specific questions about what was found during the examination. If this information does not answer your questions, please call [place] office at [phone] to clarify.  ° °YOU SHOULD EXPECT: Some feelings of bloating in the abdomen. Passage of more gas than usual. Walking can help get rid of the air that was put into your GI tract during the procedure and reduce the bloating. If you had a lower endoscopy (such as a colonoscopy or flexible sigmoidoscopy) you may notice spotting of blood in your stool or on the toilet paper. Some abdominal soreness may be present for a day or two, also. ° °DIET: Your first meal following the procedure should be a light meal and then it is ok to progress to your normal diet. A half-sandwich or bowl of soup is an example of a good first meal. Heavy or fried foods are harder to digest and may make you feel nauseous or bloated. Drink plenty of fluids but you should avoid alcoholic beverages for 24 hours. If you had a esophageal dilation, please see attached instructions for diet.   ° °ACTIVITY: Your care partner should take you home directly after the procedure. You should plan to take it easy, moving slowly for the rest of the day. You can resume normal activity the day after the procedure however YOU SHOULD NOT DRIVE, use power tools, machinery or perform tasks that involve climbing or major physical exertion for 24 hours (because of the sedation medicines used during the test).  ° °SYMPTOMS TO REPORT IMMEDIATELY: °A gastroenterologist can be reached at any hour. Please call [phone]  for any of the following symptoms:  °Following lower endoscopy (colonoscopy, flexible sigmoidoscopy) °Excessive amounts of blood in the stool  °Significant tenderness, worsening of abdominal pains  °Swelling of the abdomen that is new, acute  °Fever of 100° or  higher  °Following upper endoscopy (EGD, EUS, ERCP, esophageal dilation) °Vomiting of blood or coffee ground material  °New, significant abdominal pain  °New, significant chest pain or pain under the shoulder blades  °Painful or persistently difficult swallowing  °New shortness of breath  °Black, tarry-looking or red, bloody stools ° °FOLLOW UP:  °If any biopsies were taken you will be contacted by phone or by letter within the next 1-3 weeks. Call [phone]  if you have not heard about the biopsies in 3 weeks.  °Please also call with any specific questions about appointments or follow up tests. ° °

## 2021-11-20 NOTE — Anesthesia Postprocedure Evaluation (Signed)
Anesthesia Post Note  Patient: Kathryn Chavez  Procedure(s) Performed: ESOPHAGOGASTRODUODENOSCOPY (EGD) WITH PROPOFOL COLONOSCOPY WITH PROPOFOL BIOPSY POLYPECTOMY     Patient location during evaluation: Endoscopy Anesthesia Type: MAC Level of consciousness: awake and alert Pain management: pain level controlled Vital Signs Assessment: post-procedure vital signs reviewed and stable Respiratory status: spontaneous breathing, nonlabored ventilation, respiratory function stable and patient connected to nasal cannula oxygen Cardiovascular status: stable and blood pressure returned to baseline Postop Assessment: no apparent nausea or vomiting Anesthetic complications: no   No notable events documented.  Last Vitals:  Vitals:   11/20/21 0930 11/20/21 0945  BP: 139/72 133/72  Pulse: 84 89  Resp: 15 20  Temp:    SpO2: 99% 100%    Last Pain:  Vitals:   11/20/21 0945  TempSrc:   PainSc: 0-No pain                 Belenda Cruise P Jamani Eley

## 2021-11-20 NOTE — H&P (Signed)
History and Physical:  This patient presents for endoscopic testing for: Epigastric pain Colon cancer screening  Clinical details in my office consult note of 09/10/2021. She reports today that she is feeling well since that last office visit, and believes that her upper abdominal pain has been related to her hernia.  She has been taking various dietary supplements that seem to have helped.  Patient is otherwise without complaints or active issues today.   Past Medical History: Past Medical History:  Diagnosis Date   Allergy    rhinitis   Aortic stenosis, moderate    on echo in 2015.   Cancer [facility])    breast   Hyperlipidemia    Hypertension    Personal history of chemotherapy 2000   Personal history of radiation therapy 2000     Past Surgical History: Past Surgical History:  Procedure Laterality Date   ABDOMINAL HYSTERECTOMY     BREAST LUMPECTOMY Right 2000   malignant   CYSTOCELE REPAIR N/A 05/12/2018    Allergies: Allergies  Allergen Reactions   Influenza Vaccines Nausea And Vomiting and Other (See Comments)    dizziness   Other Nausea And Vomiting and Other (See Comments)    dizziness   Penicillamine Hives   Penicillins     REACTION: Hives   Sulfamethoxazole Hives   Sulfonamide Derivatives     REACTION: Hives    Outpatient Meds: Current Facility-Administered Medications  Medication Dose Route Frequency Provider Last Rate Last Admin   0.9 %  sodium chloride infusion   Intravenous Continuous Danis, Estill Cotta III, MD       lactated ringers infusion   Intravenous Continuous Nelida Meuse III, MD 10 mL/hr at 11/20/21 0809 New Bag at 11/20/21 0809      ___________________________________________________________________ Objective   Exam:  BP 118/75   Pulse 93   Temp 97.6 F (36.4 C) (Oral)   Resp 13   Ht '5\' 6"'$  (1.676 m)   Wt 56.7 kg   SpO2 99%   BMI 20.18 kg/m   CV: RRR with a 4 out of 6 systolic murmur Resp: clear to auscultation bilaterally,  normal RR and effort noted GI: soft, no tenderness, with active bowel sounds.   Assessment: Epigastric pain Colon cancer screening, average risk   Plan: Colonoscopy EGD  The benefits and risks of the planned procedure were described in detail with the patient or (when appropriate) their health care proxy.  Risks were outlined as including, but not limited to, bleeding, infection, perforation, adverse medication reaction leading to cardiac or pulmonary decompensation, pancreatitis (if ERCP).  The limitation of incomplete mucosal visualization was also discussed.  No guarantees or warranties were given.    The patient is appropriate for an endoscopic procedure in the ambulatory setting.   - Wilfrid Lund, MD

## 2021-11-20 NOTE — Interval H&P Note (Signed)
History and Physical Interval Note:  11/20/2021 8:27 AM  Kathryn Chavez  has presented today for surgery, with the diagnosis of epigastric pain, CRC screening.  The various methods of treatment have been discussed with the patient and family. After consideration of risks, benefits and other options for treatment, the patient has consented to  Procedure(s): ESOPHAGOGASTRODUODENOSCOPY (EGD) WITH PROPOFOL (N/A) COLONOSCOPY WITH PROPOFOL (N/A) as a surgical intervention.  The patient's history has been reviewed, patient examined, no change in status, stable for surgery.  I have reviewed the patient's chart and labs.  Questions were answered to the patient's satisfaction.     Nelida Meuse III

## 2021-11-20 NOTE — Op Note (Signed)
[facility] Patient Name: Kathryn Chavez Procedure Date: 11/20/2021 MRN: [id number] Attending MD: Estill Cotta. Loletha Carrow , MD Date of Birth: 1951-01-04 CSN: [id number] Age: 71 Admit Type: Outpatient Procedure:                Colonoscopy Indications:              Screening for colorectal malignant neoplasm                           No polyps on 2013 colonoscopy Providers:                Mallie Mussel L. Loletha Carrow, MD, Carlyn Reichert, RN, William Dalton, Technician Referring MD:              Medicines:                Monitored Anesthesia Care Complications:            No immediate complications. Estimated Blood Loss:     Estimated blood loss was minimal. Procedure:                Pre-Anesthesia Assessment:                           - Prior to the procedure, a History and Physical                            was performed, and patient medications and                            allergies were reviewed. The patient's tolerance of                            previous anesthesia was also reviewed. The risks                            and benefits of the procedure and the sedation                            options and risks were discussed with the patient.                            All questions were answered, and informed consent                            was obtained. Prior Anticoagulants: The patient has                            taken no previous anticoagulant or antiplatelet                            agents. ASA Grade Assessment: III - A patient with  severe systemic disease. After reviewing the risks                            and benefits, the patient was deemed in                            satisfactory condition to undergo the procedure.                           After obtaining informed consent, the colonoscope                            was passed under direct vision. Throughout the                            procedure, the patient's  blood pressure, pulse, and                            oxygen saturations were monitored continuously. The                            [id number]-HQ190L ([id number]) Olympus colonoscope was                            introduced through the anus and advanced to the the                            cecum, identified by appendiceal orifice and                            ileocecal valve. The colonoscopy was somewhat                            difficult due to multiple diverticula in the colon                            and a tortuous colon. Successful completion of the                            procedure was aided by using manual pressure,                            straightening and shortening the scope to obtain                            bowel loop reduction and water inflation. The                            patient tolerated the procedure well. The quality                            of the bowel preparation was excellent. The  ileocecal valve, appendiceal orifice, and rectum                            were photographed. Scope In: 8:51:18 AM Scope Out: 9:08:31 AM Scope Withdrawal Time: 0 hours 11 minutes 23 seconds  Total Procedure Duration: 0 hours 17 minutes 13 seconds  Findings:      The perianal and digital rectal examinations were normal.      Repeat examination of right colon under NBI performed.      A diminutive polyp was found in the cecum. The polyp was semi-sessile.       The polyp was removed with a cold snare. Resection and retrieval were       complete.      Multiple diverticula were found in the left colon.      Internal hemorrhoids were found.      The exam was otherwise without abnormality on direct and retroflexion       views. Impression:               - One diminutive polyp in the cecum, removed with a                            cold snare. Resected and retrieved.                           - Diverticulosis in the left colon.                           -  Internal hemorrhoids.                           - The examination was otherwise normal on direct                            and retroflexion views. Moderate Sedation:      MAC sedation used Recommendation:           - Patient has a contact number available for                            emergencies. The signs and symptoms of potential                            delayed complications were discussed with the                            patient. Return to normal activities tomorrow.                            Written discharge instructions were provided to the                            patient.                           - Resume previous diet.                           -  Continue present medications.                           - Await pathology results.                           - See the other procedure note for documentation of                            additional recommendations.                           - No repeat surveillance colonoscopy recommended                            due to age, low risk findings and current                            guidelines. Procedure Code(s):        --- Professional ---                           [phone], Colonoscopy, flexible; with removal of                            tumor(s), polyp(s), or other lesion(s) by snare                            technique Diagnosis Code(s):        --- Professional ---                           Z12.11, Encounter for screening for malignant                            neoplasm of colon                           K63.5, Polyp of colon                           K64.8, Other hemorrhoids                           K57.30, Diverticulosis of large intestine without                            perforation or abscess without bleeding CPT copyright 2019 [employer]. All rights reserved. The codes documented in this report are preliminary and upon coder review may  be revised to meet current compliance  requirements. Tyreik Delahoussaye L. Loletha Carrow, MD 11/20/2021 9:26:48 AM This report has been signed electronically. Number of Addenda: 0

## 2021-11-20 NOTE — Transfer of Care (Signed)
Immediate Anesthesia Transfer of Care Note  Patient: Kathryn Chavez  Procedure(s) Performed: ESOPHAGOGASTRODUODENOSCOPY (EGD) WITH PROPOFOL COLONOSCOPY WITH PROPOFOL BIOPSY POLYPECTOMY  Patient Location: PACU  Anesthesia Type:MAC  Level of Consciousness: awake  Airway & Oxygen Therapy: Patient Spontanous Breathing  Post-op Assessment: Report given to RN  Post vital signs: stable  Last Vitals:  Vitals Value Taken Time  BP 123/53 11/20/21 0916  Temp 36.2 C 11/20/21 0915  Pulse 45 11/20/21 0917  Resp 17 11/20/21 0918  SpO2 100 % 11/20/21 0917  Vitals shown include unvalidated device data.  Last Pain:  Vitals:   11/20/21 0915  TempSrc: Temporal  PainSc: 0-No pain         Complications: No notable events documented.

## 2021-11-21 ENCOUNTER — Encounter: Payer: Self-pay | Admitting: Gastroenterology

## 2021-11-21 ENCOUNTER — Encounter (HOSPITAL_COMMUNITY): Payer: Self-pay | Admitting: Gastroenterology

## 2021-11-21 LAB — SURGICAL PATHOLOGY

## 2021-12-03 ENCOUNTER — Encounter: Payer: Self-pay | Admitting: Cardiovascular Disease

## 2021-12-03 NOTE — Telephone Encounter (Signed)
Left message for patient to call back  

## 2021-12-10 DIAGNOSIS — M79645 Pain in left finger(s): Secondary | ICD-10-CM | POA: Diagnosis not present

## 2021-12-10 DIAGNOSIS — M1812 Unilateral primary osteoarthritis of first carpometacarpal joint, left hand: Secondary | ICD-10-CM | POA: Diagnosis not present

## 2021-12-11 ENCOUNTER — Other Ambulatory Visit: Payer: Self-pay | Admitting: Nurse Practitioner

## 2021-12-11 DIAGNOSIS — F418 Other specified anxiety disorders: Secondary | ICD-10-CM

## 2021-12-15 ENCOUNTER — Telehealth (HOSPITAL_COMMUNITY): Payer: Self-pay | Admitting: *Deleted

## 2021-12-15 NOTE — Telephone Encounter (Signed)
Reaching out to patient to offer assistance regarding upcoming cardiac imaging study; pt verbalizes understanding of appt date/time, parking situation and where to check in, pre-test NPO status and verified current allergies; name and call back number provided for further questions should they arise  Gordy Clement RN Navigator Cardiac Imaging [facility] [phone] office [phone] cell  Patient is aware to avoid caffeine 12 hours prior to her cardiac PET scan.

## 2021-12-16 ENCOUNTER — Ambulatory Visit (HOSPITAL_COMMUNITY)
Admission: RE | Admit: 2021-12-16 | Discharge: 2021-12-16 | Disposition: A | Discharge status: Home / Self Care | Payer: PPO | Source: Ambulatory Visit | Attending: Cardiovascular Disease | Admitting: Cardiovascular Disease

## 2021-12-16 DIAGNOSIS — I351 Nonrheumatic aortic (valve) insufficiency: Secondary | ICD-10-CM | POA: Diagnosis not present

## 2021-12-16 DIAGNOSIS — R072 Precordial pain: Secondary | ICD-10-CM | POA: Diagnosis not present

## 2021-12-16 DIAGNOSIS — I10 Essential (primary) hypertension: Secondary | ICD-10-CM | POA: Diagnosis not present

## 2021-12-16 DIAGNOSIS — R9431 Abnormal electrocardiogram [ECG] [EKG]: Secondary | ICD-10-CM | POA: Diagnosis not present

## 2021-12-16 LAB — NM PET CT CARDIAC PERFUSION MULTI W/ABSOLUTE BLOODFLOW
LV dias vol: 55 mL (ref 46–106)
LV sys vol: 21 mL
MBFR: 2.45
Nuc Rest EF: 62 %
Nuc Stress EF: 50 %
Rest MBF: 0.85 ml/g/min
Rest Nuclear Isotope Dose: 14.7 mCi
Rest perfusion cavity size (mL): 50 mL
ST Depression (mm): 0 mm
Stress MBF: 2.08 ml/g/min
Stress Nuclear Isotope Dose: 14.7 mCi
Stress perfusion cavity size (mL): 55 mL
TID: 1.07

## 2021-12-16 MED ORDER — RUBIDIUM RB82 GENERATOR (RUBYFILL)
25.0000 | PACK | Freq: Once | INTRAVENOUS | Status: AC
  Administered 2021-12-16: 14.7 via INTRAVENOUS

## 2021-12-16 MED ORDER — REGADENOSON 0.4 MG/5ML IV SOLN
INTRAVENOUS | Status: AC
  Administered 2021-12-16: 0.4 mg via INTRAVENOUS
  Filled 2021-12-16: qty 5

## 2021-12-16 MED ORDER — REGADENOSON 0.4 MG/5ML IV SOLN: 0.4000 mg | Freq: Once | INTRAVENOUS | Status: AC

## 2021-12-16 NOTE — Progress Notes (Signed)
Patient presents for a cardiac PET stress test. Patient tolerated the test without incident and maintained vitals signs within acceptable parameters. Patient offered caffeine and ambulated out of the department with a steady gait.

## 2021-12-31 ENCOUNTER — Other Ambulatory Visit: Payer: Self-pay | Admitting: Internal Medicine

## 2022-01-01 ENCOUNTER — Other Ambulatory Visit: Payer: Self-pay | Admitting: Internal Medicine

## 2022-01-01 DIAGNOSIS — Z1231 Encounter for screening mammogram for malignant neoplasm of breast: Secondary | ICD-10-CM

## 2022-01-27 ENCOUNTER — Ambulatory Visit
Admission: RE | Admit: 2022-01-27 | Discharge: 2022-01-27 | Disposition: A | Discharge status: Home / Self Care | Payer: PPO | Source: Ambulatory Visit | Attending: Internal Medicine | Admitting: Internal Medicine

## 2022-01-27 DIAGNOSIS — Z1231 Encounter for screening mammogram for malignant neoplasm of breast: Secondary | ICD-10-CM

## 2022-02-18 ENCOUNTER — Encounter: Payer: Self-pay | Admitting: Internal Medicine

## 2022-02-18 ENCOUNTER — Ambulatory Visit (INDEPENDENT_AMBULATORY_CARE_PROVIDER_SITE_OTHER): Payer: PPO | Admitting: Internal Medicine

## 2022-02-18 VITALS — BP 124/80 | HR 72 | Temp 97.9°F | Resp 17 | Ht 66.0 in | Wt 129.2 lb

## 2022-02-18 DIAGNOSIS — E782 Mixed hyperlipidemia: Secondary | ICD-10-CM

## 2022-02-18 DIAGNOSIS — E559 Vitamin D deficiency, unspecified: Secondary | ICD-10-CM

## 2022-02-18 DIAGNOSIS — E039 Hypothyroidism, unspecified: Secondary | ICD-10-CM

## 2022-02-18 DIAGNOSIS — Z79899 Other long term (current) drug therapy: Secondary | ICD-10-CM

## 2022-02-18 DIAGNOSIS — R7309 Other abnormal glucose: Secondary | ICD-10-CM

## 2022-02-18 DIAGNOSIS — R0989 Other specified symptoms and signs involving the circulatory and respiratory systems: Secondary | ICD-10-CM | POA: Diagnosis not present

## 2022-02-18 NOTE — Progress Notes (Signed)
Future Appointments  Date Time Provider Department  02/18/2022  3:30 PM Unk Pinto, MD [facility]  06/02/2022  4:00 PM Alycia Rossetti, NP [facility]  09/02/2022  3:00 PM Alycia Rossetti, NP [facility]    History of Present Illness:       This very nice 71 y.o. single WF  presents for 3 month follow up with HTN, HLD, Pre-Diabetes and Vitamin D Deficiency.        Patient is followed expectantly for labile  HTN    & BP has been controlled and today's BP is at goal  -  124/80. Patient has had no complaints of any cardiac type chest pain, palpitations, dyspnea Vertell Limber /PND, dizziness, claudication or dependent edema.       Hyperlipidemia is controlled with diet & meds. Patient denies myalgias or other med SE's. Last Lipids were T goal with a very high good HDL Chol  - 124 !  Lab Results  Component Value Date   CHOL 230 (H) 09/01/2021   HDL 124 09/01/2021   LDLCALC 89 09/01/2021   LDLDIRECT 73.3 04/30/2011   TRIG 78 09/01/2021   CHOLHDL 1.9 09/01/2021     Also, the patient is monitored for glucose intolerance  and has had no symptoms of reactive hypoglycemia, diabetic polys, paresthesias or visual blurring.  Last A1c was at goal :  Lab Results  Component Value Date   HGBA1C 5.1 09/01/2021                                                          Further, the patient also has history of Vitamin D Deficiency ("25" /2016) and supplements vitamin D . Last vitamin D was at goal :   Lab Results  Component Value Date   VD25OH 96 09/01/2021     Current Outpatient Medications on File Prior to Visit  Medication Sig   ALPRAZolam  0.5 MG tablet TAKE 1/2 - 1 TABLET 2 x /day  AS NEEDED    buPROPion XL 300 MG  TAKE ONE TABLET  EVERY MORNING    VITAMIN D 5000 u  Take  daily.   Gabapentin 100 MG capsule Take 100 mg  daily   Levothyroxine 50 MCG tablet Take 1 tablet Daily    Slippery elm  & Digest basic supplement    SYSTANE COMPLETE Place 1 drop into both eyes daily.    rosuvastatin 5 MG tablet TAKE ONE TABLET  DAILY      Allergies  Allergen Reactions   Influenza Vaccines Nausea And Vomiting and Other (See Comments)    dizziness   Other Nausea And Vomiting and Other (See Comments)    dizziness   Penicillamine Hives   Penicillins     REACTION: Hives   Sulfamethoxazole Hives   Sulfonamide Derivatives     REACTION: Hives     PMHx:   Past Medical History:  Diagnosis Date   Allergy    rhinitis   Aortic stenosis, moderate    on echo in 2015.   Cancer [facility])    breast   Hyperlipidemia    Hypertension    Personal history of chemotherapy 2000   Personal history of radiation therapy 2000     Immunization History  Administered Date(s) Administered   Influenza Split 02/26/2011, 03/11/2012   Influenza, High  Dose Seasonal PF 01/29/2016   Influenza,inj,Quad  05/02/2013   PFIZER-SARS-COV-2 Vacc 06/12/2019, 07/03/2019   Pneumococcal -13 08/05/2017   Pneumococcal -23 01/16/2015   Tdap 10/28/2010     Past Surgical History:  Procedure Laterality Date   ABDOMINAL HYSTERECTOMY     BIOPSY  11/20/2021   Procedure: BIOPSY;  Surgeon: Doran Stabler, MD;  Location: [facility];  Service: Gastroenterology;;   BREAST LUMPECTOMY Right 2000   malignant   COLONOSCOPY WITH PROPOFOL N/A 11/20/2021   Procedure: COLONOSCOPY WITH PROPOFOL;  Surgeon: Doran Stabler, MD;  Location: [facility];  Service: Gastroenterology;  Laterality: N/A;   CYSTOCELE REPAIR N/A 05/12/2018   ESOPHAGOGASTRODUODENOSCOPY (EGD) WITH PROPOFOL N/A 11/20/2021   Procedure: ESOPHAGOGASTRODUODENOSCOPY (EGD) WITH PROPOFOL;  Surgeon: Doran Stabler, MD;  Location: [facility];  Service: Gastroenterology;  Laterality: N/A;   POLYPECTOMY  11/20/2021   Procedure: POLYPECTOMY;  Surgeon: Doran Stabler, MD;  Location: [facility];  Service: Gastroenterology;;    FHx:    Reviewed / unchanged  SHx:    Reviewed / unchanged   Systems Review:  Constitutional: Denies fever,  chills, wt changes, headaches, insomnia, fatigue, night sweats, change in appetite. Eyes: Denies redness, blurred vision, diplopia, discharge, itchy, watery eyes.  ENT: Denies discharge, congestion, post nasal drip, epistaxis, sore throat, earache, hearing loss, dental pain, tinnitus, vertigo, sinus pain, snoring.  CV: Denies chest pain, palpitations, irregular heartbeat, syncope, dyspnea, diaphoresis, orthopnea, PND, claudication or edema. Respiratory: denies cough, dyspnea, DOE, pleurisy, hoarseness, laryngitis, wheezing.  Gastrointestinal: Denies dysphagia, odynophagia, heartburn, reflux, water brash, abdominal pain or cramps, nausea, vomiting, bloating, diarrhea, constipation, hematemesis, melena, hematochezia  or hemorrhoids. Genitourinary: Denies dysuria, frequency, urgency, nocturia, hesitancy, discharge, hematuria or flank pain. Musculoskeletal: Denies arthralgias, myalgias, stiffness, jt. swelling, pain, limping or strain/sprain.  Skin: Denies pruritus, rash, hives, warts, acne, eczema or change in skin lesion(s). Neuro: No weakness, tremor, incoordination, spasms, paresthesia or pain. Psychiatric: Denies confusion, memory loss or sensory loss. Endo: Denies change in weight, skin or hair change.  Heme/Lymph: No excessive bleeding, bruising or enlarged lymph nodes.  Physical Exam  BP 124/80   Pulse 72   Temp 97.9 F (36.6 C)   Resp 17   Ht '5\' 6"'$  (1.676 m)   Wt 129 lb 3.2 oz (58.6 kg)   SpO2 97%   BMI 20.85 kg/m   Appears  well nourished, well groomed  and in no distress.  Eyes: PERRLA, EOMs, conjunctiva no swelling or erythema. Sinuses: No frontal/maxillary tenderness ENT/Mouth: EAC's clear, TM's nl w/o erythema, bulging. Nares clear w/o erythema, swelling, exudates. Oropharynx clear without erythema or exudates. Oral hygiene is good. Tongue normal, non obstructing. Hearing intact.  Neck: Supple. Thyroid not palpable. Car 2+/2+ without bruits, nodes or JVD. Chest:  Respirations nl with BS clear & equal w/o rales, rhonchi, wheezing or stridor.  Cor: Heart sounds normal w/ regular rate and rhythm without sig. murmurs, gallops, clicks or rubs. Peripheral pulses normal and equal  without edema.  Abdomen: Soft & bowel sounds normal. Non-tender w/o guarding, rebound, hernias, masses or organomegaly.  Lymphatics: Unremarkable.  Musculoskeletal: Full ROM all peripheral extremities, joint stability, 5/5 strength and normal gait.  Skin: Warm, dry without exposed rashes, lesions or ecchymosis apparent.  Neuro: Cranial nerves intact, reflexes equal bilaterally. Sensory-motor testing grossly intact. Tendon reflexes grossly intact.  Pysch: Alert & oriented x 3.  Insight and judgement nl & appropriate. No ideations.  Assessment and Plan:  1. Labile hypertension  -  Continue medication, monitor blood pressure at home.  - Continue DASH diet.  Reminder to go to the ER if any CP,  SOB, nausea, dizziness, severe HA, changes vision/speech.   - CBC with Differential/Platelet - COMPLETE METABOLIC PANEL WITH GFR - Magnesium - TSH  2. Hyperlipidemia, mixed  - Continue diet/meds, exercise,& lifestyle modifications.  - Continue monitor periodic cholesterol/liver & renal functions    - Lipid panel - TSH  3. Abnormal glucose  - Continue diet, exercise  - Lifestyle modifications.  - Monitor appropriate labs   - Hemoglobin A1c - Insulin, random  4. Vitamin D deficiency  - Continue supplementation   - VITAMIN D 25 Hydroxy  5. Hypothyroidism  - TSH  6. Medication management  - CBC with Differential/Platelet - COMPLETE METABOLIC PANEL WITH GFR - Magnesium - Lipid panel - TSH - Hemoglobin A1c - Insulin, random - VITAMIN D 25 Hydroxy         Discussed  regular exercise, BP monitoring, weight control to achieve/maintain BMI less than 25 and discussed med and SE's. Recommended labs to assess /monitor clinical status .  I discussed the assessment and  treatment plan with the patient. The patient was provided an opportunity to ask questions and all were answered. The patient agreed with the plan and demonstrated an understanding of the instructions.  I provided over 30 minutes of exam, counseling, chart review and  complex critical decision making.        The patient was advised to call back or seek an in-person evaluation if the symptoms worsen or if the condition fails to improve as anticipated.   Kirtland Bouchard, MD

## 2022-02-18 NOTE — Patient Instructions (Signed)

## 2022-02-19 ENCOUNTER — Other Ambulatory Visit: Payer: Self-pay | Admitting: Internal Medicine

## 2022-02-19 LAB — COMPLETE METABOLIC PANEL WITH GFR
AG Ratio: 2.4 (calc) (ref 1.0–2.5)
ALT: 17 U/L (ref 6–29)
AST: 24 U/L (ref 10–35)
Albumin: 4.4 g/dL (ref 3.6–5.1)
Alkaline phosphatase (APISO): 112 U/L (ref 37–153)
BUN: 20 mg/dL (ref 7–25)
CO2: 25 mmol/L (ref 20–32)
Calcium: 9.3 mg/dL (ref 8.6–10.4)
Chloride: 98 mmol/L (ref 98–110)
Creat: 0.97 mg/dL (ref 0.60–1.00)
Globulin: 1.8 g/dL (calc) — ABNORMAL LOW (ref 1.9–3.7)
Glucose, Bld: 75 mg/dL (ref 65–99)
Potassium: 4 mmol/L (ref 3.5–5.3)
Sodium: 134 mmol/L — ABNORMAL LOW (ref 135–146)
Total Bilirubin: 0.7 mg/dL (ref 0.2–1.2)
Total Protein: 6.2 g/dL (ref 6.1–8.1)
eGFR: 62 mL/min/{1.73_m2} (ref >=60)

## 2022-02-19 LAB — HEMOGLOBIN A1C
Hgb A1c MFr Bld: 5.3 % of total Hgb (ref <5.7)
Mean Plasma Glucose: 105 mg/dL
eAG (mmol/L): 5.8 mmol/L

## 2022-02-19 LAB — CBC WITH DIFFERENTIAL/PLATELET
Absolute Monocytes: 460 cells/uL (ref 200–950)
Basophils Absolute: 53 cells/uL (ref 0–200)
Basophils Relative: 0.9 %
Eosinophils Absolute: 89 cells/uL (ref 15–500)
Eosinophils Relative: 1.5 %
HCT: 35.9 % (ref 35.0–45.0)
Hemoglobin: 12.3 g/dL (ref 11.7–15.5)
Lymphs Abs: 2608 cells/uL (ref 850–3900)
MCH: 31.9 pg (ref 27.0–33.0)
MCHC: 34.3 g/dL (ref 32.0–36.0)
MCV: 93 fL (ref 80.0–100.0)
MPV: 9.4 fL (ref 7.5–12.5)
Monocytes Relative: 7.8 %
Neutro Abs: 2690 cells/uL (ref 1500–7800)
Neutrophils Relative %: 45.6 %
Platelets: 141 10*3/uL (ref 140–400)
RBC: 3.86 10*6/uL (ref 3.80–5.10)
RDW: 12.2 % (ref 11.0–15.0)
Total Lymphocyte: 44.2 %
WBC: 5.9 10*3/uL (ref 3.8–10.8)

## 2022-02-19 LAB — INSULIN, RANDOM: Insulin: 4.7 u[IU]/mL

## 2022-02-19 LAB — MAGNESIUM: Magnesium: 1.7 mg/dL (ref 1.5–2.5)

## 2022-02-19 LAB — LIPID PANEL
Cholesterol: 252 mg/dL — ABNORMAL HIGH (ref <200)
HDL: 122 mg/dL (ref >=50)
LDL Cholesterol (Calc): 115 mg/dL (calc) — ABNORMAL HIGH
Non-HDL Cholesterol (Calc): 130 mg/dL (calc) — ABNORMAL HIGH (ref <130)
Total CHOL/HDL Ratio: 2.1 (calc) (ref <5.0)
Triglycerides: 62 mg/dL (ref <150)

## 2022-02-19 LAB — TSH: TSH: 2.08 mIU/L (ref 0.40–4.50)

## 2022-02-19 LAB — VITAMIN D 25 HYDROXY (VIT D DEFICIENCY, FRACTURES): Vit D, 25-Hydroxy: 86 ng/mL (ref 30–100)

## 2022-02-19 NOTE — Progress Notes (Signed)
<><><><><><><><><><><><><><><><><><><><><><><><><><><><><><><><><> <><><><><><><><><><><><><><><><><><><><><><><><><><><><><><><><><> - Test results slightly outside the reference range are not unusual. If there is anything important, I will review this with you,  otherwise it is considered normal test values.  If you have further questions,  please do not hesitate to contact me at the office or via My Chart.  <><><><><><><><><><><><><><><><><><><><><><><><><><><><><><><><><> <><><><><><><><><><><><><><><><><><><><><><><><><><><><><><><><><>  - Total Chol = 252  Is partially elevated by your high level of the protective HDL Chol      ( Ideal or Goal is less than 180 ! )  - But  - Your  Bad /Dangerous LDL Chol =  115  - - >>  is very high risk for                                                                           Heart Attack /Stroke /Vascular Dementia                                                                                    Sitting on a time Bomb !     ( Ideal or Goal is less than 70 ! )   - Treating with meds to lower Cholesterol is treating the result                                          & NOT treating the cause - The cause is Bad Diet !  - Read or listen to   Dr Wyman Songster 's book    " How Not to Die ! "   - Recommend a stricter plant based low cholesterol diet   - Cholesterol only comes from animal sources                                                                                - ie. meat, dairy, egg yolks - Eat all the vegetables you want.  - Avoid meat, Avoid Meat , Avoid Meat,                                                                especially red meat - Beef AND Pork .  - Avoid cheese & dairy - milk & ice cream.   - Cheese is the most concentrated form of trans-fats which  is the worst thing to clog up our  arteries.   - Veggie cheese is OK which can be found in the fresh  produce section at [employer] or [employer] or [employer] <><><><><><><><><><><><><><><><><><><><><><><><><><><><><><><><><>  -  Magnesium  -  1.7   -  very  low- goal is betw 2.0 - 2.5,   - So..............Marland Kitchen  Recommend that you take  Magnesium 500 mg tablet daily   - also important to eat lots of  leafy green vegetables   - spinach - Kale - collards - greens - okra - asparagus                                                                     - broccoli - quinoa - squash - almonds   - black, red, white beans -  peas - green beans <><><><><><><><><><><><><><><><><><><><><><><><><><><><><><><><><> <><><><><><><><><><><><><><><><><><><><><><><><><><><><><><><><><>  -  A1c - Normal - No Diabetes  - Great ! <><><><><><><><><><><><><><><><><><><><><><><><><><><><><><><><><>  - Vitamin D = 86  - Excellent    -  Please keep dose same   - Vitamin D goal is between 70-100.   - It is very important as a natural anti-inflammatory and helping the  immune system protect against viral infections, like the Covid-19   - helping hair, skin, and nails, as well as reducing stroke and heart attack risk.   - It helps your bones and helps with mood.  - It also decreases numerous cancer risks so please  take it as directed.   - Low Vit D is associated with a 200-300% higher risk for CANCER   and 200-300% higher risk for HEART   ATTACK  &  STROKE.    - It is also associated with higher death rate at younger ages,   - Also autoimmune diseases like Rheumatoid arthritis, Lupus and  Multiple Sclerosis.     - Also many other serious conditions, like depression, Alzheimer's Dementia,                                                                               muscle aches, fatigue, fibromyalgia   <><><><><><><><><><><><><><><><><><><><><><><><><><><><><><><><><> <><><><><><><><><><><><><><><><><><><><><><><><><><><><><><><><><>  - All Else - CBC - Kidneys - Electrolytes - Liver - Magnesium & Thyroid    - all  Normal / OK <><><><><><><><><><><><><><><><><><><><><><><><><><><><><><><><><> <><><><><><><><><><><><><><><><><><><><><><><><><><><><><><><><><>  - Keep up the Great Work  !  <><><><><><><><><><><><><><><><><><><><><><><><><><><><><><><><><> <><><><><><><><><><><><><><><><><><><><><><><><><><><><><><><><><>

## 2022-02-20 ENCOUNTER — Other Ambulatory Visit: Payer: Self-pay | Admitting: Nurse Practitioner

## 2022-02-22 ENCOUNTER — Encounter: Payer: Self-pay | Admitting: Internal Medicine

## 2022-03-09 DIAGNOSIS — L570 Actinic keratosis: Secondary | ICD-10-CM | POA: Diagnosis not present

## 2022-03-09 DIAGNOSIS — D485 Neoplasm of uncertain behavior of skin: Secondary | ICD-10-CM | POA: Diagnosis not present

## 2022-03-09 DIAGNOSIS — L308 Other specified dermatitis: Secondary | ICD-10-CM | POA: Diagnosis not present

## 2022-03-09 DIAGNOSIS — C44729 Squamous cell carcinoma of skin of left lower limb, including hip: Secondary | ICD-10-CM | POA: Diagnosis not present

## 2022-03-09 DIAGNOSIS — Z85828 Personal history of other malignant neoplasm of skin: Secondary | ICD-10-CM | POA: Diagnosis not present

## 2022-03-09 DIAGNOSIS — L821 Other seborrheic keratosis: Secondary | ICD-10-CM | POA: Diagnosis not present

## 2022-04-01 ENCOUNTER — Encounter: Payer: Self-pay | Admitting: Internal Medicine

## 2022-05-04 ENCOUNTER — Other Ambulatory Visit: Payer: Self-pay

## 2022-05-04 MED ORDER — ROSUVASTATIN CALCIUM 5 MG PO TABS: ORAL_TABLET | ORAL | 1 refills | Status: DC

## 2022-05-07 DIAGNOSIS — D485 Neoplasm of uncertain behavior of skin: Secondary | ICD-10-CM | POA: Diagnosis not present

## 2022-05-07 DIAGNOSIS — L570 Actinic keratosis: Secondary | ICD-10-CM | POA: Diagnosis not present

## 2022-05-07 DIAGNOSIS — C44729 Squamous cell carcinoma of skin of left lower limb, including hip: Secondary | ICD-10-CM | POA: Diagnosis not present

## 2022-05-07 DIAGNOSIS — Z85828 Personal history of other malignant neoplasm of skin: Secondary | ICD-10-CM | POA: Diagnosis not present

## 2022-06-02 ENCOUNTER — Encounter: Payer: Self-pay | Admitting: Nurse Practitioner

## 2022-06-02 ENCOUNTER — Ambulatory Visit (INDEPENDENT_AMBULATORY_CARE_PROVIDER_SITE_OTHER): Payer: PPO | Admitting: Nurse Practitioner

## 2022-06-02 VITALS — BP 112/78 | HR 72 | Temp 97.5°F | Ht 66.0 in | Wt 127.2 lb

## 2022-06-02 DIAGNOSIS — Z6821 Body mass index (BMI) 21.0-21.9, adult: Secondary | ICD-10-CM | POA: Diagnosis not present

## 2022-06-02 DIAGNOSIS — Z853 Personal history of malignant neoplasm of breast: Secondary | ICD-10-CM | POA: Diagnosis not present

## 2022-06-02 DIAGNOSIS — E039 Hypothyroidism, unspecified: Secondary | ICD-10-CM | POA: Diagnosis not present

## 2022-06-02 DIAGNOSIS — Z0000 Encounter for general adult medical examination without abnormal findings: Secondary | ICD-10-CM | POA: Diagnosis not present

## 2022-06-02 DIAGNOSIS — I10 Essential (primary) hypertension: Secondary | ICD-10-CM | POA: Diagnosis not present

## 2022-06-02 DIAGNOSIS — Z0001 Encounter for general adult medical examination with abnormal findings: Secondary | ICD-10-CM | POA: Diagnosis not present

## 2022-06-02 DIAGNOSIS — R6889 Other general symptoms and signs: Secondary | ICD-10-CM | POA: Diagnosis not present

## 2022-06-02 DIAGNOSIS — E871 Hypo-osmolality and hyponatremia: Secondary | ICD-10-CM | POA: Diagnosis not present

## 2022-06-02 DIAGNOSIS — G4709 Other insomnia: Secondary | ICD-10-CM

## 2022-06-02 DIAGNOSIS — M85852 Other specified disorders of bone density and structure, left thigh: Secondary | ICD-10-CM | POA: Diagnosis not present

## 2022-06-02 DIAGNOSIS — R7309 Other abnormal glucose: Secondary | ICD-10-CM | POA: Diagnosis not present

## 2022-06-02 DIAGNOSIS — E559 Vitamin D deficiency, unspecified: Secondary | ICD-10-CM

## 2022-06-02 DIAGNOSIS — Z79899 Other long term (current) drug therapy: Secondary | ICD-10-CM

## 2022-06-02 DIAGNOSIS — E782 Mixed hyperlipidemia: Secondary | ICD-10-CM

## 2022-06-02 DIAGNOSIS — I350 Nonrheumatic aortic (valve) stenosis: Secondary | ICD-10-CM

## 2022-06-02 DIAGNOSIS — F418 Other specified anxiety disorders: Secondary | ICD-10-CM

## 2022-06-02 MED ORDER — TRAZODONE HCL 50 MG PO TABS: ORAL_TABLET | ORAL | 2 refills | Status: DC

## 2022-06-02 MED ORDER — ALPRAZOLAM 0.5 MG PO TABS: ORAL_TABLET | ORAL | 0 refills | Status: DC

## 2022-06-02 NOTE — Patient Instructions (Signed)

## 2022-06-02 NOTE — Progress Notes (Signed)
MEDICARE ANNUAL WELLNESS VISIT AND FOLLOW UP  Assessment:   Encounter for Medicare annual wellness exam 1 year  Essential hypertension At goal without medications at this time Monitor blood pressure at home; call if consistently over 130/80 Continue DASH diet.   Reminder to go to the ER if any CP, SOB, nausea, dizziness, severe HA, changes vision/speech, left arm numbness and tingling and jaw pain. Check CBC  Mixed hyperlipidemia Continue low cholesterol diet and exercise.  Check lipid panel.  Check CMP  Hypothyroidism Please take your thyroid medication greater than 30 min before breakfast, separated by at least 4 hours  from antacids, calcium, iron, and multivitamins.  - TSH  BMI 21.0-21.9, adult Continue to recommend diet heavy in fruits and veggies and low in animal meats, cheeses, and dairy products, appropriate calorie intake Discuss exercise recommendations routinely Continue to monitor weight at each visit  Aortic stenosis, moderate Followed by Dr. Johnsie Cancel  Abnormal Glucose Continue diet and exercise - A1c  Vitamin D deficient osteomalacia Vitamin D at goal at recent check; continue to recommend supplementation for goal of 70-100 Defer vitamin D level   Depression/anxiety Doing well with wellbutrin, continue  Osteopenia, left neck femur Continue vitamin D, add weight bearing exercises.  Get with MGM this year  Medication Management Continued  H/O right breast Cancer Yearly mammograms    Over 30 minutes of face to face interview, exam, counseling, chart review and critical decision making was performed Declines labs, will get next OV Future Appointments  Date Time Provider [place]  09/02/2022  3:00 PM Alycia Rossetti, NP [facility] None     Plan:   During the course of the visit the patient was educated and counseled about appropriate screening and preventive services including:   Pneumococcal vaccine  Prevnar 13 Influenza  vaccine Td vaccine Screening electrocardiogram Bone densitometry screening Colorectal cancer screening Diabetes screening Glaucoma screening Nutrition counseling  Advanced directives: requested   Subjective:  Kathryn Chavez is a 72 y.o. female who presents for Medicare Annual Wellness Visit and 3 month follow up.    She also has significant hx/o R lumpectomy in 2001 treated with Chemoradiation and followed with 5 years of Arimidex.   She is followed by Dr. Johnsie Cancel for moderate aortic stenosis.   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    She is doing very well on the wellbutrin, she very rarely takes xanax.   BMI is Body mass index is 20.53 kg/m., she has been working on diet and exercise. She is eating well Wt Readings from Last 3 Encounters:  06/02/22 127 lb 3.2 oz (57.7 kg)  02/18/22 129 lb 3.2 oz (58.6 kg)  11/20/21 125 lb (56.7 kg)    Her blood pressure has been controlled at home, today their BP is BP: 112/78 BP Readings from Last 3 Encounters:  06/02/22 112/78  02/18/22 124/80  11/20/21 133/72    She does workout. She denies chest pain, shortness of breath, dizziness.   She is on cholesterol medication, increased to 1 tablet of the crestor 5 mg  last visit and denies myalgias. Her cholesterol is not at goal. The cholesterol last visit was:   Lab Results  Component Value Date   CHOL 252 (H) 02/18/2022   HDL 122 02/18/2022   LDLCALC 115 (H) 02/18/2022   LDLDIRECT 73.3 04/30/2011   TRIG 62 02/18/2022   CHOLHDL 2.1 02/18/2022  Last A1C in the office was:  Lab Results  Component Value Date   HGBA1C 5.3 02/18/2022   Last GFR: Lab Results  Component Value Date   EGFR 62 02/18/2022    Patient is on Vitamin D supplement and at goal at recent check:    Lab Results  Component Value Date   VD25OH 86 02/18/2022      Medication Review: Current Outpatient  Medications on File Prior to Visit  Medication Sig Dispense Refill   ALPRAZolam (XANAX) 0.5 MG tablet TAKE 1/2 - 1 TABLET UP TO TWICE DAILY AS NEEDED FOR ANXIETY, SLEEP (LIMIT TO LESS THAN 5 DAYS A WEEK) 30 tablet 0   buPROPion (WELLBUTRIN XL) 300 MG 24 hr tablet TAKE ONE TABLET BY MOUTH EVERY MORNING FOR MOOD, ANXIETY, FOCUS AND CONCENTRATION 90 tablet 3   Cholecalciferol (VITAMIN D) 125 MCG (5000 UT) CAPS Take 5,000 Units by mouth daily.     gabapentin (NEURONTIN) 100 MG capsule TAKE ONE CAPSULE THREE TIMES DAILY. (Patient taking differently: Take 100 mg by mouth daily.) 270 capsule 3   levothyroxine (SYNTHROID) 50 MCG tablet Take 1 tablet Daily on an empty stomach with only water for 30 minutes & no Antacid meds, Calcium or Magnesium for 4 hours & avoid Biotin 90 tablet 3   rosuvastatin (CRESTOR) 5 MG tablet TAKE ONE TABLET ONCE DAILY FOR CHOLESTEROL 90 tablet 1   Na Sulfate-K Sulfate-Mg Sulf 17.5-3.13-1.6 GM/177ML SOLN Take 1 kit by mouth as directed. (Patient not taking: Reported on 06/02/2022) 354 mL 0   No current facility-administered medications on file prior to visit.    Allergies  Allergen Reactions   Influenza Vaccines Nausea And Vomiting and Other (See Comments)    dizziness   Other Nausea And Vomiting and Other (See Comments)    dizziness   Penicillamine Hives   Penicillins     REACTION: Hives   Sulfamethoxazole Hives   Sulfonamide Derivatives     REACTION: Hives    Current Problems (verified) Patient Active Problem List   Diagnosis Date Noted   Protein-calorie malnutrition ([place]) 10/30/2020   Hypothyroidism 10/02/2019   Osteopenia 11/04/2017   Onychomycosis of great toe 09/08/2017   Aortic stenosis, moderate 09/09/2016   Hemangioma 06/23/2016   Dermatofibroma 12/24/2015   Hyperlipidemia 01/16/2015   Corns and callus 06/26/2014   Seborrheic keratosis 06/26/2014   Neoplasm, benign 06/26/2014   Abnormal ECG 99991111   Neoplasm, uncertain whether benign or  malignant 07/19/2013   Female cystocele 02/09/2011   Vitamin D deficient osteomalacia 10/28/2010   Essential hypertension 12/16/2009   Allergic rhinitis 12/16/2009    Screening Tests Immunization History  Administered Date(s) Administered   Influenza Split 02/26/2011, 03/11/2012   Influenza, High Dose Seasonal PF 01/29/2016   Influenza,inj,Quad PF,6+ Mos 05/02/2013   PFIZER(Purple Top)SARS-COV-2 Vaccination 06/12/2019, 07/03/2019   Pneumococcal Conjugate-13 08/05/2017   Pneumococcal Polysaccharide-23 01/16/2015   Tdap 10/28/2010   Health Maintenance  Topic Date Due   COVID-19 Vaccine (3 - Pfizer risk series) 06/18/2022 (Originally 07/31/2019)   Zoster Vaccines- Shingrix (1 of 2) 08/31/2022 (Originally 05/20/1969)   Medicare Annual Wellness ([facility])  06/03/2023   MAMMOGRAM  01/28/2024   COLONOSCOPY (Pts 45-33yr Insurance coverage will need to be confirmed)  11/21/2031   Pneumonia Vaccine 72 Years old  Completed   DEXA SCAN  Completed   Hepatitis C Screening  Completed   HPV VACCINES  Aged Out   DTaP/Tdap/Td  Discontinued   INFLUENZA VACCINE  Discontinued   Health Maintenance  Topic  Date Due   COVID-19 Vaccine (3 - Pfizer risk series) 06/18/2022 (Originally 07/31/2019)   Zoster Vaccines- Shingrix (1 of 2) 08/31/2022 (Originally 05/20/1969)   Medicare Annual Wellness ([facility])  06/03/2023   MAMMOGRAM  01/28/2024   COLONOSCOPY (Pts 45-11yr Insurance coverage will need to be confirmed)  11/21/2031   Pneumonia Vaccine 72 Years old  Completed   DEXA SCAN  Completed   Hepatitis C Screening  Completed   HPV VACCINES  Aged Out   DTaP/Tdap/Td  Discontinued   INFLUENZA VACCINE  Discontinued     Names of Other Physician/Practitioners you currently use: 1.  [facility] here for primary care 2. Dr. [place] eye doctor, last visit 2024 3. [employer] dPharmacist, community last visit 2024  Patient Care Team: MUnk Pinto MD as PCP - General (Internal  Medicine) NJosue Hector MD as PCP - Cardiology (Cardiology) [place] Heather, [place](Optometry)  SURGICAL HISTORY She  has a past surgical history that includes Abdominal hysterectomy; Cystocele repair (N/A, 05/12/2018); Breast lumpectomy (Right, 2000); Esophagogastroduodenoscopy (egd) with propofol (N/A, 11/20/2021); Colonoscopy with propofol (N/A, 11/20/2021); biopsy (11/20/2021); and polypectomy (11/20/2021). FAMILY HISTORY Her family history includes Arthritis in an other family member; Breast cancer in an other family member; Breast cancer (age of onset: 371 in her daughter; Breast cancer (age of onset: 460 in her daughter; Breast cancer (age of onset: 731 in her mother; Colitis in her daughter; Hyperlipidemia in an other family member; Lung cancer in her mother and another family member; Other in her father; Prostate cancer in an other family member. SOCIAL HISTORY She  reports that she has never smoked. She has never used smokeless tobacco. She reports that she does not drink alcohol and does not use drugs.  MEDICARE WELLNESS OBJECTIVES: Physical activity: Current Exercise Habits: Home exercise routine, Type of exercise: walking, Time (Minutes): 20, Frequency (Times/Week): 3, Weekly Exercise (Minutes/Week): 60, Intensity: Mild, Exercise limited by: None identified Cardiac risk factors: Cardiac Risk Factors include: advanced age (>5109m, >6>63omen);dyslipidemia;sedentary lifestyle Depression/mood screen:      06/02/2022    4:14 PM  Depression screen PHQ 2/9  Decreased Interest 0  Down, Depressed, Hopeless 0  PHQ - 2 Score 0    ADLs:     06/02/2022    4:14 PM  In your present state of health, do you have any difficulty performing the following activities:  Hearing? 0  Vision? 0  Difficulty concentrating or making decisions? 0  Walking or climbing stairs? 0  Dressing or bathing? 0  Doing errands, shopping? 0      Cognitive Testing  Alert? Yes  Normal Appearance?Yes  Oriented to person?  Yes  Place? Yes   Time? Yes  Recall of three objects?  Yes  Can perform simple calculations? Yes  Displays appropriate judgment?Yes  Can read the correct time from a watch face?Yes  EOL planning: Does Patient Have a Medical Advance Directive?: Yes Type of Advance Directive: Healthcare Power of Attorney, Living will Does patient want to make changes to medical advance directive?: No - Patient declined Copy of [place] Chart?: No - copy requested  Review of Systems  Constitutional:  Negative for chills, fever, malaise/fatigue and weight loss.  HENT:  Negative for congestion, hearing loss and tinnitus.   Eyes:  Negative for blurred vision and double vision.  Respiratory:  Negative for cough, sputum production, shortness of breath and wheezing.   Cardiovascular:  Negative for chest pain, palpitations, orthopnea, claudication, leg swelling and  PND.  Gastrointestinal:  Negative for abdominal pain, blood in stool, constipation, diarrhea, heartburn, melena, nausea and vomiting.  Genitourinary: Negative.   Musculoskeletal:  Negative for falls, joint pain and myalgias.  Skin:  Negative for rash.  Neurological:  Negative for dizziness, tingling, tremors, sensory change, loss of consciousness, weakness and headaches.  Endo/Heme/Allergies:  Negative for polydipsia.  Psychiatric/Behavioral: Negative.  Negative for depression, memory loss, substance abuse and suicidal ideas. The patient is not nervous/anxious and does not have insomnia.   All other systems reviewed and are negative.    Objective:     Today's Vitals   06/02/22 1602  BP: 112/78  Pulse: 72  Temp: (!) 97.5 F (36.4 C)  SpO2: 99%  Weight: 127 lb 3.2 oz (57.7 kg)  Height: 5' 6"$  (1.676 m)   Body mass index is 20.53 kg/m.  General appearance: alert, no distress, WD/WN, female HEENT: normocephalic, sclerae anicteric, TMs pearly, nares patent, no discharge or erythema, pharynx normal Oral cavity: MMM, no  lesions Lymphatics: Left submandibular adenopathy, nontender Neck: supple, no lymphadenopathy, no thyromegaly, no masses Heart: RRR, normal S1, S2, systolic murmur radiation to the carotids. Lungs: CTA bilaterally, no wheezes, rhonchi, or rales Abdomen: +bs, soft, non tender, non distended, no masses, no hepatomegaly, no splenomegaly Musculoskeletal: nontender, no swelling, no obvious deformity Extremities: no edema, no cyanosis, no clubbing Pulses: 2+ symmetric, upper and lower extremities, normal cap refill Neurological: alert, oriented x 3, CN2-12 intact, strength normal upper extremities and lower extremities, sensation normal throughout, DTRs 2+ throughout, no cerebellar signs, gait normal Psychiatric: normal affect, behavior normal, pleasant   Medicare Attestation I have personally reviewed: The patient's medical and social history Their use of alcohol, tobacco or illicit drugs Their current medications and supplements The patient's functional ability including ADLs,fall risks, home safety risks, cognitive, and hearing and visual impairment Diet and physical activities Evidence for depression or mood disorders  The patient's weight, height, BMI, and visual acuity have been recorded in the chart.  I have made referrals, counseling, and provided education to the patient based on review of the above and I have provided the patient with a written personalized care plan for preventive services.     Alycia Rossetti, NP    05/27/2020

## 2022-06-03 LAB — CBC WITH DIFFERENTIAL/PLATELET
Absolute Monocytes: 485 cells/uL (ref 200–950)
Basophils Absolute: 40 cells/uL (ref 0–200)
Basophils Relative: 0.7 %
Eosinophils Absolute: 68 cells/uL (ref 15–500)
Eosinophils Relative: 1.2 %
HCT: 35.4 % (ref 35.0–45.0)
Hemoglobin: 12 g/dL (ref 11.7–15.5)
Lymphs Abs: 2474 cells/uL (ref 850–3900)
MCH: 31.9 pg (ref 27.0–33.0)
MCHC: 33.9 g/dL (ref 32.0–36.0)
MCV: 94.1 fL (ref 80.0–100.0)
MPV: 9.5 fL (ref 7.5–12.5)
Monocytes Relative: 8.5 %
Neutro Abs: 2633 cells/uL (ref 1500–7800)
Neutrophils Relative %: 46.2 %
Platelets: 141 10*3/uL (ref 140–400)
RBC: 3.76 10*6/uL — ABNORMAL LOW (ref 3.80–5.10)
RDW: 12.3 % (ref 11.0–15.0)
Total Lymphocyte: 43.4 %
WBC: 5.7 10*3/uL (ref 3.8–10.8)

## 2022-06-03 LAB — COMPLETE METABOLIC PANEL WITH GFR
AG Ratio: 2.7 (calc) — ABNORMAL HIGH (ref 1.0–2.5)
ALT: 20 U/L (ref 6–29)
AST: 21 U/L (ref 10–35)
Albumin: 4.6 g/dL (ref 3.6–5.1)
Alkaline phosphatase (APISO): 127 U/L (ref 37–153)
BUN: 17 mg/dL (ref 7–25)
CO2: 24 mmol/L (ref 20–32)
Calcium: 9.4 mg/dL (ref 8.6–10.4)
Chloride: 104 mmol/L (ref 98–110)
Creat: 0.9 mg/dL (ref 0.60–1.00)
Globulin: 1.7 g/dL (calc) — ABNORMAL LOW (ref 1.9–3.7)
Glucose, Bld: 82 mg/dL (ref 65–99)
Potassium: 3.9 mmol/L (ref 3.5–5.3)
Sodium: 140 mmol/L (ref 135–146)
Total Bilirubin: 0.4 mg/dL (ref 0.2–1.2)
Total Protein: 6.3 g/dL (ref 6.1–8.1)
eGFR: 68 mL/min/{1.73_m2} (ref >=60)

## 2022-06-03 LAB — LIPID PANEL
Cholesterol: 210 mg/dL — ABNORMAL HIGH (ref <200)
HDL: 123 mg/dL (ref >=50)
LDL Cholesterol (Calc): 70 mg/dL (calc)
Non-HDL Cholesterol (Calc): 87 mg/dL (calc) (ref <130)
Total CHOL/HDL Ratio: 1.7 (calc) (ref <5.0)
Triglycerides: 88 mg/dL (ref <150)

## 2022-06-03 LAB — TSH: TSH: 1.52 mIU/L (ref 0.40–4.50)

## 2022-06-12 DIAGNOSIS — M25511 Pain in right shoulder: Secondary | ICD-10-CM | POA: Diagnosis not present

## 2022-06-24 DIAGNOSIS — M6281 Muscle weakness (generalized): Secondary | ICD-10-CM | POA: Diagnosis not present

## 2022-06-24 DIAGNOSIS — S46011D Strain of muscle(s) and tendon(s) of the rotator cuff of right shoulder, subsequent encounter: Secondary | ICD-10-CM | POA: Diagnosis not present

## 2022-06-24 DIAGNOSIS — M7541 Impingement syndrome of right shoulder: Secondary | ICD-10-CM | POA: Diagnosis not present

## 2022-07-07 DIAGNOSIS — N952 Postmenopausal atrophic vaginitis: Secondary | ICD-10-CM | POA: Diagnosis not present

## 2022-07-24 DIAGNOSIS — S46011A Strain of muscle(s) and tendon(s) of the rotator cuff of right shoulder, initial encounter: Secondary | ICD-10-CM | POA: Diagnosis not present

## 2022-07-30 ENCOUNTER — Ambulatory Visit (INDEPENDENT_AMBULATORY_CARE_PROVIDER_SITE_OTHER): Payer: PPO | Admitting: Internal Medicine

## 2022-07-30 ENCOUNTER — Ambulatory Visit: Payer: PPO | Admitting: Nurse Practitioner

## 2022-07-30 ENCOUNTER — Encounter: Payer: Self-pay | Admitting: Internal Medicine

## 2022-07-30 VITALS — BP 110/70 | HR 100 | Temp 98.0°F | Resp 18 | Ht 66.0 in | Wt 122.0 lb

## 2022-07-30 VITALS — BP 110/70 | HR 100 | Temp 98.0°F | Ht 66.0 in | Wt 122.0 lb

## 2022-07-30 DIAGNOSIS — G4483 Primary cough headache: Secondary | ICD-10-CM

## 2022-07-30 DIAGNOSIS — U071 COVID-19: Secondary | ICD-10-CM

## 2022-07-30 LAB — POC COVID19 BINAXNOW: SARS Coronavirus 2 Ag: POSITIVE — AB

## 2022-07-30 LAB — POC INFLUENZA A&B (BINAX/QUICKVUE)
Influenza A, POC: NEGATIVE
Influenza B, POC: NEGATIVE

## 2022-07-30 NOTE — Progress Notes (Signed)
  (+)    C  [facility]  I  D       D E  F  E  R  R  E  D         To     TELEPHONE         VISIT

## 2022-07-31 ENCOUNTER — Ambulatory Visit (INDEPENDENT_AMBULATORY_CARE_PROVIDER_SITE_OTHER): Payer: PPO | Admitting: Nurse Practitioner

## 2022-07-31 DIAGNOSIS — R051 Acute cough: Secondary | ICD-10-CM

## 2022-07-31 DIAGNOSIS — J0140 Acute pansinusitis, unspecified: Secondary | ICD-10-CM

## 2022-07-31 DIAGNOSIS — U071 COVID-19: Secondary | ICD-10-CM | POA: Diagnosis not present

## 2022-07-31 MED ORDER — DOXYCYCLINE HYCLATE 100 MG PO TABS: 100.0000 mg | ORAL_TABLET | Freq: Two times a day (BID) | ORAL | 0 refills | Status: AC

## 2022-07-31 MED ORDER — PROMETHAZINE-DM 6.25-15 MG/5ML PO SYRP
5.0000 mL | ORAL_SOLUTION | Freq: Four times a day (QID) | ORAL | 0 refills | Status: DC | PRN
Start: 2022-07-31 — End: 2022-09-08

## 2022-07-31 NOTE — Progress Notes (Signed)
THIS ENCOUNTER IS A VIRTUAL VISIT DUE TO COVID-19 - PATIENT WAS NOT SEEN IN THE OFFICE.  PATIENT HAS CONSENTED TO VIRTUAL VISIT / TELEMEDICINE VISIT   Virtual Visit via telephone Note  I connected with  Kathryn Chavez on 07/31/2022 by telephone.  I verified that I am speaking with the correct person using two identifiers.    I discussed the limitations of evaluation and management by telemedicine and the availability of in person appointments. The patient expressed understanding and agreed to proceed.  History of Present Illness:  There were no vitals taken for this visit. 72 y.o. patient contacted office reporting URI sx cough, congestion. she tested positive by in house rapid test. OV was conducted by telephone to minimize exposure. This patient has been vaccinated for covid 19, last 06/2019  Sx began 2 days ago with cough, congestion, fatigue.   Treatments tried so far: OTC cold medication.  Exposures: Unknown   Medications  Current Outpatient Medications (Endocrine & Metabolic):    levothyroxine (SYNTHROID) 50 MCG tablet, Take 1 tablet Daily on an empty stomach with only water for 30 minutes & no Antacid meds, Calcium or Magnesium for 4 hours & avoid Biotin  Current Outpatient Medications (Cardiovascular):    rosuvastatin (CRESTOR) 5 MG tablet, TAKE ONE TABLET ONCE DAILY FOR CHOLESTEROL  Current Outpatient Medications (Respiratory):    promethazine-dextromethorphan (PROMETHAZINE-DM) 6.25-15 MG/5ML syrup, Take 5 mLs by mouth 4 (four) times daily as needed for cough.    Current Outpatient Medications (Other):    ALPRAZolam (XANAX) 0.5 MG tablet, TAKE 1/2 - 1 TABLET UP TO TWICE DAILY AS NEEDED FOR ANXIETY, SLEEP (LIMIT TO LESS THAN 5 DAYS A WEEK)   buPROPion (WELLBUTRIN XL) 300 MG 24 hr tablet, TAKE ONE TABLET BY MOUTH EVERY MORNING FOR MOOD, ANXIETY, FOCUS AND CONCENTRATION   Cholecalciferol (VITAMIN D) 125 MCG (5000 UT) CAPS, Take 5,000 Units by mouth daily.   gabapentin  (NEURONTIN) 100 MG capsule, TAKE ONE CAPSULE THREE TIMES DAILY. (Patient taking differently: Take 100 mg by mouth daily.)   Na Sulfate-K Sulfate-Mg Sulf 17.5-3.13-1.6 GM/177ML SOLN, Take 1 kit by mouth as directed. (Patient not taking: Reported on 07/30/2022)   traZODone (DESYREL) 50 MG tablet, 1/2-1 tablet for sleep  Allergies:  Allergies  Allergen Reactions   Influenza Vaccines Nausea And Vomiting and Other (See Comments)    dizziness   Other Nausea And Vomiting and Other (See Comments)    dizziness   Penicillamine Hives   Penicillins     REACTION: Hives   Sulfamethoxazole Hives   Sulfonamide Derivatives     REACTION: Hives    Problem list She has Essential hypertension; Allergic rhinitis; Vitamin D deficient osteomalacia; Abnormal ECG; Hyperlipidemia; Aortic stenosis, moderate; Female cystocele; Onychomycosis of great toe; Osteopenia; Hypothyroidism; Protein-calorie malnutrition; Corns and callus; Seborrheic keratosis; Neoplasm, uncertain whether benign or malignant; Neoplasm, benign; Hemangioma; and Dermatofibroma on their problem list.   Social History:   reports that she has never smoked. She has never used smokeless tobacco. She reports that she does not drink alcohol and does not use drugs.  Observations/Objective:  General : Well sounding patient in no apparent distress HEENT: no hoarseness, no cough for duration of visit Lungs: speaks in complete sentences, no audible wheezing, no apparent distress Neurological: alert, oriented x 3 Psychiatric: pleasant, judgement appropriate   Assessment and Plan:  Covid 19 Covid 19 positive per rapid screening test in office, rapid test. Risk factors include:  has Essential hypertension; Allergic rhinitis; Vitamin D deficient  osteomalacia; Abnormal ECG; Hyperlipidemia; Aortic stenosis, moderate; Female cystocele; Onychomycosis of great toe; Osteopenia; Hypothyroidism; Protein-calorie malnutrition; Corns and callus; Seborrheic  keratosis; Neoplasm, uncertain whether benign or malignant; Neoplasm, benign; Hemangioma; and Dermatofibroma on their problem list. Symptoms are: mild Due to co morbid conditions and risk factors, discussed antivirals  Immue support reviewed Vitamin C, Vitamin D, Zinc. Take tylenol PRN temp 101+ Push hydration Regular ambulation or calf exercises exercises for clot prevention and 81 mg ASA unless contraindicated Sx supportive therapy suggested Follow up via mychart or telephone if needed Advised patient obtain O2 monitor; present to ED if persistently <90% or with severe dyspnea, CP, fever uncontrolled by tylenol, confusion, sudden decline  Meds ordered this encounter  Medications   doxycycline (VIBRA-TABS) 100 MG tablet    Sig: Take 1 tablet (100 mg total) by mouth 2 (two) times daily for 5 days.    Dispense:  10 tablet    Refill:  0    Order Specific Question:   Supervising Provider    Answer:   Lucky Cowboy [phone]   promethazine-dextromethorphan (PROMETHAZINE-DM) 6.25-15 MG/5ML syrup    Sig: Take 5 mLs by mouth 4 (four) times daily as needed for cough.    Dispense:  240 mL    Refill:  0    Order Specific Question:   Supervising Provider    Answer:   Lucky Cowboy [phone]   Follow Up Instructions:  I discussed the assessment and treatment plan with the patient. The patient was provided an opportunity to ask questions and all were answered. The patient agreed with the plan and demonstrated an understanding of the instructions.   The patient was advised to call back or seek an in-person evaluation if the symptoms worsen or if the condition fails to improve as anticipated.  I provided 15 minutes of non-face-to-face time during this encounter.   Adela Glimpse, NP

## 2022-08-04 DIAGNOSIS — D485 Neoplasm of uncertain behavior of skin: Secondary | ICD-10-CM | POA: Diagnosis not present

## 2022-08-04 DIAGNOSIS — C44729 Squamous cell carcinoma of skin of left lower limb, including hip: Secondary | ICD-10-CM | POA: Diagnosis not present

## 2022-08-04 DIAGNOSIS — Z85828 Personal history of other malignant neoplasm of skin: Secondary | ICD-10-CM | POA: Diagnosis not present

## 2022-08-04 DIAGNOSIS — L570 Actinic keratosis: Secondary | ICD-10-CM | POA: Diagnosis not present

## 2022-08-09 NOTE — Patient Instructions (Signed)
COVID-19 COVID-19 is an infection caused by a virus called SARS-CoV-2. Most people who get COVID-19 have mild to moderate symptoms. Some have little to no symptoms. In others, the virus may cause a severe infection. What are the causes? COVID-19 is caused by a coronavirus. The virus may be in the air as droplets or as tiny specks of fluid (aerosols). It may also be on surfaces. You may catch the virus if you: Breathe in droplets when a person with COVID-19 breathes, speaks, sings, coughs, or sneezes. Touch something that has the virus on it and then touch your mouth, nose, or eyes. What increases the risk? Risk for infection: You are more likely to get COVID-19 if: You are within 6 ft (1.8 m) of a person who has COVID-19 for 15 minutes or longer. You provide care to a person who has COVID-19. You are in close contact with others. This includes hugging, kissing, or sharing utensils. Risk for serious illness caused by COVID-19: You are more likely to get very ill from COVID-19 if: You have cancer. You have a long-term (chronic) disease. This may be: A chronic lung disease, such as pulmonary embolism, chronic obstructive pulmonary disease (COPD), or cystic fibrosis. A disease that affects your body's defense system (immune system). If you have a weak immune system, you are said to be immunocompromised. A serious heart condition, such as heart failure, coronary artery disease, or cardiomyopathy. Diabetes. Chronic kidney disease. A liver disease, such as cirrhosis, nonalcoholic fatty liver disease, alcoholic liver disease, or autoimmune hepatitis. You are obese. You are pregnant or were just pregnant. You have sickle cell disease. What are the signs or symptoms? Symptoms of COVID-19 can range from mild to severe. They may appear any time from 2 to 14 days after you are exposed. They include: Fever or chills. Shortness of breath or trouble breathing. Feeling tired. Headaches, body aches, or  muscle aches. A runny or stuffy nose. Sneezing, coughing, or a sore throat. New loss of taste or smell. You may also have stomach problems, such as nausea, vomiting, or diarrhea. In some cases, you may not have any symptoms. How is this diagnosed? COVID-19 may be diagnosed by testing a sample to check for the virus. The most common tests are the PCR test and the antigen test. Tests may be done in the lab or at home. They include: Using a swab to take a sample of fluid from your nose. Testing a sample of saliva from your mouth. Testing a sample of mucus from your lungs (sputum). How is this treated? Treatment for COVID-19 depends on how severe your condition is. Mild symptoms can be treated at home. You should rest, drink fluids, and take over-the-counter medicine. If you have symptoms and risk factors, you may be prescribed a medicine that fights viruses (antiviral). Severe symptoms may be treated in a hospital intensive care unit (ICU). Treatment may include: Extra oxygen given through a tube in the nose, a face mask, or a hood. Medicines. These may include: Antivirals, such as remdesivir. Anti-inflammatories, such as corticosteroids. These help reduce inflammation. Antithrombotics. These help prevent or treat blood clots. Convalescent plasma. This helps boost your immune system. Prone positioning. This is when you are laid on your stomach to help oxygen get into your lungs. Infection control measures. If you are at risk for a more serious illness, your health care provider may prescribe two medicines to help your immune system protect you. These are called long-acting monoclonal antibodies. They are given together   every 6 months. How is this prevented? To protect yourself: Get the vaccine or vaccine series if you meet the guidelines. You can even get the vaccine while you are pregnant or making breast milk (lactating). Get an added dose of the vaccine if you are immunocompromised. This  applies if you have had an organ transplant or if you have a condition that affects your immune system. You should get the added dose 4 weeks after you got the first one. If you get an mRNA vaccine, you will need to get 3 doses. Talk to your provider about getting experimental monoclonal antibodies. This treatment can help prevent severe illness. It may be given to you if: You are immunocompromised. You cannot get the vaccine. You may not get the vaccine if you have a severe allergic reaction to it or to what it is made of. You are not fully vaccinated. You are in a place where there is COVID-19 and: You are in close contact with someone who has COVID-19. You are at high risk of being exposed. You are at risk of illness from new variants of the virus. To protect others: If you have symptoms of COVID-19, take steps to stop the virus from spreading. Stay home. Leave your house only to get medical care. Do not use public transit. Do not travel while you are sick. Wash your hands often with soap and water for at least 20 seconds. If soap and water are not available, use alcohol-based hand sanitizer. Make sure that all people in your household wash their hands well and often. Cough or sneeze into a tissue or your sleeve or elbow. Do not cough or sneeze into your hand or into the air. Where to find more information Centers for Disease Control and Prevention (CDC): cdc.gov World Health Organization (WHO): who.int Get help right away if: You have trouble breathing. You have pain or pressure in your chest. You are confused. Your lips or fingernails turn blue. You have trouble waking from sleep. Your symptoms get worse. These symptoms may be an emergency. Get help right away. Call 911. Do not wait to see if the symptoms will go away. Do not drive yourself to the hospital. This information is not intended to replace advice given to you by your health care provider. Make sure you discuss any  questions you have with your health care provider. Document Revised: 12/19/2021 Document Reviewed: 12/19/2021 Elsevier Patient Education  2023 Elsevier Inc.  

## 2022-08-25 NOTE — Progress Notes (Signed)
NO SHOW

## 2022-09-02 ENCOUNTER — Encounter: Payer: PPO | Admitting: Nurse Practitioner

## 2022-09-07 NOTE — Progress Notes (Unsigned)
COMPLETE PHYSICAL   Assessment:   Encounter for routine medical examination- CALL PT WITH LABS 1 year MAMMOGRAM UTD 01/27/22 - negative Last Dexa 12/21/19 T score -1.4 Colonoscopy 11/20/21- next due 2033    Essential hypertension At goal without medications at this time Monitor blood pressure at home; call if consistently over 130/80 Continue DASH diet.   Reminder to go to the ER if any CP, SOB, nausea, dizziness, severe HA, changes vision/speech, left arm numbness and tingling and jaw pain. - CBC - CMP  Mixed hyperlipidemia Continue low cholesterol diet , Rosuvastatin 5 mg and exercise.  Check lipid panel.   BMI 22.0-22.9, adult Continue to recommend diet heavy in fruits and veggies and low in animal meats, cheeses, and dairy products, appropriate calorie intake Discuss exercise recommendations routinely Continue to monitor weight at each visit  Aortic stenosis, moderate Followed by Dr. Eden Emms  Abnormal Glucose Continue diet and exercise - A1c  History of breast cancer Completed right lumpectomy 2001, chemo , radiation and 5 years of Arimidex  Seasonal allergic rhinitis, unspecified trigger - Allegra OTC, increase H20, allergy hygiene explained.  Vitamin D deficient osteomalacia Vitamin D at goal at recent check; continue to recommend supplementation for goal of 70-100 Vitamin D level   Hypothyroidism Taking levothyroxine 88 mcg daily Reminder to take on an empty stomach 30-58mins before first meal of the day. No antacid medications for 4 hours. - TSH  Depression/anxiety Doing well with wellbutrin, continue  Osteopenia, left neck femur Continue vitamin D, add weight bearing exercises.  Last DEXA 2021   Medication management -     CBC with Differential/Platelet -     COMPLETE METABOLIC PANEL WITH GFR -     Magnesium -     Lipid panel -     TSH -     Hemoglobin A1c -     VITAMIN D 25 Hydroxy (Vit-D Deficiency, Fractures) -     EKG 12-Lead -     [place],  RETROPERITNL ABD,  LTD -     Urinalysis, Routine w reflex microscopic -     Microalbumin / creatinine urine ratio  Screening for ischemic heart disease -     EKG 12-Lead  Screening for hematuria or proteinuria -     Urinalysis, Routine w reflex microscopic -     Microalbumin / creatinine urine ratio  Screening for AAA (abdominal aortic aneurysm) -     [place], RETROPERITNL ABD,  LTD  Epigastric mass Palpable today under sternum in epigastric area -     US Abdomen Complete; Future  Diarrhea of infectious origin -     Gastrointestinal Pathogen Pnl RT, PCR -     C. difficile GDH and Toxin A/B -     Giardia antigen -     Cryptosporidium Antigen, EIA  Unintentional weight loss If TSH is less than 2 will plan to stop levothyroxine to determine if weight will increase Instructed on high protein and calorie dense foods    Over 30 minutes of face to face interview, exam, counseling, chart review and critical decision making was performed Declines labs, will get next OV Future Appointments  Date Time Provider Department Center  09/08/2023  3:00 PM Raynelle Dick, NP [facility] None      Subjective:  Kathryn Chavez is a 72 y.o. female who presents for complete physical and follow up has Essential hypertension; Allergic rhinitis; Vitamin D deficient osteomalacia; Abnormal ECG; Hyperlipidemia; Aortic stenosis, moderate; Female cystocele; Onychomycosis of great toe;  Osteopenia; Hypothyroidism; Protein-calorie malnutrition (HCC); Corns and callus; Seborrheic keratosis; Neoplasm, uncertain whether benign or malignant; Neoplasm, benign; Hemangioma; and Dermatofibroma on their problem list.   She just got back from [place]  8 days ago and had diarrhea and vomiting with some worms noted in stool. The diarrhea has resolved.   She is currently on levothyroxine 50 mcg daily for hypothyroidism Lab Results  Component Value Date   TSH 1.52 06/02/2022    She also has significant hx/o R lumpectomy in  2001 treated with Chemo radiation and followed with 5 years of Arimidex.   She is followed by Dr. Eden Emms for moderate aortic stenosis. She was evaluated this AM and a cardiac calcium score CTA is ordered. To take lopressor 50 mg and nitro prior to procedure. Will send lab results from today to Dr Eden Emms  Patient has been treated with Gabapentin for shingles neuralgia of the Left eye (2014) post Shingles of the left face, she takes 100mg  BID.    She has a hernia- possibly hiatal hernia  She is doing very well on the wellbutrin, she very rarely takes xanax. Last fill Alprazolam 0.5 mg # 30 0 refills on 06/02/22.  BMI is Body mass index is 19.5 kg/m., she has been working on diet and exercise. She has been losing weight without trying. She has been alternating between constipation and diarrhea. 1 year ago she weighed 126.  Wt Readings from Last 3 Encounters:  09/08/22 120 lb 12.8 oz (54.8 kg)  07/30/22 122 lb (55.3 kg)  07/30/22 122 lb (55.3 kg)    Her blood pressure has been controlled at home, today their BP is BP: 132/70   BP Readings from Last 3 Encounters:  09/08/22 132/70  07/30/22 110/70  07/30/22 110/70  She does workout. She denies chest pain, shortness of breath, dizziness.    She is on cholesterol medication, increased to 1 tablet of the crestor 5mg  and denies myalgias. Her cholesterol is at goal. The cholesterol last visit was:   Lab Results  Component Value Date   CHOL 210 (H) 06/02/2022   HDL 123 06/02/2022   LDLCALC 70 06/02/2022   LDLDIRECT 73.3 04/30/2011   TRIG 88 06/02/2022   CHOLHDL 1.7 06/02/2022    Last A1C in the office was:  Lab Results  Component Value Date   HGBA1C 5.3 02/18/2022   She is trying to drink more water. Last GFR: Lab Results  Component Value Date   EGFR 68 06/02/2022    Patient is on Vitamin D supplement and at goal at recent check:    Lab Results  Component Value Date   VD25OH 86 02/18/2022      Medication Review: Current  Outpatient Medications on File Prior to Visit  Medication Sig Dispense Refill   ALPRAZolam (XANAX) 0.5 MG tablet TAKE 1/2 - 1 TABLET UP TO TWICE DAILY AS NEEDED FOR ANXIETY, SLEEP (LIMIT TO LESS THAN 5 DAYS A WEEK) 30 tablet 0   buPROPion (WELLBUTRIN XL) 300 MG 24 hr tablet TAKE ONE TABLET BY MOUTH EVERY MORNING FOR MOOD, ANXIETY, FOCUS AND CONCENTRATION 90 tablet 3   Cholecalciferol (VITAMIN D) 125 MCG (5000 UT) CAPS Take 5,000 Units by mouth daily.     estradiol (ESTRACE) 0.1 MG/GM vaginal cream Place vaginally.     gabapentin (NEURONTIN) 100 MG capsule TAKE ONE CAPSULE THREE TIMES DAILY. (Patient taking differently: Take 100 mg by mouth daily.) 270 capsule 3   levothyroxine (SYNTHROID) 50 MCG tablet Take 1 tablet Daily on  an empty stomach with only water for 30 minutes & no Antacid meds, Calcium or Magnesium for 4 hours & avoid Biotin 90 tablet 3   meloxicam (MOBIC) 7.5 MG tablet Take 7.5 mg by mouth 2 (two) times daily. PRN     rosuvastatin (CRESTOR) 5 MG tablet TAKE ONE TABLET ONCE DAILY FOR CHOLESTEROL 90 tablet 1   traZODone (DESYREL) 50 MG tablet 1/2-1 tablet for sleep 30 tablet 2   Na Sulfate-K Sulfate-Mg Sulf 17.5-3.13-1.6 GM/177ML SOLN Take 1 kit by mouth as directed. (Patient not taking: Reported on 09/08/2022) 354 mL 0   promethazine-dextromethorphan (PROMETHAZINE-DM) 6.25-15 MG/5ML syrup Take 5 mLs by mouth 4 (four) times daily as needed for cough. (Patient not taking: Reported on 09/08/2022) 240 mL 0   No current facility-administered medications on file prior to visit.    Allergies  Allergen Reactions   Influenza Vaccines Nausea And Vomiting and Other (See Comments)    dizziness   Other Nausea And Vomiting and Other (See Comments)    dizziness   Penicillamine Hives   Penicillins     REACTION: Hives   Sulfamethoxazole Hives   Sulfonamide Derivatives     REACTION: Hives    Current Problems (verified) Patient Active Problem List   Diagnosis Date Noted   Protein-calorie  malnutrition (HCC) 10/30/2020   Hypothyroidism 10/02/2019   Osteopenia 11/04/2017   Onychomycosis of great toe 09/08/2017   Aortic stenosis, moderate 09/09/2016   Hemangioma 06/23/2016   Dermatofibroma 12/24/2015   Hyperlipidemia 01/16/2015   Corns and callus 06/26/2014   Seborrheic keratosis 06/26/2014   Neoplasm, benign 06/26/2014   Abnormal ECG 01/23/2014   Neoplasm, uncertain whether benign or malignant 07/19/2013   Female cystocele 02/09/2011   Vitamin D deficient osteomalacia 10/28/2010   Essential hypertension 12/16/2009   Allergic rhinitis 12/16/2009    Screening Tests Immunization History  Administered Date(s) Administered   Influenza Split 02/26/2011, 03/11/2012   Influenza, High Dose Seasonal PF 01/29/2016   Influenza,inj,Quad PF,6+ Mos 05/02/2013   PFIZER(Purple Top)SARS-COV-2 Vaccination 06/12/2019, 07/03/2019   Pneumococcal Conjugate-13 08/05/2017   Pneumococcal Polysaccharide-23 01/16/2015   Tdap 10/28/2010   Health Maintenance  Topic Date Due   Zoster Vaccines- Shingrix (1 of 2) Never done   COVID-19 Vaccine (3 - Pfizer risk series) 07/31/2019   Medicare Annual Wellness (AWV)  06/03/2023   MAMMOGRAM  01/28/2024   COLONOSCOPY (Pts 45-72yrs Insurance coverage will need to be confirmed)  11/21/2031   Pneumonia Vaccine 51+ Years old  Completed   DEXA SCAN  Completed   Hepatitis C Screening  Completed   HPV VACCINES  Aged Out   DTaP/Tdap/Td  Discontinued   INFLUENZA VACCINE  Discontinued     Names of Other Physician/Practitioners you currently use: 1. [facility] here for primary care 2. Dr. [place], eye doctor, 2022 3. Dr. Marland Kitchen , dentist, last visit 2018 due for follow up   Patient Care Team: Lucky Cowboy, MD as PCP - General (Internal Medicine) Wendall Stade, MD as PCP - Cardiology (Cardiology) [place], Heather, OD (Optometry)  SURGICAL HISTORY She  has a past surgical history that includes Abdominal hysterectomy;  Cystocele repair (N/A, 05/12/2018); Breast lumpectomy (Right, 2000); Esophagogastroduodenoscopy (egd) with propofol (N/A, 11/20/2021); Colonoscopy with propofol (N/A, 11/20/2021); biopsy (11/20/2021); and polypectomy (11/20/2021). FAMILY HISTORY Her family history includes Arthritis in an other family member; Breast cancer in an other family member; Breast cancer (age of onset: 22) in her daughter; Breast cancer (age of onset: 21) in her daughter;  Breast cancer (age of onset: 2) in her mother; Colitis in her daughter; Hyperlipidemia in an other family member; Lung cancer in her mother and another family member; Other in her father; Prostate cancer in an other family member. SOCIAL HISTORY She  reports that she has never smoked. She has never used smokeless tobacco. She reports that she does not drink alcohol and does not use drugs.   Review of Systems  Constitutional:  Positive for malaise/fatigue and weight loss.  HENT:  Negative for hearing loss and tinnitus.   Eyes:  Negative for blurred vision and double vision.  Respiratory:  Negative for cough, sputum production, shortness of breath and wheezing.   Cardiovascular:  Negative for chest pain, palpitations, orthopnea, claudication, leg swelling and PND.  Gastrointestinal:  Positive for diarrhea. Negative for abdominal pain, blood in stool, constipation, heartburn, melena, nausea and vomiting.       Small protrusion under sternal notch,  Genitourinary: Negative.   Musculoskeletal:  Negative for falls, joint pain and myalgias.  Skin:  Negative for rash.  Neurological:  Negative for dizziness, tingling, sensory change, weakness and headaches.  Endo/Heme/Allergies:  Negative for polydipsia.  Psychiatric/Behavioral: Negative.  Negative for depression, memory loss, substance abuse and suicidal ideas. The patient is not nervous/anxious and does not have insomnia.   All other systems reviewed and are negative.    Objective:     Today's Vitals    09/08/22 1514  BP: 132/70  Pulse: (!) 48  Temp: (!) 97.5 F (36.4 C)  SpO2: 98%  Weight: 120 lb 12.8 oz (54.8 kg)  Height: 5\' 6"  (1.676 m)   Body mass index is 19.5 kg/m.  General appearance: thin female in no distress  HEENT: normocephalic, sclerae anicteric, TMs pearly, nares patent, no discharge or erythema, pharynx normal Oral cavity: MMM, no lesions Neck: supple, no lymphadenopathy, no thyromegaly, no masses Heart: RRR, normal S1, S2, systolic murmur radiation to the carotids Lungs: CTA bilaterally, no wheezes, rhonchi, or rales Abdomen: +bs, soft, soft mildly tender mass palpated under sternum in epigastric area Musculoskeletal: nontender, no swelling, no obvious deformity Extremities: no edema, no cyanosis, no clubbing Pulses: 2+ symmetric, upper and lower extremities, normal cap refill Neurological: alert, oriented x 3, CN2-12 intact, strength normal upper extremities and lower extremities, sensation normal throughout, DTRs 2+ throughout, no cerebellar signs, gait normal Psychiatric: normal affect, behavior normal, pleasant  Pelvic/GU: defer to GYN EKG:NSR, IRBBB, PAC, no ST changes AAA: < 3 cm   [facility]  09/08/2022

## 2022-09-08 ENCOUNTER — Encounter: Payer: Self-pay | Admitting: Nurse Practitioner

## 2022-09-08 ENCOUNTER — Ambulatory Visit (INDEPENDENT_AMBULATORY_CARE_PROVIDER_SITE_OTHER): Payer: PPO | Admitting: Nurse Practitioner

## 2022-09-08 ENCOUNTER — Other Ambulatory Visit: Payer: Self-pay | Admitting: Nurse Practitioner

## 2022-09-08 VITALS — BP 132/70 | HR 48 | Temp 97.5°F | Ht 64.5 in | Wt 120.8 lb

## 2022-09-08 DIAGNOSIS — I700 Atherosclerosis of aorta: Secondary | ICD-10-CM | POA: Diagnosis not present

## 2022-09-08 DIAGNOSIS — Z136 Encounter for screening for cardiovascular disorders: Secondary | ICD-10-CM

## 2022-09-08 DIAGNOSIS — Z0001 Encounter for general adult medical examination with abnormal findings: Secondary | ICD-10-CM

## 2022-09-08 DIAGNOSIS — L905 Scar conditions and fibrosis of skin: Secondary | ICD-10-CM | POA: Diagnosis not present

## 2022-09-08 DIAGNOSIS — D0462 Carcinoma in situ of skin of left upper limb, including shoulder: Secondary | ICD-10-CM | POA: Diagnosis not present

## 2022-09-08 DIAGNOSIS — E039 Hypothyroidism, unspecified: Secondary | ICD-10-CM

## 2022-09-08 DIAGNOSIS — Z79899 Other long term (current) drug therapy: Secondary | ICD-10-CM | POA: Diagnosis not present

## 2022-09-08 DIAGNOSIS — Z0000 Encounter for general adult medical examination without abnormal findings: Secondary | ICD-10-CM

## 2022-09-08 DIAGNOSIS — Z6821 Body mass index (BMI) 21.0-21.9, adult: Secondary | ICD-10-CM

## 2022-09-08 DIAGNOSIS — C44719 Basal cell carcinoma of skin of left lower limb, including hip: Secondary | ICD-10-CM | POA: Diagnosis not present

## 2022-09-08 DIAGNOSIS — L821 Other seborrheic keratosis: Secondary | ICD-10-CM | POA: Diagnosis not present

## 2022-09-08 DIAGNOSIS — E559 Vitamin D deficiency, unspecified: Secondary | ICD-10-CM | POA: Diagnosis not present

## 2022-09-08 DIAGNOSIS — Z85828 Personal history of other malignant neoplasm of skin: Secondary | ICD-10-CM | POA: Diagnosis not present

## 2022-09-08 DIAGNOSIS — Z853 Personal history of malignant neoplasm of breast: Secondary | ICD-10-CM

## 2022-09-08 DIAGNOSIS — M85852 Other specified disorders of bone density and structure, left thigh: Secondary | ICD-10-CM

## 2022-09-08 DIAGNOSIS — M792 Neuralgia and neuritis, unspecified: Secondary | ICD-10-CM

## 2022-09-08 DIAGNOSIS — I10 Essential (primary) hypertension: Secondary | ICD-10-CM

## 2022-09-08 DIAGNOSIS — R634 Abnormal weight loss: Secondary | ICD-10-CM

## 2022-09-08 DIAGNOSIS — E782 Mixed hyperlipidemia: Secondary | ICD-10-CM

## 2022-09-08 DIAGNOSIS — Z1389 Encounter for screening for other disorder: Secondary | ICD-10-CM

## 2022-09-08 DIAGNOSIS — L570 Actinic keratosis: Secondary | ICD-10-CM | POA: Diagnosis not present

## 2022-09-08 DIAGNOSIS — C44712 Basal cell carcinoma of skin of right lower limb, including hip: Secondary | ICD-10-CM | POA: Diagnosis not present

## 2022-09-08 DIAGNOSIS — L820 Inflamed seborrheic keratosis: Secondary | ICD-10-CM | POA: Diagnosis not present

## 2022-09-08 DIAGNOSIS — D485 Neoplasm of uncertain behavior of skin: Secondary | ICD-10-CM | POA: Diagnosis not present

## 2022-09-08 DIAGNOSIS — R7309 Other abnormal glucose: Secondary | ICD-10-CM | POA: Diagnosis not present

## 2022-09-08 DIAGNOSIS — R1906 Epigastric swelling, mass or lump: Secondary | ICD-10-CM

## 2022-09-08 DIAGNOSIS — A09 Infectious gastroenteritis and colitis, unspecified: Secondary | ICD-10-CM

## 2022-09-08 DIAGNOSIS — I350 Nonrheumatic aortic (valve) stenosis: Secondary | ICD-10-CM

## 2022-09-08 DIAGNOSIS — F418 Other specified anxiety disorders: Secondary | ICD-10-CM

## 2022-09-08 DIAGNOSIS — D0471 Carcinoma in situ of skin of right lower limb, including hip: Secondary | ICD-10-CM | POA: Diagnosis not present

## 2022-09-08 LAB — CBC WITH DIFFERENTIAL/PLATELET
Absolute Monocytes: 631 cells/uL (ref 200–950)
Eosinophils Absolute: 53 cells/uL (ref 15–500)
MCH: 31.1 pg (ref 27.0–33.0)
Platelets: 186 10*3/uL (ref 140–400)

## 2022-09-08 MED ORDER — GABAPENTIN 100 MG PO CAPS
ORAL_CAPSULE | ORAL | 3 refills | Status: DC
Start: 2022-09-08 — End: 2022-09-08

## 2022-09-08 MED ORDER — BUPROPION HCL ER (XL) 300 MG PO TB24
ORAL_TABLET | ORAL | 3 refills | Status: AC
Start: 2022-09-08 — End: ?

## 2022-09-08 NOTE — Patient Instructions (Signed)
YOU CAN CALL TO MAKE AN ULTRASOUND..  I have put in an order for an abdominal ultrasound for you to have You can set them up at your convenience by calling this number [phone] [facility]  If you have any issues call our office and we will set this up for you.

## 2022-09-09 ENCOUNTER — Other Ambulatory Visit: Payer: Self-pay | Admitting: Nurse Practitioner

## 2022-09-09 DIAGNOSIS — R710 Precipitous drop in hematocrit: Secondary | ICD-10-CM

## 2022-09-09 DIAGNOSIS — A09 Infectious gastroenteritis and colitis, unspecified: Secondary | ICD-10-CM | POA: Diagnosis not present

## 2022-09-09 LAB — URINALYSIS, ROUTINE W REFLEX MICROSCOPIC
Bilirubin Urine: NEGATIVE
Glucose, UA: NEGATIVE
Hgb urine dipstick: NEGATIVE
Ketones, ur: NEGATIVE
Leukocytes,Ua: NEGATIVE
Nitrite: NEGATIVE
Protein, ur: NEGATIVE
Specific Gravity, Urine: 1.004 (ref 1.001–1.035)
pH: 5.5 (ref 5.0–8.0)

## 2022-09-09 LAB — COMPLETE METABOLIC PANEL WITH GFR
AG Ratio: 3.1 (calc) — ABNORMAL HIGH (ref 1.0–2.5)
ALT: 21 U/L (ref 6–29)
AST: 23 U/L (ref 10–35)
Albumin: 4.3 g/dL (ref 3.6–5.1)
Alkaline phosphatase (APISO): 97 U/L (ref 37–153)
BUN: 14 mg/dL (ref 7–25)
CO2: 26 mmol/L (ref 20–32)
Calcium: 9.4 mg/dL (ref 8.6–10.4)
Chloride: 100 mmol/L (ref 98–110)
Creat: 0.86 mg/dL (ref 0.60–1.00)
Globulin: 1.4 g/dL (calc) — ABNORMAL LOW (ref 1.9–3.7)
Glucose, Bld: 75 mg/dL (ref 65–99)
Potassium: 4.4 mmol/L (ref 3.5–5.3)
Sodium: 136 mmol/L (ref 135–146)
Total Bilirubin: 0.6 mg/dL (ref 0.2–1.2)
Total Protein: 5.7 g/dL — ABNORMAL LOW (ref 6.1–8.1)
eGFR: 71 mL/min/{1.73_m2} (ref >=60)

## 2022-09-09 LAB — LIPID PANEL
Cholesterol: 163 mg/dL (ref <200)
HDL: 76 mg/dL (ref >=50)
LDL Cholesterol (Calc): 73 mg/dL (calc)
Non-HDL Cholesterol (Calc): 87 mg/dL (calc) (ref <130)
Total CHOL/HDL Ratio: 2.1 (calc) (ref <5.0)
Triglycerides: 68 mg/dL (ref <150)

## 2022-09-09 LAB — CBC WITH DIFFERENTIAL/PLATELET
Basophils Absolute: 38 cells/uL (ref 0–200)
Basophils Relative: 0.5 %
Eosinophils Relative: 0.7 %
HCT: 32.7 % — ABNORMAL LOW (ref 35.0–45.0)
Hemoglobin: 10.9 g/dL — ABNORMAL LOW (ref 11.7–15.5)
Lymphs Abs: 2189 cells/uL (ref 850–3900)
MCHC: 33.3 g/dL (ref 32.0–36.0)
MCV: 93.4 fL (ref 80.0–100.0)
MPV: 9.3 fL (ref 7.5–12.5)
Monocytes Relative: 8.3 %
Neutro Abs: 4689 cells/uL (ref 1500–7800)
Neutrophils Relative %: 61.7 %
RBC: 3.5 10*6/uL — ABNORMAL LOW (ref 3.80–5.10)
RDW: 12.5 % (ref 11.0–15.0)
Total Lymphocyte: 28.8 %
WBC: 7.6 10*3/uL (ref 3.8–10.8)

## 2022-09-09 LAB — MICROALBUMIN / CREATININE URINE RATIO
Creatinine, Urine: 24 mg/dL (ref 20–275)
Microalb, Ur: <0.2 mg/dL

## 2022-09-09 LAB — HEMOGLOBIN A1C
Hgb A1c MFr Bld: 5.4 % of total Hgb (ref <5.7)
Mean Plasma Glucose: 108 mg/dL
eAG (mmol/L): 6 mmol/L

## 2022-09-09 LAB — MAGNESIUM: Magnesium: 1.7 mg/dL (ref 1.5–2.5)

## 2022-09-09 LAB — TSH: TSH: 1.14 mIU/L (ref 0.40–4.50)

## 2022-09-09 LAB — VITAMIN D 25 HYDROXY (VIT D DEFICIENCY, FRACTURES): Vit D, 25-Hydroxy: 92 ng/mL (ref 30–100)

## 2022-09-10 LAB — C. DIFFICILE GDH AND TOXIN A/B
GDH ANTIGEN: NOT DETECTED
MICRO NUMBER:: 14993578

## 2022-09-11 ENCOUNTER — Ambulatory Visit
Admission: RE | Admit: 2022-09-11 | Discharge: 2022-09-11 | Disposition: A | Discharge status: Home / Self Care | Payer: PPO | Source: Ambulatory Visit | Attending: Nurse Practitioner | Admitting: Nurse Practitioner

## 2022-09-11 DIAGNOSIS — K469 Unspecified abdominal hernia without obstruction or gangrene: Secondary | ICD-10-CM | POA: Diagnosis not present

## 2022-09-11 DIAGNOSIS — R1906 Epigastric swelling, mass or lump: Secondary | ICD-10-CM

## 2022-09-11 LAB — GASTROINTESTINAL PATHOGEN PNL
CampyloBacter Group: NOT DETECTED
Norovirus GI/GII: NOT DETECTED
Rotavirus A: NOT DETECTED
Salmonella species: NOT DETECTED
Shiga Toxin 1: NOT DETECTED
Shiga Toxin 2: NOT DETECTED
Shigella Species: NOT DETECTED
Vibrio Group: NOT DETECTED
Yersinia enterocolitica: NOT DETECTED

## 2022-09-11 LAB — GIARDIA ANTIGEN
MICRO NUMBER:: 14991864
RESULT:: NOT DETECTED
SPECIMEN QUALITY:: ADEQUATE

## 2022-09-11 LAB — CRYPTOSPORIDIUM ANTIGEN, EIA
Specimen Quality:: ADEQUATE
micro Number:: 14991304

## 2022-09-11 LAB — C. DIFFICILE GDH AND TOXIN A/B
SPECIMEN QUALITY:: ADEQUATE
TOXIN A AND B: NOT DETECTED

## 2022-09-15 ENCOUNTER — Ambulatory Visit (INDEPENDENT_AMBULATORY_CARE_PROVIDER_SITE_OTHER): Payer: PPO

## 2022-09-15 DIAGNOSIS — R710 Precipitous drop in hematocrit: Secondary | ICD-10-CM

## 2022-09-15 LAB — CBC WITH DIFFERENTIAL/PLATELET
Absolute Monocytes: 540 cells/uL (ref 200–950)
Basophils Absolute: 48 cells/uL (ref 0–200)
Basophils Relative: 0.8 %
Eosinophils Absolute: 72 cells/uL (ref 15–500)
Eosinophils Relative: 1.2 %
HCT: 32.2 % — ABNORMAL LOW (ref 35.0–45.0)
Hemoglobin: 10.7 g/dL — ABNORMAL LOW (ref 11.7–15.5)
Lymphs Abs: 1620 cells/uL (ref 850–3900)
MCH: 31.7 pg (ref 27.0–33.0)
MCHC: 33.2 g/dL (ref 32.0–36.0)
MCV: 95.3 fL (ref 80.0–100.0)
MPV: 9.4 fL (ref 7.5–12.5)
Monocytes Relative: 9 %
Neutro Abs: 3720 cells/uL (ref 1500–7800)
Neutrophils Relative %: 62 %
Platelets: 169 10*3/uL (ref 140–400)
RBC: 3.38 10*6/uL — ABNORMAL LOW (ref 3.80–5.10)
RDW: 12.5 % (ref 11.0–15.0)
Total Lymphocyte: 27 %
WBC: 6 10*3/uL (ref 3.8–10.8)

## 2022-09-15 NOTE — Progress Notes (Signed)
Patient presents today to recheck her hemoglobin.

## 2022-09-17 ENCOUNTER — Telehealth: Payer: Self-pay | Admitting: Nurse Practitioner

## 2022-09-17 ENCOUNTER — Other Ambulatory Visit: Payer: Self-pay | Admitting: Nurse Practitioner

## 2022-09-17 DIAGNOSIS — R710 Precipitous drop in hematocrit: Secondary | ICD-10-CM

## 2022-09-17 NOTE — Telephone Encounter (Signed)
Pt. S requesting a nasal spray called ipratropium.

## 2022-09-17 NOTE — Telephone Encounter (Signed)
I don't see this on her med list or history.  Was she prescribed this previously by another doctor

## 2022-09-22 ENCOUNTER — Other Ambulatory Visit: Payer: Self-pay | Admitting: Nurse Practitioner

## 2022-09-22 DIAGNOSIS — J309 Allergic rhinitis, unspecified: Secondary | ICD-10-CM

## 2022-09-22 MED ORDER — IPRATROPIUM BROMIDE 0.03 % NA SOLN
2.0000 | Freq: Three times a day (TID) | NASAL | 2 refills | Status: DC
Start: 2022-09-22 — End: 2023-06-21

## 2022-09-26 ENCOUNTER — Encounter: Payer: Self-pay | Admitting: Nurse Practitioner

## 2022-10-13 ENCOUNTER — Telehealth: Payer: Self-pay

## 2022-10-13 DIAGNOSIS — I350 Nonrheumatic aortic (valve) stenosis: Secondary | ICD-10-CM

## 2022-10-13 NOTE — Telephone Encounter (Signed)
Placed order for echo.

## 2022-10-13 NOTE — Progress Notes (Signed)
Cardiology Office Note    Date:  10/23/2022   ID:  Kathryn Chavez, DOB 1951/01/30, MRN [id number]  PCP:  Lucky Cowboy, MD  Cardiologist:  Dr. Eden Emms   Chief Complaint: Aortic Stenosis   History of Present Illness:   Kathryn Chavez is a 72 y.o. female with hx of HTN, HLD, breast cancer and aortic stenosis Her BP and lipids are diet controlled   Works at [employer]   Reviewed TTE done 02/22/18 EF 60-65% moderate AS mean gradient 19 peak 34 mmHg And now moderate AR  Reviewed TTE done 05/15/20 EF 60-65% moderate AS mean grdient 20 mmhg peak 35 mmHg DVI 0.28 AVA 1.1 cm2 and moderate AR  Reviewed TTE done 08/29/21  EF 60-65% moderate AS mean gradient 26.7 mmHg peak 45.3 mmHg DVI 0.32 AVA 0.9 cm2 and mild/moderate AR   No chest pain, dyspnea, palpitations or syncope  Enjoys a glass of wine every night   Living at [facility] since December 2022  PET/CT 12/16/21 poor quality study Normal MVFR 2.45 Rest EF 62% but stress 50% Severe calcium in LAD small anterolateral defect reversible with normal wall motion Low risk  Has lost 10-15 lbs ? IBS Primary stopped her synthroid but her TSH was normal     Past Medical History:  Diagnosis Date   Allergy    rhinitis   Aortic stenosis, moderate    on echo in 2015.   Cancer [facility])    breast   Hyperlipidemia    Hypertension    Personal history of chemotherapy 2000   Personal history of radiation therapy 2000    Past Surgical History:  Procedure Laterality Date   ABDOMINAL HYSTERECTOMY     BIOPSY  11/20/2021   Procedure: BIOPSY;  Surgeon: Sherrilyn Rist, MD;  Location: [facility];  Service: Gastroenterology;;   BREAST LUMPECTOMY Right 2000   malignant   COLONOSCOPY WITH PROPOFOL N/A 11/20/2021   Procedure: COLONOSCOPY WITH PROPOFOL;  Surgeon: Sherrilyn Rist, MD;  Location: [facility];  Service: Gastroenterology;  Laterality: N/A;   CYSTOCELE REPAIR N/A 05/12/2018   ESOPHAGOGASTRODUODENOSCOPY (EGD) WITH PROPOFOL N/A  11/20/2021   Procedure: ESOPHAGOGASTRODUODENOSCOPY (EGD) WITH PROPOFOL;  Surgeon: Sherrilyn Rist, MD;  Location: [facility];  Service: Gastroenterology;  Laterality: N/A;   POLYPECTOMY  11/20/2021   Procedure: POLYPECTOMY;  Surgeon: Sherrilyn Rist, MD;  Location: [facility];  Service: Gastroenterology;;    Current Medications: Prior to Admission medications   Medication Sig Start Date End Date Taking? Authorizing Provider  ALPRAZolam (XANAX XR) 0.5 MG 24 hr tablet Take 1 tablet (0.5 mg total) by mouth daily. 08/20/16   Quentin Mulling, PA-C  azelastine (ASTELIN) 0.1 % nasal spray Place 2 sprays into both nostrils 2 (two) times daily. Use in each nostril as directed 12/02/15 12/01/16  Forcucci, Courtney, PA-C  fluticasone (FLONASE) 50 MCG/ACT nasal spray Place 2 sprays into both nostrils daily. 05/26/16   Forcucci, Courtney, PA-C  gabapentin (NEURONTIN) 100 MG capsule TAKE (1) CAPSULE THREE TIMES DAILY. 08/19/16   Quentin Mulling, PA-C    Allergies:   Influenza vaccines, Other, Penicillamine, Penicillins, Sulfamethoxazole, and Sulfonamide derivatives   Social History   Socioeconomic History   Marital status: Single    Spouse name: Not on file   Number of children: Not on file   Years of education: Not on file   Highest education level: Not on file  Occupational History   Not on file  Tobacco Use  Smoking status: Never   Smokeless tobacco: Never  Vaping Use   Vaping Use: Never used  Substance and Sexual Activity   Alcohol use: No    Comment: social   Drug use: No   Sexual activity: Yes    Partners: Male    Birth control/protection: Surgical  Other Topics Concern   Not on file  Social History Narrative   Not on file   Social Determinants of Health   Financial Resource Strain: Not on file  Food Insecurity: Not on file  Transportation Needs: Not on file  Physical Activity: Not on file  Stress: Not on file  Social Connections: Not on file     Family History:  The  patient's family history includes Arthritis in an other family member; Breast cancer in an other family member; Breast cancer (age of onset: 47) in her daughter; Breast cancer (age of onset: 46) in her daughter; Breast cancer (age of onset: 18) in her mother; Colitis in her daughter; Hyperlipidemia in an other family member; Lung cancer in her mother and another family member; Other in her father; Prostate cancer in an other family member.   ROS:   Please see the history of present illness.    ROS All other systems reviewed and are negative.   PHYSICAL EXAM:   VS:  BP 98/66 (BP Location: Right Arm, Patient Position: Sitting, Cuff Size: Normal)   Pulse 75   Ht 5\' 6"  (1.676 m)   Wt 119 lb 6.4 oz (54.2 kg)   SpO2 96%   BMI 19.27 kg/m    Affect appropriate Healthy:  appears stated age HEENT: normal Neck supple with no adenopathy JVP normal no bruits no thyromegaly Lungs clear with no wheezing and good diaphragmatic motion Heart:  S1/S2 AS  murmur, no rub, gallop or click PMI normal Abdomen: benighn, BS positve, no tenderness, no AAA no bruit.  No HSM or HJR Distal pulses intact with no bruits No edema Neuro non-focal Skin warm and dry No muscular weakness   Wt Readings from Last 3 Encounters:  10/23/22 119 lb 6.4 oz (54.2 kg)  09/15/22 124 lb 6.4 oz (56.4 kg)  09/08/22 120 lb 12.8 oz (54.8 kg)      Studies/Labs Reviewed:   EKG:   06/27/19 SR Rate 60 ICRBBB low voltage 10/23/2022 SR rate 74 normal   Recent Labs: 09/08/2022: ALT 21; BUN 14; Creat 0.86; Magnesium 1.7; Potassium 4.4; Sodium 136; TSH 1.14 09/15/2022: Hemoglobin 10.7; Platelets 169   Lipid Panel    Component Value Date/Time   CHOL 163 09/08/2022 1605   TRIG 68 09/08/2022 1605   HDL 76 09/08/2022 1605   CHOLHDL 2.1 09/08/2022 1605   VLDL 24 09/09/2016 1017   LDLCALC 73 09/08/2022 1605   LDLDIRECT 73.3 04/30/2011 0954      ASSESSMENT & PLAN:    1. Moderate aortic stenosis -  some progression but not  surgical with compensated LV    asymptomatic  - TTE ordered   2. Hypertension - Stable and well controlled without any medical regimen.  3. Hyperlipidemia - She is not on any medication. Recent lipid profile was normal.  4. COVID:  Infection September 2021 received monoclonal infusion  And first 2 vaccines will wait on booster normal lung exam  5. Thyroid:  TSH with primary may not be cause of weight loss concern her TSH may go up to clinical hypothyroid level  6. CAD: calcium in LAD on PET/CT normal EF and MBFR low risk  for obstructive CAD 12/16/21     Charlton Haws

## 2022-10-13 NOTE — Telephone Encounter (Signed)
-----   Message from Wendall Stade, MD sent at 10/13/2022  2:49 PM EDT ----- Seeing July 5 should have echo before then for f/u moderate AS

## 2022-10-20 ENCOUNTER — Encounter (HOSPITAL_COMMUNITY): Payer: Self-pay | Admitting: Cardiovascular Disease

## 2022-10-23 ENCOUNTER — Ambulatory Visit: Payer: PPO | Attending: Cardiovascular Disease | Admitting: Cardiovascular Disease

## 2022-10-23 VITALS — BP 98/66 | HR 75 | Ht 66.0 in | Wt 119.4 lb

## 2022-10-23 DIAGNOSIS — I350 Nonrheumatic aortic (valve) stenosis: Secondary | ICD-10-CM

## 2022-10-23 NOTE — Patient Instructions (Addendum)
Medication Instructions:  Your physician recommends that you continue on your current medications as directed. Please refer to the Current Medication list given to you today.  *If you need a refill on your cardiac medications before your next appointment, please call your pharmacy*  Lab Work: If you have labs (blood work) drawn today and your tests are completely normal, you will receive your results only by: MyChart Message (if you have MyChart) OR A paper copy in the mail If you have any lab test that is abnormal or we need to change your treatment, we will call you to review the results.  Testing/Procedures: Your physician has requested that you have an echocardiogram. Echocardiography is a painless test that uses sound waves to create images of your heart. It provides your doctor with information about the size and shape of your heart and how well your heart's chambers and valves are working. This procedure takes approximately one hour. There are no restrictions for this procedure. Please do NOT wear cologne, perfume, aftershave, or lotions (deodorant is allowed). Please arrive 15 minutes prior to your appointment time.   Follow-Up: At [facility], you and your health needs are our priority.  As part of our continuing mission to provide you with exceptional heart care, we have created designated Provider Care Teams.  These Care Teams include your primary Cardiologist (physician) and Advanced Practice Providers (APPs -  Physician Assistants and Nurse Practitioners) who all work together to provide you with the care you need, when you need it.  We recommend signing up for the patient portal called "MyChart".  Sign up information is provided on this After Visit Summary.  MyChart is used to connect with patients for Virtual Visits (Telemedicine).  Patients are able to view lab/test results, encounter notes, upcoming appointments, etc.  Non-urgent messages can be sent to your provider as  well.   To learn more about what you can do with MyChart, go to [email or web address].    Your next appointment:   1 year(s)  Provider:   Peter Nishan, MD     

## 2022-11-05 ENCOUNTER — Other Ambulatory Visit: Payer: Self-pay | Admitting: Internal Medicine

## 2022-11-09 ENCOUNTER — Telehealth: Payer: Self-pay | Admitting: Nurse Practitioner

## 2022-11-09 NOTE — Telephone Encounter (Signed)
Pt went to pharm to try to get refill on Clindamyn. She takes it before she goes to the dentist, however it did not have anymore refills. If you could refill and send to [employer] on file pls.

## 2022-11-10 ENCOUNTER — Telehealth: Payer: Self-pay | Admitting: Nurse Practitioner

## 2022-11-10 NOTE — Telephone Encounter (Signed)
Pharm called again, and I told them that you said most offices provide it... Pharm told me her dentist office does not. So could you send in [place].

## 2022-11-11 ENCOUNTER — Encounter: Payer: Self-pay | Admitting: Nurse Practitioner

## 2022-11-11 ENCOUNTER — Other Ambulatory Visit: Payer: Self-pay | Admitting: Internal Medicine

## 2022-11-11 DIAGNOSIS — E039 Hypothyroidism, unspecified: Secondary | ICD-10-CM

## 2022-11-12 ENCOUNTER — Other Ambulatory Visit: Payer: Self-pay | Admitting: Nurse Practitioner

## 2022-11-12 DIAGNOSIS — Z1231 Encounter for screening mammogram for malignant neoplasm of breast: Secondary | ICD-10-CM

## 2022-11-12 NOTE — Telephone Encounter (Signed)
LVM to call back.

## 2022-11-13 ENCOUNTER — Other Ambulatory Visit: Payer: Self-pay | Admitting: Nurse Practitioner

## 2022-11-13 MED ORDER — CLINDAMYCIN HCL 150 MG PO CAPS: 150.0000 mg | ORAL_CAPSULE | Freq: Three times a day (TID) | ORAL | 0 refills | Status: DC

## 2022-11-16 ENCOUNTER — Ambulatory Visit (INDEPENDENT_AMBULATORY_CARE_PROVIDER_SITE_OTHER): Payer: PPO

## 2022-11-16 DIAGNOSIS — E039 Hypothyroidism, unspecified: Secondary | ICD-10-CM | POA: Diagnosis not present

## 2022-11-16 NOTE — Progress Notes (Signed)
The patient comes in today for TSH lab work. She reports that she is taking no thyroid medication nor biotin or any biotin containing products. She has no further complaints or questions today.

## 2022-11-17 ENCOUNTER — Ambulatory Visit (HOSPITAL_COMMUNITY): Payer: PPO | Attending: Cardiovascular Disease

## 2022-11-17 DIAGNOSIS — I350 Nonrheumatic aortic (valve) stenosis: Secondary | ICD-10-CM | POA: Diagnosis not present

## 2022-11-17 LAB — ECHOCARDIOGRAM COMPLETE
AR max vel: 0.49 cm2
AV Area VTI: 0.56 cm2
AV Area mean vel: 0.46 cm2
AV Mean grad: 30 mmHg
AV Peak grad: 48.7 mmHg
Ao pk vel: 3.49 m/s
Area-P 1/2: 1.95 cm2
P 1/2 time: 481 msec
S' Lateral: 2.9 cm

## 2022-11-17 NOTE — Progress Notes (Signed)
^<^<^<^<^<^<^<^<^<^<^<^<^<^<^<^<^<^<^<^<^<^<^<^<^<^<^<^<^<^<^<^<^<^<^<^<^ ^>^>^>^>^>^>^>^>^>^>^>>^>^>^>^>^>^>^>^>^>^>^>^>^>^>^>^>^>^>^>^>^>^>^>^>^>  -   Thyroid is Normal - if off  thyroid meds now, then please stay off meds for now   ^<^<^<^<^<^<^<^<^<^<^<^<^<^<^<^<^<^<^<^<^<^<^<^<^<^<^<^<^<^<^<^<^<^<^<^<^ ^>^>^>^>^>^>^>^>^>^>^>^>^>^>^>^>^>^>^>^>^>^>^>^>^>^>^>^>^>^>^>^>^>^>^>^>^

## 2022-12-14 NOTE — Progress Notes (Unsigned)
3 MONTH FOLLOW UP   Assessment:   Essential hypertension At goal without medications at this time Monitor blood pressure at home; call if consistently over 130/80 Continue DASH diet.   Reminder to go to the ER if any CP, SOB, nausea, dizziness, severe HA, changes vision/speech, left arm numbness and tingling and jaw pain. - CBC - CMP  Mixed hyperlipidemia Continue low cholesterol diet , Rosuvastatin 5 mg and exercise.  Check lipid panel.   BMI 22.0-22.9, adult Continue to recommend diet heavy in fruits and veggies and low in animal meats, cheeses, and dairy products, appropriate calorie intake Discuss exercise recommendations routinely Continue to monitor weight at each visit  Aortic stenosis, moderate Followed by Dr. Eden Emms  Abnormal Glucose Continue diet and exercise - A1c  History of breast cancer Completed right lumpectomy 2001, chemo , radiation and 5 years of Arimidex   Hypothyroidism Taking levothyroxine 88 mcg daily Reminder to take on an empty stomach 30-68mins before first meal of the day. No antacid medications for 4 hours. - TSH  Depression/anxiety Doing well with wellbutrin, continue Xanax 0.5 mg rare use   Medication management -CBC, CMP , TSH, Lipid      Over 30 minutes of face to face interview, exam, counseling, chart review and critical decision making was performed  Future Appointments  Date Time Provider Department Center  02/01/2023  8:30 AM GI-[facility] MM 2 GI-BCGMM GI-BREAST CE  05/12/2023  8:00 AM [facility] DX DEXA 1 GI-BCGDG GI-BREAST CE  09/08/2023  3:00 PM Raynelle Dick, NP [facility] None      Subjective:  Kathryn Chavez is a 72 y.o. female who presents for complete physical and follow up has Essential hypertension; Allergic rhinitis; Vitamin D deficient osteomalacia; Abnormal ECG; Hyperlipidemia; Aortic stenosis, moderate; Female cystocele; Onychomycosis of great toe; Osteopenia; Hypothyroidism; Protein-calorie malnutrition (HCC);  Corns and callus; Seborrheic keratosis; Neoplasm, uncertain whether benign or malignant; Neoplasm, benign; Hemangioma; and Dermatofibroma on their problem list.    Currently not on thyroid  Lab Results  Component Value Date   TSH 3.10 11/16/2022    She also has significant hx/o R lumpectomy in 2001 treated with Chemo radiation and followed with 5 years of Arimidex.   She is followed by Dr. Eden Emms for moderate aortic stenosis.. CT cardiac perfusion revealed :  No acute noncardiac finding in the chest.  Aortic Atherosclerosis (ICD10-I70.0).  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     She is doing very well on the wellbutrin, she very rarely takes xanax. Last fill Alprazolam 0.5 mg # 30 0 refills on 06/02/22.  BMI is Body mass index is 20.21 kg/m., she has been working on diet and exercise.  Wt Readings from Last 3 Encounters:  12/15/22 125 lb 3.2 oz (56.8 kg)  11/16/22 120 lb (54.4 kg)  10/23/22 119 lb 6.4 oz (54.2 kg)    Her blood pressure has been controlled at home, today their BP is BP: 108/68   BP Readings from Last 3 Encounters:  12/15/22 108/68  10/23/22 98/66  09/15/22 114/72  She does workout. She denies chest pain, shortness of breath, dizziness.    She is on cholesterol medication, increased to 1 tablet of the crestor 5mg  and denies myalgias. Her cholesterol is at goal. The cholesterol last visit was:   Lab Results  Component Value Date   CHOL 163 09/08/2022   HDL 76 09/08/2022  LDLCALC 73 09/08/2022   LDLDIRECT 73.3 04/30/2011   TRIG 68 09/08/2022   CHOLHDL 2.1 09/08/2022    Last A1C in the office was:  Lab Results  Component Value Date   HGBA1C 5.4 09/08/2022   She is trying to drink more water. Last GFR: Lab Results  Component Value Date   EGFR 71 09/08/2022    Patient is on Vitamin D supplement and at goal at recent check:    Lab Results  Component  Value Date   VD25OH 92 09/08/2022      Medication Review: Current Outpatient Medications on File Prior to Visit  Medication Sig Dispense Refill   ALPRAZolam (XANAX) 0.5 MG tablet TAKE 1/2 - 1 TABLET UP TO TWICE DAILY AS NEEDED FOR ANXIETY, SLEEP (LIMIT TO LESS THAN 5 DAYS A WEEK) 30 tablet 0   buPROPion (WELLBUTRIN XL) 300 MG 24 hr tablet TAKE ONE TABLET BY MOUTH EVERY MORNING FOR MOOD, ANXIETY, FOCUS AND CONCENTRATION 90 tablet 3   chlorhexidine (PERIDEX) 0.12 % solution 15 mLs 2 (two) times daily.     Cholecalciferol (VITAMIN D) 125 MCG (5000 UT) CAPS Take 5,000 Units by mouth daily.     estradiol (ESTRACE) 0.1 MG/GM vaginal cream Place vaginally.     gabapentin (NEURONTIN) 100 MG capsule TAKE ONE CAPSULE THREE TIMES DAILY. 270 capsule 3   ipratropium (ATROVENT) 0.03 % nasal spray Place 2 sprays into the nose 3 (three) times daily. 30 mL 2   rosuvastatin (CRESTOR) 5 MG tablet TAKE ONE TABLET ONCE DAILY FOR CHOLESTEROL 90 tablet 1   traZODone (DESYREL) 50 MG tablet 1/2-1 tablet for sleep 30 tablet 2   clindamycin (CLEOCIN) 150 MG capsule Take 1 capsule (150 mg total) by mouth 3 (three) times daily. For dental appts 3 capsule 0   meloxicam (MOBIC) 7.5 MG tablet Take 7.5 mg by mouth 2 (two) times daily. PRN (Patient not taking: Reported on 12/15/2022)     No current facility-administered medications on file prior to visit.    Allergies  Allergen Reactions   Influenza Vaccines Nausea And Vomiting and Other (See Comments)    dizziness   Other Nausea And Vomiting and Other (See Comments)    dizziness   Penicillamine Hives   Penicillins     REACTION: Hives   Sulfamethoxazole Hives   Sulfonamide Derivatives     REACTION: Hives    Current Problems (verified) Patient Active Problem List   Diagnosis Date Noted   Protein-calorie malnutrition (HCC) 10/30/2020   Hypothyroidism 10/02/2019   Osteopenia 11/04/2017   Onychomycosis of great toe 09/08/2017   Aortic stenosis, moderate  09/09/2016   Hemangioma 06/23/2016   Dermatofibroma 12/24/2015   Hyperlipidemia 01/16/2015   Corns and callus 06/26/2014   Seborrheic keratosis 06/26/2014   Neoplasm, benign 06/26/2014   Abnormal ECG 01/23/2014   Neoplasm, uncertain whether benign or malignant 07/19/2013   Female cystocele 02/09/2011   Vitamin D deficient osteomalacia 10/28/2010   Essential hypertension 12/16/2009   Allergic rhinitis 12/16/2009    Screening Tests Immunization History  Administered Date(s) Administered   Influenza Split 02/26/2011, 03/11/2012   Influenza, High Dose Seasonal PF 01/29/2016   Influenza,inj,Quad PF,6+ Mos 05/02/2013   PFIZER(Purple Top)SARS-COV-2 Vaccination 06/12/2019, 07/03/2019   Pneumococcal Conjugate-13 08/05/2017   Pneumococcal Polysaccharide-23 01/16/2015   Tdap 10/28/2010   Health Maintenance  Topic Date Due   Zoster Vaccines- Shingrix (1 of 2) Never done   COVID-19 Vaccine (3 - Pfizer risk series) 07/31/2019   Medicare Annual Wellness (AWV)  06/03/2023   MAMMOGRAM  01/28/2024   Colonoscopy  11/21/2031   Pneumonia Vaccine 45+ Years old  Completed   DEXA SCAN  Completed   Hepatitis C Screening  Completed   HPV VACCINES  Aged Out   DTaP/Tdap/Td  Discontinued   INFLUENZA VACCINE  Discontinued     Names of Other Physician/Practitioners you currently use: 1. [facility] here for primary care 2. Dr. [place], eye doctor, 2022 3. Dr. Marland Kitchen , dentist, last visit 2018 due for follow up   Patient Care Team: Lucky Cowboy, MD as PCP - General (Internal Medicine) Wendall Stade, MD as PCP - Cardiology (Cardiology) [place], Heather, OD (Optometry)  SURGICAL HISTORY She  has a past surgical history that includes Abdominal hysterectomy; Cystocele repair (N/A, 05/12/2018); Breast lumpectomy (Right, 2000); Esophagogastroduodenoscopy (egd) with propofol (N/A, 11/20/2021); Colonoscopy with propofol (N/A, 11/20/2021); biopsy (11/20/2021); and polypectomy  (11/20/2021). FAMILY HISTORY Her family history includes Arthritis in an other family member; Breast cancer in an other family member; Breast cancer (age of onset: 73) in her daughter; Breast cancer (age of onset: 68) in her daughter; Breast cancer (age of onset: 82) in her mother; Colitis in her daughter; Hyperlipidemia in an other family member; Lung cancer in her mother and another family member; Other in her father; Prostate cancer in an other family member. SOCIAL HISTORY She  reports that she has never smoked. She has never used smokeless tobacco. She reports that she does not drink alcohol and does not use drugs.   Review of Systems  Constitutional:  Positive for malaise/fatigue and weight loss.  HENT:  Negative for hearing loss and tinnitus.   Eyes:  Negative for blurred vision and double vision.  Respiratory:  Negative for cough, sputum production, shortness of breath and wheezing.   Cardiovascular:  Negative for chest pain, palpitations, orthopnea, claudication, leg swelling and PND.  Gastrointestinal:  Positive for diarrhea. Negative for abdominal pain, blood in stool, constipation, heartburn, melena, nausea and vomiting.       Small protrusion under sternal notch,  Genitourinary: Negative.   Musculoskeletal:  Negative for falls, joint pain and myalgias.  Skin:  Negative for rash.  Neurological:  Negative for dizziness, tingling, sensory change, weakness and headaches.  Endo/Heme/Allergies:  Negative for polydipsia.  Psychiatric/Behavioral: Negative.  Negative for depression, memory loss, substance abuse and suicidal ideas. The patient is not nervous/anxious and does not have insomnia.   All other systems reviewed and are negative.    Objective:     Today's Vitals   12/15/22 1555  BP: 108/68  Pulse: 68  Temp: (!) 97.3 F (36.3 C)  SpO2: 98%  Weight: 125 lb 3.2 oz (56.8 kg)  Height: 5\' 6"  (1.676 m)    Body mass index is 20.21 kg/m.  General appearance: thin female in  no distress  HEENT: normocephalic, sclerae anicteric, TMs pearly, nares patent, no discharge or erythema, pharynx normal Oral cavity: MMM, no lesions Neck: supple, no lymphadenopathy, no thyromegaly, no masses Heart: RRR, normal S1, S2, systolic murmur radiation to the carotids Lungs: CTA bilaterally, no wheezes, rhonchi, or rales Abdomen: +bs, soft, soft mildly tender mass palpated under sternum in epigastric area Musculoskeletal: nontender, no swelling, no obvious deformity Extremities: no edema, no cyanosis, no clubbing Pulses: 2+ symmetric, upper and lower extremities, normal cap refill Neurological: alert, oriented x 3, CN2-12 intact, strength normal upper extremities and lower extremities, sensation normal throughout, DTRs 2+ throughout, no cerebellar signs, gait normal Psychiatric: normal affect, behavior  normal, pleasant  Pelvic/GU: defer to GYN EKG:NSR, IRBBB, PAC, no ST changes AAA: < 3 cm   [facility]  12/15/2022

## 2022-12-15 ENCOUNTER — Ambulatory Visit (INDEPENDENT_AMBULATORY_CARE_PROVIDER_SITE_OTHER): Payer: PPO | Admitting: Nurse Practitioner

## 2022-12-15 ENCOUNTER — Encounter: Payer: Self-pay | Admitting: Nurse Practitioner

## 2022-12-15 VITALS — BP 108/68 | HR 68 | Temp 97.3°F | Ht 66.0 in | Wt 125.2 lb

## 2022-12-15 DIAGNOSIS — Z853 Personal history of malignant neoplasm of breast: Secondary | ICD-10-CM | POA: Diagnosis not present

## 2022-12-15 DIAGNOSIS — Z6821 Body mass index (BMI) 21.0-21.9, adult: Secondary | ICD-10-CM | POA: Diagnosis not present

## 2022-12-15 DIAGNOSIS — I350 Nonrheumatic aortic (valve) stenosis: Secondary | ICD-10-CM

## 2022-12-15 DIAGNOSIS — E039 Hypothyroidism, unspecified: Secondary | ICD-10-CM

## 2022-12-15 DIAGNOSIS — R7309 Other abnormal glucose: Secondary | ICD-10-CM | POA: Diagnosis not present

## 2022-12-15 DIAGNOSIS — Z79899 Other long term (current) drug therapy: Secondary | ICD-10-CM

## 2022-12-15 DIAGNOSIS — E782 Mixed hyperlipidemia: Secondary | ICD-10-CM

## 2022-12-15 DIAGNOSIS — F418 Other specified anxiety disorders: Secondary | ICD-10-CM | POA: Diagnosis not present

## 2022-12-15 DIAGNOSIS — I10 Essential (primary) hypertension: Secondary | ICD-10-CM

## 2022-12-15 NOTE — Patient Instructions (Signed)

## 2022-12-16 LAB — COMPLETE METABOLIC PANEL WITH GFR
AG Ratio: 2.6 (calc) — ABNORMAL HIGH (ref 1.0–2.5)
ALT: 22 U/L (ref 6–29)
AST: 24 U/L (ref 10–35)
Albumin: 4.4 g/dL (ref 3.6–5.1)
Alkaline phosphatase (APISO): 96 U/L (ref 37–153)
BUN: 21 mg/dL (ref 7–25)
CO2: 23 mmol/L (ref 20–32)
Calcium: 9.4 mg/dL (ref 8.6–10.4)
Chloride: 99 mmol/L (ref 98–110)
Creat: 0.92 mg/dL (ref 0.60–1.00)
Globulin: 1.7 g/dL (calc) — ABNORMAL LOW (ref 1.9–3.7)
Glucose, Bld: 79 mg/dL (ref 65–99)
Potassium: 4.1 mmol/L (ref 3.5–5.3)
Sodium: 135 mmol/L (ref 135–146)
Total Bilirubin: 0.4 mg/dL (ref 0.2–1.2)
Total Protein: 6.1 g/dL (ref 6.1–8.1)
eGFR: 66 mL/min/{1.73_m2} (ref >=60)

## 2022-12-16 LAB — CBC WITH DIFFERENTIAL/PLATELET
Absolute Monocytes: 624 {cells}/uL (ref 200–950)
Basophils Absolute: 40 {cells}/uL (ref 0–200)
Basophils Relative: 0.5 %
Eosinophils Absolute: 80 {cells}/uL (ref 15–500)
Eosinophils Relative: 1 %
HCT: 34.9 % — ABNORMAL LOW (ref 35.0–45.0)
Hemoglobin: 11.7 g/dL (ref 11.7–15.5)
Lymphs Abs: 3240 {cells}/uL (ref 850–3900)
MCH: 31.3 pg (ref 27.0–33.0)
MCHC: 33.5 g/dL (ref 32.0–36.0)
MCV: 93.3 fL (ref 80.0–100.0)
MPV: 9.5 fL (ref 7.5–12.5)
Monocytes Relative: 7.8 %
Neutro Abs: 4016 cells/uL (ref 1500–7800)
Neutrophils Relative %: 50.2 %
Platelets: 144 10*3/uL (ref 140–400)
RBC: 3.74 10*6/uL — ABNORMAL LOW (ref 3.80–5.10)
RDW: 12.1 % (ref 11.0–15.0)
Total Lymphocyte: 40.5 %
WBC: 8 10*3/uL (ref 3.8–10.8)

## 2022-12-16 LAB — LIPID PANEL
Cholesterol: 207 mg/dL — ABNORMAL HIGH (ref <200)
HDL: 110 mg/dL (ref >=50)
LDL Cholesterol (Calc): 82 mg/dL
Non-HDL Cholesterol (Calc): 97 mg/dL (ref <130)
Total CHOL/HDL Ratio: 1.9 (calc) (ref <5.0)
Triglycerides: 71 mg/dL (ref <150)

## 2022-12-16 LAB — TSH: TSH: 1.79 m[IU]/L (ref 0.40–4.50)

## 2023-01-04 ENCOUNTER — Other Ambulatory Visit: Payer: Self-pay | Admitting: Nurse Practitioner

## 2023-01-04 DIAGNOSIS — F418 Other specified anxiety disorders: Secondary | ICD-10-CM

## 2023-01-05 DIAGNOSIS — C44729 Squamous cell carcinoma of skin of left lower limb, including hip: Secondary | ICD-10-CM | POA: Diagnosis not present

## 2023-01-05 DIAGNOSIS — Z85828 Personal history of other malignant neoplasm of skin: Secondary | ICD-10-CM | POA: Diagnosis not present

## 2023-01-05 DIAGNOSIS — L570 Actinic keratosis: Secondary | ICD-10-CM | POA: Diagnosis not present

## 2023-01-05 DIAGNOSIS — D485 Neoplasm of uncertain behavior of skin: Secondary | ICD-10-CM | POA: Diagnosis not present

## 2023-01-05 DIAGNOSIS — C44722 Squamous cell carcinoma of skin of right lower limb, including hip: Secondary | ICD-10-CM | POA: Diagnosis not present

## 2023-01-06 ENCOUNTER — Encounter: Payer: Self-pay | Admitting: Cardiovascular Disease

## 2023-02-01 ENCOUNTER — Ambulatory Visit
Admission: RE | Admit: 2023-02-01 | Discharge: 2023-02-01 | Disposition: A | Discharge status: Home / Self Care | Payer: PPO | Source: Ambulatory Visit | Attending: Nurse Practitioner | Admitting: Nurse Practitioner

## 2023-02-01 DIAGNOSIS — Z1231 Encounter for screening mammogram for malignant neoplasm of breast: Secondary | ICD-10-CM | POA: Diagnosis not present

## 2023-02-16 ENCOUNTER — Telehealth: Payer: Self-pay

## 2023-02-16 DIAGNOSIS — I350 Nonrheumatic aortic (valve) stenosis: Secondary | ICD-10-CM

## 2023-02-16 NOTE — Telephone Encounter (Signed)
Placed order for echo to have in July 2025

## 2023-02-16 NOTE — Telephone Encounter (Signed)
-----   Message from Charlton Haws sent at 11/17/2022  5:15 PM EDT ----- Moderate AS still TTE in a year

## 2023-03-11 DIAGNOSIS — L570 Actinic keratosis: Secondary | ICD-10-CM | POA: Diagnosis not present

## 2023-03-11 DIAGNOSIS — L905 Scar conditions and fibrosis of skin: Secondary | ICD-10-CM | POA: Diagnosis not present

## 2023-03-11 DIAGNOSIS — Z85828 Personal history of other malignant neoplasm of skin: Secondary | ICD-10-CM | POA: Diagnosis not present

## 2023-03-11 DIAGNOSIS — L821 Other seborrheic keratosis: Secondary | ICD-10-CM | POA: Diagnosis not present

## 2023-03-21 ENCOUNTER — Other Ambulatory Visit: Payer: Self-pay | Admitting: Nurse Practitioner

## 2023-03-21 DIAGNOSIS — F418 Other specified anxiety disorders: Secondary | ICD-10-CM

## 2023-05-12 ENCOUNTER — Ambulatory Visit
Admission: RE | Admit: 2023-05-12 | Discharge: 2023-05-12 | Disposition: A | Discharge status: Home / Self Care | Payer: PPO | Source: Ambulatory Visit | Attending: Nurse Practitioner | Admitting: Nurse Practitioner

## 2023-05-12 DIAGNOSIS — M85852 Other specified disorders of bone density and structure, left thigh: Secondary | ICD-10-CM

## 2023-05-14 ENCOUNTER — Other Ambulatory Visit: Payer: Self-pay | Admitting: Nurse Practitioner

## 2023-05-19 ENCOUNTER — Ambulatory Visit: Payer: PPO | Admitting: Nurse Practitioner

## 2023-05-21 ENCOUNTER — Ambulatory Visit: Payer: PPO | Admitting: Nurse Practitioner

## 2023-05-23 NOTE — Progress Notes (Signed)
 Kathryn Chavez is a 73 y.o.  with  reports that she has never smoked. She has never used smokeless tobacco. with  Active Ambulatory Problems    Diagnosis Date Noted  . Cystocele 02/09/2011  . Essential hypertension 12/16/2009  . Aortic stenosis, moderate 09/09/2016  . Hyperlipidemia 01/16/2015  . Osteopenia 11/04/2017  . Vitamin D  deficient osteomalacia 10/28/2010   Resolved Ambulatory Problems    Diagnosis Date Noted  . No Resolved Ambulatory Problems   Past Medical History:  Diagnosis Date  . CA - breast cancer (CMD)   . Heart murmur    who presents today at Urgent Care for  Chief Complaint  Patient presents with  . Medication Reaction    To Doxycycline ; Started on Thursday after being seen here and dx with Flu A & Bronchitis. Broke out yesterday (Day 3). States she doesn't feel better at all and wants to know if there is anything else she can have re: antibiotics. Last dose of doxycycline  this AM. Rash is itchy.       History of Present Illness The patient is a 73 year old female who presents with a rash that developed on the third day of her antibiotic course. She was prescribed doxycycline  after being diagnosed with influenza A and bronchitis on Thursday. Despite the treatment, she reports no improvement in her condition and is seeking an alternative antibiotic.  She reports experiencing fatigue and a persistent cough since the previous Sunday or Monday. The cough has been disrupting her sleep due to its severity, although it is non-productive. She underwent a radiographic examination last Thursday but not today. She expresses concern about the potential risk of pneumonia.  She developed a rash on the third day of her antibiotic course.  ALLERGIES She is allergic to SULFA MEDICATIONS and PENICILLINS.  MEDICATIONS Current: doxycycline       Patient has no co-morbidities  or medications that might increase the risk for developing a severe infection     reports  that she has never smoked. She has never used smokeless tobacco.  Review of Systems  Review of systems is otherwise negative except as noted in the HPI and Assessment/MDM   Physical Exam  BP 108/66 (BP Location: Right arm, Patient Position: Sitting)   Pulse 78   Temp 98.8 F (37.1 C) (Oral)   Resp 18   Ht 1.676 m (5' 6)   Wt 54.4 kg (120 lb)   SpO2 98%   BMI 19.37 kg/m   Constitutional:      General: Patient is not in acute distress.    Appearance: Normal appearance.  Neurological:     General: No focal deficit present.     Mental Status: alert and oriented to person, place, and time.  HENT:     Eyes:     Conjunctiva/sclera: Conjunctivae normal. Pharynx     There is no  associated anterior cervical lymphadenopathy    Pupils: Pupils are equal, round, and reactive to light.     TM's normal with no visible effusion  Cardiovascular:     Heart sounds: Normal heart sounds. No murmur heard.    No Lower extremity edema noted Pulmonary:     Effort: Pulmonary effort is normal. No respiratory distress.     Breath sounds: No wheezing, rhonchi or rales.  Abdominal:     General: Bowel sounds are normal. There is no distension.     Tenderness: There is no abdominal tenderness.  Musculoskeletal:        General: Normal  range of motion.     Neck:  No rigidity.  Skin:    General: Skin is warm and dry.  Psychiatric:        Mood and Affect: Mood normal.   No orders to display    DIAGNOSIS/PLAN     1. Acute bacterial bronchitis  methylPREDNISolone  sodium succinate (SOLU-Medrol ) injection 125 mg   azithromycin  (ZITHROMAX ) 250 mg tablet   brompheniramine-pseudoePHEDrine-DM (BROMFED DM) 2-30-10 mg/5 mL syrp syrup      Assessment & Plan 1. Influenza A. She was previously diagnosed with Influenza A and has not shown improvement. Azithromycin  will be prescribed as an alternative antibiotic. Probiotics will be provided to help reset gut flora and prevent stomach upset. A  prescription for Bromfed will be issued to alleviate her cough.  2. Bronchitis. She was diagnosed with bronchitis and has not shown improvement. Her chest x-ray from the previous visit was unremarkable, and a repeat x-ray is not deemed necessary at this time. The possibility of atypical pneumonia was discussed, and it was explained that this condition typically does not progress to full-blown pneumonia. Azithromycin  will be prescribed as an alternative antibiotic. Probiotics will be provided to help reset gut flora and prevent stomach upset. A prescription for Bromfed will be issued to alleviate her cough.  3. Rash. She developed a rash on the third day of taking doxycycline . A prescription for azithromycin  will be sent to [facility]. A steroid injection will be administered today to address the rash.    We discussed risks and side effects of medications, and also discussed red flags which would warrant immediate follow-up.   Urgent Care Disposition:  Home Care

## 2023-05-25 ENCOUNTER — Encounter: Payer: Self-pay | Admitting: Nurse Practitioner

## 2023-05-26 ENCOUNTER — Ambulatory Visit: Payer: PPO | Admitting: Nurse Practitioner

## 2023-05-27 ENCOUNTER — Ambulatory Visit: Payer: PPO | Admitting: Internal Medicine

## 2023-06-15 ENCOUNTER — Other Ambulatory Visit: Payer: Self-pay

## 2023-06-15 DIAGNOSIS — G4709 Other insomnia: Secondary | ICD-10-CM

## 2023-06-15 MED ORDER — TRAZODONE HCL 50 MG PO TABS: ORAL_TABLET | ORAL | 2 refills | Status: DC

## 2023-06-21 ENCOUNTER — Other Ambulatory Visit: Payer: Self-pay

## 2023-06-21 DIAGNOSIS — J309 Allergic rhinitis, unspecified: Secondary | ICD-10-CM

## 2023-06-21 MED ORDER — IPRATROPIUM BROMIDE 0.03 % NA SOLN: 2.0000 | Freq: Three times a day (TID) | NASAL | 2 refills | Status: AC

## 2023-09-01 ENCOUNTER — Encounter (HOSPITAL_COMMUNITY): Payer: Self-pay | Admitting: Cardiovascular Disease

## 2023-09-08 ENCOUNTER — Encounter: Payer: PPO | Admitting: Nurse Practitioner

## 2023-10-29 ENCOUNTER — Ambulatory Visit (HOSPITAL_COMMUNITY)
Admission: RE | Admit: 2023-10-29 | Discharge: 2023-10-29 | Disposition: A | Discharge status: Home / Self Care | Source: Ambulatory Visit | Attending: Internal Medicine | Admitting: Internal Medicine

## 2023-10-29 ENCOUNTER — Ambulatory Visit: Payer: Self-pay | Admitting: Cardiovascular Disease

## 2023-10-29 ENCOUNTER — Other Ambulatory Visit (HOSPITAL_COMMUNITY): Payer: Self-pay | Admitting: Cardiovascular Disease

## 2023-10-29 DIAGNOSIS — Z8679 Personal history of other diseases of the circulatory system: Secondary | ICD-10-CM | POA: Insufficient documentation

## 2023-10-29 DIAGNOSIS — I350 Nonrheumatic aortic (valve) stenosis: Secondary | ICD-10-CM

## 2023-10-29 DIAGNOSIS — I10 Essential (primary) hypertension: Secondary | ICD-10-CM | POA: Diagnosis not present

## 2023-10-29 DIAGNOSIS — I352 Nonrheumatic aortic (valve) stenosis with insufficiency: Secondary | ICD-10-CM | POA: Insufficient documentation

## 2023-10-29 DIAGNOSIS — E785 Hyperlipidemia, unspecified: Secondary | ICD-10-CM | POA: Diagnosis not present

## 2023-10-29 LAB — ECHOCARDIOGRAM COMPLETE
AR max vel: 0.96 cm2
AV Area VTI: 0.86 cm2
AV Area mean vel: 0.85 cm2
AV Mean grad: 33 mmHg
AV Peak grad: 48.3 mmHg
Ao pk vel: 3.48 m/s
Area-P 1/2: 3.27 cm2
P 1/2 time: 642 ms
S' Lateral: 3.1 cm

## 2023-11-15 ENCOUNTER — Encounter: Payer: Self-pay | Admitting: Cardiovascular Disease

## 2023-11-21 NOTE — Progress Notes (Unsigned)
 Cardiology Office Note    Date:  11/22/2023   ID:  Kathryn Chavez, DOB 1950/12/10, MRN [id number]  PCP:  Tonita Fallow, MD  Cardiologist:  Dr. Delford   Chief Complaint: Aortic Stenosis   History of Present Illness:   Kathryn Chavez is a 73 y.o. female with hx of HTN, HLD, breast cancer and aortic stenosis Her BP and lipids are diet controlled   Works at [employer]   Reviewed TTE done 02/22/18 EF 60-65% moderate AS mean gradient 19 peak 34 mmHg And now moderate AR  Reviewed TTE done 05/15/20 EF 60-65% moderate AS mean grdient 20 mmhg peak 35 mmHg DVI 0.28 AVA 1.1 cm2 and moderate AR  Reviewed TTE done 08/29/21  EF 60-65% moderate AS mean gradient 26.7 mmHg peak 45.3 mmHg DVI 0.32 AVA 0.9 cm2 and mild/moderate AR  Reviewed TTE done 11/17/22  EF 55-60% mean gradient 30 peak 48.7 mmhg DVI 0.19 AVA 0.6 cm2 Reviewed TTE done 10/29/23 EF 60-65% mean gradient 33 mmHg peak 48.3 mmHg AVA 0.96 cm2 DVI 0.27 Moderate to severe AS   No chest pain, dyspnea, palpitations or syncope  Enjoys a glass of wine every night   Living at [facility] since December 2022  PET/CT 12/16/21 poor quality study Normal MVFR 2.45 Rest EF 62% but stress 50% Severe calcium  in LAD small anterolateral defect reversible with normal wall motion Low risk  Has lost 10-15 lbs ? IBS Primary stopped her synthroid  but her TSH was normal Seen at urgent care 11/11/23 for cough/sputum Rx with medrol  dose pack no antibiotics She had a tooth pulled last year and goes to dentist 2x/year  Discussed progression of her valve She is still asymptomatic except for some dyspnea going up stairs. Discussed doing TAVR CTA and likely need for intervention in next year or 2 at most   Past Medical History:  Diagnosis Date   Allergy    rhinitis   Aortic stenosis, moderate    on echo in 2015.   Cancer [facility])    breast   Hyperlipidemia    Hypertension    Personal history of chemotherapy 2000   Personal history of radiation therapy  2000    Past Surgical History:  Procedure Laterality Date   ABDOMINAL HYSTERECTOMY     BIOPSY  11/20/2021   Procedure: BIOPSY;  Surgeon: Legrand Victory LITTIE DOUGLAS, MD;  Location: [facility];  Service: Gastroenterology;;   BREAST LUMPECTOMY Right 2000   malignant   COLONOSCOPY WITH PROPOFOL  N/A 11/20/2021   Procedure: COLONOSCOPY WITH PROPOFOL ;  Surgeon: Legrand Victory LITTIE DOUGLAS, MD;  Location: [facility];  Service: Gastroenterology;  Laterality: N/A;   CYSTOCELE REPAIR N/A 05/12/2018   ESOPHAGOGASTRODUODENOSCOPY (EGD) WITH PROPOFOL  N/A 11/20/2021   Procedure: ESOPHAGOGASTRODUODENOSCOPY (EGD) WITH PROPOFOL ;  Surgeon: Legrand Victory LITTIE DOUGLAS, MD;  Location: [facility];  Service: Gastroenterology;  Laterality: N/A;   POLYPECTOMY  11/20/2021   Procedure: POLYPECTOMY;  Surgeon: Legrand Victory LITTIE DOUGLAS, MD;  Location: [facility];  Service: Gastroenterology;;    Current Medications: Prior to Admission medications   Medication Sig Start Date End Date Taking? Authorizing Provider  ALPRAZolam  (XANAX  XR) 0.5 MG 24 hr tablet Take 1 tablet (0.5 mg total) by mouth daily. 08/20/16   Craig Palma, PA-C  azelastine  (ASTELIN ) 0.1 % nasal spray Place 2 sprays into both nostrils 2 (two) times daily. Use in each nostril as directed 12/02/15 12/01/16  Forcucci, Charmaine, PA-C  fluticasone  (FLONASE ) 50 MCG/ACT nasal spray Place 2 sprays into both  nostrils daily. 05/26/16   Forcucci, Courtney, PA-C  gabapentin  (NEURONTIN ) 100 MG capsule TAKE (1) CAPSULE THREE TIMES DAILY. 08/19/16   Craig Palma, PA-C    Allergies:   Influenza vaccines, Other, Penicillamine, Penicillins, Sulfamethoxazole, and Sulfonamide derivatives   Social History   Socioeconomic History   Marital status: Single    Spouse name: Not on file   Number of children: Not on file   Years of education: Not on file   Highest education level: Not on file  Occupational History   Not on file  Tobacco Use   Smoking status: Never   Smokeless tobacco: Never  Vaping  Use   Vaping status: Never Used  Substance and Sexual Activity   Alcohol use: No    Comment: social   Drug use: No   Sexual activity: Yes    Partners: Male    Birth control/protection: Surgical  Other Topics Concern   Not on file  Social History Narrative   Not on file   Social Drivers of Health   Financial Resource Strain: Not on file  Food Insecurity: Not on file  Transportation Needs: Not on file  Physical Activity: Not on file  Stress: Not on file  Social Connections: Not on file     Family History:  The patient's family history includes Arthritis in an other family member; Breast cancer in an other family member; Breast cancer (age of onset: 76) in her daughter; Breast cancer (age of onset: 43) in her daughter; Breast cancer (age of onset: 16) in her mother; Colitis in her daughter; Hyperlipidemia in an other family member; Lung cancer in her mother and another family member; Other in her father; Prostate cancer in an other family member.   ROS:   Please see the history of present illness.    ROS All other systems reviewed and are negative.   PHYSICAL EXAM:   VS:  BP 122/82 (BP Location: Left Arm, Patient Position: Sitting)   Pulse 80   Ht 5' 5 (1.651 m)   Wt 124 lb (56.2 kg)   SpO2 97%   BMI 20.63 kg/m    Affect appropriate Healthy:  appears stated age HEENT: normal Neck supple with no adenopathy JVP normal no bruits no thyromegaly Lungs clear with no wheezing and good diaphragmatic motion Heart:  S1/S2 AS  murmur, no rub, gallop or click PMI normal Abdomen: benighn, BS positve, no tenderness, no AAA no bruit.  No HSM or HJR Distal pulses intact with no bruits No edema Neuro non-focal Skin warm and dry No muscular weakness   Wt Readings from Last 3 Encounters:  11/22/23 124 lb (56.2 kg)  12/15/22 125 lb 3.2 oz (56.8 kg)  11/16/22 120 lb (54.4 kg)      Studies/Labs Reviewed:   EKG:   06/27/19 SR Rate 60 ICRBBB low voltage 11/22/2023 SR rate 74 normal    Recent Labs: 12/15/2022: ALT 22; BUN 21; Creat 0.92; Hemoglobin 11.7; Platelets 144; Potassium 4.1; Sodium 135; TSH 1.79   Lipid Panel    Component Value Date/Time   CHOL 207 (H) 12/15/2022 1644   TRIG 71 12/15/2022 1644   HDL 110 12/15/2022 1644   CHOLHDL 1.9 12/15/2022 1644   VLDL 24 09/09/2016 1017   LDLCALC 82 12/15/2022 1644   LDLDIRECT 73.3 04/30/2011 0954      ASSESSMENT & PLAN:    1. Aortic Stenosis - some progression over the last few years. Mod/Severe on TTE 10/29/23 with AVA 0.86 cm2 by VTI, DVI  0.27 and mean gradient 33 peak 48.3 mmHg Valve is severely calcified. Shared decision making favor cardiac CTA with TAVR protocol to assess AV calcium  score, annular area and valve morphology Will then make any decisions regarding need for intervention.  She is asymptomatic except for dyspnea going up stairs   2. Hypertension - Stable and well controlled without any medical regimen.  3. Hyperlipidemia - She is not on any medication. Recent lipid profile was normal.  4. COVID:  Infection September 2021 received monoclonal infusion  And first 2 vaccines will wait on booster normal lung exam  5. Thyroid :  TSH with primary may not be cause of weight loss concern her TSH may go up to clinical hypothyroid level  6. CAD: calcium  in LAD on PET/CT normal EF and MBFR low risk for obstructive CAD 12/16/21   BNP BMET Cardiac TAVR CTA  F/U after testing     Maude Emmer

## 2023-11-22 ENCOUNTER — Other Ambulatory Visit: Payer: Self-pay

## 2023-11-22 ENCOUNTER — Ambulatory Visit: Attending: Cardiovascular Disease | Admitting: Cardiovascular Disease

## 2023-11-22 VITALS — BP 122/82 | HR 80 | Ht 65.0 in | Wt 124.0 lb

## 2023-11-22 DIAGNOSIS — I350 Nonrheumatic aortic (valve) stenosis: Secondary | ICD-10-CM

## 2023-11-22 NOTE — Patient Instructions (Addendum)
 Medication Instructions:  Your physician recommends that you continue on your current medications as directed. Please refer to the Current Medication list given to you today.  *If you need a refill on your cardiac medications before your next appointment, please call your pharmacy*  Lab Work: Your physician recommends that you have lab work today. BNP and BMET  If you have labs (blood work) drawn today and your tests are completely normal, you will receive your results only by: MyChart Message (if you have MyChart) OR A paper copy in the mail If you have any lab test that is abnormal or we need to change your treatment, we will call you to review the results.  Testing/Procedures: Cardiac CT scanning for TAVR, (CAT scanning), is a noninvasive, special x-ray that produces cross-sectional images of the body using x-rays and a computer. CT scans help physicians diagnose and treat medical conditions. For some CT exams, a contrast material is used to enhance visibility in the area of the body being studied. CT scans provide greater clarity and reveal more details than regular x-ray exams.   Follow-Up: At [facility], you and your health needs are our priority.  As part of our continuing mission to provide you with exceptional heart care, our providers are all part of one team.  This team includes your primary Cardiologist (physician) and Advanced Practice Providers or APPs (Physician Assistants and Nurse Practitioners) who all work together to provide you with the care you need, when you need it.  Your next appointment:   2 month(s)  Provider:   Maude Emmer, MD    We recommend signing up for the patient portal called MyChart.  Sign up information is provided on this After Visit Summary.  MyChart is used to connect with patients for Virtual Visits (Telemedicine).  Patients are able to view lab/test results, encounter notes, upcoming appointments, etc.  Non-urgent messages can be sent  to your provider as well.   To learn more about what you can do with MyChart, go to [email or web address].     Your cardiac CT will be scheduled at one of the below locations:   [facility] [address] [place], [place] [address] [phone]  OR   [facility] [address] [place], [place] [address] [phone]  OR   [facility] [address] [place], [place] [address] [phone]  OR   [facility] [address]  [place], [place] [address]  OR   [facility] [address] [place], [place]  If scheduled at [facility], please arrive at the [facility] Entrance (Entrance C2) of [facility] 30 minutes prior to test start time. You can use the FREE valet parking offered at entrance C (encouraged to control the heart rate for the test)  Proceed to the [facility] Radiology Department (first floor) to check-in and test prep.  All radiology patients and guests should use entrance C2 at [facility], accessed from [facility], even though the hospital's physical address listed is [address]  If scheduled at the Heart and Vascular Tower at [address], please enter the parking lot using the [address] entrance and use the FREE valet service at the patient drop-off area. Enter the building and check-in with registration on the main floor.  If scheduled at [facility], please arrive to the Heart and Vascular Center 15 mins early for  check-in and test prep.  There is spacious parking and easy access to the radiology department from the [facility] Heart and Vascular entrance. Please enter here and check-in with the desk attendant.   If scheduled at [facility], please arrive 30 minutes early for check-in and test prep.  Please follow these instructions carefully (unless otherwise  directed):  An IV will be required for this test and Nitroglycerin  will be given.  Hold all erectile dysfunction medications at least 3 days (72 hrs) prior to test. (Ie viagra, cialis, sildenafil, tadalafil, etc)   On the Night Before the Test: Be sure to Drink plenty of water. Do not consume any caffeinated/decaffeinated beverages or chocolate 12 hours prior to your test. Do not take any antihistamines 12 hours prior to your test.  On the Day of the Test: Drink plenty of water until 1 hour prior to the test. Do not eat any food 1 hour prior to test. You may take your regular medications prior to the test.   After the Test: Drink plenty of water. After receiving IV contrast, you may experience a mild flushed feeling. This is normal. On occasion, you may experience a mild rash up to 24 hours after the test. This is not dangerous. If this occurs, you can take Benadryl  25 mg, Zyrtec, Claritin, or Allegra  and increase your fluid intake. (Patients taking Tikosyn should avoid Benadryl , and may take Zyrtec, Claritin, or Allegra ) If you experience trouble breathing, this can be serious. If it is severe call 911 IMMEDIATELY. If it is mild, please call our office.  We will call to schedule your test 2-4 weeks out understanding that some insurance companies will need an authorization prior to the service being performed.   For more information and frequently asked questions, please visit our website : [email or web address]  For non-scheduling related questions, please contact the cardiac imaging nurse navigator should you have any questions/concerns: Cardiac Imaging Nurse Navigators Direct Office Dial: [phone]   For scheduling needs, including cancellations and rescheduling, please call [place], [phone]. Medication

## 2023-11-23 ENCOUNTER — Other Ambulatory Visit: Payer: Self-pay

## 2023-11-23 ENCOUNTER — Ambulatory Visit: Payer: Self-pay | Admitting: Cardiovascular Disease

## 2023-11-23 ENCOUNTER — Telehealth: Payer: Self-pay

## 2023-11-23 DIAGNOSIS — I350 Nonrheumatic aortic (valve) stenosis: Secondary | ICD-10-CM

## 2023-11-23 DIAGNOSIS — D696 Thrombocytopenia, unspecified: Secondary | ICD-10-CM

## 2023-11-23 DIAGNOSIS — R161 Splenomegaly, not elsewhere classified: Secondary | ICD-10-CM

## 2023-11-23 LAB — PRO B NATRIURETIC PEPTIDE: NT-Pro BNP: 694 pg/mL — ABNORMAL HIGH (ref 0–301)

## 2023-11-23 LAB — BASIC METABOLIC PANEL WITH GFR
BUN/Creatinine Ratio: 18 (ref 12–28)
BUN: 17 mg/dL (ref 8–27)
CO2: 23 mmol/L (ref 20–29)
Calcium: 10.4 mg/dL — ABNORMAL HIGH (ref 8.7–10.3)
Chloride: 103 mmol/L (ref 96–106)
Creatinine, Ser: 0.92 mg/dL (ref 0.57–1.00)
Glucose: 87 mg/dL (ref 70–99)
Potassium: 4.7 mmol/L (ref 3.5–5.2)
Sodium: 144 mmol/L (ref 134–144)
eGFR: 65 mL/min/1.73 (ref >59)

## 2023-11-23 MED ORDER — METOPROLOL TARTRATE 50 MG PO TABS
ORAL_TABLET | ORAL | 0 refills | Status: DC
Start: 2023-11-23 — End: 2024-03-02

## 2023-11-23 MED ORDER — FUROSEMIDE 20 MG PO TABS: 10.0000 mg | ORAL_TABLET | Freq: Every day | ORAL | 3 refills | Status: DC

## 2023-11-23 NOTE — Telephone Encounter (Signed)
 Per 8/4 labs.   Kathryn JAYSON Emmer, MD 11/23/2023  8:26 AM EDT Back to Top    Kidneys fine. BNP up start lasix  10 mg daily after CTA for TAVR done and repeat BMET/BNP 4 weeks after starting lasix     I spoke with the patient and arranged TAVR CT scans for Friday, 8/8.  I will send the pt instructions for CT through My Chart.  I also made the pt aware of lab results.  The pt will plan to start Furosemide  on Saturday, 8/9. She will be due for lab work around 9/8. Orders placed and released for labs.

## 2023-11-26 ENCOUNTER — Ambulatory Visit (HOSPITAL_COMMUNITY)
Admission: RE | Admit: 2023-11-26 | Discharge: 2023-11-26 | Disposition: A | Discharge status: Home / Self Care | Source: Ambulatory Visit | Attending: Internal Medicine | Admitting: Internal Medicine

## 2023-11-26 DIAGNOSIS — I517 Cardiomegaly: Secondary | ICD-10-CM | POA: Diagnosis not present

## 2023-11-26 DIAGNOSIS — I350 Nonrheumatic aortic (valve) stenosis: Secondary | ICD-10-CM | POA: Insufficient documentation

## 2023-11-26 MED ORDER — IOHEXOL 350 MG/ML SOLN
100.0000 mL | Freq: Once | INTRAVENOUS | Status: AC | PRN
  Administered 2023-11-26: 100 mL via INTRAVENOUS

## 2023-12-09 ENCOUNTER — Other Ambulatory Visit: Payer: Self-pay | Admitting: Nurse Practitioner

## 2023-12-09 ENCOUNTER — Encounter: Payer: Self-pay | Admitting: Cardiovascular Disease

## 2023-12-09 ENCOUNTER — Ambulatory Visit: Attending: Cardiovascular Disease | Admitting: Cardiovascular Disease

## 2023-12-09 VITALS — BP 100/80 | HR 76 | Ht 65.0 in | Wt 122.6 lb

## 2023-12-09 DIAGNOSIS — I350 Nonrheumatic aortic (valve) stenosis: Secondary | ICD-10-CM

## 2023-12-09 DIAGNOSIS — R161 Splenomegaly, not elsewhere classified: Secondary | ICD-10-CM

## 2023-12-09 DIAGNOSIS — Z01812 Encounter for preprocedural laboratory examination: Secondary | ICD-10-CM | POA: Diagnosis not present

## 2023-12-09 NOTE — Patient Instructions (Addendum)
 Medication Instructions:  No medication changes were made at this visit. Continue current regimen.   *If you need a refill on your cardiac medications before your next appointment, please call your pharmacy*  Lab Work: To be completed today: BMP and CBC  If you have labs (blood work) drawn today and your tests are completely normal, you will receive your results only by: MyChart Message (if you have MyChart) OR A paper copy in the mail If you have any lab test that is abnormal or we need to change your treatment, we will call you to review the results.  Testing/Procedures: None ordered today.   Other Instructions You have been referred to our TAVR surgery team. Someone will be in contact with you to schedule this appointment.         Cardiac/Peripheral Catheterization   You are scheduled for a Cardiac Catheterization on Thursday, August 28 with Dr. Ozell Fell.  1. Please arrive at the [facility] (Main Entrance A) at [facility]: [address] [place], [place] [address] at 9:00 AM (This time is 2 hour(s) before your procedure to ensure your preparation). Your procedure is scheduled to begin at 11 AM.  Free valet parking service is available. You will check in at ADMITTING. The support person will be asked to wait in the waiting room.  It is OK to have someone drop you off and come back when you are ready to be discharged.        Special note: Every effort is made to have your procedure done on time. Please understand that emergencies sometimes delay scheduled procedures.  2. Diet: No solid foods after midnight. You may have clear liquids until you arrive at the hospital.  List of approved liquids water, clear juice, clear tea, black coffee, fruit juices, non-citric and without pulp, carbonated beverages, Gatorade, Kool -Aid, plain Jello-O and plain ice popsicles.  3. Hydration:You need to be well hydrated before your procedure. On August 28, you may drink approved  liquids (see below) until 2 hours before the procedure, with 16 oz of water as your last intake.   List of approved liquids water, clear juice, clear tea, black coffee, fruit juices, non-citric and without pulp, carbonated beverages, Gatorade, Kool -Aid, plain Jello-O and plain ice popsicles.  4. Labs: You will need to have blood drawn on Thursday, August 21 at  [facility] (1st Floor), [address], [place], [place] [address]. You do not need to be fasting.  5. Medication instructions in preparation for your procedure:   Contrast Allergy: No   Stop taking, Mobic (Meloxicam) Wednesday, August 27,   On the morning of your procedure, take Aspirin 81 mg and any morning medicines NOT listed above.  You may use sips of water.  6. Plan to go home the same day, you will only stay overnight if medically necessary. 7. You MUST have a responsible adult to drive you home. 8. An adult MUST be with you the first 24 hours after you arrive home. 9. Bring a current list of your medications, and the last time and date medication taken. 10. Bring ID and current insurance cards. 11.Please wear clothes that are easy to get on and off and wear slip-on shoes.  Thank you for allowing us  to care for you!   -- [facility]

## 2023-12-09 NOTE — H&P (View-Only) (Signed)
 [facility]   MULTIDISCIPLINARY HEART VALVE TEAM  Date:  12/09/2023   ID:  KARISHA MARLIN, DOB 1950-12-26, MRN [id number]  PCP:  Laurice President, NP   Chief Complaint  Patient presents with   Aortic Stenosis     HISTORY OF PRESENT ILLNESS: Kathryn Chavez is a 73 y.o. female who presents for evaluation of aortic stenosis, referred by Dr Delford.  The patient has been followed for aortic stenosis now for several years. Since 2019, her LVEF has remained normal at 60-65% and her mean aortic valve gradient has increased from 19 mmHg to on her recent echo in July 2025. On this echo, the DI is 0.27 and AVA is 0.96. On TAVR CT, her valve is functionally bicuspid with calcium  score 1624, and annular area 430 square mm.   She was first told of having a heart murmur approximately 10 years ago. She has no hx of rheumatic fever. She has been healthy with no major medical problems. She admits to chest tightness at times but has been more symptomatic since finding out about her aortic stenosis. She also complains of exertional dyspnea but states that this has been present for several years. She is short of breath with stairs or walking up an incline. She has no limitation with walking on flat ground. No orthopnea, PND, or palpitations. She feels there is slow worsening of her shortness of breath and fatigue.   The patient is here with her husband today. 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  She has one daughter and has lost one daughter. The patient has had regular dental care and reports no recent problems. She is seen every 6 months.   Past Medical History:  Diagnosis Date   Allergy    rhinitis   Aortic stenosis, moderate    on echo in 2015.   Cancer [facility])    breast   Hyperlipidemia    Hypertension    Personal history of chemotherapy 2000   Personal history of radiation therapy 2000    Current Outpatient Medications  Medication Sig Dispense Refill    ALPRAZolam  (XANAX ) 0.5 MG tablet TAKE 1/2 - 1 TABLET ONCE A DAY FOR ANXIETY, SLEEP (LIMIT TO LESS THAN 5 DAYS A WEEK) 25 tablet 0   buPROPion  (WELLBUTRIN  XL) 300 MG 24 hr tablet TAKE ONE TABLET BY MOUTH EVERY MORNING FOR MOOD, ANXIETY, FOCUS AND CONCENTRATION 90 tablet 3   Cholecalciferol (VITAMIN D ) 125 MCG (5000 UT) CAPS Take 5,000 Units by mouth daily.     furosemide  (LASIX ) 20 MG tablet Take 0.5 tablets (10 mg total) by mouth daily. 45 tablet 3   gabapentin  (NEURONTIN ) 100 MG capsule TAKE ONE CAPSULE THREE TIMES DAILY. 270 capsule 3   ipratropium (ATROVENT ) 0.03 % nasal spray Place 2 sprays into the nose 3 (three) times daily. 30 mL 2   metoprolol  tartrate (LOPRESSOR ) 50 MG tablet Take one tablet by mouth as directed prior to CT scan. 1 tablet 0   rosuvastatin  (CRESTOR ) 5 MG tablet TAKE ONE TABLET ONCE DAILY FOR CHOLESTEROL 90 tablet 1   traZODone  (DESYREL ) 50 MG tablet 1/2-1 tablet for sleep 30 tablet 2   chlorhexidine (PERIDEX) 0.12 % solution 15 mLs 2 (two) times daily.     estradiol  (ESTRACE ) 0.1 MG/GM vaginal cream Place vaginally.     meloxicam (MOBIC) 7.5 MG tablet Take 7.5 mg by mouth 2 (two) times daily. PRN (Patient not taking: Reported on 12/15/2022)  No current facility-administered medications for this visit.    ALLERGIES:   Influenza vaccines, Other, Penicillamine, Penicillins, Sulfamethoxazole, and Sulfonamide derivatives   SOCIAL HISTORY:  The patient  reports that she has never smoked. She has never used smokeless tobacco. She reports that she does not drink alcohol and does not use drugs.   FAMILY HISTORY:  The patient's family history includes Arthritis in an other family member; Breast cancer in an other family member; Breast cancer (age of onset: 56) in her daughter; Breast cancer (age of onset: 38) in her daughter; Breast cancer (age of onset: 53) in her mother; Colitis in her daughter; Hyperlipidemia in an other family member; Lung cancer in her mother and another  family member; Other in her father; Prostate cancer in an other family member.   REVIEW OF SYSTEMS:  Negative except as outlined in the HPI.   All other systems are reviewed and negative.   PHYSICAL EXAM: VS:  BP 100/80 (BP Location: Left Arm, Patient Position: Sitting)   Pulse 76   Ht 5' 5 (1.651 m)   Wt 122 lb 9.6 oz (55.6 kg)   SpO2 99%   BMI 20.40 kg/m  , BMI Body mass index is 20.4 kg/m. GEN: Well nourished, well developed, in no acute distress HEENT: normal Neck: No JVD. carotids 2+ without bruits or masses Cardiac: The heart is RRR with a 3/6 harsh late peaking systolic murmur at the RUSB, A2 present but diminished Respiratory:  clear to auscultation bilaterally GI: soft, nontender, nondistended, + BS MS: no deformity or atrophy Skin: warm and dry, no rash Neuro:  Strength and sensation are intact Psych: euthymic mood, full affect  EKG:  EKG from 11/22/2023 reviewed and demonstrates NSR 80 bpm, incomplete RBBB  RECENT LABS: 12/15/2022: ALT 22; Hemoglobin 11.7; Platelets 144; TSH 1.79 11/22/2023: BUN 17; Creatinine, Ser 0.92; NT-Pro BNP 694; Potassium 4.7; Sodium 144  12/15/2022: Cholesterol 207; HDL 110; LDL Cholesterol (Calc) 82; Total CHOL/HDL Ratio 1.9; Triglycerides 71   Estimated Creatinine Clearance: 47.1 mL/min (by C-G formula based on SCr of 0.92 mg/dL).   Wt Readings from Last 3 Encounters:  12/09/23 122 lb 9.6 oz (55.6 kg)  11/22/23 124 lb (56.2 kg)  12/15/22 125 lb 3.2 oz (56.8 kg)     CARDIAC STUDIES: Cardiac Studies & Procedures   ______________________________________________________________________________________________   STRESS TESTS  NM PET CT CARDIAC PERFUSION MULTI W/ABSOLUTE BLOODFLOW 12/16/2021  Narrative   LV perfusion is abnormal. There is evidence of ischemia. There is no evidence of infarction. Defect 1: There is a small defect with mild reduction in uptake present in the mid anterolateral location(s) that is reversible. There is normal  wall motion in the defect area. Consistent with artifact.   Sub-optimal CT attenuation-misregistration.  Sub optimal stress input curves may be normal depending on Rubium Generator cycle of Rubifil.   Myocardial blood flow was computed to be 0.20ml/g/min at rest and 2.33ml/g/min at stress. Global myocardial blood flow reserve was 2.45 and was normal.   Rest left ventricular function is normal. Rest EF: 62 %. Stress left ventricular function is normal. Stress EF: 50 %. End diastolic cavity size is normal. End systolic cavity size is normal.   Normal LVEF Reserve 12%   Coronary calcium  was present on the attenuation correction CT images. Severe coronary calcifications were present. Coronary calcifications were present in the left anterior descending artery distribution(s).   Given normal MBFR, suspect the study is normal. The study is low risk.   Read  by Stanly Leavens MD FASE  CLINICAL DATA:  This over-read does not include interpretation of cardiac or coronary anatomy or pathology. The Cardiac PET CT interpretation by the cardiologist is attached.  COMPARISON:  None Available.  FINDINGS: Vascular: Atherosclerotic calcifications of the aorta. No acute noncardiac vascular finding.  Mediastinum/Nodes: Within the visualized portions of the chest there are no pathologically enlarged mediastinal, hilar or axillary lymph nodes within the limitation of noncontrast enhanced examination. The distal esophagus is grossly unremarkable.  Lungs/Pleura: Within the visualized portions of the chest there are no suspicious pulmonary nodules or masses no focal airspace consolidation, and no pleural effusion or pneumothorax on this motion degraded examination.  Upper Abdomen: No acute abnormality.  Musculoskeletal: No acute osseous abnormality. Multilevel degenerative changes spine.  IMPRESSION: No acute noncardiac finding in the chest.  Aortic Atherosclerosis (ICD10-I70.0).   Electronically  Signed By: Reyes Holder M.D. On: 12/16/2021 16:32   ECHOCARDIOGRAM  ECHOCARDIOGRAM COMPLETE 10/29/2023  Narrative ECHOCARDIOGRAM REPORT    Patient Name:   AIMI ESSNER Date of Exam: 10/29/2023 Medical Rec #:  [id number]      Height:       66.0 in Accession #:    [id number]     Weight:       125.2 lb Date of Birth:  June 14, 1950      BSA:          1.639 m Patient Age:    73 years       BP:           108/68 mmHg Patient Gender: F              HR:           76 bpm. Exam Location:  [address]  Procedure: 2D Echo, Color Doppler, Cardiac Doppler and 3D Echo (Both Spectral and Color Flow Doppler were utilized during procedure).  Indications:    Aortic stenosis I35.0  History:        Patient has prior history of Echocardiogram examinations, most recent 11/17/2022. Risk Factors:Hypertension and Dyslipidemia.  Sonographer:    Augustin Seals RDCS Referring Phys: [id number] MAUDE JAYSON EMMER  IMPRESSIONS   1. Left ventricular ejection fraction, by estimation, is 60 to 65%. The left ventricle has normal function. The left ventricle has no regional wall motion abnormalities. Left ventricular diastolic parameters were normal. 2. Right ventricular systolic function is normal. The right ventricular size is normal. There is normal pulmonary artery systolic pressure. 3. Aneurysmal fossa with likely PFO. cannot exclude a small PFO. 4. The mitral valve is abnormal. Trivial mitral valve regurgitation. No evidence of mitral stenosis. 5. Compared to TTE done 11/17/22 mean gradient 30-> 33 mmHg peak 48.7-> 48.3 mmHg DVI improved 0.19-> 0.27 and AVA improved 0.96 cm2 . The aortic valve is tricuspid. There is severe calcifcation of the aortic valve. Aortic valve regurgitation is mild. Moderate to severe aortic valve stenosis. 6. The inferior vena cava is normal in size with greater than 50% respiratory variability, suggesting right atrial pressure of 3 mmHg.  FINDINGS Left Ventricle: Left ventricular  ejection fraction, by estimation, is 60 to 65%. The left ventricle has normal function. The left ventricle has no regional wall motion abnormalities. Strain was performed and the global longitudinal strain is indeterminate. The left ventricular internal cavity size was normal in size. There is no left ventricular hypertrophy. Left ventricular diastolic parameters were normal.  Right Ventricle: The right ventricular size is normal. No increase in right ventricular wall thickness. Right  ventricular systolic function is normal. There is normal pulmonary artery systolic pressure. The tricuspid regurgitant velocity is 2.26 m/s, and with an assumed right atrial pressure of 3 mmHg, the estimated right ventricular systolic pressure is 23.4 mmHg.  Left Atrium: Left atrial size was normal in size.  Right Atrium: Right atrial size was normal in size.  Pericardium: There is no evidence of pericardial effusion.  Mitral Valve: The mitral valve is abnormal. There is mild thickening of the mitral valve leaflet(s). There is mild calcification of the mitral valve leaflet(s). Trivial mitral valve regurgitation. No evidence of mitral valve stenosis.  Tricuspid Valve: The tricuspid valve is normal in structure. Tricuspid valve regurgitation is mild . No evidence of tricuspid stenosis.  Aortic Valve: Compared to TTE done 11/17/22 mean gradient 30-> 33 mmHg peak 48.7-> 48.3 mmHg DVI improved 0.19-> 0.27 and AVA improved 0.96 cm2. The aortic valve is tricuspid. There is severe calcifcation of the aortic valve. Aortic valve regurgitation is mild. Aortic regurgitation PHT measures 642 msec. Moderate to severe aortic stenosis is present. Aortic valve mean gradient measures 33.0 mmHg. Aortic valve peak gradient measures 48.3 mmHg. Aortic valve area, by VTI measures 0.86 cm.  Pulmonic Valve: The pulmonic valve was normal in structure. Pulmonic valve regurgitation is not visualized. No evidence of pulmonic stenosis.  Aorta:  The aortic root is normal in size and structure.  Venous: The inferior vena cava is normal in size with greater than 50% respiratory variability, suggesting right atrial pressure of 3 mmHg.  IAS/Shunts: Cannot exclude a small PFO.  Additional Comments: 3D was performed not requiring image post processing on an independent workstation and was indeterminate.   LEFT VENTRICLE PLAX 2D LVIDd:         4.20 cm   Diastology LVIDs:         3.10 cm   LV e' medial:    6.09 cm/s LV PW:         0.90 cm   LV E/e' medial:  12.0 LV IVS:        0.70 cm   LV e' lateral:   7.72 cm/s LVOT diam:     2.00 cm   LV E/e' lateral: 9.5 LV SV:         76 LV SV Index:   46 LVOT Area:     3.14 cm  3D Volume EF: 3D EF:        50 % LV EDV:       109 ml LV ESV:       54 ml LV SV:        55 ml  RIGHT VENTRICLE            IVC RV Basal diam:  3.20 cm    IVC diam: 1.40 cm RV Mid diam:    2.20 cm RV S prime:     9.25 cm/s TAPSE (M-mode): 2.0 cm  LEFT ATRIUM           Index        RIGHT ATRIUM           Index LA diam:      2.70 cm 1.65 cm/m   RA Area:     13.30 cm LA Vol (A2C): 35.5 ml 21.66 ml/m  RA Volume:   32.80 ml  20.01 ml/m LA Vol (A4C): 28.9 ml 17.63 ml/m AORTIC VALVE AV Area (Vmax):    0.96 cm AV Area (Vmean):   0.85 cm AV Area (VTI):  0.86 cm AV Vmax:           347.67 cm/s AV Vmean:          274.000 cm/s AV VTI:            0.885 m AV Peak Grad:      48.3 mmHg AV Mean Grad:      33.0 mmHg LVOT Vmax:         106.00 cm/s LVOT Vmean:        74.400 cm/s LVOT VTI:          0.241 m LVOT/AV VTI ratio: 0.27 AI PHT:            642 msec  AORTA Ao Root diam: 3.40 cm Ao Asc diam:  3.50 cm  MITRAL VALVE                TRICUSPID VALVE MV Area (PHT): 3.27 cm     TR Peak grad:   20.4 mmHg MV Decel Time: 232 msec     TR Vmax:        226.00 cm/s MV E velocity: 73.20 cm/s MV A velocity: 112.00 cm/s  SHUNTS MV E/A ratio:  0.65         Systemic VTI:  0.24 m Systemic Diam: 2.00 cm  Maude Emmer MD Electronically signed by Maude Emmer MD Signature Date/Time: 10/29/2023/9:57:10 AM    Final      CT SCANS  CT CORONARY MORPH W/CTA COR W/SCORE 11/26/2023  Addendum 12/01/2023 11:34 AM ADDENDUM REPORT: 12/01/2023 11:32  EXAM: OVER-READ INTERPRETATION  CT CHEST  The following report is an over-read performed by radiologist Dr. Mabel Converse of [facility] on 12/01/2023. This over-read does not include interpretation of cardiac or coronary anatomy or pathology. The coronary CTA interpretation by the cardiologist is attached.  COMPARISON:  None.  FINDINGS: Normal heart size. No pericardial effusion. Image thoracic aorta is nonaneurysmal. Central pulmonary vasculature within normal limits.  No adenopathy within the included chest. Imaged lung fields are clear. No acute bony or chest wall abnormality.  IMPRESSION: No acute or significant extracardiac findings.   Electronically Signed By: Mabel Converse D.O. On: 12/01/2023 11:32  Narrative CLINICAL DATA:  66F with severe aortic stenosis being evaluated for a TAVR procedure.  EXAM: Cardiac TAVR CT  TECHNIQUE: A non-contrast, gated CT scan was obtained with axial slices of 2.5 mm through the heart for aortic valve scoring. A 120 kV retrospective, gated, contrast cardiac scan was obtained. Gantry rotation speed was 230 msec and collimation was 0.63 mm. Nitroglycerin  was not given. A delayed scan was obtained to exclude left atrial appendage thrombus. The 3D dataset was reconstructed in systole with motion correction. The 3D data set was reconstructed in 5% intervals of the 0-95% of the R-R cycle. Systolic and diastolic phases were analyzed on a dedicated workstation using MPR, MIP, and VRT modes. The patient received 100 cc of contrast.  FINDINGS: Aortic Root:  Aortic valve: Tricuspid, though functionally bicuspid with partial fusion of left and right cusps  Aortic valve calcium  score:  1624  Aortic annulus:  Diameter: 26mm x 24mm  Perimeter: 76mm  Area: 485mm^2  Calcifications: No calcifications  Coronary height: Min Left - 8mm; Min Right - 19mm  Sinotubular height: Left cusp -17mm ; Right cusp - 25mm; Noncoronary cusp - 24mm  LVOT (as measured 3 mm below the annulus):  Diameter: 25mm x 23mm  Area: 412mm^2  Calcifications: No calcifications  Aortic sinus width: Left cusp -  32mm; Right cusp - 28mm; Noncoronary cusp - 32mm  Sinotubular junction width: 26mm x 25mm  Optimum Fluoroscopic Angle for Delivery: RAO 2 CAU 42  Cardiac:  Right atrium: Mild enlargement  Right ventricle: Normal size  Pulmonary arteries: Normal size  Pulmonary veins: Normal configuration  Left atrium: Normal size.  Interatrial septal aneurysm.  Left ventricle: Normal size  Pericardium: Normal thickness  Coronary arteries: Normal origin.  Calcium  score 220  IMPRESSION: 1. Tricuspid aortic valve, though functionally bicuspid with partial fusion of left and right cusps. Severe aortic valve calcifications (AV calcium  score 1624)  2. Aortic annulus measures 26mm x 24mm in diameter with perimeter 76mm and area 460mm^2. No annular or LVOT calcifications. Annular measurements are right on the border between 23mm and 26mm Edwards Sapien 3 valve. Annular measurements would be appropriate for a 29mm Evolut valve, but sinus of Valsalva diameter at the right cusp (28mm) is below the recommended minimum (29mm) for a 29mm Evolut valve. Recommend discussion at structural heart conference.  3. Low coronary height to left main (8mm). Sufficient coronary height for RCA  4.  Optimum Fluoroscopic Angle for Delivery:  RAO 2 CAU 42  5.  Coronary calcium  score 220 (75th percentile)  Electronically Signed: By: Lonni Nanas M.D. On: 11/26/2023 14:16     ______________________________________________________________________________________________      STS Score    Procedure Type: Isolated AVR  Perioperative Outcome Estimate %  Operative Mortality 2.54%  Morbidity & Mortality 9.05%  Stroke 1.68%  Renal Failure 1.45%  Reoperation 4.81%  Prolonged Ventilation 4.34%  Deep Sternal Wound Infection 0.029%  Long Hospital Stay (>14 days) 4.28%  Short Hospital Stay (<6 days)* 44.8%   ASSESSMENT AND PLAN: 73 year old woman with severe, stage D1 aortic stenosis associated with NYHA functional class II symptoms of shortness of breath and fatigue.  I have personally reviewed her echo images, gated cardiac CTA, and CTA of the chest, abdomen, and pelvis.  Her echocardiogram shows normal LVEF of 60 to 65%, normal RV function, and severe calcification and restriction of the aortic valve with mild aortic insufficiency and moderately severe aortic stenosis with a peak transaortic velocity of 3.5 m/s, mean gradient 33 mmHg, dimensionless index 0.27, and calculated aortic valve area of 0.85 cm based on mean velocities, 0.86 cm based on VTI.  The patient's gated cardiac CTA shows an aortic valve calcium  score of 1624 consistent with severe aortic stenosis, and annular area 430 mm, a left coronary artery height of 8 mm, a right coronary height of 19 mm, and large sinuses with a left sinus measuring 32 mm and right sinus measuring 28 mm.  The aortic valve is tricuspid but functionally bicuspid with partial fusion of the left and right cusps.  I have reviewed the natural history of aortic stenosis with the patient and their family members who are present today. We have discussed the limitations of medical therapy and the poor prognosis associated with symptomatic aortic stenosis. We have reviewed potential treatment options, including palliative medical therapy, conventional surgical aortic valve replacement, and transcatheter aortic valve replacement. We discussed treatment options in the context of the patient's specific comorbid medical conditions.    I think it is  appropriate to pursue further evaluation for aortic valve replacement based on progressive aortic stenosis on serial echo studies, aortic valve calcium  score confirming severe aortic stenosis, and patient reporting progressive symptoms of shortness of breath and fatigue.  The patient will undergo a cardiac catheterization to evaluate for obstructive CAD. I have  reviewed the risks, indications, and alternatives to cardiac catheterization, possible angioplasty, and stenting with the patient. Risks include but are not limited to bleeding, infection, vascular injury, stroke, myocardial infection, arrhythmia, kidney injury, radiation-related injury in the case of prolonged fluoroscopy use, emergency cardiac surgery, and death. The patient understands the risks of serious complication is 1-2 in 1000 with diagnostic cardiac cath and 1-2% or less with angioplasty/stenting.  Once her cardiac catheterization is completed, she will be referred for formal cardiac surgical consultation as part of a multidisciplinary heart team approach to her care.  If she is ultimately felt to be an appropriate candidate for TAVR, I have reviewed her CT studies and she has transfemoral access with no significant plaque or stenosis of the iliofemoral arteries.  Her aortic annulus would be appropriate for a 23 mm Edwards SAPIEN 3 transcatheter heart valve.  Her left coronary height is low, but she has a large sinus and I think her risk of coronary obstruction would be very low.  Bonney Ozell Fell 12/09/2023 8:37 AM

## 2023-12-09 NOTE — Progress Notes (Signed)
 [facility]   MULTIDISCIPLINARY HEART VALVE TEAM  Date:  12/09/2023   ID:  KARISHA MARLIN, DOB 1950-12-26, MRN [id number]  PCP:  Laurice President, NP   Chief Complaint  Patient presents with   Aortic Stenosis     HISTORY OF PRESENT ILLNESS: Kathryn Chavez is a 73 y.o. female who presents for evaluation of aortic stenosis, referred by Dr Delford.  The patient has been followed for aortic stenosis now for several years. Since 2019, her LVEF has remained normal at 60-65% and her mean aortic valve gradient has increased from 19 mmHg to on her recent echo in July 2025. On this echo, the DI is 0.27 and AVA is 0.96. On TAVR CT, her valve is functionally bicuspid with calcium  score 1624, and annular area 430 square mm.   She was first told of having a heart murmur approximately 10 years ago. She has no hx of rheumatic fever. She has been healthy with no major medical problems. She admits to chest tightness at times but has been more symptomatic since finding out about her aortic stenosis. She also complains of exertional dyspnea but states that this has been present for several years. She is short of breath with stairs or walking up an incline. She has no limitation with walking on flat ground. No orthopnea, PND, or palpitations. She feels there is slow worsening of her shortness of breath and fatigue.   The patient is here with her husband today. 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  She has one daughter and has lost one daughter. The patient has had regular dental care and reports no recent problems. She is seen every 6 months.   Past Medical History:  Diagnosis Date   Allergy    rhinitis   Aortic stenosis, moderate    on echo in 2015.   Cancer [facility])    breast   Hyperlipidemia    Hypertension    Personal history of chemotherapy 2000   Personal history of radiation therapy 2000    Current Outpatient Medications  Medication Sig Dispense Refill    ALPRAZolam  (XANAX ) 0.5 MG tablet TAKE 1/2 - 1 TABLET ONCE A DAY FOR ANXIETY, SLEEP (LIMIT TO LESS THAN 5 DAYS A WEEK) 25 tablet 0   buPROPion  (WELLBUTRIN  XL) 300 MG 24 hr tablet TAKE ONE TABLET BY MOUTH EVERY MORNING FOR MOOD, ANXIETY, FOCUS AND CONCENTRATION 90 tablet 3   Cholecalciferol (VITAMIN D ) 125 MCG (5000 UT) CAPS Take 5,000 Units by mouth daily.     furosemide  (LASIX ) 20 MG tablet Take 0.5 tablets (10 mg total) by mouth daily. 45 tablet 3   gabapentin  (NEURONTIN ) 100 MG capsule TAKE ONE CAPSULE THREE TIMES DAILY. 270 capsule 3   ipratropium (ATROVENT ) 0.03 % nasal spray Place 2 sprays into the nose 3 (three) times daily. 30 mL 2   metoprolol  tartrate (LOPRESSOR ) 50 MG tablet Take one tablet by mouth as directed prior to CT scan. 1 tablet 0   rosuvastatin  (CRESTOR ) 5 MG tablet TAKE ONE TABLET ONCE DAILY FOR CHOLESTEROL 90 tablet 1   traZODone  (DESYREL ) 50 MG tablet 1/2-1 tablet for sleep 30 tablet 2   chlorhexidine (PERIDEX) 0.12 % solution 15 mLs 2 (two) times daily.     estradiol  (ESTRACE ) 0.1 MG/GM vaginal cream Place vaginally.     meloxicam (MOBIC) 7.5 MG tablet Take 7.5 mg by mouth 2 (two) times daily. PRN (Patient not taking: Reported on 12/15/2022)  No current facility-administered medications for this visit.    ALLERGIES:   Influenza vaccines, Other, Penicillamine, Penicillins, Sulfamethoxazole, and Sulfonamide derivatives   SOCIAL HISTORY:  The patient  reports that she has never smoked. She has never used smokeless tobacco. She reports that she does not drink alcohol and does not use drugs.   FAMILY HISTORY:  The patient's family history includes Arthritis in an other family member; Breast cancer in an other family member; Breast cancer (age of onset: 56) in her daughter; Breast cancer (age of onset: 38) in her daughter; Breast cancer (age of onset: 53) in her mother; Colitis in her daughter; Hyperlipidemia in an other family member; Lung cancer in her mother and another  family member; Other in her father; Prostate cancer in an other family member.   REVIEW OF SYSTEMS:  Negative except as outlined in the HPI.   All other systems are reviewed and negative.   PHYSICAL EXAM: VS:  BP 100/80 (BP Location: Left Arm, Patient Position: Sitting)   Pulse 76   Ht 5' 5 (1.651 m)   Wt 122 lb 9.6 oz (55.6 kg)   SpO2 99%   BMI 20.40 kg/m  , BMI Body mass index is 20.4 kg/m. GEN: Well nourished, well developed, in no acute distress HEENT: normal Neck: No JVD. carotids 2+ without bruits or masses Cardiac: The heart is RRR with a 3/6 harsh late peaking systolic murmur at the RUSB, A2 present but diminished Respiratory:  clear to auscultation bilaterally GI: soft, nontender, nondistended, + BS MS: no deformity or atrophy Skin: warm and dry, no rash Neuro:  Strength and sensation are intact Psych: euthymic mood, full affect  EKG:  EKG from 11/22/2023 reviewed and demonstrates NSR 80 bpm, incomplete RBBB  RECENT LABS: 12/15/2022: ALT 22; Hemoglobin 11.7; Platelets 144; TSH 1.79 11/22/2023: BUN 17; Creatinine, Ser 0.92; NT-Pro BNP 694; Potassium 4.7; Sodium 144  12/15/2022: Cholesterol 207; HDL 110; LDL Cholesterol (Calc) 82; Total CHOL/HDL Ratio 1.9; Triglycerides 71   Estimated Creatinine Clearance: 47.1 mL/min (by C-G formula based on SCr of 0.92 mg/dL).   Wt Readings from Last 3 Encounters:  12/09/23 122 lb 9.6 oz (55.6 kg)  11/22/23 124 lb (56.2 kg)  12/15/22 125 lb 3.2 oz (56.8 kg)     CARDIAC STUDIES: Cardiac Studies & Procedures   ______________________________________________________________________________________________   STRESS TESTS  NM PET CT CARDIAC PERFUSION MULTI W/ABSOLUTE BLOODFLOW 12/16/2021  Narrative   LV perfusion is abnormal. There is evidence of ischemia. There is no evidence of infarction. Defect 1: There is a small defect with mild reduction in uptake present in the mid anterolateral location(s) that is reversible. There is normal  wall motion in the defect area. Consistent with artifact.   Sub-optimal CT attenuation-misregistration.  Sub optimal stress input curves may be normal depending on Rubium Generator cycle of Rubifil.   Myocardial blood flow was computed to be 0.20ml/g/min at rest and 2.33ml/g/min at stress. Global myocardial blood flow reserve was 2.45 and was normal.   Rest left ventricular function is normal. Rest EF: 62 %. Stress left ventricular function is normal. Stress EF: 50 %. End diastolic cavity size is normal. End systolic cavity size is normal.   Normal LVEF Reserve 12%   Coronary calcium  was present on the attenuation correction CT images. Severe coronary calcifications were present. Coronary calcifications were present in the left anterior descending artery distribution(s).   Given normal MBFR, suspect the study is normal. The study is low risk.   Read  by Stanly Leavens MD FASE  CLINICAL DATA:  This over-read does not include interpretation of cardiac or coronary anatomy or pathology. The Cardiac PET CT interpretation by the cardiologist is attached.  COMPARISON:  None Available.  FINDINGS: Vascular: Atherosclerotic calcifications of the aorta. No acute noncardiac vascular finding.  Mediastinum/Nodes: Within the visualized portions of the chest there are no pathologically enlarged mediastinal, hilar or axillary lymph nodes within the limitation of noncontrast enhanced examination. The distal esophagus is grossly unremarkable.  Lungs/Pleura: Within the visualized portions of the chest there are no suspicious pulmonary nodules or masses no focal airspace consolidation, and no pleural effusion or pneumothorax on this motion degraded examination.  Upper Abdomen: No acute abnormality.  Musculoskeletal: No acute osseous abnormality. Multilevel degenerative changes spine.  IMPRESSION: No acute noncardiac finding in the chest.  Aortic Atherosclerosis (ICD10-I70.0).   Electronically  Signed By: Reyes Holder M.D. On: 12/16/2021 16:32   ECHOCARDIOGRAM  ECHOCARDIOGRAM COMPLETE 10/29/2023  Narrative ECHOCARDIOGRAM REPORT    Patient Name:   AIMI ESSNER Date of Exam: 10/29/2023 Medical Rec #:  [id number]      Height:       66.0 in Accession #:    [id number]     Weight:       125.2 lb Date of Birth:  June 14, 1950      BSA:          1.639 m Patient Age:    73 years       BP:           108/68 mmHg Patient Gender: F              HR:           76 bpm. Exam Location:  [address]  Procedure: 2D Echo, Color Doppler, Cardiac Doppler and 3D Echo (Both Spectral and Color Flow Doppler were utilized during procedure).  Indications:    Aortic stenosis I35.0  History:        Patient has prior history of Echocardiogram examinations, most recent 11/17/2022. Risk Factors:Hypertension and Dyslipidemia.  Sonographer:    Augustin Seals RDCS Referring Phys: [id number] MAUDE JAYSON EMMER  IMPRESSIONS   1. Left ventricular ejection fraction, by estimation, is 60 to 65%. The left ventricle has normal function. The left ventricle has no regional wall motion abnormalities. Left ventricular diastolic parameters were normal. 2. Right ventricular systolic function is normal. The right ventricular size is normal. There is normal pulmonary artery systolic pressure. 3. Aneurysmal fossa with likely PFO. cannot exclude a small PFO. 4. The mitral valve is abnormal. Trivial mitral valve regurgitation. No evidence of mitral stenosis. 5. Compared to TTE done 11/17/22 mean gradient 30-> 33 mmHg peak 48.7-> 48.3 mmHg DVI improved 0.19-> 0.27 and AVA improved 0.96 cm2 . The aortic valve is tricuspid. There is severe calcifcation of the aortic valve. Aortic valve regurgitation is mild. Moderate to severe aortic valve stenosis. 6. The inferior vena cava is normal in size with greater than 50% respiratory variability, suggesting right atrial pressure of 3 mmHg.  FINDINGS Left Ventricle: Left ventricular  ejection fraction, by estimation, is 60 to 65%. The left ventricle has normal function. The left ventricle has no regional wall motion abnormalities. Strain was performed and the global longitudinal strain is indeterminate. The left ventricular internal cavity size was normal in size. There is no left ventricular hypertrophy. Left ventricular diastolic parameters were normal.  Right Ventricle: The right ventricular size is normal. No increase in right ventricular wall thickness. Right  ventricular systolic function is normal. There is normal pulmonary artery systolic pressure. The tricuspid regurgitant velocity is 2.26 m/s, and with an assumed right atrial pressure of 3 mmHg, the estimated right ventricular systolic pressure is 23.4 mmHg.  Left Atrium: Left atrial size was normal in size.  Right Atrium: Right atrial size was normal in size.  Pericardium: There is no evidence of pericardial effusion.  Mitral Valve: The mitral valve is abnormal. There is mild thickening of the mitral valve leaflet(s). There is mild calcification of the mitral valve leaflet(s). Trivial mitral valve regurgitation. No evidence of mitral valve stenosis.  Tricuspid Valve: The tricuspid valve is normal in structure. Tricuspid valve regurgitation is mild . No evidence of tricuspid stenosis.  Aortic Valve: Compared to TTE done 11/17/22 mean gradient 30-> 33 mmHg peak 48.7-> 48.3 mmHg DVI improved 0.19-> 0.27 and AVA improved 0.96 cm2. The aortic valve is tricuspid. There is severe calcifcation of the aortic valve. Aortic valve regurgitation is mild. Aortic regurgitation PHT measures 642 msec. Moderate to severe aortic stenosis is present. Aortic valve mean gradient measures 33.0 mmHg. Aortic valve peak gradient measures 48.3 mmHg. Aortic valve area, by VTI measures 0.86 cm.  Pulmonic Valve: The pulmonic valve was normal in structure. Pulmonic valve regurgitation is not visualized. No evidence of pulmonic stenosis.  Aorta:  The aortic root is normal in size and structure.  Venous: The inferior vena cava is normal in size with greater than 50% respiratory variability, suggesting right atrial pressure of 3 mmHg.  IAS/Shunts: Cannot exclude a small PFO.  Additional Comments: 3D was performed not requiring image post processing on an independent workstation and was indeterminate.   LEFT VENTRICLE PLAX 2D LVIDd:         4.20 cm   Diastology LVIDs:         3.10 cm   LV e' medial:    6.09 cm/s LV PW:         0.90 cm   LV E/e' medial:  12.0 LV IVS:        0.70 cm   LV e' lateral:   7.72 cm/s LVOT diam:     2.00 cm   LV E/e' lateral: 9.5 LV SV:         76 LV SV Index:   46 LVOT Area:     3.14 cm  3D Volume EF: 3D EF:        50 % LV EDV:       109 ml LV ESV:       54 ml LV SV:        55 ml  RIGHT VENTRICLE            IVC RV Basal diam:  3.20 cm    IVC diam: 1.40 cm RV Mid diam:    2.20 cm RV S prime:     9.25 cm/s TAPSE (M-mode): 2.0 cm  LEFT ATRIUM           Index        RIGHT ATRIUM           Index LA diam:      2.70 cm 1.65 cm/m   RA Area:     13.30 cm LA Vol (A2C): 35.5 ml 21.66 ml/m  RA Volume:   32.80 ml  20.01 ml/m LA Vol (A4C): 28.9 ml 17.63 ml/m AORTIC VALVE AV Area (Vmax):    0.96 cm AV Area (Vmean):   0.85 cm AV Area (VTI):  0.86 cm AV Vmax:           347.67 cm/s AV Vmean:          274.000 cm/s AV VTI:            0.885 m AV Peak Grad:      48.3 mmHg AV Mean Grad:      33.0 mmHg LVOT Vmax:         106.00 cm/s LVOT Vmean:        74.400 cm/s LVOT VTI:          0.241 m LVOT/AV VTI ratio: 0.27 AI PHT:            642 msec  AORTA Ao Root diam: 3.40 cm Ao Asc diam:  3.50 cm  MITRAL VALVE                TRICUSPID VALVE MV Area (PHT): 3.27 cm     TR Peak grad:   20.4 mmHg MV Decel Time: 232 msec     TR Vmax:        226.00 cm/s MV E velocity: 73.20 cm/s MV A velocity: 112.00 cm/s  SHUNTS MV E/A ratio:  0.65         Systemic VTI:  0.24 m Systemic Diam: 2.00 cm  Maude Emmer MD Electronically signed by Maude Emmer MD Signature Date/Time: 10/29/2023/9:57:10 AM    Final      CT SCANS  CT CORONARY MORPH W/CTA COR W/SCORE 11/26/2023  Addendum 12/01/2023 11:34 AM ADDENDUM REPORT: 12/01/2023 11:32  EXAM: OVER-READ INTERPRETATION  CT CHEST  The following report is an over-read performed by radiologist Dr. Mabel Converse of [facility] on 12/01/2023. This over-read does not include interpretation of cardiac or coronary anatomy or pathology. The coronary CTA interpretation by the cardiologist is attached.  COMPARISON:  None.  FINDINGS: Normal heart size. No pericardial effusion. Image thoracic aorta is nonaneurysmal. Central pulmonary vasculature within normal limits.  No adenopathy within the included chest. Imaged lung fields are clear. No acute bony or chest wall abnormality.  IMPRESSION: No acute or significant extracardiac findings.   Electronically Signed By: Mabel Converse D.O. On: 12/01/2023 11:32  Narrative CLINICAL DATA:  66F with severe aortic stenosis being evaluated for a TAVR procedure.  EXAM: Cardiac TAVR CT  TECHNIQUE: A non-contrast, gated CT scan was obtained with axial slices of 2.5 mm through the heart for aortic valve scoring. A 120 kV retrospective, gated, contrast cardiac scan was obtained. Gantry rotation speed was 230 msec and collimation was 0.63 mm. Nitroglycerin  was not given. A delayed scan was obtained to exclude left atrial appendage thrombus. The 3D dataset was reconstructed in systole with motion correction. The 3D data set was reconstructed in 5% intervals of the 0-95% of the R-R cycle. Systolic and diastolic phases were analyzed on a dedicated workstation using MPR, MIP, and VRT modes. The patient received 100 cc of contrast.  FINDINGS: Aortic Root:  Aortic valve: Tricuspid, though functionally bicuspid with partial fusion of left and right cusps  Aortic valve calcium  score:  1624  Aortic annulus:  Diameter: 26mm x 24mm  Perimeter: 76mm  Area: 485mm^2  Calcifications: No calcifications  Coronary height: Min Left - 8mm; Min Right - 19mm  Sinotubular height: Left cusp -17mm ; Right cusp - 25mm; Noncoronary cusp - 24mm  LVOT (as measured 3 mm below the annulus):  Diameter: 25mm x 23mm  Area: 412mm^2  Calcifications: No calcifications  Aortic sinus width: Left cusp -  32mm; Right cusp - 28mm; Noncoronary cusp - 32mm  Sinotubular junction width: 26mm x 25mm  Optimum Fluoroscopic Angle for Delivery: RAO 2 CAU 42  Cardiac:  Right atrium: Mild enlargement  Right ventricle: Normal size  Pulmonary arteries: Normal size  Pulmonary veins: Normal configuration  Left atrium: Normal size.  Interatrial septal aneurysm.  Left ventricle: Normal size  Pericardium: Normal thickness  Coronary arteries: Normal origin.  Calcium  score 220  IMPRESSION: 1. Tricuspid aortic valve, though functionally bicuspid with partial fusion of left and right cusps. Severe aortic valve calcifications (AV calcium  score 1624)  2. Aortic annulus measures 26mm x 24mm in diameter with perimeter 76mm and area 460mm^2. No annular or LVOT calcifications. Annular measurements are right on the border between 23mm and 26mm Edwards Sapien 3 valve. Annular measurements would be appropriate for a 29mm Evolut valve, but sinus of Valsalva diameter at the right cusp (28mm) is below the recommended minimum (29mm) for a 29mm Evolut valve. Recommend discussion at structural heart conference.  3. Low coronary height to left main (8mm). Sufficient coronary height for RCA  4.  Optimum Fluoroscopic Angle for Delivery:  RAO 2 CAU 42  5.  Coronary calcium  score 220 (75th percentile)  Electronically Signed: By: Lonni Nanas M.D. On: 11/26/2023 14:16     ______________________________________________________________________________________________      STS Score    Procedure Type: Isolated AVR  Perioperative Outcome Estimate %  Operative Mortality 2.54%  Morbidity & Mortality 9.05%  Stroke 1.68%  Renal Failure 1.45%  Reoperation 4.81%  Prolonged Ventilation 4.34%  Deep Sternal Wound Infection 0.029%  Long Hospital Stay (>14 days) 4.28%  Short Hospital Stay (<6 days)* 44.8%   ASSESSMENT AND PLAN: 73 year old woman with severe, stage D1 aortic stenosis associated with NYHA functional class II symptoms of shortness of breath and fatigue.  I have personally reviewed her echo images, gated cardiac CTA, and CTA of the chest, abdomen, and pelvis.  Her echocardiogram shows normal LVEF of 60 to 65%, normal RV function, and severe calcification and restriction of the aortic valve with mild aortic insufficiency and moderately severe aortic stenosis with a peak transaortic velocity of 3.5 m/s, mean gradient 33 mmHg, dimensionless index 0.27, and calculated aortic valve area of 0.85 cm based on mean velocities, 0.86 cm based on VTI.  The patient's gated cardiac CTA shows an aortic valve calcium  score of 1624 consistent with severe aortic stenosis, and annular area 430 mm, a left coronary artery height of 8 mm, a right coronary height of 19 mm, and large sinuses with a left sinus measuring 32 mm and right sinus measuring 28 mm.  The aortic valve is tricuspid but functionally bicuspid with partial fusion of the left and right cusps.  I have reviewed the natural history of aortic stenosis with the patient and their family members who are present today. We have discussed the limitations of medical therapy and the poor prognosis associated with symptomatic aortic stenosis. We have reviewed potential treatment options, including palliative medical therapy, conventional surgical aortic valve replacement, and transcatheter aortic valve replacement. We discussed treatment options in the context of the patient's specific comorbid medical conditions.    I think it is  appropriate to pursue further evaluation for aortic valve replacement based on progressive aortic stenosis on serial echo studies, aortic valve calcium  score confirming severe aortic stenosis, and patient reporting progressive symptoms of shortness of breath and fatigue.  The patient will undergo a cardiac catheterization to evaluate for obstructive CAD. I have  reviewed the risks, indications, and alternatives to cardiac catheterization, possible angioplasty, and stenting with the patient. Risks include but are not limited to bleeding, infection, vascular injury, stroke, myocardial infection, arrhythmia, kidney injury, radiation-related injury in the case of prolonged fluoroscopy use, emergency cardiac surgery, and death. The patient understands the risks of serious complication is 1-2 in 1000 with diagnostic cardiac cath and 1-2% or less with angioplasty/stenting.  Once her cardiac catheterization is completed, she will be referred for formal cardiac surgical consultation as part of a multidisciplinary heart team approach to her care.  If she is ultimately felt to be an appropriate candidate for TAVR, I have reviewed her CT studies and she has transfemoral access with no significant plaque or stenosis of the iliofemoral arteries.  Her aortic annulus would be appropriate for a 23 mm Edwards SAPIEN 3 transcatheter heart valve.  Her left coronary height is low, but she has a large sinus and I think her risk of coronary obstruction would be very low.  Bonney Ozell Fell 12/09/2023 8:37 AM

## 2023-12-09 NOTE — Progress Notes (Addendum)
 New Hematology/Oncology Consult   Requesting MD: Dr. Maude Emmer  [phone]      Reason for Consult: Splenomegaly, thrombocytopenia  HPI: Ms. Brandner is a 73 year old woman found to have splenomegaly on CT angio chest/abdomen/pelvis 11/26/2023.  She has aortic stenosis with progression over the last few years.  Scans were obtained for further evaluation of aortic stenosis.  CT showed moderate splenomegaly with estimated volume of approximately 715 mL; irregular splenic enhancement likely secondary to arterial phase of imaging; splenic artery and vein patent; no enlarged lymph nodes in the chest, abdomen or pelvis; no focal liver abnormality seen.  No fevers or sweats.  She has a good appetite.  No weight loss.  She denies bleeding.  No abdominal pain.  No recurrent infections.  She reports arthritis mainly affecting her hands.  She reports a history of right side breast cancer about 20 years ago.  She had a lumpectomy and completed chemotherapy and radiation.  Extensive family history of breast cancer.  She reports genetic testing has been negative in the past.   Past Medical History:  Diagnosis Date   Allergy    rhinitis   Aortic stenosis, moderate    on echo in 2015.   Cancer [facility])    breast   Hyperlipidemia    Hypertension    Personal history of chemotherapy 2000   Personal history of radiation therapy 2000     Past Surgical History:  Procedure Laterality Date   ABDOMINAL HYSTERECTOMY     BIOPSY  11/20/2021   Procedure: BIOPSY;  Surgeon: Legrand Victory LITTIE DOUGLAS, MD;  Location: [facility];  Service: Gastroenterology;;   BREAST LUMPECTOMY Right 2000   malignant   COLONOSCOPY WITH PROPOFOL  N/A 11/20/2021   Procedure: COLONOSCOPY WITH PROPOFOL ;  Surgeon: Legrand Victory LITTIE DOUGLAS, MD;  Location: [facility];  Service: Gastroenterology;  Laterality: N/A;   CYSTOCELE REPAIR N/A 05/12/2018   ESOPHAGOGASTRODUODENOSCOPY (EGD) WITH PROPOFOL  N/A 11/20/2021   Procedure: ESOPHAGOGASTRODUODENOSCOPY  (EGD) WITH PROPOFOL ;  Surgeon: Legrand Victory LITTIE DOUGLAS, MD;  Location: [facility];  Service: Gastroenterology;  Laterality: N/A;   POLYPECTOMY  11/20/2021   Procedure: POLYPECTOMY;  Surgeon: Legrand Victory LITTIE DOUGLAS, MD;  Location: [facility];  Service: Gastroenterology;;     Current Outpatient Medications:    ALPRAZolam  (XANAX ) 0.5 MG tablet, TAKE 1/2 - 1 TABLET ONCE A DAY FOR ANXIETY, SLEEP (LIMIT TO LESS THAN 5 DAYS A WEEK), Disp: 25 tablet, Rfl: 0   buPROPion  (WELLBUTRIN  XL) 300 MG 24 hr tablet, TAKE ONE TABLET BY MOUTH EVERY MORNING FOR MOOD, ANXIETY, FOCUS AND CONCENTRATION, Disp: 90 tablet, Rfl: 3   Cholecalciferol (VITAMIN D ) 125 MCG (5000 UT) CAPS, Take 5,000 Units by mouth daily., Disp: , Rfl:    furosemide  (LASIX ) 20 MG tablet, Take 0.5 tablets (10 mg total) by mouth daily., Disp: 45 tablet, Rfl: 3   gabapentin  (NEURONTIN ) 100 MG capsule, TAKE ONE CAPSULE THREE TIMES DAILY., Disp: 270 capsule, Rfl: 3   ipratropium (ATROVENT ) 0.03 % nasal spray, Place 2 sprays into the nose 3 (three) times daily., Disp: 30 mL, Rfl: 2   metoprolol  tartrate (LOPRESSOR ) 50 MG tablet, Take one tablet by mouth as directed prior to CT scan., Disp: 1 tablet, Rfl: 0   rosuvastatin  (CRESTOR ) 5 MG tablet, TAKE ONE TABLET ONCE DAILY FOR CHOLESTEROL, Disp: 90 tablet, Rfl: 1   traZODone  (DESYREL ) 50 MG tablet, 1/2-1 tablet for sleep, Disp: 30 tablet, Rfl: 2   meloxicam (MOBIC) 7.5 MG tablet, Take 7.5 mg by mouth 2 (  two) times daily. PRN (Patient not taking: Reported on 12/10/2023), Disp: , Rfl: :     Allergies  Allergen Reactions   Influenza Vaccines Nausea And Vomiting and Other (See Comments)    dizziness   Other Nausea And Vomiting and Other (See Comments)    dizziness   Penicillamine Hives   Penicillins     REACTION: Hives   Sulfamethoxazole Hives   Sulfonamide Derivatives     REACTION: Hives    FH: Maternal grandmother breast cancer, mother with breast cancer, daughter breast cancer approximately age 56,  daughter breast cancer approximately age 62.  SOCIAL HISTORY: She lives in [place].  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  No tobacco use.  Alcohol intake reported as social, heavier in the past.  Review of Systems: No dysphagia.  No nausea or vomiting.  No change in bowel habits.  No numbness or tingling in the hands or feet.  Stable dyspnea on exertion.  History of multiple squamous cell skin cancers.  Physical Exam:  Blood pressure 133/76, pulse 100, temperature 97.8 F (36.6 C), resp. rate 18, height 5' 5 (1.651 m), weight 125 lb 9.6 oz (57 kg), SpO2 100%.  HEENT: No thrush or ulcers. Lungs: Lungs clear bilaterally. Cardiac: 3/6 systolic murmur.  Irregular. Abdomen: No hepatosplenomegaly.  Nontender.  Prominent vein pattern over the abdominal wall. Vascular: No leg edema. Lymph nodes: No palpable cervical, supraclavicular, axillary or inguinal lymph nodes. Neurologic: Alert and oriented. Skin: Brown discoloration anterior lower leg bilaterally. Musculoskeletal: Joint changes both hands consistent with arthritis.  LABS:   Recent Labs    12/10/23 1121  WBC 4.8  HGB 11.9*  HCT 36.2  PLT 173  Peripheral blood smear-platelets appear mildly decreased, majority of platelets are small; white cell morphology unremarkable; red cell morphology unremarkable.  No results for input(s): NA, K, CL, CO2, GLUCOSE, BUN, CREATININE, CALCIUM  in the last 72 hours.    RADIOLOGY:  CT ANGIO ABDOMEN PELVIS  W &/OR WO CONTRAST Addendum Date: 12/06/2023 ADDENDUM REPORT: 12/06/2023 08:05 DIAMETER MEASUREMENTS RELATIVE TO TAVR ACCESS: Right common femoral artery: 9 mm Right external iliac artery: 7 mm Right common iliac artery: 9 mm Left common femoral artery: 9 mm Left external iliac artery: 7 mm Left common iliac artery: 9-10 mm Electronically Signed   By: Marcey Moan M.D.   On: 12/06/2023 08:05   Result Date: 12/06/2023 CLINICAL DATA:  Aortic valvular stenosis and  evaluation for possible TAVR. EXAM: CT ANGIOGRAPHY CHEST, ABDOMEN AND PELVIS TECHNIQUE: Non-contrast CT of the chest was initially obtained. Multidetector CT imaging through the chest, abdomen and pelvis was performed using the standard protocol during bolus administration of intravenous contrast. Multiplanar reconstructed images and MIPs were obtained and reviewed to evaluate the vascular anatomy. RADIATION DOSE REDUCTION: This exam was performed according to the departmental dose-optimization program which includes automated exposure control, adjustment of the mA and/or kV according to patient size and/or use of iterative reconstruction technique. CONTRAST:  OMNIPAQUE  IOHEXOL  350 MG/ML SOLN COMPARISON:  None Available. FINDINGS: CTA CHEST FINDINGS Cardiovascular: The aortic valve is thickened and calcified. No evidence of aneurysmal disease of the thoracic aorta. No significant thoracic atherosclerosis. Visualized proximal great vessels demonstrate normal patency and normal branching anatomy. No evidence of aortic dissection. The heart size is normal. Calcified coronary artery plaque present. No pericardial fluid. Central pulmonary arteries are normal in caliber. Mediastinum/Nodes: No enlarged mediastinal, hilar, or axillary lymph nodes. Thyroid  gland, trachea, and esophagus demonstrate no significant findings. Lungs/Pleura:  Mild subpleural interstitial thickening and scarring in the anterior right upper lobe suggestive of secondary scarring from prior radiation therapy of the right breast. Small calcified granuloma in the right middle lobe. There is no evidence of pulmonary edema, consolidation, pneumothorax or pleural fluid. Musculoskeletal: No chest wall abnormality. No acute or significant osseous findings. Review of the MIP images confirms the above findings. CTA ABDOMEN AND PELVIS FINDINGS VASCULAR Aorta: Normal caliber abdominal aorta with mild tortuosity and no evidence of atherosclerosis, aneurysmal  disease or dissection. Celiac: Normally patent. Normally patent branch vessels and branching anatomy. SMA: Normally patent. Renals: Normally patent bilateral single renal arteries. IMA: Normally patent. Inflow: Normally patent bilateral iliac arteries demonstrating no evidence of aneurysmal disease or significant obstructive disease. Normally patent bilateral common femoral arteries and femoral bifurcations. Review of the MIP images confirms the above findings. NON-VASCULAR Hepatobiliary: No focal liver abnormality is seen. No gallstones, gallbladder wall thickening, or biliary dilatation. Pancreas: Unremarkable. No pancreatic ductal dilatation or surrounding inflammatory changes. Spleen: Moderate splenomegaly with estimated volume of approximately 715 mL. Irregular splenic enhancement likely secondary to arterial phase of imaging. Splenic artery and vein are patent. Adrenals/Urinary Tract: Adrenal glands are unremarkable. Kidneys are normal, without renal calculi, focal lesion, or hydronephrosis. Bladder is unremarkable. Stomach/Bowel: Bowel shows no evidence of obstruction, ileus, inflammation or lesion. The appendix is normal. No free intraperitoneal air. Lymphatic: No enlarged lymph nodes identified in the abdomen or pelvis. Reproductive: Status post hysterectomy. No adnexal masses. Other: No abdominal wall hernia or abnormality. No abdominopelvic ascites. Musculoskeletal: No bony lesions or fractures identified. Mild rightward convex scoliosis of the lumbar spine. Review of the MIP images confirms the above findings. IMPRESSION: 1. Thickened and calcified aortic valve. 2. Coronary atherosclerosis with calcified coronary artery plaque present. 3. No evidence of aneurysmal disease of the thoracic or abdominal aorta. 4. Moderate splenomegaly with estimated volume of approximately 715 mL. 5. Mild subpleural interstitial thickening and scarring in the anterior right upper lobe suggestive of secondary scarring from  prior radiation therapy of the right breast. Electronically Signed: By: Marcey Moan M.D. On: 12/04/2023 11:28   CT ANGIO CHEST AORTA W/CM & OR WO/CM Addendum Date: 12/06/2023 ADDENDUM REPORT: 12/06/2023 08:05 DIAMETER MEASUREMENTS RELATIVE TO TAVR ACCESS: Right common femoral artery: 9 mm Right external iliac artery: 7 mm Right common iliac artery: 9 mm Left common femoral artery: 9 mm Left external iliac artery: 7 mm Left common iliac artery: 9-10 mm Electronically Signed   By: Marcey Moan M.D.   On: 12/06/2023 08:05   Result Date: 12/06/2023 CLINICAL DATA:  Aortic valvular stenosis and evaluation for possible TAVR. EXAM: CT ANGIOGRAPHY CHEST, ABDOMEN AND PELVIS TECHNIQUE: Non-contrast CT of the chest was initially obtained. Multidetector CT imaging through the chest, abdomen and pelvis was performed using the standard protocol during bolus administration of intravenous contrast. Multiplanar reconstructed images and MIPs were obtained and reviewed to evaluate the vascular anatomy. RADIATION DOSE REDUCTION: This exam was performed according to the departmental dose-optimization program which includes automated exposure control, adjustment of the mA and/or kV according to patient size and/or use of iterative reconstruction technique. CONTRAST:  OMNIPAQUE  IOHEXOL  350 MG/ML SOLN COMPARISON:  None Available. FINDINGS: CTA CHEST FINDINGS Cardiovascular: The aortic valve is thickened and calcified. No evidence of aneurysmal disease of the thoracic aorta. No significant thoracic atherosclerosis. Visualized proximal great vessels demonstrate normal patency and normal branching anatomy. No evidence of aortic dissection. The heart size is normal. Calcified coronary artery plaque present. No pericardial  fluid. Central pulmonary arteries are normal in caliber. Mediastinum/Nodes: No enlarged mediastinal, hilar, or axillary lymph nodes. Thyroid  gland, trachea, and esophagus demonstrate no significant findings.  Lungs/Pleura: Mild subpleural interstitial thickening and scarring in the anterior right upper lobe suggestive of secondary scarring from prior radiation therapy of the right breast. Small calcified granuloma in the right middle lobe. There is no evidence of pulmonary edema, consolidation, pneumothorax or pleural fluid. Musculoskeletal: No chest wall abnormality. No acute or significant osseous findings. Review of the MIP images confirms the above findings. CTA ABDOMEN AND PELVIS FINDINGS VASCULAR Aorta: Normal caliber abdominal aorta with mild tortuosity and no evidence of atherosclerosis, aneurysmal disease or dissection. Celiac: Normally patent. Normally patent branch vessels and branching anatomy. SMA: Normally patent. Renals: Normally patent bilateral single renal arteries. IMA: Normally patent. Inflow: Normally patent bilateral iliac arteries demonstrating no evidence of aneurysmal disease or significant obstructive disease. Normally patent bilateral common femoral arteries and femoral bifurcations. Review of the MIP images confirms the above findings. NON-VASCULAR Hepatobiliary: No focal liver abnormality is seen. No gallstones, gallbladder wall thickening, or biliary dilatation. Pancreas: Unremarkable. No pancreatic ductal dilatation or surrounding inflammatory changes. Spleen: Moderate splenomegaly with estimated volume of approximately 715 mL. Irregular splenic enhancement likely secondary to arterial phase of imaging. Splenic artery and vein are patent. Adrenals/Urinary Tract: Adrenal glands are unremarkable. Kidneys are normal, without renal calculi, focal lesion, or hydronephrosis. Bladder is unremarkable. Stomach/Bowel: Bowel shows no evidence of obstruction, ileus, inflammation or lesion. The appendix is normal. No free intraperitoneal air. Lymphatic: No enlarged lymph nodes identified in the abdomen or pelvis. Reproductive: Status post hysterectomy. No adnexal masses. Other: No abdominal wall hernia  or abnormality. No abdominopelvic ascites. Musculoskeletal: No bony lesions or fractures identified. Mild rightward convex scoliosis of the lumbar spine. Review of the MIP images confirms the above findings. IMPRESSION: 1. Thickened and calcified aortic valve. 2. Coronary atherosclerosis with calcified coronary artery plaque present. 3. No evidence of aneurysmal disease of the thoracic or abdominal aorta. 4. Moderate splenomegaly with estimated volume of approximately 715 mL. 5. Mild subpleural interstitial thickening and scarring in the anterior right upper lobe suggestive of secondary scarring from prior radiation therapy of the right breast. Electronically Signed: By: Marcey Moan M.D. On: 12/04/2023 11:28   CT CORONARY MORPH W/CTA COR W/SCORE W/CA W/CM &/OR WO/CM Addendum Date: 12/01/2023 ADDENDUM REPORT: 12/01/2023 11:32 EXAM: OVER-READ INTERPRETATION  CT CHEST The following report is an over-read performed by radiologist Dr. Mabel Converse of [facility] on 12/01/2023. This over-read does not include interpretation of cardiac or coronary anatomy or pathology. The coronary CTA interpretation by the cardiologist is attached. COMPARISON:  None. FINDINGS: Normal heart size. No pericardial effusion. Image thoracic aorta is nonaneurysmal. Central pulmonary vasculature within normal limits. No adenopathy within the included chest. Imaged lung fields are clear. No acute bony or chest wall abnormality. IMPRESSION: No acute or significant extracardiac findings. Electronically Signed   By: Mabel Converse D.O.   On: 12/01/2023 11:32   Result Date: 12/01/2023 CLINICAL DATA:  41F with severe aortic stenosis being evaluated for a TAVR procedure. EXAM: Cardiac TAVR CT TECHNIQUE: A non-contrast, gated CT scan was obtained with axial slices of 2.5 mm through the heart for aortic valve scoring. A 120 kV retrospective, gated, contrast cardiac scan was obtained. Gantry rotation speed was 230 msec and  collimation was 0.63 mm. Nitroglycerin  was not given. A delayed scan was obtained to exclude left atrial appendage thrombus. The 3D dataset was reconstructed in  systole with motion correction. The 3D data set was reconstructed in 5% intervals of the 0-95% of the R-R cycle. Systolic and diastolic phases were analyzed on a dedicated workstation using MPR, MIP, and VRT modes. The patient received 100 cc of contrast. FINDINGS: Aortic Root: Aortic valve: Tricuspid, though functionally bicuspid with partial fusion of left and right cusps Aortic valve calcium  score: 1624 Aortic annulus: Diameter: 26mm x 24mm Perimeter: 76mm Area: 449mm^2 Calcifications: No calcifications Coronary height: Min Left - 8mm; Min Right - 19mm Sinotubular height: Left cusp -17mm ; Right cusp - 25mm; Noncoronary cusp - 24mm LVOT (as measured 3 mm below the annulus): Diameter: 25mm x 23mm Area: 468mm^2 Calcifications: No calcifications Aortic sinus width: Left cusp - 32mm; Right cusp - 28mm; Noncoronary cusp - 32mm Sinotubular junction width: 26mm x 25mm Optimum Fluoroscopic Angle for Delivery: RAO 2 CAU 42 Cardiac: Right atrium: Mild enlargement Right ventricle: Normal size Pulmonary arteries: Normal size Pulmonary veins: Normal configuration Left atrium: Normal size.  Interatrial septal aneurysm. Left ventricle: Normal size Pericardium: Normal thickness Coronary arteries: Normal origin.  Calcium  score 220 IMPRESSION: 1. Tricuspid aortic valve, though functionally bicuspid with partial fusion of left and right cusps. Severe aortic valve calcifications (AV calcium  score 1624) 2. Aortic annulus measures 26mm x 24mm in diameter with perimeter 76mm and area 44mm^2. No annular or LVOT calcifications. Annular measurements are right on the border between 23mm and 26mm Edwards Sapien 3 valve. Annular measurements would be appropriate for a 29mm Evolut valve, but sinus of Valsalva diameter at the right cusp (28mm) is below the recommended minimum (29mm)  for a 29mm Evolut valve. Recommend discussion at structural heart conference. 3. Low coronary height to left main (8mm). Sufficient coronary height for RCA 4.  Optimum Fluoroscopic Angle for Delivery:  RAO 2 CAU 42 5.  Coronary calcium  score 220 (75th percentile) Electronically Signed: By: Lonni Nanas M.D. On: 11/26/2023 14:16    Assessment and Plan:   Splenomegaly on CT 11/26/2023 Aortic stenosis History of breast cancer approximately 20 years ago, right lumpectomy, chemo and radiation Extensive family history of breast cancer with negative genetic testing per patient report Hypercholesterolemia  Ms. Spielberg was found to have splenomegaly on recent CT imaging.  Etiology is unclear.  She has some physical exam findings that could indicate underlying liver disease.  We will review the CT scan with radiology to look for evidence of cirrhosis.  She also has arthritis in her hands.  We are checking a rheumatoid factor. Rheumatoid arthritis can be associated with splenomegaly.  She will return for lab and follow-up in approximately 9 months.  We are available to see her sooner if needed.  Patient seen with Dr. Cloretta.   Olam Ned, NP 12/10/2023, 12:20 PM  This was a shared visit with Olam Ned.  Ms. Shively was interviewed and examined.  She is referred for evaluation of splenomegaly noted on a CT 11/26/2023.  The CT was obtained as a pre-TAVR study.  She does not have a history to suggest a lymphoproliferative disorder.  There is no obvious explanation for the splenomegaly.  The differential diagnosis includes underlying liver disease.  She reports a history of moderate alcohol use.  We reviewed the CT images with her today.  I will review the images with the radiologist to look for evidence of cirrhosis including a nodular liver contour and venous collaterals.  She has arthritis in the hands.  We will obtain a rheumatoid factor as rheumatoid arthritis can be associated  with  splenomegaly.  I was present for greater than 50% of today's visit.  I performed medical decision making.  Arvella Hof, MD  CT scan reviewed with radiology-no liver or vascular changes to suggest cirrhosis.  Plan for liver Doppler to further evaluate for portal hypertension, cirrhosis.  This information was reviewed with Ms. Umholtz by phone after the visit.

## 2023-12-10 ENCOUNTER — Ambulatory Visit: Admitting: Nurse Practitioner

## 2023-12-10 ENCOUNTER — Inpatient Hospital Stay: Attending: Nurse Practitioner

## 2023-12-10 ENCOUNTER — Encounter: Payer: Self-pay | Admitting: Nurse Practitioner

## 2023-12-10 VITALS — BP 133/76 | HR 100 | Temp 97.8°F | Resp 18 | Ht 65.0 in | Wt 125.6 lb

## 2023-12-10 DIAGNOSIS — R161 Splenomegaly, not elsewhere classified: Secondary | ICD-10-CM

## 2023-12-10 DIAGNOSIS — D696 Thrombocytopenia, unspecified: Secondary | ICD-10-CM | POA: Diagnosis present

## 2023-12-10 DIAGNOSIS — I350 Nonrheumatic aortic (valve) stenosis: Secondary | ICD-10-CM | POA: Diagnosis not present

## 2023-12-10 DIAGNOSIS — Z923 Personal history of irradiation: Secondary | ICD-10-CM | POA: Diagnosis not present

## 2023-12-10 DIAGNOSIS — Z79899 Other long term (current) drug therapy: Secondary | ICD-10-CM | POA: Insufficient documentation

## 2023-12-10 DIAGNOSIS — Z9221 Personal history of antineoplastic chemotherapy: Secondary | ICD-10-CM | POA: Insufficient documentation

## 2023-12-10 DIAGNOSIS — Z853 Personal history of malignant neoplasm of breast: Secondary | ICD-10-CM | POA: Diagnosis not present

## 2023-12-10 DIAGNOSIS — E7800 Pure hypercholesterolemia, unspecified: Secondary | ICD-10-CM | POA: Insufficient documentation

## 2023-12-10 DIAGNOSIS — M069 Rheumatoid arthritis, unspecified: Secondary | ICD-10-CM | POA: Diagnosis not present

## 2023-12-10 DIAGNOSIS — Z803 Family history of malignant neoplasm of breast: Secondary | ICD-10-CM | POA: Insufficient documentation

## 2023-12-10 LAB — CMP (CANCER CENTER ONLY)
ALT: 26 U/L (ref 0–44)
AST: 34 U/L (ref 15–41)
Albumin: 4.5 g/dL (ref 3.5–5.0)
Alkaline Phosphatase: 184 U/L — ABNORMAL HIGH (ref 38–126)
Anion gap: 14 (ref 5–15)
BUN: 14 mg/dL (ref 8–23)
CO2: 24 mmol/L (ref 22–32)
Calcium: 9.9 mg/dL (ref 8.9–10.3)
Chloride: 101 mmol/L (ref 98–111)
Creatinine: 0.98 mg/dL (ref 0.44–1.00)
GFR, Estimated: >60 mL/min (ref >60)
Glucose, Bld: 77 mg/dL (ref 70–99)
Potassium: 4 mmol/L (ref 3.5–5.1)
Sodium: 140 mmol/L (ref 135–145)
Total Bilirubin: 0.5 mg/dL (ref 0.0–1.2)
Total Protein: 6.5 g/dL (ref 6.5–8.1)

## 2023-12-10 LAB — CBC WITH DIFFERENTIAL (CANCER CENTER ONLY)
Abs Immature Granulocytes: 0.02 K/uL (ref 0.00–0.07)
Basophils Absolute: 0.1 K/uL (ref 0.0–0.1)
Basophils Relative: 1 %
Eosinophils Absolute: 0.1 K/uL (ref 0.0–0.5)
Eosinophils Relative: 2 %
HCT: 36.2 % (ref 36.0–46.0)
Hemoglobin: 11.9 g/dL — ABNORMAL LOW (ref 12.0–15.0)
Immature Granulocytes: 0 %
Lymphocytes Relative: 40 %
Lymphs Abs: 1.9 K/uL (ref 0.7–4.0)
MCH: 32.2 pg (ref 26.0–34.0)
MCHC: 32.9 g/dL (ref 30.0–36.0)
MCV: 98.1 fL (ref 80.0–100.0)
Monocytes Absolute: 0.4 K/uL (ref 0.1–1.0)
Monocytes Relative: 8 %
Neutro Abs: 2.3 K/uL (ref 1.7–7.7)
Neutrophils Relative %: 49 %
Platelet Count: 173 K/uL (ref 150–400)
RBC: 3.69 MIL/uL — ABNORMAL LOW (ref 3.87–5.11)
RDW: 12.8 % (ref 11.5–15.5)
WBC Count: 4.8 K/uL (ref 4.0–10.5)
nRBC: 0 % (ref 0.0–0.2)

## 2023-12-10 LAB — SAVE SMEAR(SSMR), FOR PROVIDER SLIDE REVIEW

## 2023-12-10 NOTE — Addendum Note (Signed)
 Addended by: DEBBY OLAM POUR on: 12/10/2023 04:53 PM   Modules accepted: Orders

## 2023-12-11 LAB — RHEUMATOID FACTOR: Rheumatoid fact SerPl-aCnc: <10 [IU]/mL (ref <14.0)

## 2023-12-14 ENCOUNTER — Ambulatory Visit (HOSPITAL_BASED_OUTPATIENT_CLINIC_OR_DEPARTMENT_OTHER): Admission: RE | Admit: 2023-12-14 | Source: Ambulatory Visit

## 2023-12-14 ENCOUNTER — Telehealth: Payer: Self-pay | Admitting: *Deleted

## 2023-12-14 NOTE — Telephone Encounter (Signed)
 Cardiac Catheterization scheduled at [facility] for: Thursday December 16, 2023 11 AM Arrival time [facility] Main Entrance A at: 9 AM  Diet: -Nothing to eat after midnight.  Hydration: -May drink clear liquids until 2 hours before the procedure.  Approved liquids: Water , clear tea, black coffee, fruit juices-non-citric and without pulp,Gatorade, plain Jello/popsicles.   -Please drink 16 oz bottle of water  2 hours before procedure.  Medication instructions: -Hold:  Lasix -AM of procedure  -Other usual morning medications can be taken including aspirin  81 mg.  Plan to go home the same day, you will only stay overnight if medically necessary.  You must have responsible adult to drive you home.  Someone must be with you the first 24 hours after you arrive home.  Reviewed procedure instructions with patient.

## 2023-12-15 ENCOUNTER — Telehealth: Payer: Self-pay

## 2023-12-15 NOTE — Telephone Encounter (Signed)
-----   Message from Olam Ned sent at 12/14/2023  4:18 PM EDT ----- Please let her know the rheumatoid test was normal.

## 2023-12-15 NOTE — Telephone Encounter (Signed)
 Patient voiced understanding, no further questions at this time.

## 2023-12-16 ENCOUNTER — Ambulatory Visit (HOSPITAL_COMMUNITY)
Admission: RE | Admit: 2023-12-16 | Discharge: 2023-12-16 | Disposition: A | Discharge status: Home / Self Care | Attending: Cardiovascular Disease | Admitting: Cardiovascular Disease

## 2023-12-16 ENCOUNTER — Other Ambulatory Visit: Payer: Self-pay

## 2023-12-16 ENCOUNTER — Encounter (HOSPITAL_COMMUNITY)
Admission: RE | Disposition: A | Discharge status: Home / Self Care | Payer: Self-pay | Source: Home / Self Care | Attending: Cardiovascular Disease

## 2023-12-16 DIAGNOSIS — I352 Nonrheumatic aortic (valve) stenosis with insufficiency: Secondary | ICD-10-CM | POA: Insufficient documentation

## 2023-12-16 DIAGNOSIS — I350 Nonrheumatic aortic (valve) stenosis: Secondary | ICD-10-CM

## 2023-12-16 HISTORY — PX: CORONARY ANGIOGRAPHY: CATH118303

## 2023-12-16 SURGERY — CORONARY ANGIOGRAPHY (CATH LAB)
Anesthesia: LOCAL

## 2023-12-16 MED ORDER — HEPARIN (PORCINE) IN NACL 1000-0.9 UT/500ML-% IV SOLN
INTRAVENOUS | Status: DC | PRN
  Administered 2023-12-16 (×2): 500 mL

## 2023-12-16 MED ORDER — SODIUM CHLORIDE 0.9% FLUSH: 3.0000 mL | INTRAVENOUS | Status: DC | PRN

## 2023-12-16 MED ORDER — VERAPAMIL HCL 2.5 MG/ML IV SOLN
INTRAVENOUS | Status: AC
  Filled 2023-12-16: qty 2

## 2023-12-16 MED ORDER — ASPIRIN 81 MG PO CHEW: 81.0000 mg | CHEWABLE_TABLET | ORAL | Status: DC

## 2023-12-16 MED ORDER — HEPARIN SODIUM (PORCINE) 1000 UNIT/ML IJ SOLN
INTRAMUSCULAR | Status: AC
  Filled 2023-12-16: qty 10

## 2023-12-16 MED ORDER — SODIUM CHLORIDE 0.9% FLUSH: 3.0000 mL | Freq: Two times a day (BID) | INTRAVENOUS | Status: DC

## 2023-12-16 MED ORDER — MIDAZOLAM HCL 2 MG/2ML IJ SOLN
INTRAMUSCULAR | Status: AC
  Filled 2023-12-16: qty 2

## 2023-12-16 MED ORDER — SODIUM CHLORIDE 0.9 % IV SOLN: 250.0000 mL | INTRAVENOUS | Status: DC | PRN

## 2023-12-16 MED ORDER — LABETALOL HCL 5 MG/ML IV SOLN
10.0000 mg | INTRAVENOUS | Status: DC | PRN
Start: 2023-12-16 — End: 2023-12-16

## 2023-12-16 MED ORDER — ONDANSETRON HCL 4 MG/2ML IJ SOLN: 4.0000 mg | Freq: Four times a day (QID) | INTRAMUSCULAR | Status: DC | PRN

## 2023-12-16 MED ORDER — FENTANYL CITRATE (PF) 100 MCG/2ML IJ SOLN
INTRAMUSCULAR | Status: DC | PRN
  Administered 2023-12-16: 25 ug via INTRAVENOUS

## 2023-12-16 MED ORDER — LIDOCAINE HCL (PF) 1 % IJ SOLN
INTRAMUSCULAR | Status: AC
  Filled 2023-12-16: qty 30

## 2023-12-16 MED ORDER — MIDAZOLAM HCL 2 MG/2ML IJ SOLN
INTRAMUSCULAR | Status: DC | PRN
  Administered 2023-12-16: 2 mg via INTRAVENOUS

## 2023-12-16 MED ORDER — FREE WATER: 500.0000 mL | Freq: Once | Status: DC

## 2023-12-16 MED ORDER — IOHEXOL 350 MG/ML SOLN
INTRAVENOUS | Status: DC | PRN
  Administered 2023-12-16: 30 mL

## 2023-12-16 MED ORDER — FENTANYL CITRATE (PF) 100 MCG/2ML IJ SOLN
INTRAMUSCULAR | Status: AC
Start: 2023-12-16 — End: 2023-12-16
  Filled 2023-12-16: qty 2

## 2023-12-16 MED ORDER — ACETAMINOPHEN 325 MG PO TABS
650.0000 mg | ORAL_TABLET | ORAL | Status: DC | PRN
Start: 2023-12-16 — End: 2023-12-16

## 2023-12-16 MED ORDER — HYDRALAZINE HCL 20 MG/ML IJ SOLN: 10.0000 mg | INTRAMUSCULAR | Status: DC | PRN

## 2023-12-16 MED ORDER — HEPARIN SODIUM (PORCINE) 1000 UNIT/ML IJ SOLN
INTRAMUSCULAR | Status: DC | PRN
  Administered 2023-12-16: 3000 [IU] via INTRAVENOUS

## 2023-12-16 MED ORDER — LIDOCAINE HCL (PF) 1 % IJ SOLN
INTRAMUSCULAR | Status: DC | PRN
  Administered 2023-12-16: 2 mL via INTRADERMAL

## 2023-12-16 MED ORDER — VERAPAMIL HCL 2.5 MG/ML IV SOLN
INTRAVENOUS | Status: DC | PRN
  Administered 2023-12-16: 10 mL via INTRA_ARTERIAL

## 2023-12-16 MED ORDER — VERAPAMIL HCL 2.5 MG/ML IV SOLN
INTRAVENOUS | Status: AC
Start: 2023-12-16 — End: 2023-12-16
  Filled 2023-12-16: qty 2

## 2023-12-16 SURGICAL SUPPLY — 8 items
CATH 5FR JL3.5 JR4 ANG PIG MP (CATHETERS) IMPLANT
DEVICE RAD TR BAND REGULAR (VASCULAR PRODUCTS) IMPLANT
GLIDESHEATH SLEND SS 6F .021 (SHEATH) IMPLANT
GUIDEWIRE INQWIRE 1.5J.035X260 (WIRE) IMPLANT
GUIDEWIRE TIGER .035X300 (WIRE) IMPLANT
PACK CARDIAC CATHETERIZATION (CUSTOM PROCEDURE TRAY) ×1 IMPLANT
SET ATX-X65L (MISCELLANEOUS) IMPLANT
SHEATH PROBE COVER 6X72 (BAG) IMPLANT

## 2023-12-16 NOTE — Discharge Instructions (Signed)

## 2023-12-16 NOTE — Interval H&P Note (Signed)
 History and Physical Interval Note:  12/16/2023 2:43 PM  Kathryn Chavez  has presented today for surgery, with the diagnosis of tavr workup - aortic stenosis.  The various methods of treatment have been discussed with the patient and family. After consideration of risks, benefits and other options for treatment, the patient has consented to  Procedure(s): LEFT HEART CATH AND CORONARY ANGIOGRAPHY (N/A) as a surgical intervention.  The patient's history has been reviewed, patient examined, no change in status, stable for surgery.  I have reviewed the patient's chart and labs.  Questions were answered to the patient's satisfaction.     Ozell Fell

## 2023-12-17 ENCOUNTER — Encounter (HOSPITAL_COMMUNITY): Payer: Self-pay | Admitting: Cardiovascular Disease

## 2023-12-22 ENCOUNTER — Ambulatory Visit (HOSPITAL_COMMUNITY)
Admission: RE | Admit: 2023-12-22 | Discharge: 2023-12-22 | Disposition: A | Discharge status: Home / Self Care | Source: Ambulatory Visit | Attending: Nurse Practitioner | Admitting: Nurse Practitioner

## 2023-12-22 ENCOUNTER — Encounter (HOSPITAL_COMMUNITY): Payer: Self-pay | Admitting: Radiology

## 2023-12-22 DIAGNOSIS — R161 Splenomegaly, not elsewhere classified: Secondary | ICD-10-CM | POA: Insufficient documentation

## 2023-12-22 NOTE — Progress Notes (Unsigned)
 [address]       [place] [address]             [phone]        Kathryn Chavez [facility] Medical Record #[id number] Date of Birth: January 02, 1951  Referring: Laurice President, NP Primary Care: Laurice President, NP Primary Cardiologist:Peter Delford, MD  Chief Complaint:   No chief complaint on file.   History of Present Illness:     Kathryn Chavez is a 73 y.o. female presents for surgical evaluation of ***   Kathryn Chavez is a 72 y.o. female who presents for evaluation of aortic stenosis, referred by Dr Delford.   The patient has been followed for aortic stenosis now for several years. Since 2019, her LVEF has remained normal at 60-65% and her mean aortic valve gradient has increased from 19 mmHg to on her recent echo in July 2025. On this echo, the DI is 0.27 and AVA is 0.96. On TAVR CT, her valve is functionally bicuspid with calcium  score 1624, and annular area 430 square mm.    She was first told of having a heart murmur approximately 10 years ago. She has no hx of rheumatic fever. She has been healthy with no major medical problems. She admits to chest tightness at times but has been more symptomatic since finding out about her aortic stenosis. She also complains of exertional dyspnea but states that this has been present for several years. She is short of breath with stairs or walking up an incline. She has no limitation with walking on flat ground. No orthopnea, PND, or palpitations. She feels there is slow worsening of her shortness of breath and fatigue.    The patient is here with her husband today. 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  She has one daughter and has lost one daughter. The patient has had regular dental care and reports no recent problems. She is seen every 6 months.    Past Medical History:  Diagnosis Date   Allergy    rhinitis   Aortic stenosis, moderate    on echo in 2015.   Cancer [facility])    breast    Hyperlipidemia    Hypertension    Personal history of chemotherapy 2000   Personal history of radiation therapy 2000    Past Surgical History:  Procedure Laterality Date   ABDOMINAL HYSTERECTOMY     BIOPSY  11/20/2021   Procedure: BIOPSY;  Surgeon: Legrand Victory LITTIE DOUGLAS, MD;  Location: [facility];  Service: Gastroenterology;;   BREAST LUMPECTOMY Right 2000   malignant   COLONOSCOPY WITH PROPOFOL  N/A 11/20/2021   Procedure: COLONOSCOPY WITH PROPOFOL ;  Surgeon: Legrand Victory LITTIE DOUGLAS, MD;  Location: [facility];  Service: Gastroenterology;  Laterality: N/A;   CORONARY ANGIOGRAPHY N/A 12/16/2023   Procedure: CORONARY ANGIOGRAPHY;  Surgeon: Wonda Sharper, MD;  Location: [facility] INVASIVE CV LAB;  Service: Cardiovascular;  Laterality: N/A;   CYSTOCELE REPAIR N/A 05/12/2018   ESOPHAGOGASTRODUODENOSCOPY (EGD) WITH PROPOFOL  N/A 11/20/2021   Procedure: ESOPHAGOGASTRODUODENOSCOPY (EGD) WITH PROPOFOL ;  Surgeon: Legrand Victory LITTIE DOUGLAS, MD;  Location: [facility];  Service: Gastroenterology;  Laterality: N/A;   POLYPECTOMY  11/20/2021   Procedure: POLYPECTOMY;  Surgeon: Legrand Victory LITTIE DOUGLAS, MD;  Location: [facility];  Service: Gastroenterology;;    Social History:  Social History   Tobacco Use  Smoking Status Never  Smokeless Tobacco Never    Social History  Substance and Sexual Activity  Alcohol Use No   Comment: social     Allergies  Allergen Reactions   Influenza Vaccines Nausea And Vomiting and Other (See Comments)    dizziness   Penicillins     REACTION: Hives   Sulfamethoxazole Hives   Sulfonamide Derivatives     REACTION: Hives      Current Outpatient Medications  Medication Sig Dispense Refill   ALPRAZolam  (XANAX ) 0.5 MG tablet TAKE 1/2 - 1 TABLET ONCE A DAY FOR ANXIETY, SLEEP (LIMIT TO LESS THAN 5 DAYS A WEEK) 25 tablet 0   buPROPion  (WELLBUTRIN  XL) 300 MG 24 hr tablet TAKE ONE TABLET BY MOUTH EVERY MORNING FOR MOOD, ANXIETY, FOCUS AND CONCENTRATION 90 tablet 3   Cholecalciferol  (VITAMIN D ) 125 MCG (5000 UT) CAPS Take 5,000 Units by mouth daily.     furosemide  (LASIX ) 20 MG tablet Take 0.5 tablets (10 mg total) by mouth daily. 45 tablet 3   gabapentin  (NEURONTIN ) 100 MG capsule TAKE ONE CAPSULE THREE TIMES DAILY. 270 capsule 3   ipratropium (ATROVENT ) 0.03 % nasal spray Place 2 sprays into the nose 3 (three) times daily. (Patient taking differently: Place 2 sprays into the nose daily.) 30 mL 2   metoprolol  tartrate (LOPRESSOR ) 50 MG tablet Take one tablet by mouth as directed prior to CT scan. 1 tablet 0   rosuvastatin  (CRESTOR ) 5 MG tablet TAKE ONE TABLET ONCE DAILY FOR CHOLESTEROL 90 tablet 1   traZODone  (DESYREL ) 50 MG tablet 1/2-1 tablet for sleep (Patient taking differently: Take 25 mg by mouth at bedtime as needed for sleep.) 30 tablet 2   No current facility-administered medications for this visit.    (Not in a hospital admission)   Family History  Problem Relation Age of Onset   Breast cancer Mother 22   Lung cancer Mother    Other Father        BRAIN TUMOR   Breast cancer Daughter 74   Breast cancer Daughter 24   Colitis Daughter    Arthritis Other    Hyperlipidemia Other    Lung cancer Other    Prostate cancer Other    Breast cancer Other      Review of Systems:   ROS    Physical Exam: There were no vitals taken for this visit. Physical Exam    Cardiac Studies & Procedures   ______________________________________________________________________________________________ CARDIAC CATHETERIZATION  CARDIAC CATHETERIZATION 12/16/2023  Conclusion 1) Widely patent coronary arteries with no significant coronary artery disease (left dominant) 2) Calcified, restricted appearing aortic valve on plain fluoroscopy with known severe aortic stenosis  Recommendations: Continue TAVR evaluation  Findings Coronary Findings Diagnostic  Dominance: Left  Left Main Vessel is angiographically normal. Patent vessel with no significant stenosis  Left  Anterior Descending Vessel is small. The LAD is a relatively small caliber vessel.  The LAD is patent with no significant stenosis.  The diagonal branches are patent.  The LAD terminates in the distal anterior wall.  Left Circumflex Vessel is large. The circumflex is super dominant with a large obtuse marginal branch, AV circumflex supplying PDA and PLA branches.  All are patent with no significant stenoses.  First Obtuse Marginal Branch Vessel is large in size.  Right Coronary Artery Vessel is small. Vessel is angiographically normal. Small nondominant RCA with no stenosis  Intervention  No interventions have been documented.   STRESS TESTS  NM PET CT CARDIAC PERFUSION MULTI W/ABSOLUTE BLOODFLOW 12/16/2021  Narrative   LV perfusion is abnormal.  There is evidence of ischemia. There is no evidence of infarction. Defect 1: There is a small defect with mild reduction in uptake present in the mid anterolateral location(s) that is reversible. There is normal wall motion in the defect area. Consistent with artifact.   Sub-optimal CT attenuation-misregistration.  Sub optimal stress input curves may be normal depending on Rubium Generator cycle of Rubifil.   Myocardial blood flow was computed to be 0.25ml/g/min at rest and 2.57ml/g/min at stress. Global myocardial blood flow reserve was 2.45 and was normal.   Rest left ventricular function is normal. Rest EF: 62 %. Stress left ventricular function is normal. Stress EF: 50 %. End diastolic cavity size is normal. End systolic cavity size is normal.   Normal LVEF Reserve 12%   Coronary calcium  was present on the attenuation correction CT images. Severe coronary calcifications were present. Coronary calcifications were present in the left anterior descending artery distribution(s).   Given normal MBFR, suspect the study is normal. The study is low risk.   Read by Kathryn Leavens MD FASE  CLINICAL DATA:  This over-read does not include  interpretation of cardiac or coronary anatomy or pathology. The Cardiac PET CT interpretation by the cardiologist is attached.  COMPARISON:  None Available.  FINDINGS: Vascular: Atherosclerotic calcifications of the aorta. No acute noncardiac vascular finding.  Mediastinum/Nodes: Within the visualized portions of the chest there are no pathologically enlarged mediastinal, hilar or axillary lymph nodes within the limitation of noncontrast enhanced examination. The distal esophagus is grossly unremarkable.  Lungs/Pleura: Within the visualized portions of the chest there are no suspicious pulmonary nodules or masses no focal airspace consolidation, and no pleural effusion or pneumothorax on this motion degraded examination.  Upper Abdomen: No acute abnormality.  Musculoskeletal: No acute osseous abnormality. Multilevel degenerative changes spine.  IMPRESSION: No acute noncardiac finding in the chest.  Aortic Atherosclerosis (ICD10-I70.0).   Electronically Signed By: Kathryn Chavez M.D. On: 12/16/2021 16:32   ECHOCARDIOGRAM  ECHOCARDIOGRAM COMPLETE 10/29/2023  Narrative ECHOCARDIOGRAM REPORT    Patient Name:   Kathryn Chavez Date of Exam: 10/29/2023 Medical Rec #:  [id number]      Height:       66.0 in Accession #:    [id number]     Weight:       125.2 lb Date of Birth:  12/07/1950      BSA:          1.639 m Patient Age:    73 years       BP:           108/68 mmHg Patient Gender: F              HR:           76 bpm. Exam Location:  [address]  Procedure: 2D Echo, Color Doppler, Cardiac Doppler and 3D Echo (Both Spectral and Color Flow Doppler were utilized during procedure).  Indications:    Aortic stenosis I35.0  History:        Patient has prior history of Echocardiogram examinations, most recent 11/17/2022. Risk Factors:Hypertension and Dyslipidemia.  Sonographer:    Augustin Seals RDCS Referring Phys: [id number] MAUDE JAYSON EMMER  IMPRESSIONS   1. Left  ventricular ejection fraction, by estimation, is 60 to 65%. The left ventricle has normal function. The left ventricle has no regional wall motion abnormalities. Left ventricular diastolic parameters were normal. 2. Right ventricular systolic function is normal. The right ventricular size is normal. There is normal pulmonary artery  systolic pressure. 3. Aneurysmal fossa with likely PFO. cannot exclude a small PFO. 4. The mitral valve is abnormal. Trivial mitral valve regurgitation. No evidence of mitral stenosis. 5. Compared to TTE done 11/17/22 mean gradient 30-> 33 mmHg peak 48.7-> 48.3 mmHg DVI improved 0.19-> 0.27 and AVA improved 0.96 cm2 . The aortic valve is tricuspid. There is severe calcifcation of the aortic valve. Aortic valve regurgitation is mild. Moderate to severe aortic valve stenosis. 6. The inferior vena cava is normal in size with greater than 50% respiratory variability, suggesting right atrial pressure of 3 mmHg.  FINDINGS Left Ventricle: Left ventricular ejection fraction, by estimation, is 60 to 65%. The left ventricle has normal function. The left ventricle has no regional wall motion abnormalities. Strain was performed and the global longitudinal strain is indeterminate. The left ventricular internal cavity size was normal in size. There is no left ventricular hypertrophy. Left ventricular diastolic parameters were normal.  Right Ventricle: The right ventricular size is normal. No increase in right ventricular wall thickness. Right ventricular systolic function is normal. There is normal pulmonary artery systolic pressure. The tricuspid regurgitant velocity is 2.26 m/s, and with an assumed right atrial pressure of 3 mmHg, the estimated right ventricular systolic pressure is 23.4 mmHg.  Left Atrium: Left atrial size was normal in size.  Right Atrium: Right atrial size was normal in size.  Pericardium: There is no evidence of pericardial effusion.  Mitral Valve: The mitral  valve is abnormal. There is mild thickening of the mitral valve leaflet(s). There is mild calcification of the mitral valve leaflet(s). Trivial mitral valve regurgitation. No evidence of mitral valve stenosis.  Tricuspid Valve: The tricuspid valve is normal in structure. Tricuspid valve regurgitation is mild . No evidence of tricuspid stenosis.  Aortic Valve: Compared to TTE done 11/17/22 mean gradient 30-> 33 mmHg peak 48.7-> 48.3 mmHg DVI improved 0.19-> 0.27 and AVA improved 0.96 cm2. The aortic valve is tricuspid. There is severe calcifcation of the aortic valve. Aortic valve regurgitation is mild. Aortic regurgitation PHT measures 642 msec. Moderate to severe aortic stenosis is present. Aortic valve mean gradient measures 33.0 mmHg. Aortic valve peak gradient measures 48.3 mmHg. Aortic valve area, by VTI measures 0.86 cm.  Pulmonic Valve: The pulmonic valve was normal in structure. Pulmonic valve regurgitation is not visualized. No evidence of pulmonic stenosis.  Aorta: The aortic root is normal in size and structure.  Venous: The inferior vena cava is normal in size with greater than 50% respiratory variability, suggesting right atrial pressure of 3 mmHg.  IAS/Shunts: Cannot exclude a small PFO.  Additional Comments: 3D was performed not requiring image post processing on an independent workstation and was indeterminate.   LEFT VENTRICLE PLAX 2D LVIDd:         4.20 cm   Diastology LVIDs:         3.10 cm   LV e' medial:    6.09 cm/s LV PW:         0.90 cm   LV E/e' medial:  12.0 LV IVS:        0.70 cm   LV e' lateral:   7.72 cm/s LVOT diam:     2.00 cm   LV E/e' lateral: 9.5 LV SV:         76 LV SV Index:   46 LVOT Area:     3.14 cm  3D Volume EF: 3D EF:        50 % LV EDV:  109 ml LV ESV:       54 ml LV SV:        55 ml  RIGHT VENTRICLE            IVC RV Basal diam:  3.20 cm    IVC diam: 1.40 cm RV Mid diam:    2.20 cm RV S prime:     9.25 cm/s TAPSE (M-mode): 2.0  cm  LEFT ATRIUM           Index        RIGHT ATRIUM           Index LA diam:      2.70 cm 1.65 cm/m   RA Area:     13.30 cm LA Vol (A2C): 35.5 ml 21.66 ml/m  RA Volume:   32.80 ml  20.01 ml/m LA Vol (A4C): 28.9 ml 17.63 ml/m AORTIC VALVE AV Area (Vmax):    0.96 cm AV Area (Vmean):   0.85 cm AV Area (VTI):     0.86 cm AV Vmax:           347.67 cm/s AV Vmean:          274.000 cm/s AV VTI:            0.885 m AV Peak Grad:      48.3 mmHg AV Mean Grad:      33.0 mmHg LVOT Vmax:         106.00 cm/s LVOT Vmean:        74.400 cm/s LVOT VTI:          0.241 m LVOT/AV VTI ratio: 0.27 AI PHT:            642 msec  AORTA Ao Root diam: 3.40 cm Ao Asc diam:  3.50 cm  MITRAL VALVE                TRICUSPID VALVE MV Area (PHT): 3.27 cm     TR Peak grad:   20.4 mmHg MV Decel Time: 232 msec     TR Vmax:        226.00 cm/s MV E velocity: 73.20 cm/s MV A velocity: 112.00 cm/s  SHUNTS MV E/A ratio:  0.65         Systemic VTI:  0.24 m Systemic Diam: 2.00 cm  Maude Emmer MD Electronically signed by Maude Emmer MD Signature Date/Time: 10/29/2023/9:57:10 AM    Final      CT SCANS  CT CORONARY MORPH W/CTA COR W/SCORE 11/26/2023  Addendum 12/01/2023 11:34 AM ADDENDUM REPORT: 12/01/2023 11:32  EXAM: OVER-READ INTERPRETATION  CT CHEST  The following report is an over-read performed by radiologist Dr. Mabel Converse of [facility] on 12/01/2023. This over-read does not include interpretation of cardiac or coronary anatomy or pathology. The coronary CTA interpretation by the cardiologist is attached.  COMPARISON:  None.  FINDINGS: Normal heart size. No pericardial effusion. Image thoracic aorta is nonaneurysmal. Central pulmonary vasculature within normal limits.  No adenopathy within the included chest. Imaged lung fields are clear. No acute bony or chest wall abnormality.  IMPRESSION: No acute or significant extracardiac findings.   Electronically  Signed By: Mabel Converse D.O. On: 12/01/2023 11:32  Narrative CLINICAL DATA:  89F with severe aortic stenosis being evaluated for a TAVR procedure.  EXAM: Cardiac TAVR CT  TECHNIQUE: A non-contrast, gated CT scan was obtained with axial slices of 2.5 mm through the heart for aortic valve scoring. A 120 kV retrospective, gated, contrast  cardiac scan was obtained. Gantry rotation speed was 230 msec and collimation was 0.63 mm. Nitroglycerin  was not given. A delayed scan was obtained to exclude left atrial appendage thrombus. The 3D dataset was reconstructed in systole with motion correction. The 3D data set was reconstructed in 5% intervals of the 0-95% of the R-R cycle. Systolic and diastolic phases were analyzed on a dedicated workstation using MPR, MIP, and VRT modes. The patient received 100 cc of contrast.  FINDINGS: Aortic Root:  Aortic valve: Tricuspid, though functionally bicuspid with partial fusion of left and right cusps  Aortic valve calcium  score: 1624  Aortic annulus:  Diameter: 26mm x 24mm  Perimeter: 76mm  Area: 481mm^2  Calcifications: No calcifications  Coronary height: Min Left - 8mm; Min Right - 19mm  Sinotubular height: Left cusp -17mm ; Right cusp - 25mm; Noncoronary cusp - 24mm  LVOT (as measured 3 mm below the annulus):  Diameter: 25mm x 23mm  Area: 440mm^2  Calcifications: No calcifications  Aortic sinus width: Left cusp - 32mm; Right cusp - 28mm; Noncoronary cusp - 32mm  Sinotubular junction width: 26mm x 25mm  Optimum Fluoroscopic Angle for Delivery: RAO 2 CAU 42  Cardiac:  Right atrium: Mild enlargement  Right ventricle: Normal size  Pulmonary arteries: Normal size  Pulmonary veins: Normal configuration  Left atrium: Normal size.  Interatrial septal aneurysm.  Left ventricle: Normal size  Pericardium: Normal thickness  Coronary arteries: Normal origin.  Calcium  score 220  IMPRESSION: 1. Tricuspid aortic  valve, though functionally bicuspid with partial fusion of left and right cusps. Severe aortic valve calcifications (AV calcium  score 1624)  2. Aortic annulus measures 26mm x 24mm in diameter with perimeter 76mm and area 427mm^2. No annular or LVOT calcifications. Annular measurements are right on the border between 23mm and 26mm Edwards Sapien 3 valve. Annular measurements would be appropriate for a 29mm Evolut valve, but sinus of Valsalva diameter at the right cusp (28mm) is below the recommended minimum (29mm) for a 29mm Evolut valve. Recommend discussion at structural heart conference.  3. Low coronary height to left main (8mm). Sufficient coronary height for RCA  4.  Optimum Fluoroscopic Angle for Delivery:  RAO 2 CAU 42  5.  Coronary calcium  score 220 (75th percentile)  Electronically Signed: By: Lonni Nanas M.D. On: 11/26/2023 14:16     ______________________________________________________________________________________________      ECG ***    I have independently reviewed the above radiologic studies and discussed with the patient   Recent Lab Findings: Lab Results  Component Value Date   WBC 4.8 12/10/2023   HGB 11.9 (L) 12/10/2023   HCT 36.2 12/10/2023   PLT 173 12/10/2023   GLUCOSE 77 12/10/2023   CHOL 207 (H) 12/15/2022   TRIG 71 12/15/2022   HDL 110 12/15/2022   LDLDIRECT 73.3 04/30/2011   LDLCALC 82 12/15/2022   ALT 26 12/10/2023   AST 34 12/10/2023   NA 140 12/10/2023   K 4.0 12/10/2023   CL 101 12/10/2023   CREATININE 0.98 12/10/2023   BUN 14 12/10/2023   CO2 24 12/10/2023   TSH 1.79 12/15/2022   HGBA1C 5.4 09/08/2022   Low left main height at 8mm, but large sinuses   Assessment / Plan:   73 y.o. female with severe aortic stenosis.  STS score: ***.  NYHA Class ***.  The risks and benefits of *** TAVR were discussed in detail.  We also discussed possibility of an emergent sternotomy to address any procedural complications.  Based  on our discussion, we collectively decided that an emergent sternotomy would *** be indicated.  The patient is *** agreeable to proceed.  Based on my review of her LHC, echo, and CTA, I agree with the multidisciplinary plan to proceed with a *** TAVR.      I  spent {CHL ONC TIME VISIT - DTPQU:[id number]} counseling the patient face to face.   Linnie MALVA Rayas 12/22/2023 9:55 AM

## 2023-12-24 ENCOUNTER — Ambulatory Visit
Attending: Thoracic Surgery (Cardiothoracic Vascular Surgery) | Admitting: Thoracic Surgery (Cardiothoracic Vascular Surgery)

## 2023-12-24 VITALS — BP 118/65 | HR 78 | Resp 18 | Ht 65.0 in | Wt 125.0 lb

## 2023-12-24 DIAGNOSIS — I350 Nonrheumatic aortic (valve) stenosis: Secondary | ICD-10-CM | POA: Diagnosis not present

## 2023-12-31 ENCOUNTER — Telehealth: Payer: Self-pay

## 2023-12-31 NOTE — Telephone Encounter (Signed)
 Patient gave verbal understanding and had no further questions or concerns

## 2023-12-31 NOTE — Telephone Encounter (Signed)
-----   Message from Olam Ned sent at 12/31/2023 12:55 PM EDT ----- Please let her know the liver Doppler shows splenomegaly.  Follow-up as scheduled.

## 2024-01-03 ENCOUNTER — Other Ambulatory Visit: Payer: Self-pay | Admitting: Nurse Practitioner

## 2024-01-03 DIAGNOSIS — Z1231 Encounter for screening mammogram for malignant neoplasm of breast: Secondary | ICD-10-CM

## 2024-01-26 ENCOUNTER — Ambulatory Visit (INDEPENDENT_AMBULATORY_CARE_PROVIDER_SITE_OTHER): Admitting: Otolaryngology

## 2024-01-26 ENCOUNTER — Encounter (INDEPENDENT_AMBULATORY_CARE_PROVIDER_SITE_OTHER): Payer: Self-pay | Admitting: Otolaryngology

## 2024-01-26 VITALS — BP 117/79 | HR 72 | Temp 97.4°F | Ht 66.0 in | Wt 120.0 lb

## 2024-01-26 DIAGNOSIS — H9313 Tinnitus, bilateral: Secondary | ICD-10-CM | POA: Diagnosis not present

## 2024-01-26 DIAGNOSIS — H903 Sensorineural hearing loss, bilateral: Secondary | ICD-10-CM

## 2024-01-26 NOTE — Progress Notes (Signed)
 CC: Bilateral tinnitus, hearing loss  Discussed the use of AI scribe software for clinical note transcription with the patient, who gave verbal consent to proceed.  History of Present Illness Kathryn Chavez is a 73 year old female who presents with tinnitus and hearing loss.   She has experienced a persistent buzzing sound in both ears for over four years, described as 'a bee in both ears.' The tinnitus is constant, non-pulsatile, and particularly bothersome at night when trying to sleep, as it becomes more noticeable in quiet environments.  She has significant difficulty hearing others, impacting her work life. Colleagues have expressed concern and suggested hearing aids, although she has never tried them. She has not had her hearing tested in over twenty years.  No history of ear infections or ear surgeries. She denies significant exposure to loud noises.    Past Medical History:  Diagnosis Date   Allergy    rhinitis   Aortic stenosis, moderate    on echo in 2015.   Cancer [facility])    breast   Hyperlipidemia    Hypertension    Personal history of chemotherapy 2000   Personal history of radiation therapy 2000    Past Surgical History:  Procedure Laterality Date   ABDOMINAL HYSTERECTOMY     BIOPSY  11/20/2021   Procedure: BIOPSY;  Surgeon: Legrand Victory LITTIE DOUGLAS, MD;  Location: [facility];  Service: Gastroenterology;;   BREAST LUMPECTOMY Right 2000   malignant   COLONOSCOPY WITH PROPOFOL  N/A 11/20/2021   Procedure: COLONOSCOPY WITH PROPOFOL ;  Surgeon: Legrand Victory LITTIE DOUGLAS, MD;  Location: [facility];  Service: Gastroenterology;  Laterality: N/A;   CORONARY ANGIOGRAPHY N/A 12/16/2023   Procedure: CORONARY ANGIOGRAPHY;  Surgeon: Wonda Sharper, MD;  Location: [facility] INVASIVE CV LAB;  Service: Cardiovascular;  Laterality: N/A;   CYSTOCELE REPAIR N/A 05/12/2018   ESOPHAGOGASTRODUODENOSCOPY (EGD) WITH PROPOFOL  N/A 11/20/2021   Procedure: ESOPHAGOGASTRODUODENOSCOPY (EGD) WITH PROPOFOL ;  Surgeon:  Legrand Victory LITTIE DOUGLAS, MD;  Location: [facility];  Service: Gastroenterology;  Laterality: N/A;   POLYPECTOMY  11/20/2021   Procedure: POLYPECTOMY;  Surgeon: Legrand Victory LITTIE DOUGLAS, MD;  Location: [facility];  Service: Gastroenterology;;    Family History  Problem Relation Age of Onset   Breast cancer Mother 24   Lung cancer Mother    Other Father        BRAIN TUMOR   Breast cancer Daughter 60   Breast cancer Daughter 33   Colitis Daughter    Arthritis Other    Hyperlipidemia Other    Lung cancer Other    Prostate cancer Other    Breast cancer Other     Social History:  reports that she has never smoked. She has never used smokeless tobacco. She reports that she does not drink alcohol and does not use drugs.  Allergies:  Allergies  Allergen Reactions   Influenza Vaccines Nausea And Vomiting and Other (See Comments)    dizziness   Penicillins     REACTION: Hives   Sulfamethoxazole Hives   Sulfonamide Derivatives     REACTION: Hives    Prior to Admission medications   Medication Sig Start Date End Date Taking? Authorizing Provider  ALPRAZolam  (XANAX ) 0.5 MG tablet TAKE 1/2 - 1 TABLET ONCE A DAY FOR ANXIETY, SLEEP (LIMIT TO LESS THAN 5 DAYS A WEEK) 03/22/23  Yes Wilkinson, Dana E, NP  buPROPion  (WELLBUTRIN  XL) 300 MG 24 hr tablet TAKE ONE TABLET BY MOUTH EVERY MORNING FOR MOOD, ANXIETY, FOCUS AND CONCENTRATION  09/08/22  Yes Wilkinson, Dana E, NP  Cholecalciferol (VITAMIN D ) 125 MCG (5000 UT) CAPS Take 5,000 Units by mouth daily.   Yes [provider]  furosemide  (LASIX ) 20 MG tablet Take 0.5 tablets (10 mg total) by mouth daily. 11/23/23 02/21/24 Yes Delford Maude BROCKS, MD  gabapentin  (NEURONTIN ) 100 MG capsule TAKE ONE CAPSULE THREE TIMES DAILY. 09/08/22  Yes Wilkinson, Dana E, NP  ipratropium (ATROVENT ) 0.03 % nasal spray Place 2 sprays into the nose 3 (three) times daily. Patient taking differently: Place 2 sprays into the nose daily. 06/21/23 06/20/24 Yes Webb, Padonda B, FNP   metoprolol  tartrate (LOPRESSOR ) 50 MG tablet Take one tablet by mouth as directed prior to CT scan. 11/23/23  Yes Nishan, Peter C, MD  rosuvastatin  (CRESTOR ) 5 MG tablet TAKE ONE TABLET ONCE DAILY FOR CHOLESTEROL 05/17/23  Yes Cranford, Tonya, NP  traZODone  (DESYREL ) 50 MG tablet 1/2-1 tablet for sleep Patient taking differently: Take 25 mg by mouth at bedtime as needed for sleep. 06/15/23  Yes Douglass Caul B, FNP    Blood pressure 117/79, pulse 72, temperature (!) 97.4 F (36.3 C), temperature source Oral, height 5' 6 (1.676 m), weight 120 lb (54.4 kg), SpO2 95%. Exam: General: Communicates without difficulty, well nourished, no acute distress. Head: Normocephalic, no evidence injury, no tenderness, facial buttresses intact without stepoff. Face/sinus: No tenderness to palpation and percussion. Facial movement is normal and symmetric. Eyes: PERRL, EOMI. No scleral icterus, conjunctivae clear. Neuro: CN II exam reveals vision grossly intact.  No nystagmus at any point of gaze. Ears: Auricles well formed without lesions.  Ear canals are intact without mass or lesion.  No erythema or edema is appreciated.  The TMs are intact without fluid. Nose: External evaluation reveals normal support and skin without lesions.  Dorsum is intact.  Anterior rhinoscopy reveals normal mucosa over anterior aspect of inferior turbinates and intact septum.  No purulence noted. Oral:  Oral cavity and oropharynx are intact, symmetric, without erythema or edema.  Mucosa is moist without lesions. Neck: Full range of motion without pain.  There is no significant lymphadenopathy.  No masses palpable.  Thyroid  bed within normal limits to palpation.  Parotid glands and submandibular glands equal bilaterally without mass.  Trachea is midline. Neuro:  CN 2-12 grossly intact.   Assessment and Plan Assessment & Plan Sensorineural hearing loss with associated tinnitus Chronic sensorineural hearing loss with bilateral, constant,  non-pulsatile tinnitus for over four years. No ear infections, ear surgery, or significant noise exposure. Normal ear canals, eardrums, and middle ear space. Tinnitus likely due to age-related high-frequency hearing loss, leading to random neural signals interpreted as tinnitus. - Order audiometry - Advise use of sound machine app for tinnitus management - The strategies to cope with tinnitus, including the use of masker, hearing aids, tinnitus retraining therapy, and avoidance of caffeine and alcohol are discussed. - Discuss hearing aid options with insurance provider - Schedule follow-up appointment in one year        Janell Keeling W Dajuana Palen 01/26/2024, 3:05 PM

## 2024-02-03 ENCOUNTER — Ambulatory Visit

## 2024-02-08 ENCOUNTER — Ambulatory Visit: Admitting: Cardiovascular Disease

## 2024-02-17 ENCOUNTER — Ambulatory Visit

## 2024-02-18 ENCOUNTER — Ambulatory Visit
Admission: RE | Admit: 2024-02-18 | Discharge: 2024-02-18 | Disposition: A | Discharge status: Home / Self Care | Source: Ambulatory Visit | Attending: Nurse Practitioner | Admitting: Nurse Practitioner

## 2024-02-18 DIAGNOSIS — Z1231 Encounter for screening mammogram for malignant neoplasm of breast: Secondary | ICD-10-CM

## 2024-03-01 ENCOUNTER — Ambulatory Visit (INDEPENDENT_AMBULATORY_CARE_PROVIDER_SITE_OTHER): Admitting: Audiology

## 2024-03-02 ENCOUNTER — Encounter: Payer: Self-pay | Admitting: Cardiovascular Disease

## 2024-03-02 ENCOUNTER — Ambulatory Visit: Attending: Cardiovascular Disease | Admitting: Cardiovascular Disease

## 2024-03-02 VITALS — BP 110/62 | HR 72 | Ht 66.0 in | Wt 125.0 lb

## 2024-03-02 DIAGNOSIS — I350 Nonrheumatic aortic (valve) stenosis: Secondary | ICD-10-CM | POA: Diagnosis not present

## 2024-03-02 NOTE — Progress Notes (Unsigned)
 Cardiology Office Note:    Date:  03/03/2024   ID:  Kathryn Chavez, DOB 1950-07-26, MRN [id number]  PCP:  Laurice President, NP   [facility] Providers Cardiologist:  Maude Emmer, MD     Referring MD: Laurice President, NP   Chief Complaint  Patient presents with   Aortic Stenosis    History of Present Illness:    Kathryn Chavez is a 73 y.o. female presenting for follow-up of severe aortic stenosis. She was initially evaluated in August for progressive aortic stenosis, and underwent further testing in preparation for TAVR. Cardiac cath 12/16/23 showed angiographically normal coronary arteries. Echo showed normal LVEF 60-65%, Mean aortic gradient 33 mmHg, calculated AVA 0.86 square cm. CT showed functionally bicuspid aortic valve with aortic valve calcium  score of 1624, annular diameter of 26x24 mm, and area of 430 square mm. The patient was seen by Dr Shyrl and felt to be an appropriate candidate for transfemoral TAVR, also a candidate for surgical bailout if necessary.   The patient is here with her husband today. She reports stable symptoms of fatigue and exertional dyspnea.  She continues to work full-time and is more fatigued at the end of her day.  She has no orthopnea, PND, leg swelling, or chest pain.  No exertional pressure or discomfort in the chest.  She has shortness of breath with moderate level activities but none of her normal daily routine.  No lightheadedness, heart palpitations, or syncope.  No other interval complaints since her initial visit here.  Past Medical History:  Diagnosis Date   Allergy    rhinitis   Aortic stenosis, moderate    on echo in 2015.   Cancer [facility])    breast   Hyperlipidemia    Hypertension    Personal history of chemotherapy 2000   Personal history of radiation therapy 2000   Past Surgical History:  Procedure Laterality Date   ABDOMINAL HYSTERECTOMY     BIOPSY  11/20/2021   Procedure: BIOPSY;  Surgeon: Legrand Victory LITTIE DOUGLAS, MD;   Location: [facility];  Service: Gastroenterology;;   BREAST LUMPECTOMY Right 2000   malignant   COLONOSCOPY WITH PROPOFOL  N/A 11/20/2021   Procedure: COLONOSCOPY WITH PROPOFOL ;  Surgeon: Legrand Victory LITTIE DOUGLAS, MD;  Location: [facility];  Service: Gastroenterology;  Laterality: N/A;   CORONARY ANGIOGRAPHY N/A 12/16/2023   Procedure: CORONARY ANGIOGRAPHY;  Surgeon: Wonda Sharper, MD;  Location: [facility] INVASIVE CV LAB;  Service: Cardiovascular;  Laterality: N/A;   CYSTOCELE REPAIR N/A 05/12/2018   ESOPHAGOGASTRODUODENOSCOPY (EGD) WITH PROPOFOL  N/A 11/20/2021   Procedure: ESOPHAGOGASTRODUODENOSCOPY (EGD) WITH PROPOFOL ;  Surgeon: Legrand Victory LITTIE DOUGLAS, MD;  Location: [facility];  Service: Gastroenterology;  Laterality: N/A;   POLYPECTOMY  11/20/2021   Procedure: POLYPECTOMY;  Surgeon: Legrand Victory LITTIE DOUGLAS, MD;  Location: [facility];  Service: Gastroenterology;;    Current Medications: Current Meds  Medication Sig   ALPRAZolam  (XANAX ) 0.5 MG tablet TAKE 1/2 - 1 TABLET ONCE A DAY FOR ANXIETY, SLEEP (LIMIT TO LESS THAN 5 DAYS A WEEK)   buPROPion  (WELLBUTRIN  XL) 300 MG 24 hr tablet TAKE ONE TABLET BY MOUTH EVERY MORNING FOR MOOD, ANXIETY, FOCUS AND CONCENTRATION   Cholecalciferol (VITAMIN D ) 125 MCG (5000 UT) CAPS Take 5,000 Units by mouth daily.   furosemide  (LASIX ) 20 MG tablet Take 0.5 tablets (10 mg total) by mouth daily.   gabapentin  (NEURONTIN ) 100 MG capsule TAKE ONE CAPSULE THREE TIMES DAILY.   ipratropium (ATROVENT ) 0.03 % nasal spray Place 2  sprays into the nose 3 (three) times daily. (Patient taking differently: Place 2 sprays into the nose daily.)   rosuvastatin  (CRESTOR ) 5 MG tablet TAKE ONE TABLET ONCE DAILY FOR CHOLESTEROL     Allergies:   Influenza vaccines, Penicillins, Sulfamethoxazole, and Sulfonamide derivatives   ROS:   Please see the history of present illness.    All other systems reviewed and are negative.  EKGs/Labs/Other Studies Reviewed:    The following studies were  reviewed today: Cardiac Studies & Procedures   ______________________________________________________________________________________________ CARDIAC CATHETERIZATION  CARDIAC CATHETERIZATION 12/16/2023  Conclusion 1) Widely patent coronary arteries with no significant coronary artery disease (left dominant) 2) Calcified, restricted appearing aortic valve on plain fluoroscopy with known severe aortic stenosis  Recommendations: Continue TAVR evaluation  Findings Coronary Findings Diagnostic  Dominance: Left  Left Main Vessel is angiographically normal. Patent vessel with no significant stenosis  Left Anterior Descending Vessel is small. The LAD is a relatively small caliber vessel.  The LAD is patent with no significant stenosis.  The diagonal branches are patent.  The LAD terminates in the distal anterior wall.  Left Circumflex Vessel is large. The circumflex is super dominant with a large obtuse marginal branch, AV circumflex supplying PDA and PLA branches.  All are patent with no significant stenoses.  First Obtuse Marginal Branch Vessel is large in size.  Right Coronary Artery Vessel is small. Vessel is angiographically normal. Small nondominant RCA with no stenosis  Intervention  No interventions have been documented.   STRESS TESTS  NM PET CT CARDIAC PERFUSION MULTI W/ABSOLUTE BLOODFLOW 12/16/2021  Narrative   LV perfusion is abnormal. There is evidence of ischemia. There is no evidence of infarction. Defect 1: There is a small defect with mild reduction in uptake present in the mid anterolateral location(s) that is reversible. There is normal wall motion in the defect area. Consistent with artifact.   Sub-optimal CT attenuation-misregistration.  Sub optimal stress input curves may be normal depending on Rubium Generator cycle of Rubifil.   Myocardial blood flow was computed to be 0.60ml/g/min at rest and 2.62ml/g/min at stress. Global myocardial blood flow reserve was  2.45 and was normal.   Rest left ventricular function is normal. Rest EF: 62 %. Stress left ventricular function is normal. Stress EF: 50 %. End diastolic cavity size is normal. End systolic cavity size is normal.   Normal LVEF Reserve 12%   Coronary calcium  was present on the attenuation correction CT images. Severe coronary calcifications were present. Coronary calcifications were present in the left anterior descending artery distribution(s).   Given normal MBFR, suspect the study is normal. The study is low risk.   Read by Stanly Leavens MD FASE  CLINICAL DATA:  This over-read does not include interpretation of cardiac or coronary anatomy or pathology. The Cardiac PET CT interpretation by the cardiologist is attached.  COMPARISON:  None Available.  FINDINGS: Vascular: Atherosclerotic calcifications of the aorta. No acute noncardiac vascular finding.  Mediastinum/Nodes: Within the visualized portions of the chest there are no pathologically enlarged mediastinal, hilar or axillary lymph nodes within the limitation of noncontrast enhanced examination. The distal esophagus is grossly unremarkable.  Lungs/Pleura: Within the visualized portions of the chest there are no suspicious pulmonary nodules or masses no focal airspace consolidation, and no pleural effusion or pneumothorax on this motion degraded examination.  Upper Abdomen: No acute abnormality.  Musculoskeletal: No acute osseous abnormality. Multilevel degenerative changes spine.  IMPRESSION: No acute noncardiac finding in the chest.  Aortic Atherosclerosis (ICD10-I70.0).  Electronically Signed By: Reyes Holder M.D. On: 12/16/2021 16:32   ECHOCARDIOGRAM  ECHOCARDIOGRAM COMPLETE 10/29/2023  Narrative ECHOCARDIOGRAM REPORT    Patient Name:   GLYNN FREAS Date of Exam: 10/29/2023 Medical Rec #:  [id number]      Height:       66.0 in Accession #:    [id number]     Weight:       125.2 lb Date of Birth:   Nov 23, 1950      BSA:          1.639 m Patient Age:    73 years       BP:           108/68 mmHg Patient Gender: F              HR:           76 bpm. Exam Location:  [address]  Procedure: 2D Echo, Color Doppler, Cardiac Doppler and 3D Echo (Both Spectral and Color Flow Doppler were utilized during procedure).  Indications:    Aortic stenosis I35.0  History:        Patient has prior history of Echocardiogram examinations, most recent 11/17/2022. Risk Factors:Hypertension and Dyslipidemia.  Sonographer:    Augustin Seals RDCS Referring Phys: [id number] MAUDE JAYSON EMMER  IMPRESSIONS   1. Left ventricular ejection fraction, by estimation, is 60 to 65%. The left ventricle has normal function. The left ventricle has no regional wall motion abnormalities. Left ventricular diastolic parameters were normal. 2. Right ventricular systolic function is normal. The right ventricular size is normal. There is normal pulmonary artery systolic pressure. 3. Aneurysmal fossa with likely PFO. cannot exclude a small PFO. 4. The mitral valve is abnormal. Trivial mitral valve regurgitation. No evidence of mitral stenosis. 5. Compared to TTE done 11/17/22 mean gradient 30-> 33 mmHg peak 48.7-> 48.3 mmHg DVI improved 0.19-> 0.27 and AVA improved 0.96 cm2 . The aortic valve is tricuspid. There is severe calcifcation of the aortic valve. Aortic valve regurgitation is mild. Moderate to severe aortic valve stenosis. 6. The inferior vena cava is normal in size with greater than 50% respiratory variability, suggesting right atrial pressure of 3 mmHg.  FINDINGS Left Ventricle: Left ventricular ejection fraction, by estimation, is 60 to 65%. The left ventricle has normal function. The left ventricle has no regional wall motion abnormalities. Strain was performed and the global longitudinal strain is indeterminate. The left ventricular internal cavity size was normal in size. There is no left ventricular hypertrophy. Left  ventricular diastolic parameters were normal.  Right Ventricle: The right ventricular size is normal. No increase in right ventricular wall thickness. Right ventricular systolic function is normal. There is normal pulmonary artery systolic pressure. The tricuspid regurgitant velocity is 2.26 m/s, and with an assumed right atrial pressure of 3 mmHg, the estimated right ventricular systolic pressure is 23.4 mmHg.  Left Atrium: Left atrial size was normal in size.  Right Atrium: Right atrial size was normal in size.  Pericardium: There is no evidence of pericardial effusion.  Mitral Valve: The mitral valve is abnormal. There is mild thickening of the mitral valve leaflet(s). There is mild calcification of the mitral valve leaflet(s). Trivial mitral valve regurgitation. No evidence of mitral valve stenosis.  Tricuspid Valve: The tricuspid valve is normal in structure. Tricuspid valve regurgitation is mild . No evidence of tricuspid stenosis.  Aortic Valve: Compared to TTE done 11/17/22 mean gradient 30-> 33 mmHg peak 48.7-> 48.3 mmHg DVI improved 0.19-> 0.27 and  AVA improved 0.96 cm2. The aortic valve is tricuspid. There is severe calcifcation of the aortic valve. Aortic valve regurgitation is mild. Aortic regurgitation PHT measures 642 msec. Moderate to severe aortic stenosis is present. Aortic valve mean gradient measures 33.0 mmHg. Aortic valve peak gradient measures 48.3 mmHg. Aortic valve area, by VTI measures 0.86 cm.  Pulmonic Valve: The pulmonic valve was normal in structure. Pulmonic valve regurgitation is not visualized. No evidence of pulmonic stenosis.  Aorta: The aortic root is normal in size and structure.  Venous: The inferior vena cava is normal in size with greater than 50% respiratory variability, suggesting right atrial pressure of 3 mmHg.  IAS/Shunts: Cannot exclude a small PFO.  Additional Comments: 3D was performed not requiring image post processing on an independent  workstation and was indeterminate.   LEFT VENTRICLE PLAX 2D LVIDd:         4.20 cm   Diastology LVIDs:         3.10 cm   LV e' medial:    6.09 cm/s LV PW:         0.90 cm   LV E/e' medial:  12.0 LV IVS:        0.70 cm   LV e' lateral:   7.72 cm/s LVOT diam:     2.00 cm   LV E/e' lateral: 9.5 LV SV:         76 LV SV Index:   46 LVOT Area:     3.14 cm  3D Volume EF: 3D EF:        50 % LV EDV:       109 ml LV ESV:       54 ml LV SV:        55 ml  RIGHT VENTRICLE            IVC RV Basal diam:  3.20 cm    IVC diam: 1.40 cm RV Mid diam:    2.20 cm RV S prime:     9.25 cm/s TAPSE (M-mode): 2.0 cm  LEFT ATRIUM           Index        RIGHT ATRIUM           Index LA diam:      2.70 cm 1.65 cm/m   RA Area:     13.30 cm LA Vol (A2C): 35.5 ml 21.66 ml/m  RA Volume:   32.80 ml  20.01 ml/m LA Vol (A4C): 28.9 ml 17.63 ml/m AORTIC VALVE AV Area (Vmax):    0.96 cm AV Area (Vmean):   0.85 cm AV Area (VTI):     0.86 cm AV Vmax:           347.67 cm/s AV Vmean:          274.000 cm/s AV VTI:            0.885 m AV Peak Grad:      48.3 mmHg AV Mean Grad:      33.0 mmHg LVOT Vmax:         106.00 cm/s LVOT Vmean:        74.400 cm/s LVOT VTI:          0.241 m LVOT/AV VTI ratio: 0.27 AI PHT:            642 msec  AORTA Ao Root diam: 3.40 cm Ao Asc diam:  3.50 cm  MITRAL VALVE  TRICUSPID VALVE MV Area (PHT): 3.27 cm     TR Peak grad:   20.4 mmHg MV Decel Time: 232 msec     TR Vmax:        226.00 cm/s MV E velocity: 73.20 cm/s MV A velocity: 112.00 cm/s  SHUNTS MV E/A ratio:  0.65         Systemic VTI:  0.24 m Systemic Diam: 2.00 cm  Maude Emmer MD Electronically signed by Maude Emmer MD Signature Date/Time: 10/29/2023/9:57:10 AM    Final      CT SCANS  CT CORONARY MORPH W/CTA COR W/SCORE 11/26/2023  Addendum 12/01/2023 11:34 AM ADDENDUM REPORT: 12/01/2023 11:32  EXAM: OVER-READ INTERPRETATION  CT CHEST  The following report is an over-read performed  by radiologist Dr. Mabel Converse of [facility] on 12/01/2023. This over-read does not include interpretation of cardiac or coronary anatomy or pathology. The coronary CTA interpretation by the cardiologist is attached.  COMPARISON:  None.  FINDINGS: Normal heart size. No pericardial effusion. Image thoracic aorta is nonaneurysmal. Central pulmonary vasculature within normal limits.  No adenopathy within the included chest. Imaged lung fields are clear. No acute bony or chest wall abnormality.  IMPRESSION: No acute or significant extracardiac findings.   Electronically Signed By: Mabel Converse D.O. On: 12/01/2023 11:32  Narrative CLINICAL DATA:  29F with severe aortic stenosis being evaluated for a TAVR procedure.  EXAM: Cardiac TAVR CT  TECHNIQUE: A non-contrast, gated CT scan was obtained with axial slices of 2.5 mm through the heart for aortic valve scoring. A 120 kV retrospective, gated, contrast cardiac scan was obtained. Gantry rotation speed was 230 msec and collimation was 0.63 mm. Nitroglycerin  was not given. A delayed scan was obtained to exclude left atrial appendage thrombus. The 3D dataset was reconstructed in systole with motion correction. The 3D data set was reconstructed in 5% intervals of the 0-95% of the R-R cycle. Systolic and diastolic phases were analyzed on a dedicated workstation using MPR, MIP, and VRT modes. The patient received 100 cc of contrast.  FINDINGS: Aortic Root:  Aortic valve: Tricuspid, though functionally bicuspid with partial fusion of left and right cusps  Aortic valve calcium  score: 1624  Aortic annulus:  Diameter: 26mm x 24mm  Perimeter: 76mm  Area: 490mm^2  Calcifications: No calcifications  Coronary height: Min Left - 8mm; Min Right - 19mm  Sinotubular height: Left cusp -17mm ; Right cusp - 25mm; Noncoronary cusp - 24mm  LVOT (as measured 3 mm below the annulus):  Diameter: 25mm x  23mm  Area: 475mm^2  Calcifications: No calcifications  Aortic sinus width: Left cusp - 32mm; Right cusp - 28mm; Noncoronary cusp - 32mm  Sinotubular junction width: 26mm x 25mm  Optimum Fluoroscopic Angle for Delivery: RAO 2 CAU 42  Cardiac:  Right atrium: Mild enlargement  Right ventricle: Normal size  Pulmonary arteries: Normal size  Pulmonary veins: Normal configuration  Left atrium: Normal size.  Interatrial septal aneurysm.  Left ventricle: Normal size  Pericardium: Normal thickness  Coronary arteries: Normal origin.  Calcium  score 220  IMPRESSION: 1. Tricuspid aortic valve, though functionally bicuspid with partial fusion of left and right cusps. Severe aortic valve calcifications (AV calcium  score 1624)  2. Aortic annulus measures 26mm x 24mm in diameter with perimeter 76mm and area 420mm^2. No annular or LVOT calcifications. Annular measurements are right on the border between 23mm and 26mm Edwards Sapien 3 valve. Annular measurements would be appropriate for a 29mm Evolut valve, but sinus of  Valsalva diameter at the right cusp (28mm) is below the recommended minimum (29mm) for a 29mm Evolut valve. Recommend discussion at structural heart conference.  3. Low coronary height to left main (8mm). Sufficient coronary height for RCA  4.  Optimum Fluoroscopic Angle for Delivery:  RAO 2 CAU 42  5.  Coronary calcium  score 220 (75th percentile)  Electronically Signed: By: Lonni Nanas M.D. On: 11/26/2023 14:16     ______________________________________________________________________________________________      EKG:        Recent Labs: 11/22/2023: NT-Pro BNP 694 12/10/2023: ALT 26; BUN 14; Creatinine 0.98; Hemoglobin 11.9; Platelet Count 173; Potassium 4.0; Sodium 140  Recent Lipid Panel    Component Value Date/Time   CHOL 207 (H) 12/15/2022 1644   TRIG 71 12/15/2022 1644   HDL 110 12/15/2022 1644   CHOLHDL 1.9 12/15/2022 1644   VLDL 24  09/09/2016 1017   LDLCALC 82 12/15/2022 1644   LDLDIRECT 73.3 04/30/2011 0954     Risk Assessment/Calculations:                Physical Exam:    VS:  BP 110/62 (BP Location: Left Arm, Patient Position: Sitting, Cuff Size: Small)   Pulse 72   Ht 5' 6 (1.676 m)   Wt 125 lb (56.7 kg)   SpO2 96%   BMI 20.18 kg/m     Wt Readings from Last 3 Encounters:  03/02/24 125 lb (56.7 kg)  01/26/24 120 lb (54.4 kg)  12/24/23 125 lb (56.7 kg)     GEN:  Well nourished, well developed in no acute distress HEENT: Normal NECK: No JVD; No carotid bruits LYMPHATICS: No lymphadenopathy CARDIAC: RRR, 2/6 harsh crescendo decrescendo murmur heard throughout, loudest at the RUSB RESPIRATORY:  Clear to auscultation without rales, wheezing or rhonchi  ABDOMEN: Soft, non-tender, non-distended MUSCULOSKELETAL:  No edema; No deformity  SKIN: Warm and dry NEUROLOGIC:  Alert and oriented x 3 PSYCHIATRIC:  Normal affect   Assessment & Plan Nonrheumatic aortic (valve) stenosis 73 yo woman with severe, stage D1, symptomatic aortic stenosis.  This is associated with an NYHA functional class II limitation related to fatigue and exertional dyspnea.  All of her studies are reviewed as outlined above. The aortic valve is severely calcified and restricted by echo assessment with calculated AVA 0.86 square cm, cath shows widely patent coronary arteries, and CTA studies show aortic valve calcium  score of 1624 with sizing appropriate for a 23 mm Sapien 3 valve. Pt has food peripheral access by CTA for transfemoral TAVR. She has been evaluated by Dr Shyrl with cardiac surgery and is felt to be an appropriate candidate for TAVR as documented in his surgical consultation note.   Following the decision to proceed with transcatheter aortic valve replacement, a discussion has been held regarding what types of management strategies would be attempted intraoperatively in the event of life-threatening complications,  including whether or not the patient would be considered a candidate for the use of cardiopulmonary bypass and/or conversion to open sternotomy for attempted surgical intervention.  Per review of the surgical consultation, the patient would be a candidate for full surgical bailout if indicated. The patient has been advised of a variety of complications that might develop including but not limited to risks of death, stroke, paravalvular leak, aortic dissection or other major vascular complications, aortic annulus rupture, device embolization, cardiac rupture or perforation, mitral regurgitation, acute myocardial infarction, arrhythmia, heart block or bradycardia requiring permanent pacemaker placement, congestive heart failure, respiratory failure, renal  failure, pneumonia, infection, other late complications related to structural valve deterioration or migration, or other complications that might ultimately cause a temporary or permanent loss of functional independence or other long term morbidity.  The patient provides full informed consent for the procedure as described and all questions were answered.      Medication Adjustments/Labs and Tests Ordered: Current medicines are reviewed at length with the patient today.  Concerns regarding medicines are outlined above.  No orders of the defined types were placed in this encounter.  No orders of the defined types were placed in this encounter.   Patient Instructions  Medication Instructions:  Your physician recommends that you continue on your current medications as directed. Please refer to the Current Medication list given to you today.  *If you need a refill on your cardiac medications before your next appointment, please call your pharmacy*  Lab Work: none If you have labs (blood work) drawn today and your tests are completely normal, you will receive your results only by: MyChart Message (if you have MyChart) OR A paper copy in the mail If  you have any lab test that is abnormal or we need to change your treatment, we will call you to review the results.  Testing/Procedures: none  Follow-Up: At [facility], you and your health needs are our priority.  As part of our continuing mission to provide you with exceptional heart care, our providers are all part of one team.  This team includes your primary Cardiologist (physician) and Advanced Practice Providers or APPs (Physician Assistants and Nurse Practitioners) who all work together to provide you with the care you need, when you need it.  Your next appointment:   To be arranged after procedure  Provider:   Ozell Fell, MD    We recommend signing up for the patient portal called MyChart.  Sign up information is provided on this After Visit Summary.  MyChart is used to connect with patients for Virtual Visits (Telemedicine).  Patients are able to view lab/test results, encounter notes, upcoming appointments, etc.  Non-urgent messages can be sent to your provider as well.   To learn more about what you can do with MyChart, go to [email or web address].   Other Instructions            Signed, Ozell Fell, MD  03/03/2024 5:33 PM    [facility]

## 2024-03-02 NOTE — Patient Instructions (Signed)
 Medication Instructions:  Your physician recommends that you continue on your current medications as directed. Please refer to the Current Medication list given to you today.  *If you need a refill on your cardiac medications before your next appointment, please call your pharmacy*  Lab Work: none If you have labs (blood work) drawn today and your tests are completely normal, you will receive your results only by: MyChart Message (if you have MyChart) OR A paper copy in the mail If you have any lab test that is abnormal or we need to change your treatment, we will call you to review the results.  Testing/Procedures: none  Follow-Up: At [facility], you and your health needs are our priority.  As part of our continuing mission to provide you with exceptional heart care, our providers are all part of one team.  This team includes your primary Cardiologist (physician) and Advanced Practice Providers or APPs (Physician Assistants and Nurse Practitioners) who all work together to provide you with the care you need, when you need it.  Your next appointment:   To be arranged after procedure  Provider:   Ozell Fell, MD    We recommend signing up for the patient portal called MyChart.  Sign up information is provided on this After Visit Summary.  MyChart is used to connect with patients for Virtual Visits (Telemedicine).  Patients are able to view lab/test results, encounter notes, upcoming appointments, etc.  Non-urgent messages can be sent to your provider as well.   To learn more about what you can do with MyChart, go to [email or web address].   Other Instructions

## 2024-03-02 NOTE — Progress Notes (Unsigned)
 Pre Surgical Assessment: 5 M Walk Test  60M=16.33ft  5 Meter Walk Test- trial 1: 4.14 seconds 5 Meter Walk Test- trial 2: 4.18 seconds 5 Meter Walk Test- trial 3: 4.40 seconds 5 Meter Walk Test Average: 4.24 seconds

## 2024-03-02 NOTE — H&P (View-Only) (Signed)
 Cardiology Office Note:    Date:  03/03/2024   ID:  ZYNIAH FERRAIOLO, DOB 01/24/1951, MRN [id number]  PCP:  Laurice President, NP   [facility] Providers Cardiologist:  Maude Emmer, MD     Referring MD: Laurice President, NP   Chief Complaint  Patient presents with   Aortic Stenosis    History of Present Illness:    Kathryn Chavez is a 73 y.o. female presenting for follow-up of severe aortic stenosis. She was initially evaluated in August for progressive aortic stenosis, and underwent further testing in preparation for TAVR. Cardiac cath 12/16/23 showed angiographically normal coronary arteries. Echo showed normal LVEF 60-65%, Mean aortic gradient 33 mmHg, calculated AVA 0.86 square cm. CT showed functionally bicuspid aortic valve with aortic valve calcium  score of 1624, annular diameter of 26x24 mm, and area of 430 square mm. The patient was seen by Dr Shyrl and felt to be an appropriate candidate for transfemoral TAVR, also a candidate for surgical bailout if necessary.   The patient is here with her husband today. She reports stable symptoms of fatigue and exertional dyspnea.  She continues to work full-time and is more fatigued at the end of her day.  She has no orthopnea, PND, leg swelling, or chest pain.  No exertional pressure or discomfort in the chest.  She has shortness of breath with moderate level activities but none of her normal daily routine.  No lightheadedness, heart palpitations, or syncope.  No other interval complaints since her initial visit here.  Past Medical History:  Diagnosis Date   Allergy    rhinitis   Aortic stenosis, moderate    on echo in 2015.   Cancer [facility])    breast   Hyperlipidemia    Hypertension    Personal history of chemotherapy 2000   Personal history of radiation therapy 2000   Past Surgical History:  Procedure Laterality Date   ABDOMINAL HYSTERECTOMY     BIOPSY  11/20/2021   Procedure: BIOPSY;  Surgeon: Legrand Victory LITTIE DOUGLAS, MD;   Location: [facility];  Service: Gastroenterology;;   BREAST LUMPECTOMY Right 2000   malignant   COLONOSCOPY WITH PROPOFOL  N/A 11/20/2021   Procedure: COLONOSCOPY WITH PROPOFOL ;  Surgeon: Legrand Victory LITTIE DOUGLAS, MD;  Location: [facility];  Service: Gastroenterology;  Laterality: N/A;   CORONARY ANGIOGRAPHY N/A 12/16/2023   Procedure: CORONARY ANGIOGRAPHY;  Surgeon: Wonda Sharper, MD;  Location: [facility] INVASIVE CV LAB;  Service: Cardiovascular;  Laterality: N/A;   CYSTOCELE REPAIR N/A 05/12/2018   ESOPHAGOGASTRODUODENOSCOPY (EGD) WITH PROPOFOL  N/A 11/20/2021   Procedure: ESOPHAGOGASTRODUODENOSCOPY (EGD) WITH PROPOFOL ;  Surgeon: Legrand Victory LITTIE DOUGLAS, MD;  Location: [facility];  Service: Gastroenterology;  Laterality: N/A;   POLYPECTOMY  11/20/2021   Procedure: POLYPECTOMY;  Surgeon: Legrand Victory LITTIE DOUGLAS, MD;  Location: [facility];  Service: Gastroenterology;;    Current Medications: Current Meds  Medication Sig   ALPRAZolam  (XANAX ) 0.5 MG tablet TAKE 1/2 - 1 TABLET ONCE A DAY FOR ANXIETY, SLEEP (LIMIT TO LESS THAN 5 DAYS A WEEK)   buPROPion  (WELLBUTRIN  XL) 300 MG 24 hr tablet TAKE ONE TABLET BY MOUTH EVERY MORNING FOR MOOD, ANXIETY, FOCUS AND CONCENTRATION   Cholecalciferol (VITAMIN D ) 125 MCG (5000 UT) CAPS Take 5,000 Units by mouth daily.   furosemide  (LASIX ) 20 MG tablet Take 0.5 tablets (10 mg total) by mouth daily.   gabapentin  (NEURONTIN ) 100 MG capsule TAKE ONE CAPSULE THREE TIMES DAILY.   ipratropium (ATROVENT ) 0.03 % nasal spray Place 2  sprays into the nose 3 (three) times daily. (Patient taking differently: Place 2 sprays into the nose daily.)   rosuvastatin  (CRESTOR ) 5 MG tablet TAKE ONE TABLET ONCE DAILY FOR CHOLESTEROL     Allergies:   Influenza vaccines, Penicillins, Sulfamethoxazole, and Sulfonamide derivatives   ROS:   Please see the history of present illness.    All other systems reviewed and are negative.  EKGs/Labs/Other Studies Reviewed:    The following studies were  reviewed today: Cardiac Studies & Procedures   ______________________________________________________________________________________________ CARDIAC CATHETERIZATION  CARDIAC CATHETERIZATION 12/16/2023  Conclusion 1) Widely patent coronary arteries with no significant coronary artery disease (left dominant) 2) Calcified, restricted appearing aortic valve on plain fluoroscopy with known severe aortic stenosis  Recommendations: Continue TAVR evaluation  Findings Coronary Findings Diagnostic  Dominance: Left  Left Main Vessel is angiographically normal. Patent vessel with no significant stenosis  Left Anterior Descending Vessel is small. The LAD is a relatively small caliber vessel.  The LAD is patent with no significant stenosis.  The diagonal branches are patent.  The LAD terminates in the distal anterior wall.  Left Circumflex Vessel is large. The circumflex is super dominant with a large obtuse marginal branch, AV circumflex supplying PDA and PLA branches.  All are patent with no significant stenoses.  First Obtuse Marginal Branch Vessel is large in size.  Right Coronary Artery Vessel is small. Vessel is angiographically normal. Small nondominant RCA with no stenosis  Intervention  No interventions have been documented.   STRESS TESTS  NM PET CT CARDIAC PERFUSION MULTI W/ABSOLUTE BLOODFLOW 12/16/2021  Narrative   LV perfusion is abnormal. There is evidence of ischemia. There is no evidence of infarction. Defect 1: There is a small defect with mild reduction in uptake present in the mid anterolateral location(s) that is reversible. There is normal wall motion in the defect area. Consistent with artifact.   Sub-optimal CT attenuation-misregistration.  Sub optimal stress input curves may be normal depending on Rubium Generator cycle of Rubifil.   Myocardial blood flow was computed to be 0.54ml/g/min at rest and 2.16ml/g/min at stress. Global myocardial blood flow reserve was  2.45 and was normal.   Rest left ventricular function is normal. Rest EF: 62 %. Stress left ventricular function is normal. Stress EF: 50 %. End diastolic cavity size is normal. End systolic cavity size is normal.   Normal LVEF Reserve 12%   Coronary calcium  was present on the attenuation correction CT images. Severe coronary calcifications were present. Coronary calcifications were present in the left anterior descending artery distribution(s).   Given normal MBFR, suspect the study is normal. The study is low risk.   Read by Stanly Leavens MD FASE  CLINICAL DATA:  This over-read does not include interpretation of cardiac or coronary anatomy or pathology. The Cardiac PET CT interpretation by the cardiologist is attached.  COMPARISON:  None Available.  FINDINGS: Vascular: Atherosclerotic calcifications of the aorta. No acute noncardiac vascular finding.  Mediastinum/Nodes: Within the visualized portions of the chest there are no pathologically enlarged mediastinal, hilar or axillary lymph nodes within the limitation of noncontrast enhanced examination. The distal esophagus is grossly unremarkable.  Lungs/Pleura: Within the visualized portions of the chest there are no suspicious pulmonary nodules or masses no focal airspace consolidation, and no pleural effusion or pneumothorax on this motion degraded examination.  Upper Abdomen: No acute abnormality.  Musculoskeletal: No acute osseous abnormality. Multilevel degenerative changes spine.  IMPRESSION: No acute noncardiac finding in the chest.  Aortic Atherosclerosis (ICD10-I70.0).  Electronically Signed By: Reyes Holder M.D. On: 12/16/2021 16:32   ECHOCARDIOGRAM  ECHOCARDIOGRAM COMPLETE 10/29/2023  Narrative ECHOCARDIOGRAM REPORT    Patient Name:   ANDRIEA HASEGAWA Date of Exam: 10/29/2023 Medical Rec #:  [id number]      Height:       66.0 in Accession #:    [id number]     Weight:       125.2 lb Date of Birth:   [birth year]      BSA:          1.639 m Patient Age:    73 years       BP:           108/68 mmHg Patient Gender: F              HR:           76 bpm. Exam Location:  [address]  Procedure: 2D Echo, Color Doppler, Cardiac Doppler and 3D Echo (Both Spectral and Color Flow Doppler were utilized during procedure).  Indications:    Aortic stenosis I35.0  History:        Patient has prior history of Echocardiogram examinations, most recent 11/17/2022. Risk Factors:Hypertension and Dyslipidemia.  Sonographer:    Augustin Seals RDCS Referring Phys: [id number] MAUDE JAYSON EMMER  IMPRESSIONS   1. Left ventricular ejection fraction, by estimation, is 60 to 65%. The left ventricle has normal function. The left ventricle has no regional wall motion abnormalities. Left ventricular diastolic parameters were normal. 2. Right ventricular systolic function is normal. The right ventricular size is normal. There is normal pulmonary artery systolic pressure. 3. Aneurysmal fossa with likely PFO. cannot exclude a small PFO. 4. The mitral valve is abnormal. Trivial mitral valve regurgitation. No evidence of mitral stenosis. 5. Compared to TTE done 11/17/22 mean gradient 30-> 33 mmHg peak 48.7-> 48.3 mmHg DVI improved 0.19-> 0.27 and AVA improved 0.96 cm2 . The aortic valve is tricuspid. There is severe calcifcation of the aortic valve. Aortic valve regurgitation is mild. Moderate to severe aortic valve stenosis. 6. The inferior vena cava is normal in size with greater than 50% respiratory variability, suggesting right atrial pressure of 3 mmHg.  FINDINGS Left Ventricle: Left ventricular ejection fraction, by estimation, is 60 to 65%. The left ventricle has normal function. The left ventricle has no regional wall motion abnormalities. Strain was performed and the global longitudinal strain is indeterminate. The left ventricular internal cavity size was normal in size. There is no left ventricular hypertrophy. Left  ventricular diastolic parameters were normal.  Right Ventricle: The right ventricular size is normal. No increase in right ventricular wall thickness. Right ventricular systolic function is normal. There is normal pulmonary artery systolic pressure. The tricuspid regurgitant velocity is 2.26 m/s, and with an assumed right atrial pressure of 3 mmHg, the estimated right ventricular systolic pressure is 23.4 mmHg.  Left Atrium: Left atrial size was normal in size.  Right Atrium: Right atrial size was normal in size.  Pericardium: There is no evidence of pericardial effusion.  Mitral Valve: The mitral valve is abnormal. There is mild thickening of the mitral valve leaflet(s). There is mild calcification of the mitral valve leaflet(s). Trivial mitral valve regurgitation. No evidence of mitral valve stenosis.  Tricuspid Valve: The tricuspid valve is normal in structure. Tricuspid valve regurgitation is mild . No evidence of tricuspid stenosis.  Aortic Valve: Compared to TTE done 11/17/22 mean gradient 30-> 33 mmHg peak 48.7-> 48.3 mmHg DVI improved 0.19-> 0.27 and  AVA improved 0.96 cm2. The aortic valve is tricuspid. There is severe calcifcation of the aortic valve. Aortic valve regurgitation is mild. Aortic regurgitation PHT measures 642 msec. Moderate to severe aortic stenosis is present. Aortic valve mean gradient measures 33.0 mmHg. Aortic valve peak gradient measures 48.3 mmHg. Aortic valve area, by VTI measures 0.86 cm.  Pulmonic Valve: The pulmonic valve was normal in structure. Pulmonic valve regurgitation is not visualized. No evidence of pulmonic stenosis.  Aorta: The aortic root is normal in size and structure.  Venous: The inferior vena cava is normal in size with greater than 50% respiratory variability, suggesting right atrial pressure of 3 mmHg.  IAS/Shunts: Cannot exclude a small PFO.  Additional Comments: 3D was performed not requiring image post processing on an independent  workstation and was indeterminate.   LEFT VENTRICLE PLAX 2D LVIDd:         4.20 cm   Diastology LVIDs:         3.10 cm   LV e' medial:    6.09 cm/s LV PW:         0.90 cm   LV E/e' medial:  12.0 LV IVS:        0.70 cm   LV e' lateral:   7.72 cm/s LVOT diam:     2.00 cm   LV E/e' lateral: 9.5 LV SV:         76 LV SV Index:   46 LVOT Area:     3.14 cm  3D Volume EF: 3D EF:        50 % LV EDV:       109 ml LV ESV:       54 ml LV SV:        55 ml  RIGHT VENTRICLE            IVC RV Basal diam:  3.20 cm    IVC diam: 1.40 cm RV Mid diam:    2.20 cm RV S prime:     9.25 cm/s TAPSE (M-mode): 2.0 cm  LEFT ATRIUM           Index        RIGHT ATRIUM           Index LA diam:      2.70 cm 1.65 cm/m   RA Area:     13.30 cm LA Vol (A2C): 35.5 ml 21.66 ml/m  RA Volume:   32.80 ml  20.01 ml/m LA Vol (A4C): 28.9 ml 17.63 ml/m AORTIC VALVE AV Area (Vmax):    0.96 cm AV Area (Vmean):   0.85 cm AV Area (VTI):     0.86 cm AV Vmax:           347.67 cm/s AV Vmean:          274.000 cm/s AV VTI:            0.885 m AV Peak Grad:      48.3 mmHg AV Mean Grad:      33.0 mmHg LVOT Vmax:         106.00 cm/s LVOT Vmean:        74.400 cm/s LVOT VTI:          0.241 m LVOT/AV VTI ratio: 0.27 AI PHT:            642 msec  AORTA Ao Root diam: 3.40 cm Ao Asc diam:  3.50 cm  MITRAL VALVE  TRICUSPID VALVE MV Area (PHT): 3.27 cm     TR Peak grad:   20.4 mmHg MV Decel Time: 232 msec     TR Vmax:        226.00 cm/s MV E velocity: 73.20 cm/s MV A velocity: 112.00 cm/s  SHUNTS MV E/A ratio:  0.65         Systemic VTI:  0.24 m Systemic Diam: 2.00 cm  Maude Emmer MD Electronically signed by Maude Emmer MD Signature Date/Time: 10/29/2023/9:57:10 AM    Final      CT SCANS  CT CORONARY MORPH W/CTA COR W/SCORE 11/26/2023  Addendum 12/01/2023 11:34 AM ADDENDUM REPORT: 12/01/2023 11:32  EXAM: OVER-READ INTERPRETATION  CT CHEST  The following report is an over-read performed  by radiologist Dr. Mabel Converse of [facility] on 12/01/2023. This over-read does not include interpretation of cardiac or coronary anatomy or pathology. The coronary CTA interpretation by the cardiologist is attached.  COMPARISON:  None.  FINDINGS: Normal heart size. No pericardial effusion. Image thoracic aorta is nonaneurysmal. Central pulmonary vasculature within normal limits.  No adenopathy within the included chest. Imaged lung fields are clear. No acute bony or chest wall abnormality.  IMPRESSION: No acute or significant extracardiac findings.   Electronically Signed By: Mabel Converse D.O. On: 12/01/2023 11:32  Narrative CLINICAL DATA:  50F with severe aortic stenosis being evaluated for a TAVR procedure.  EXAM: Cardiac TAVR CT  TECHNIQUE: A non-contrast, gated CT scan was obtained with axial slices of 2.5 mm through the heart for aortic valve scoring. A 120 kV retrospective, gated, contrast cardiac scan was obtained. Gantry rotation speed was 230 msec and collimation was 0.63 mm. Nitroglycerin  was not given. A delayed scan was obtained to exclude left atrial appendage thrombus. The 3D dataset was reconstructed in systole with motion correction. The 3D data set was reconstructed in 5% intervals of the 0-95% of the R-R cycle. Systolic and diastolic phases were analyzed on a dedicated workstation using MPR, MIP, and VRT modes. The patient received 100 cc of contrast.  FINDINGS: Aortic Root:  Aortic valve: Tricuspid, though functionally bicuspid with partial fusion of left and right cusps  Aortic valve calcium  score: 1624  Aortic annulus:  Diameter: 26mm x 24mm  Perimeter: 76mm  Area: 467mm^2  Calcifications: No calcifications  Coronary height: Min Left - 8mm; Min Right - 19mm  Sinotubular height: Left cusp -17mm ; Right cusp - 25mm; Noncoronary cusp - 24mm  LVOT (as measured 3 mm below the annulus):  Diameter: 25mm x  23mm  Area: 480mm^2  Calcifications: No calcifications  Aortic sinus width: Left cusp - 32mm; Right cusp - 28mm; Noncoronary cusp - 32mm  Sinotubular junction width: 26mm x 25mm  Optimum Fluoroscopic Angle for Delivery: RAO 2 CAU 42  Cardiac:  Right atrium: Mild enlargement  Right ventricle: Normal size  Pulmonary arteries: Normal size  Pulmonary veins: Normal configuration  Left atrium: Normal size.  Interatrial septal aneurysm.  Left ventricle: Normal size  Pericardium: Normal thickness  Coronary arteries: Normal origin.  Calcium  score 220  IMPRESSION: 1. Tricuspid aortic valve, though functionally bicuspid with partial fusion of left and right cusps. Severe aortic valve calcifications (AV calcium  score 1624)  2. Aortic annulus measures 26mm x 24mm in diameter with perimeter 76mm and area 424mm^2. No annular or LVOT calcifications. Annular measurements are right on the border between 23mm and 26mm Edwards Sapien 3 valve. Annular measurements would be appropriate for a 29mm Evolut valve, but sinus of  Valsalva diameter at the right cusp (28mm) is below the recommended minimum (29mm) for a 29mm Evolut valve. Recommend discussion at structural heart conference.  3. Low coronary height to left main (8mm). Sufficient coronary height for RCA  4.  Optimum Fluoroscopic Angle for Delivery:  RAO 2 CAU 42  5.  Coronary calcium  score 220 (75th percentile)  Electronically Signed: By: Lonni Nanas M.D. On: 11/26/2023 14:16     ______________________________________________________________________________________________      EKG:        Recent Labs: 11/22/2023: NT-Pro BNP 694 12/10/2023: ALT 26; BUN 14; Creatinine 0.98; Hemoglobin 11.9; Platelet Count 173; Potassium 4.0; Sodium 140  Recent Lipid Panel    Component Value Date/Time   CHOL 207 (H) 12/15/2022 1644   TRIG 71 12/15/2022 1644   HDL 110 12/15/2022 1644   CHOLHDL 1.9 12/15/2022 1644   VLDL 24  09/09/2016 1017   LDLCALC 82 12/15/2022 1644   LDLDIRECT 73.3 04/30/2011 0954     Risk Assessment/Calculations:                Physical Exam:    VS:  BP 110/62 (BP Location: Left Arm, Patient Position: Sitting, Cuff Size: Small)   Pulse 72   Ht 5' 6 (1.676 m)   Wt 125 lb (56.7 kg)   SpO2 96%   BMI 20.18 kg/m     Wt Readings from Last 3 Encounters:  03/02/24 125 lb (56.7 kg)  01/26/24 120 lb (54.4 kg)  12/24/23 125 lb (56.7 kg)     GEN:  Well nourished, well developed in no acute distress HEENT: Normal NECK: No JVD; No carotid bruits LYMPHATICS: No lymphadenopathy CARDIAC: RRR, 2/6 harsh crescendo decrescendo murmur heard throughout, loudest at the RUSB RESPIRATORY:  Clear to auscultation without rales, wheezing or rhonchi  ABDOMEN: Soft, non-tender, non-distended MUSCULOSKELETAL:  No edema; No deformity  SKIN: Warm and dry NEUROLOGIC:  Alert and oriented x 3 PSYCHIATRIC:  Normal affect   Assessment & Plan Nonrheumatic aortic (valve) stenosis 73 yo woman with severe, stage D1, symptomatic aortic stenosis.  This is associated with an NYHA functional class II limitation related to fatigue and exertional dyspnea.  All of her studies are reviewed as outlined above. The aortic valve is severely calcified and restricted by echo assessment with calculated AVA 0.86 square cm, cath shows widely patent coronary arteries, and CTA studies show aortic valve calcium  score of 1624 with sizing appropriate for a 23 mm Sapien 3 valve. Pt has food peripheral access by CTA for transfemoral TAVR. She has been evaluated by Dr Shyrl with cardiac surgery and is felt to be an appropriate candidate for TAVR as documented in his surgical consultation note.   Following the decision to proceed with transcatheter aortic valve replacement, a discussion has been held regarding what types of management strategies would be attempted intraoperatively in the event of life-threatening complications,  including whether or not the patient would be considered a candidate for the use of cardiopulmonary bypass and/or conversion to open sternotomy for attempted surgical intervention.  Per review of the surgical consultation, the patient would be a candidate for full surgical bailout if indicated. The patient has been advised of a variety of complications that might develop including but not limited to risks of death, stroke, paravalvular leak, aortic dissection or other major vascular complications, aortic annulus rupture, device embolization, cardiac rupture or perforation, mitral regurgitation, acute myocardial infarction, arrhythmia, heart block or bradycardia requiring permanent pacemaker placement, congestive heart failure, respiratory failure, renal  failure, pneumonia, infection, other late complications related to structural valve deterioration or migration, or other complications that might ultimately cause a temporary or permanent loss of functional independence or other long term morbidity.  The patient provides full informed consent for the procedure as described and all questions were answered.      Medication Adjustments/Labs and Tests Ordered: Current medicines are reviewed at length with the patient today.  Concerns regarding medicines are outlined above.  No orders of the defined types were placed in this encounter.  No orders of the defined types were placed in this encounter.   Patient Instructions  Medication Instructions:  Your physician recommends that you continue on your current medications as directed. Please refer to the Current Medication list given to you today.  *If you need a refill on your cardiac medications before your next appointment, please call your pharmacy*  Lab Work: none If you have labs (blood work) drawn today and your tests are completely normal, you will receive your results only by: MyChart Message (if you have MyChart) OR A paper copy in the mail If  you have any lab test that is abnormal or we need to change your treatment, we will call you to review the results.  Testing/Procedures: none  Follow-Up: At [facility], you and your health needs are our priority.  As part of our continuing mission to provide you with exceptional heart care, our providers are all part of one team.  This team includes your primary Cardiologist (physician) and Advanced Practice Providers or APPs (Physician Assistants and Nurse Practitioners) who all work together to provide you with the care you need, when you need it.  Your next appointment:   To be arranged after procedure  Provider:   Ozell Fell, MD    We recommend signing up for the patient portal called MyChart.  Sign up information is provided on this After Visit Summary.  MyChart is used to connect with patients for Virtual Visits (Telemedicine).  Patients are able to view lab/test results, encounter notes, upcoming appointments, etc.  Non-urgent messages can be sent to your provider as well.   To learn more about what you can do with MyChart, go to [email or web address].   Other Instructions            Signed, Ozell Fell, MD  03/03/2024 5:33 PM    [facility]

## 2024-03-08 ENCOUNTER — Other Ambulatory Visit: Payer: Self-pay

## 2024-03-08 DIAGNOSIS — I350 Nonrheumatic aortic (valve) stenosis: Secondary | ICD-10-CM

## 2024-03-17 ENCOUNTER — Other Ambulatory Visit: Payer: Self-pay

## 2024-03-17 ENCOUNTER — Ambulatory Visit (HOSPITAL_COMMUNITY)
Admission: RE | Admit: 2024-03-17 | Discharge: 2024-03-17 | Disposition: A | Discharge status: Home / Self Care | Source: Ambulatory Visit | Attending: Cardiovascular Disease | Admitting: Cardiovascular Disease

## 2024-03-17 ENCOUNTER — Encounter (HOSPITAL_COMMUNITY)
Admission: RE | Admit: 2024-03-17 | Discharge: 2024-03-17 | Disposition: A | Discharge status: Home / Self Care | Source: Ambulatory Visit | Attending: Cardiovascular Disease | Admitting: Cardiovascular Disease

## 2024-03-17 DIAGNOSIS — Z01818 Encounter for other preprocedural examination: Secondary | ICD-10-CM | POA: Insufficient documentation

## 2024-03-17 DIAGNOSIS — I350 Nonrheumatic aortic (valve) stenosis: Secondary | ICD-10-CM | POA: Diagnosis present

## 2024-03-17 LAB — URINALYSIS, ROUTINE W REFLEX MICROSCOPIC
Bacteria, UA: NONE SEEN
Bilirubin Urine: NEGATIVE
Glucose, UA: NEGATIVE mg/dL
Hgb urine dipstick: NEGATIVE
Ketones, ur: NEGATIVE mg/dL
Nitrite: NEGATIVE
Protein, ur: NEGATIVE mg/dL
Specific Gravity, Urine: 1.004 — ABNORMAL LOW (ref 1.005–1.030)
pH: 7 (ref 5.0–8.0)

## 2024-03-17 LAB — COMPREHENSIVE METABOLIC PANEL WITH GFR
ALT: 25 U/L (ref 0–44)
AST: 34 U/L (ref 15–41)
Albumin: 4 g/dL (ref 3.5–5.0)
Alkaline Phosphatase: 119 U/L (ref 38–126)
Anion gap: 13 (ref 5–15)
BUN: 14 mg/dL (ref 8–23)
CO2: 26 mmol/L (ref 22–32)
Calcium: 9.3 mg/dL (ref 8.9–10.3)
Chloride: 102 mmol/L (ref 98–111)
Creatinine, Ser: 1 mg/dL (ref 0.44–1.00)
GFR, Estimated: 59 mL/min — ABNORMAL LOW (ref >60)
Glucose, Bld: 105 mg/dL — ABNORMAL HIGH (ref 70–99)
Potassium: 3.9 mmol/L (ref 3.5–5.1)
Sodium: 141 mmol/L (ref 135–145)
Total Bilirubin: 0.8 mg/dL (ref 0.0–1.2)
Total Protein: 6 g/dL — ABNORMAL LOW (ref 6.5–8.1)

## 2024-03-17 LAB — PROTIME-INR
INR: 0.9 (ref 0.8–1.2)
Prothrombin Time: 12.9 s (ref 11.4–15.2)

## 2024-03-17 LAB — TYPE AND SCREEN
ABO/RH(D): A POS
Antibody Screen: NEGATIVE

## 2024-03-17 LAB — CBC
HCT: 39.1 % (ref 36.0–46.0)
Hemoglobin: 12.8 g/dL (ref 12.0–15.0)
MCH: 31.7 pg (ref 26.0–34.0)
MCHC: 32.7 g/dL (ref 30.0–36.0)
MCV: 96.8 fL (ref 80.0–100.0)
Platelets: 161 K/uL (ref 150–400)
RBC: 4.04 MIL/uL (ref 3.87–5.11)
RDW: 12.3 % (ref 11.5–15.5)
WBC: 5.1 K/uL (ref 4.0–10.5)
nRBC: 0 % (ref 0.0–0.2)

## 2024-03-17 LAB — SURGICAL PCR SCREEN
MRSA, PCR: NEGATIVE
Staphylococcus aureus: NEGATIVE

## 2024-03-17 NOTE — Progress Notes (Signed)
 All consents signed by patient at PAT lab appointment. Pt was sent home with printed copy of surgical instructions and CHG soap/CHG soap instructions. All instructions reviewed with patient and questions answered.  Patients chart send to anesthesia for review. Pt denies any respiratory illness/infection in the last two months.

## 2024-03-20 MED ORDER — HEPARIN 30,000 UNITS/1000 ML (OHS) CELLSAVER SOLUTION
Status: DC
  Filled 2024-03-20: qty 1000

## 2024-03-20 MED ORDER — NOREPINEPHRINE 4 MG/250ML-% IV SOLN
0.0000 ug/min | INTRAVENOUS | Status: AC
  Administered 2024-03-21: 4 ug/min via INTRAVENOUS
  Filled 2024-03-20: qty 250

## 2024-03-20 MED ORDER — POTASSIUM CHLORIDE 2 MEQ/ML IV SOLN
80.0000 meq | INTRAVENOUS | Status: DC
  Filled 2024-03-20: qty 40

## 2024-03-20 MED ORDER — CEFAZOLIN SODIUM-DEXTROSE 2-4 GM/100ML-% IV SOLN
2.0000 g | INTRAVENOUS | Status: AC
  Administered 2024-03-21: 2 g via INTRAVENOUS
  Filled 2024-03-20: qty 100

## 2024-03-20 MED ORDER — MAGNESIUM SULFATE 50 % IJ SOLN
40.0000 meq | INTRAMUSCULAR | Status: DC
  Filled 2024-03-20: qty 9.85

## 2024-03-20 MED ORDER — DEXMEDETOMIDINE HCL IN NACL 400 MCG/100ML IV SOLN
0.1000 ug/kg/h | INTRAVENOUS | Status: AC
  Administered 2024-03-21: 56.72 ug via INTRAVENOUS
  Administered 2024-03-21: 1 ug/kg/h via INTRAVENOUS
  Filled 2024-03-20: qty 100

## 2024-03-21 ENCOUNTER — Inpatient Hospital Stay (HOSPITAL_COMMUNITY): Payer: Self-pay | Admitting: Anesthesiology

## 2024-03-21 ENCOUNTER — Inpatient Hospital Stay (HOSPITAL_COMMUNITY): Payer: Self-pay | Admitting: Vascular Surgery

## 2024-03-21 ENCOUNTER — Other Ambulatory Visit: Payer: Self-pay

## 2024-03-21 ENCOUNTER — Inpatient Hospital Stay (HOSPITAL_COMMUNITY)
Admission: RE | Admit: 2024-03-21 | Discharge: 2024-03-22 | DRG: 267 | Disposition: A | Attending: Cardiovascular Disease | Admitting: Cardiovascular Disease

## 2024-03-21 ENCOUNTER — Encounter (HOSPITAL_COMMUNITY): Payer: Self-pay | Admitting: Cardiovascular Disease

## 2024-03-21 ENCOUNTER — Inpatient Hospital Stay (HOSPITAL_COMMUNITY)

## 2024-03-21 ENCOUNTER — Encounter (HOSPITAL_COMMUNITY): Admission: RE | Disposition: A | Payer: Self-pay | Source: Home / Self Care | Attending: Cardiovascular Disease

## 2024-03-21 DIAGNOSIS — I350 Nonrheumatic aortic (valve) stenosis: Secondary | ICD-10-CM

## 2024-03-21 DIAGNOSIS — Z9221 Personal history of antineoplastic chemotherapy: Secondary | ICD-10-CM

## 2024-03-21 DIAGNOSIS — Z882 Allergy status to sulfonamides status: Secondary | ICD-10-CM

## 2024-03-21 DIAGNOSIS — Z952 Presence of prosthetic heart valve: Principal | ICD-10-CM

## 2024-03-21 DIAGNOSIS — E785 Hyperlipidemia, unspecified: Secondary | ICD-10-CM | POA: Diagnosis present

## 2024-03-21 DIAGNOSIS — I10 Essential (primary) hypertension: Secondary | ICD-10-CM | POA: Diagnosis present

## 2024-03-21 DIAGNOSIS — Z880 Allergy status to penicillin: Secondary | ICD-10-CM

## 2024-03-21 DIAGNOSIS — Z887 Allergy status to serum and vaccine status: Secondary | ICD-10-CM

## 2024-03-21 DIAGNOSIS — R161 Splenomegaly, not elsewhere classified: Secondary | ICD-10-CM | POA: Diagnosis present

## 2024-03-21 DIAGNOSIS — E039 Hypothyroidism, unspecified: Secondary | ICD-10-CM | POA: Diagnosis present

## 2024-03-21 DIAGNOSIS — Z853 Personal history of malignant neoplasm of breast: Secondary | ICD-10-CM

## 2024-03-21 DIAGNOSIS — Z923 Personal history of irradiation: Secondary | ICD-10-CM

## 2024-03-21 DIAGNOSIS — Z006 Encounter for examination for normal comparison and control in clinical research program: Secondary | ICD-10-CM

## 2024-03-21 DIAGNOSIS — E46 Unspecified protein-calorie malnutrition: Secondary | ICD-10-CM | POA: Diagnosis present

## 2024-03-21 DIAGNOSIS — F419 Anxiety disorder, unspecified: Secondary | ICD-10-CM | POA: Diagnosis not present

## 2024-03-21 DIAGNOSIS — J449 Chronic obstructive pulmonary disease, unspecified: Secondary | ICD-10-CM

## 2024-03-21 DIAGNOSIS — I447 Left bundle-branch block, unspecified: Secondary | ICD-10-CM | POA: Insufficient documentation

## 2024-03-21 HISTORY — PX: INTRAOPERATIVE TRANSTHORACIC ECHOCARDIOGRAM: SHX6523

## 2024-03-21 HISTORY — DX: Malignant neoplasm of unspecified site of unspecified female breast: C50.919

## 2024-03-21 HISTORY — DX: Nonrheumatic aortic (valve) stenosis: I35.0

## 2024-03-21 LAB — POCT I-STAT, CHEM 8
BUN: 16 mg/dL (ref 8–23)
Calcium, Ion: 1.21 mmol/L (ref 1.15–1.40)
Chloride: 110 mmol/L (ref 98–111)
Creatinine, Ser: 0.8 mg/dL (ref 0.44–1.00)
Glucose, Bld: 109 mg/dL — ABNORMAL HIGH (ref 70–99)
HCT: 28 % — ABNORMAL LOW (ref 36.0–46.0)
Hemoglobin: 9.5 g/dL — ABNORMAL LOW (ref 12.0–15.0)
Potassium: 3.4 mmol/L — ABNORMAL LOW (ref 3.5–5.1)
Sodium: 141 mmol/L (ref 135–145)
TCO2: 22 mmol/L (ref 22–32)

## 2024-03-21 LAB — ECHOCARDIOGRAM LIMITED
AR max vel: 1.07 cm2
AV Area VTI: 0.99 cm2
AV Area mean vel: 0.99 cm2
AV Mean grad: 21 mmHg
AV Peak grad: 25 mmHg
Ao pk vel: 2.5 m/s
P 1/2 time: 370 ms
S' Lateral: 2.7 cm

## 2024-03-21 LAB — ABO/RH: ABO/RH(D): A POS

## 2024-03-21 LAB — POCT ACTIVATED CLOTTING TIME: Activated Clotting Time: 307 s

## 2024-03-21 MED ORDER — CHLORHEXIDINE GLUCONATE 4 % EX SOLN: 30.0000 mL | CUTANEOUS | Status: DC

## 2024-03-21 MED ORDER — PROPOFOL 500 MG/50ML IV EMUL
INTRAVENOUS | Status: DC | PRN
  Administered 2024-03-21: 35 ug/kg/min via INTRAVENOUS

## 2024-03-21 MED ORDER — IODIXANOL 320 MG/ML IV SOLN
INTRAVENOUS | Status: DC | PRN
  Administered 2024-03-21: 40 mL via INTRA_ARTERIAL

## 2024-03-21 MED ORDER — NOREPINEPHRINE 4 MG/250ML-% IV SOLN
0.0000 ug/min | INTRAVENOUS | Status: DC
  Filled 2024-03-21: qty 250

## 2024-03-21 MED ORDER — HEPARIN (PORCINE) IN NACL 1000-0.9 UT/500ML-% IV SOLN
INTRAVENOUS | Status: DC | PRN
  Administered 2024-03-21: 500 mL

## 2024-03-21 MED ORDER — SODIUM CHLORIDE 0.9 % IV SOLN: INTRAVENOUS | Status: DC

## 2024-03-21 MED ORDER — PROTAMINE SULFATE 10 MG/ML IV SOLN
INTRAVENOUS | Status: DC | PRN
  Administered 2024-03-21: 10 mg via INTRAVENOUS
  Administered 2024-03-21: 40 mg via INTRAVENOUS

## 2024-03-21 MED ORDER — FENTANYL CITRATE (PF) 100 MCG/2ML IJ SOLN
INTRAMUSCULAR | Status: AC
  Filled 2024-03-21: qty 2

## 2024-03-21 MED ORDER — CHLORHEXIDINE GLUCONATE 0.12 % MT SOLN
15.0000 mL | Freq: Once | OROMUCOSAL | Status: AC
  Administered 2024-03-21: 15 mL via OROMUCOSAL
  Filled 2024-03-21: qty 15

## 2024-03-21 MED ORDER — SODIUM CHLORIDE 0.9 % IV SOLN: INTRAVENOUS | Status: AC

## 2024-03-21 MED ORDER — ACETAMINOPHEN 325 MG PO TABS: 650.0000 mg | ORAL_TABLET | Freq: Four times a day (QID) | ORAL | Status: DC | PRN

## 2024-03-21 MED ORDER — CEFAZOLIN SODIUM-DEXTROSE 2-4 GM/100ML-% IV SOLN
2.0000 g | Freq: Three times a day (TID) | INTRAVENOUS | Status: AC
  Administered 2024-03-21 – 2024-03-22 (×2): 2 g via INTRAVENOUS
  Filled 2024-03-21 (×2): qty 100

## 2024-03-21 MED ORDER — CHLORHEXIDINE GLUCONATE 4 % EX SOLN: 60.0000 mL | Freq: Once | CUTANEOUS | Status: DC

## 2024-03-21 MED ORDER — ACETAMINOPHEN 500 MG PO TABS
1000.0000 mg | ORAL_TABLET | Freq: Once | ORAL | Status: AC
  Administered 2024-03-21: 1000 mg via ORAL
  Filled 2024-03-21: qty 2

## 2024-03-21 MED ORDER — FENTANYL CITRATE (PF) 100 MCG/2ML IJ SOLN
INTRAMUSCULAR | Status: DC | PRN
  Administered 2024-03-21: 50 ug via INTRAVENOUS
  Administered 2024-03-21: 25 ug via INTRAVENOUS

## 2024-03-21 MED ORDER — OXYCODONE HCL 5 MG PO TABS: 5.0000 mg | ORAL_TABLET | ORAL | Status: DC | PRN

## 2024-03-21 MED ORDER — ALPRAZOLAM 0.25 MG PO TABS
0.2500 mg | ORAL_TABLET | Freq: Every evening | ORAL | Status: DC | PRN
  Administered 2024-03-22: 0.25 mg via ORAL
  Filled 2024-03-21 (×2): qty 1

## 2024-03-21 MED ORDER — ACETAMINOPHEN 650 MG RE SUPP: 650.0000 mg | Freq: Four times a day (QID) | RECTAL | Status: DC | PRN

## 2024-03-21 MED ORDER — TRAMADOL HCL 50 MG PO TABS: 50.0000 mg | ORAL_TABLET | ORAL | Status: DC | PRN

## 2024-03-21 MED ORDER — BUPROPION HCL ER (XL) 300 MG PO TB24
300.0000 mg | ORAL_TABLET | Freq: Every day | ORAL | Status: DC
  Administered 2024-03-21 – 2024-03-22 (×2): 300 mg via ORAL
  Filled 2024-03-21 (×2): qty 1

## 2024-03-21 MED ORDER — ONDANSETRON HCL 4 MG/2ML IJ SOLN: 4.0000 mg | Freq: Four times a day (QID) | INTRAMUSCULAR | Status: DC | PRN

## 2024-03-21 MED ORDER — SODIUM CHLORIDE 0.9 % IV SOLN: 250.0000 mL | INTRAVENOUS | Status: DC | PRN

## 2024-03-21 MED ORDER — GABAPENTIN 100 MG PO CAPS
100.0000 mg | ORAL_CAPSULE | Freq: Three times a day (TID) | ORAL | Status: DC
  Administered 2024-03-21 – 2024-03-22 (×3): 100 mg via ORAL
  Filled 2024-03-21 (×3): qty 1

## 2024-03-21 MED ORDER — HEPARIN SODIUM (PORCINE) 1000 UNIT/ML IJ SOLN
INTRAMUSCULAR | Status: DC | PRN
  Administered 2024-03-21: 9000 [IU] via INTRAVENOUS

## 2024-03-21 MED ORDER — SODIUM CHLORIDE 0.9% FLUSH
3.0000 mL | Freq: Two times a day (BID) | INTRAVENOUS | Status: DC
  Administered 2024-03-21 – 2024-03-22 (×2): 3 mL via INTRAVENOUS

## 2024-03-21 MED ORDER — ROSUVASTATIN CALCIUM 5 MG PO TABS
5.0000 mg | ORAL_TABLET | Freq: Every day | ORAL | Status: DC
  Administered 2024-03-21 – 2024-03-22 (×2): 5 mg via ORAL
  Filled 2024-03-21 (×2): qty 1

## 2024-03-21 MED ORDER — HEPARIN (PORCINE) IN NACL 2000-0.9 UNIT/L-% IV SOLN
INTRAVENOUS | Status: DC | PRN
  Administered 2024-03-21: 1000 mL

## 2024-03-21 MED ORDER — MORPHINE SULFATE (PF) 2 MG/ML IV SOLN: 1.0000 mg | INTRAVENOUS | Status: DC | PRN

## 2024-03-21 MED ORDER — SODIUM CHLORIDE 0.9% FLUSH: 3.0000 mL | INTRAVENOUS | Status: DC | PRN

## 2024-03-21 MED ORDER — NITROGLYCERIN IN D5W 200-5 MCG/ML-% IV SOLN: 0.0000 ug/min | INTRAVENOUS | Status: DC

## 2024-03-21 MED ORDER — LIDOCAINE HCL (PF) 1 % IJ SOLN
INTRAMUSCULAR | Status: AC
  Filled 2024-03-21: qty 30

## 2024-03-21 MED ORDER — ASPIRIN 81 MG PO TBEC
81.0000 mg | DELAYED_RELEASE_TABLET | Freq: Every day | ORAL | Status: DC
  Administered 2024-03-21 – 2024-03-22 (×2): 81 mg via ORAL
  Filled 2024-03-21 (×2): qty 1

## 2024-03-21 MED ORDER — LIDOCAINE HCL (PF) 1 % IJ SOLN
INTRAMUSCULAR | Status: DC | PRN
  Administered 2024-03-21 (×2): 15 mL

## 2024-03-21 MED ORDER — LACTATED RINGERS IV SOLN: INTRAVENOUS | Status: DC

## 2024-03-21 NOTE — Op Note (Signed)
 [facility]   MULTIDISCIPLINARY HEART VALVE TEAM   TAVR OPERATIVE NOTE   Date of Procedure:  03/21/2024  Preoperative Diagnosis: Severe Aortic Stenosis   Postoperative Diagnosis: Same   Procedure:   Transcatheter Aortic Valve Replacement - Percutaneous Transfemoral Approach  Edwards Sapien 3 Ultra Resilia THV (size 23 mm, serial # [id number])   Co-Surgeons:  Con Clunes, MD and Ozell Fell, MD  Anesthesiologist:  FORBES Kelly Mace, MD  Echocardiographer:  Maude Emmer, MD  Pre-operative Echo Findings: Severe aortic stenosis Normal left ventricular systolic function  Post-operative Echo Findings: Trace paravalvular leak Normal/unchanged left ventricular systolic function  BRIEF CLINICAL NOTE AND INDICATIONS FOR SURGERY  73 year old woman with NYHA functional class II symptoms of progressive fatigue and mild exertional dyspnea.  She is found to have severe aortic stenosis with calculated aortic valve area 0.86 cm, functionally bicuspid valve with aortic valve calcium  score of 1624, and preserved LVEF of 60 to 65%.  She underwent multimodality preop imaging studies and cardiac catheterization demonstrating no significant CAD and suitable anatomy for transfemoral TAVR.  During the course of the patient's preoperative work up they have been evaluated comprehensively by a multidisciplinary team of specialists coordinated through the Multidisciplinary Heart Valve Clinic in the [facility].  They have been demonstrated to suffer from symptomatic severe aortic stenosis as noted above. The patient has been counseled extensively as to the relative risks and benefits of all options for the treatment of severe aortic stenosis including long term medical therapy, conventional surgery for aortic valve replacement, and transcatheter aortic valve replacement.  The patient has been independently evaluated in formal cardiac surgical consultation by Dr  Shyrl, who deemed the patient appropriate for TAVR. Based upon review of all of the patient's preoperative diagnostic tests they are felt to be candidate for transcatheter aortic valve replacement using the transfemoral approach as an alternative to conventional surgery.    Following the decision to proceed with transcatheter aortic valve replacement, a discussion has been held regarding what types of management strategies would be attempted intraoperatively in the event of life-threatening complications, including whether or not the patient would be considered a candidate for the use of cardiopulmonary bypass and/or conversion to open sternotomy for attempted surgical intervention.  The patient has been advised of a variety of complications that might develop peculiar to this approach including but not limited to risks of death, stroke, paravalvular leak, aortic dissection or other major vascular complications, aortic annulus rupture, device embolization, cardiac rupture or perforation, acute myocardial infarction, arrhythmia, heart block or bradycardia requiring permanent pacemaker placement, congestive heart failure, respiratory failure, renal failure, pneumonia, infection, other late complications related to structural valve deterioration or migration, or other complications that might ultimately cause a temporary or permanent loss of functional independence or other long term morbidity.  The patient provides full informed consent for the procedure as described and all questions were answered preoperatively.  DETAILS OF THE OPERATIVE PROCEDURE  PREPARATION:   The patient is brought to the operating room on the above mentioned date and central monitoring was established by the anesthesia team. The patient is placed in the supine position on the operating table.  Intravenous antibiotics are administered. The patient is monitored closely throughout the procedure under conscious sedation.  Baseline  transthoracic echocardiogram is performed. The patient's chest, abdomen, both groins, and both lower extremities are prepared and draped in a sterile manner. A time out procedure is performed.   PERIPHERAL ACCESS:  Using ultrasound guidance, femoral arterial and venous access is obtained with placement of 6 Fr sheaths on the left side.  US  images are digitally captured and stored in the patient's chart. A pigtail diagnostic catheter was passed through the femoral arterial sheath under fluoroscopic guidance into the aortic root.  A temporary transvenous pacemaker catheter was passed through the femoral venous sheath under fluoroscopic guidance into the right ventricle.  The pacemaker was tested to ensure stable lead placement and pacemaker capture. Aortic root angiography was performed in order to determine the optimal angiographic angle for valve deployment.  TRANSFEMORAL ACCESS:  A micropuncture technique is used to access the right femoral artery under fluoroscopic and ultrasound guidance.  2 Perclose devices are deployed at 10' and 2' positions to 'PreClose' the femoral artery. An 8 French sheath is placed and then an Amplatz Superstiff wire is advanced through the sheath. This is changed out for a 14 French transfemoral E-Sheath after progressively dilating over the Superstiff wire.  An AL-1 catheter was used to direct a straight-tip exchange length wire across the native aortic valve into the left ventricle. This was exchanged out for a pigtail catheter and position was confirmed in the LV apex. Simultaneous LV and Ao pressures were recorded.  The pigtail catheter was exchanged for a Safari wire in the LV apex.    BALLOON AORTIC VALVULOPLASTY:  Not performed  TRANSCATHETER HEART VALVE DEPLOYMENT:  An Edwards Sapien 3 Ultra Resilia transcatheter heart valve (size 23 mm +1 additional cc volume) was prepared and crimped per manufacturer's guidelines, and the proper orientation of the valve is  confirmed on the [employer] delivery system. The valve was advanced through the introducer sheath using normal technique until in an appropriate position in the abdominal aorta beyond the sheath tip. The balloon was then retracted and using the fine-tuning wheel was centered on the valve. The valve was then advanced across the aortic arch using appropriate flexion of the catheter. The valve was carefully positioned across the aortic valve annulus. The Commander catheter was retracted using normal technique. Once final position of the valve has been confirmed by angiographic assessment, the valve is deployed while temporarily holding ventilation and during rapid ventricular pacing to maintain systolic blood pressure < 50 mmHg and pulse pressure < 10 mmHg. The balloon inflation is held for >3 seconds after reaching full deployment volume. Once the balloon has fully deflated the balloon is retracted into the ascending aorta and valve function is assessed using echocardiography. The patient's hemodynamic recovery following valve deployment is good.  The deployment balloon and guidewire are both removed. Echo demostrated acceptable post-procedural gradients, stable mitral valve function, and trace aortic insufficiency.    PROCEDURE COMPLETION:  The sheath was removed and femoral artery closure is performed using the 2 previously deployed Perclose devices.  Protamine  is administered once femoral arterial repair was complete. The site is clear with no evidence of bleeding or hematoma after the sutures are tightened. The temporary pacemaker and pigtail catheters are removed. Mynx closure is used for contralateral femoral arterial hemostasis for the 6 Fr sheath.  The patient tolerated the procedure well and is transported to the recovery area in stable condition. There were no immediate intraoperative complications. All sponge instrument and needle counts are verified correct at completion of the operation.    The patient received a total of 40 mL of intravenous contrast during the procedure.  EBL: minimal  LVEDP: 16 mmHg   Ozell Fell, MD 03/21/2024 11:33 AM

## 2024-03-21 NOTE — Progress Notes (Signed)
 Brief Progress Note  Doing well. No new fevers, chills, cough or ER visits.  Continues to have shortness of breath.  Vitals:   03/21/24 0707  BP: 119/79  Pulse: 85  Resp: 18  Temp: 97.9 F (36.6 C)  SpO2: 98%   Plan: Proceed with transfemoral TAVR today  Con Clunes, MD Cardiothoracic Surgery Pager: [phone]

## 2024-03-21 NOTE — Op Note (Signed)
 [facility]   MULTIDISCIPLINARY HEART VALVE TEAM     TAVR OPERATIVE NOTE     Date of Procedure:                03/21/2024   Preoperative Diagnosis:      Severe Aortic Stenosis    Postoperative Diagnosis:    Same    Procedure:        Transcatheter Aortic Valve Replacement - Percutaneous Transfemoral Approach             Edwards Sapien 3 Ultra Resilia THV (size 23 mm + 1 cc additional volume, serial # [id number])              Co-Surgeons:                        Con Clunes, MD and Ozell Fell, MD   Anesthesiologist:                  FORBES Kelly Mace, MD   Echocardiographer:              Maude Emmer, MD   Pre-operative Echo Findings: Severe aortic stenosis Normal left ventricular systolic function   Post-operative Echo Findings: Trace paravalvular leak Normal/unchanged left ventricular systolic function   BRIEF CLINICAL NOTE AND INDICATIONS FOR SURGERY   73 year old woman with NYHA functional class II symptoms of progressive fatigue and mild exertional dyspnea.  She is found to have severe aortic stenosis with calculated aortic valve area 0.86 cm, functionally bicuspid valve with aortic valve calcium  score of 1624, and preserved LVEF of 60 to 65%.  She underwent multimodality preop imaging studies and cardiac catheterization demonstrating no significant CAD and suitable anatomy for transfemoral TAVR.  DETAILS OF THE OPERATIVE PROCEDURE   PREPARATION:   The patient is brought to the operating room on the above mentioned date and central monitoring was established by the anesthesia team. The patient is placed in the supine position on the operating table.  Intravenous antibiotics are administered. The patient is monitored closely throughout the procedure under conscious sedation.   Baseline transthoracic echocardiogram is performed. The patient's chest, abdomen, both groins, and both lower extremities are prepared and draped in a sterile manner. A time out  procedure is performed.     PERIPHERAL ACCESS:   Using ultrasound guidance, femoral arterial and venous access is obtained with placement of 6 Fr sheaths on the left side.  US  images are digitally captured and stored in the patient's chart. A pigtail diagnostic catheter was passed through the femoral arterial sheath under fluoroscopic guidance into the aortic root.  A temporary transvenous pacemaker catheter was passed through the femoral venous sheath under fluoroscopic guidance into the right ventricle.  The pacemaker was tested to ensure stable lead placement and pacemaker capture. Aortic root angiography was performed in order to determine the optimal angiographic angle for valve deployment.   TRANSFEMORAL ACCESS:  A micropuncture technique is used to access the right femoral artery under fluoroscopic and ultrasound guidance.  2 Perclose devices are deployed at 10' and 2' positions to 'PreClose' the femoral artery. An 8 French sheath is placed and then an Amplatz Superstiff wire is advanced through the sheath. This is changed out for a 14 French transfemoral E-Sheath after progressively dilating over the Superstiff wire.  An AL-1 catheter was used to direct a straight-tip exchange length wire across the native aortic valve into the left ventricle. This was exchanged out for  a pigtail catheter and position was confirmed in the LV apex. Simultaneous LV and Ao pressures were recorded.  The pigtail catheter was exchanged for a Safari wire in the LV apex.     BALLOON AORTIC VALVULOPLASTY:  Not performed   TRANSCATHETER HEART VALVE DEPLOYMENT:  An Edwards Sapien 3 Ultra Resilia transcatheter heart valve (size 23 mm +1 additional cc volume) was prepared and crimped per manufacturer's guidelines, and the proper orientation of the valve is confirmed on the [employer] delivery system. The valve was advanced through the introducer sheath using normal technique until in an appropriate position in the  abdominal aorta beyond the sheath tip. The balloon was then retracted and using the fine-tuning wheel was centered on the valve. The valve was then advanced across the aortic arch using appropriate flexion of the catheter. The valve was carefully positioned across the aortic valve annulus. The Commander catheter was retracted using normal technique. Once final position of the valve has been confirmed by angiographic assessment, the valve is deployed while temporarily holding ventilation and during rapid ventricular pacing to maintain systolic blood pressure < 50 mmHg and pulse pressure < 10 mmHg. The balloon inflation is held for >3 seconds after reaching full deployment volume. Once the balloon has fully deflated the balloon is retracted into the ascending aorta and valve function is assessed using echocardiography. The patient's hemodynamic recovery following valve deployment is good.  The deployment balloon and guidewire are both removed. Echo demostrated acceptable post-procedural gradients, stable mitral valve function, and trace aortic insufficiency.      PROCEDURE COMPLETION:  The sheath was removed and femoral artery closure is performed using the 2 previously deployed Perclose devices.  Protamine is administered once femoral arterial repair was complete. The site is clear with no evidence of bleeding or hematoma after the sutures are tightened. The temporary pacemaker and pigtail catheters are removed. Mynx closure is used for contralateral femoral arterial hemostasis for the 6 Fr sheath.   The patient tolerated the procedure well and is transported to the recovery area in stable condition. There were no immediate intraoperative complications. All sponge instrument and needle counts are verified correct at completion of the operation.    The patient received a total of 40 mL of intravenous contrast during the procedure.   EBL: minimal   LVEDP: 16 mmHg    Con Clunes, MD Cardiothoracic Surgery

## 2024-03-21 NOTE — Interval H&P Note (Signed)
 History and Physical Interval Note:  03/21/2024 11:33 AM  Kathryn Chavez  has presented today for surgery, with the diagnosis of Severe Aortic Stenosis.  The various methods of treatment have been discussed with the patient and family. After consideration of risks, benefits and other options for treatment, the patient has consented to  Procedure(s): Transcatheter Aortic Valve Replacement, Transfemoral (N/A) ECHOCARDIOGRAM, TRANSTHORACIC (N/A) as a surgical intervention.  The patient's history has been reviewed, patient examined, no change in status, stable for surgery.  I have reviewed the patient's chart and labs.  Questions were answered to the patient's satisfaction.     Ozell Fell

## 2024-03-21 NOTE — Progress Notes (Signed)
  [facility]   MULTIDISCIPLINARY HEART VALVE TEAM  Patient doing well s/p TAVR. She is hemodynamically stable. Groin sites stable. ECG with new LBBB but no high grade block. Transferred from cath lab holding to 4E.Early ambulation after bedrest completed and hopeful discharge over the next 24-48 hours.   Lamarr Hummer PA-C  [facility]  Pager [phone]

## 2024-03-21 NOTE — Plan of Care (Signed)

## 2024-03-21 NOTE — Transfer of Care (Signed)
 Immediate Anesthesia Transfer of Care Note  Patient: Kathryn Chavez  Procedure(s) Performed: Transcatheter Aortic Valve Replacement, Transfemoral ECHOCARDIOGRAM, TRANSTHORACIC  Patient Location: Cath Lab  Anesthesia Type:MAC  Level of Consciousness: awake, alert , oriented, and patient cooperative  Airway & Oxygen Therapy: Patient Spontanous Breathing and Patient connected to face mask oxygen  Post-op Assessment: Report given to RN, Post -op Vital signs reviewed and stable, Patient moving all extremities, and Patient moving all extremities X 4  Post vital signs: Reviewed and stable  Last Vitals:  Vitals Value Taken Time  BP 109/55 03/21/24 11:15  Temp    Pulse 65 03/21/24 11:16  Resp 14 03/21/24 11:16  SpO2 100 % 03/21/24 11:16  Vitals shown include unfiled device data.  Last Pain:  Vitals:   03/21/24 0736  TempSrc:   PainSc: 0-No pain         Complications: There were no known notable events for this encounter.

## 2024-03-21 NOTE — Anesthesia Preprocedure Evaluation (Addendum)
 Anesthesia Evaluation  Patient identified by MRN, date of birth, ID band Patient awake    Reviewed: Allergy & Precautions, NPO status , Patient's Chart, lab work & pertinent test results  History of Anesthesia Complications Negative for: history of anesthetic complications  Airway Mallampati: II  TM Distance: >3 FB Neck ROM: Full    Dental  (+) Dental Advisory Given   Pulmonary COPD,  COPD inhaler   breath sounds clear to auscultation       Cardiovascular hypertension, Pt. on medications (-) angina (-) CAD + Valvular Problems/Murmurs AS  Rhythm:Regular Rate:Normal + Systolic murmurs 04/7972 Cath: no sig stenosis  10/2023 ECHO: EF 60 to 65%. 1. The LV has normal function, no regional wall motion abnormalities. Left ventricular diastolic parameters were normal.   2. RVF is normal. The right ventricular size is normal. There is normal pulmonary artery systolic pressure.   3. Aneurysmal fossa with likely PFO. cannot exclude a small PFO.   4. The mitral valve is abnormal. Trivial mitral valve regurgitation. No evidence of mitral stenosis.   5. Compared to TTE done 11/17/22 mean gradient 30-> 33 mmHg peak 48.7->  48.3 mmHg DVI improved 0.19-> 0.27 and AVA improved 0.96 cm2 . The aortic  valve is tricuspid. There is severe calcifcation of the aortic valve. Aortic valve regurgitation is mild. Moderate to severe aortic valve stenosis.     Neuro/Psych negative neurological ROS     GI/Hepatic negative GI ROS, Neg liver ROS,,,  Endo/Other  Hypothyroidism    Renal/GU negative Renal ROS     Musculoskeletal   Abdominal   Peds  Hematology Hb 12.8, plt 161k   Anesthesia Other Findings H/o breast cancer  Reproductive/Obstetrics                              Anesthesia Physical Anesthesia Plan  ASA: 4  Anesthesia Plan: MAC   Post-op Pain Management: Tylenol  PO (pre-op)* and Minimal or no pain anticipated    Induction:   PONV Risk Score and Plan: 2 and Ondansetron   Airway Management Planned: Natural Airway and Simple Face Mask  Additional Equipment: None  Intra-op Plan:   Post-operative Plan:   Informed Consent: I have reviewed the patients History and Physical, chart, labs and discussed the procedure including the risks, benefits and alternatives for the proposed anesthesia with the patient or authorized representative who has indicated his/her understanding and acceptance.     Dental advisory given  Plan Discussed with: CRNA and Surgeon  Anesthesia Plan Comments:          Anesthesia Quick Evaluation

## 2024-03-21 NOTE — Anesthesia Postprocedure Evaluation (Signed)
 Anesthesia Post Note  Patient: Kathryn Chavez  Procedure(s) Performed: Transcatheter Aortic Valve Replacement, Transfemoral ECHOCARDIOGRAM, TRANSTHORACIC     Patient location during evaluation: Cath Lab Anesthesia Type: MAC Level of consciousness: awake and alert, oriented and patient cooperative Pain management: pain level controlled Vital Signs Assessment: post-procedure vital signs reviewed and stable Respiratory status: spontaneous breathing, nonlabored ventilation and respiratory function stable Cardiovascular status: blood pressure returned to baseline and stable Postop Assessment: no apparent nausea or vomiting Anesthetic complications: no   There were no known notable events for this encounter.  Last Vitals:  Vitals:   03/21/24 1200 03/21/24 1215  BP: 128/61 (!) 118/56  Pulse: (!) 56 (!) 59  Resp: 15 11  Temp: (!) 36 C   SpO2: 93% 96%    Last Pain:  Vitals:   03/21/24 1200  TempSrc: Temporal  PainSc: 0-No pain                 Osborne Serio,E. Dashun Borre

## 2024-03-21 NOTE — Progress Notes (Signed)
 Patient arrived in the unit, V/S obtained, CHG bath given, B/L femoral sites C/D/I, all needs met, call bell in reach.   03/21/24 1341  Vitals  Temp 97.6 F (36.4 C)  Temp Source Oral  BP 125/79  MAP (mmHg) 92  BP Location Right Leg  BP Method Automatic  Patient Position (if appropriate) Lying  Pulse Rate 64  Pulse Rate Source Monitor  ECG Heart Rate 69  Resp 14  Level of Consciousness  Level of Consciousness Alert  MEWS COLOR  MEWS Score Color Green  Oxygen Therapy  SpO2 99 %  O2 Device Room Air  Pain Assessment  Pain Scale 0-10  Pain Score 0  MEWS Score  MEWS Temp 0  MEWS Systolic 0  MEWS Pulse 0  MEWS RR 0  MEWS LOC 0  MEWS Score 0

## 2024-03-21 NOTE — Discharge Instructions (Signed)

## 2024-03-21 NOTE — Discharge Summary (Incomplete)
 [facility]   MULTIDISCIPLINARY HEART VALVE TEAM  Discharge Summary    Patient ID: Kathryn Chavez MRN: [id number]; DOB: May 13, 1950  Admit date: 03/21/2024 Discharge date: 03/22/2024  PCP:  Laurice President, NP  [facility] Cardiologist:  Maude Emmer, MD  [facility] Structural heart: Ozell Fell, MD [facility] Electrophysiologist:  None   Discharge Diagnoses    Principal Problem:   S/P TAVR (transcatheter aortic valve replacement) Active Problems:   Essential hypertension   Hyperlipidemia   Severe aortic stenosis   Hypothyroidism   LBBB (left bundle branch block)   Allergies Allergies  Allergen Reactions   Influenza Vaccines Nausea And Vomiting and Other (See Comments)    dizziness   Penicillins Hives   Sulfamethoxazole Hives   Sulfonamide Derivatives Hives    Diagnostic Studies/Procedures    TAVR OPERATIVE NOTE     Date of Procedure:                03/21/2024   Preoperative Diagnosis:      Severe Aortic Stenosis    Postoperative Diagnosis:    Same    Procedure:        Transcatheter Aortic Valve Replacement - Percutaneous Transfemoral Approach             Edwards Sapien 3 Ultra Resilia THV (size 23 mm, serial # [id number])              Co-Surgeons:                        Con Clunes, MD and Ozell Fell, MD   Anesthesiologist:                  FORBES Kelly Mace, MD   Echocardiographer:              Maude Emmer, MD   Pre-operative Echo Findings: Severe aortic stenosis Normal left ventricular systolic function   Post-operative Echo Findings: Trace paravalvular leak Normal/unchanged left ventricular systolic function  _____________    Echo 03/22/24: completed but pending formal read at the time of discharge   History of Present Illness     Kathryn Chavez is a 73 y.o. female with a history of HTN, HLD, IRBBB, breast cancer s/p chemo/XRT and severe aortic stenosis who presented to [facility] on 03/21/24 for planned TAVR.   Echo  10/29/23 showed normal EF 60%, mean grad 33 mmHg, peak grad 48.3 mmHg, AVA 0.96 cm2, DVI 0.27, SVI 46, mild AI. Cardiac cath 12/16/23 showed angiographically normal coronary arteries. She reported fatigue and exertional dyspnea.   The patient was evaluated by the multidisciplinary valve team and felt to have severe, symptomatic aortic stenosis and to be a suitable candidate for TAVR, which was set up for 03/21/24.  Hospital Course     Consultants: none   Severe AS:  -- S/p successful TAVR with a 23 mm Edwards Sapien 3 Ultra Resilia THV via the TF approach on 03/21/24.  -- Post operative echo completed but pending formal read. -- Groin sites are stable.  -- Continue Asprin 81mg  daily.  -- Met with cardiac rehab to discuss CRP phase II.  -- Plan for discharge home today with close follow up in the outpatient setting.   New LBBB: -- Pt developed a new LBBB s/p TAVR.  -- Will discharge with a Zio AT to rule out delayed HAVB.  HTN: -- BP has been elevated but has had anxiety in hospital. -- Continue lasix   10mg  daily.  -- She will keep a log of her BP to bring to the office next week and medication will be indicated if indicated.   Splenomegaly: -- Pre TAVR CTs showed moderate splenomegaly with estimated volume of approximately 715 mL. -- This is followed by Dr Deanne managing.   _____________  Discharge Vitals Blood pressure (!) 148/84, pulse 87, temperature 98.8 F (37.1 C), temperature source Oral, resp. rate 14, height 5' 6 (1.676 m), weight 57.2 kg, SpO2 99%.  Filed Weights   03/20/24 0700 03/21/24 0707 03/22/24 0316  Weight: 56.7 kg 56.7 kg 57.2 kg     GEN: Well nourished, well developed in no acute distress NECK: No JVD CARDIAC: RRR, no murmurs, rubs, gallops RESPIRATORY:  Clear to auscultation without rales, wheezing or rhonchi  ABDOMEN: Soft, non-tender, non-distended EXTREMITIES:  No edema; No deformity.  Groin sites clear without hematoma or ecchymosis.     Disposition   Pt is being discharged home today in good condition.  Follow-up Plans & Appointments     Follow-up Information     Wonda Sharper, MD. Go on 03/31/2024.   Specialty: Cardiology Why: @ 10:40am, please arrive at least 20 minutes early. Contact information: [address] [place] [address] [phone]                  Discharge Medications   Allergies as of 03/22/2024       Reactions   Influenza Vaccines Nausea And Vomiting, Other (See Comments)   dizziness   Penicillins Hives   Sulfamethoxazole Hives   Sulfonamide Derivatives Hives        Medication List     TAKE these medications    ALPRAZolam  0.5 MG tablet Commonly known as: XANAX  TAKE 1/2 - 1 TABLET ONCE A DAY FOR ANXIETY, SLEEP (LIMIT TO LESS THAN 5 DAYS A WEEK)   aspirin  EC 81 MG tablet Take 81 mg by mouth daily. Swallow whole.   buPROPion  300 MG 24 hr tablet Commonly known as: WELLBUTRIN  XL TAKE ONE TABLET BY MOUTH EVERY MORNING FOR MOOD, ANXIETY, FOCUS AND CONCENTRATION   cyanocobalamin  500 MCG tablet Commonly known as: VITAMIN B12 Take 500 mcg by mouth daily.   furosemide  20 MG tablet Commonly known as: LASIX  Take 0.5 tablets (10 mg total) by mouth daily.   gabapentin  100 MG capsule Commonly known as: NEURONTIN  TAKE ONE CAPSULE THREE TIMES DAILY.   ipratropium 0.03 % nasal spray Commonly known as: ATROVENT  Place 2 sprays into the nose 3 (three) times daily. What changed: when to take this   rosuvastatin  5 MG tablet Commonly known as: CRESTOR  TAKE ONE TABLET ONCE DAILY FOR CHOLESTEROL   Vitamin D  125 MCG (5000 UT) Caps Take 5,000 Units by mouth daily.         Outstanding Labs/Studies   none  ______________________  Duration of Discharge Encounter: APP Time: 15 minutes    Signed, Lamarr Hummer, PA-C 03/22/2024, 10:42 AM [phone]  Patient seen, examined. Available data reviewed. Agree with findings, assessment, and plan as  outlined by Izetta Hummer, PA-C.  The patient is independently interviewed and examined.  She is alert, oriented, in no distress.  HEENT is normal, JVP is normal, lungs are clear bilaterally, heart is regular rate and rhythm with a soft ejection murmur 1/6 at the right upper sternal border with no diastolic murmur, abdomen is soft and nontender, bilateral groin sites are clear with no hematoma or ecchymoses, there is no lower extremity edema.  Telemetry is reviewed and  shows sinus rhythm with PACs but no sustained arrhythmia.  Postprocedural echocardiogram this morning shows LVEF 55 to 60%, normal TAVR valve function with a mean gradient of 10 mmHg, dimensionless index of 0.54, and no paravalvular regurgitation.  MD time spent conducting this discharge is 20 minutes and includes my personal exam of the patient, counseling about discharge instructions and post-TAVR restrictions, review of her echocardiogram and telemetry, and review of her labs.  Ozell Fell, M.D. 03/22/2024 5:17 PM

## 2024-03-21 NOTE — Plan of Care (Signed)
  Problem: Clinical Measurements: Goal: Will remain free from infection Outcome: Progressing   Problem: Nutrition: Goal: Adequate nutrition will be maintained Outcome: Progressing   Problem: Elimination: Goal: Will not experience complications related to bowel motility Outcome: Progressing Goal: Will not experience complications related to urinary retention Outcome: Progressing   Problem: Pain Managment: Goal: General experience of comfort will improve and/or be controlled Outcome: Progressing

## 2024-03-22 ENCOUNTER — Inpatient Hospital Stay (HOSPITAL_COMMUNITY)
Admission: RE | Admit: 2024-03-22 | Discharge: 2024-03-22 | Disposition: A | Source: Home / Self Care | Attending: Physician Assistant | Admitting: Cardiovascular Disease

## 2024-03-22 ENCOUNTER — Inpatient Hospital Stay (HOSPITAL_COMMUNITY)

## 2024-03-22 ENCOUNTER — Other Ambulatory Visit: Payer: Self-pay

## 2024-03-22 DIAGNOSIS — I447 Left bundle-branch block, unspecified: Secondary | ICD-10-CM | POA: Insufficient documentation

## 2024-03-22 DIAGNOSIS — Z952 Presence of prosthetic heart valve: Secondary | ICD-10-CM

## 2024-03-22 LAB — BASIC METABOLIC PANEL WITH GFR
Anion gap: 9 (ref 5–15)
BUN: 11 mg/dL (ref 8–23)
CO2: 24 mmol/L (ref 22–32)
Calcium: 8.9 mg/dL (ref 8.9–10.3)
Chloride: 106 mmol/L (ref 98–111)
Creatinine, Ser: 0.9 mg/dL (ref 0.44–1.00)
GFR, Estimated: >60 mL/min (ref >60)
Glucose, Bld: 102 mg/dL — ABNORMAL HIGH (ref 70–99)
Potassium: 3.5 mmol/L (ref 3.5–5.1)
Sodium: 139 mmol/L (ref 135–145)

## 2024-03-22 LAB — CBC
HCT: 34.6 % — ABNORMAL LOW (ref 36.0–46.0)
Hemoglobin: 11.4 g/dL — ABNORMAL LOW (ref 12.0–15.0)
MCH: 32.2 pg (ref 26.0–34.0)
MCHC: 32.9 g/dL (ref 30.0–36.0)
MCV: 97.7 fL (ref 80.0–100.0)
Platelets: 115 K/uL — ABNORMAL LOW (ref 150–400)
RBC: 3.54 MIL/uL — ABNORMAL LOW (ref 3.87–5.11)
RDW: 12.3 % (ref 11.5–15.5)
WBC: 6.9 K/uL (ref 4.0–10.5)
nRBC: 0 % (ref 0.0–0.2)

## 2024-03-22 LAB — ECHOCARDIOGRAM COMPLETE
AR max vel: 1.88 cm2
AV Area VTI: 1.72 cm2
AV Area mean vel: 1.91 cm2
AV Mean grad: 10 mmHg
AV Peak grad: 18.1 mmHg
Ao pk vel: 2.13 m/s
Area-P 1/2: 8.52 cm2
Calc EF: 51.6 %
Height: 66 in
MV VTI: 2.19 cm2
S' Lateral: 2.9 cm
Single Plane A2C EF: 54.8 %
Single Plane A4C EF: 50.1 %
Weight: 2017.65 [oz_av]

## 2024-03-22 LAB — MAGNESIUM: Magnesium: 1.7 mg/dL (ref 1.7–2.4)

## 2024-03-22 NOTE — Progress Notes (Signed)
 Mobility Specialist Progress Note;   03/22/24 0847  Mobility  Activity Ambulated independently  Level of Assistance Standby assist, set-up cues, supervision of patient - no hands on  Assistive Device None  Distance Ambulated (ft) 250 ft  Activity Response Tolerated well  Mobility Referral Yes  Mobility visit 1 Mobility  Mobility Specialist Start Time (ACUTE ONLY) 0847  Mobility Specialist Stop Time (ACUTE ONLY) 0853  Mobility Specialist Time Calculation (min) (ACUTE ONLY) 6 min   Pt pleasant and agreeable to mobility. Required no physical assistance during ambulation. VSS throughout and no c/o when asked. States her breathing feels much better. No bleeding noted. Pt returned back to bed and left with all needs met.   Lauraine Erm Mobility Specialist Please contact via SecureChat or [employer] [phone]

## 2024-03-22 NOTE — Progress Notes (Signed)
 Discussed with pt restrictions, exercise guidelines, and CRPII. Pt receptive. Will refer to [facility] CRPII.  [id number] Aliene Aris BS, ACSM-CEP 03/22/2024 11:01 AM

## 2024-03-22 NOTE — Progress Notes (Signed)
 Discharge   Patient expressed verbal understanding of discharge POC.   Patient given time to ask any questions.  Additional education included in AVS.  Alert oriented in good spirits.   Tele and PIV removed. CCMD/Dwayne.  Pressure dressings intact.  Zio patch placed with education by cardiac team.   Discharge to [facility].

## 2024-03-22 NOTE — Progress Notes (Signed)
   03/22/24 1141  TOC Brief Assessment  Insurance and Status Reviewed  Patient has primary care physician Yes  Home environment has been reviewed home  Prior level of function: self/independent  Prior/Current Home Services No current home services  Social Drivers of Health Review SDOH reviewed no interventions necessary  Readmission risk has been reviewed Yes  Transition of care needs no transition of care needs at this time    Pt s/p TAVR, stable for transition home today, no barriers to discharge noted.

## 2024-03-23 ENCOUNTER — Telehealth: Payer: Self-pay

## 2024-03-23 NOTE — Telephone Encounter (Signed)
 Patient contacted regarding discharge from [facility] on 03/22/24.  Patient understands to follow up with provider Dr Ozell Fell on 03/31/24 at 10:40AM at [address] Location. Patient understands discharge instructions? Yes Patient understands medications and regiment? Yes Patient understands to bring all medications to this visit? Yes

## 2024-03-24 ENCOUNTER — Telehealth: Payer: Self-pay | Admitting: Cardiovascular Disease

## 2024-03-24 NOTE — Telephone Encounter (Signed)
 [employer] calling to report critical EKG results. Reference number is [id number]

## 2024-03-24 NOTE — Telephone Encounter (Signed)
 I talked to Kathryn Chavez and she is feeling fine with no fatigue, weakness, dizziness or syncope. I reviewed the strips and do not think anything meets criteria for a pacemaker at this time. There is baseline artifact with 6 seconds of possible Mobitz, copied below. She is not on any AV nodal block agents. Will continue to monitor at this time. I went over ER precautions.

## 2024-03-24 NOTE — Telephone Encounter (Signed)
 Call to patient to ask about symptoms at 9:56 AM today 03/24/24 as irhythm reports Mobitz II. Patient reports she has already spoken with Lamarr Hummer, PA-C.

## 2024-03-24 NOTE — Telephone Encounter (Signed)
 Spoke with I rhythm through call back call. Advised they had already spoke to [employer] E. Asked if report had been sent in and was advised that the other rep was working on that.   This RN sees Geni Sar took call on our end and will defer to her for report.

## 2024-03-28 ENCOUNTER — Ambulatory Visit: Admitting: Cardiovascular Disease

## 2024-03-31 ENCOUNTER — Ambulatory Visit: Attending: Cardiovascular Disease | Admitting: Cardiovascular Disease

## 2024-03-31 ENCOUNTER — Encounter: Payer: Self-pay | Admitting: Cardiovascular Disease

## 2024-03-31 VITALS — BP 110/70 | HR 69 | Ht 66.0 in | Wt 125.6 lb

## 2024-03-31 DIAGNOSIS — Z952 Presence of prosthetic heart valve: Secondary | ICD-10-CM | POA: Diagnosis not present

## 2024-03-31 DIAGNOSIS — I447 Left bundle-branch block, unspecified: Secondary | ICD-10-CM | POA: Diagnosis not present

## 2024-03-31 NOTE — Assessment & Plan Note (Addendum)
 Patient doing very well with normal TAVR valve function.  She is cleared to begin cardiac rehab.  Continue aspirin  81 mg daily.  Continue SBE prophylaxis when indicated.

## 2024-03-31 NOTE — Patient Instructions (Signed)
 Medication Instructions:  Your physician recommends that you continue on your current medications as directed. Please refer to the Current Medication list given to you today.  *If you need a refill on your cardiac medications before your next appointment, please call your pharmacy*  Lab Work: NONE If you have labs (blood work) drawn today and your tests are completely normal, you will receive your results only by: MyChart Message (if you have MyChart) OR A paper copy in the mail If you have any lab test that is abnormal or we need to change your treatment, we will call you to review the results.  Testing/Procedures: NONE  Follow-Up: At [facility], you and your health needs are our priority.  As part of our continuing mission to provide you with exceptional heart care, our providers are all part of one team.  This team includes your primary Cardiologist (physician) and Advanced Practice Providers or APPs (Physician Assistants and Nurse Practitioners) who all work together to provide you with the care you need, when you need it.  Your next appointment:   As needed  Provider:   Wonda, MD  We recommend signing up for the patient portal called MyChart.  Sign up information is provided on this After Visit Summary.  MyChart is used to connect with patients for Virtual Visits (Telemedicine).  Patients are able to view lab/test results, encounter notes, upcoming appointments, etc.  Non-urgent messages can be sent to your provider as well.   To learn more about what you can do with MyChart, go to [email or web address].   Other Instructions Per Dr. Wonda, you are cleared for cardiac rehab

## 2024-03-31 NOTE — Progress Notes (Signed)
 Cardiology Office Note:    Date:  03/31/2024   ID:  JULEY GIOVANETTI, DOB 1950/11/23, MRN [id number]  PCP:  Laurice President, NP   [facility] Providers Cardiologist:  Maude Emmer, MD Structural Heart:  Ozell Fell, MD    Referring MD: Laurice President, NP   Chief Complaint  Patient presents with   Follow-up    S/P TAVR    History of Present Illness:    Kathryn Chavez is a 73 y.o. female with a hx of symptomatic aortic stenosis status post TAVR 03/21/2024, presenting for follow-up today.  The patient had uncomplicated transfemoral TAVR with a 23 mm Edwards SAPIEN 3 valve via transfemoral access on 03/21/2024.  She did have a new left bundle branch block following TAVR but this has resolved.  She was discharged on a ZIO AT to evaluate for any delayed heart block and we have seen evidence of second-degree type I AV block.  She otherwise has done well.  Her postoperative echo showed LVEF 55 to 60% and normal TAVR valve function with a mean gradient of 10 mmHg and no paravalvular regurgitation.  The patient is here with her husband today.  She is a little fatigued but otherwise feels well.  No chest pain, shortness of breath, lightheadedness, or heart palpitations.  She has occasional flashes that go across her eyes.  These are fleeting in nature.  She denies any tunnel vision or near syncope.  Current Medications: Active Medications[1]   Allergies:   Influenza vaccines, Penicillins, Sulfamethoxazole, and Sulfonamide derivatives   ROS:   Please see the history of present illness.    All other systems reviewed and are negative.  EKGs/Labs/Other Studies Reviewed:    The following studies were reviewed today: Cardiac Studies & Procedures   ______________________________________________________________________________________________ CARDIAC CATHETERIZATION  CARDIAC CATHETERIZATION 12/16/2023  Conclusion 1) Widely patent coronary arteries with no significant coronary  artery disease (left dominant) 2) Calcified, restricted appearing aortic valve on plain fluoroscopy with known severe aortic stenosis  Recommendations: Continue TAVR evaluation  Findings Coronary Findings Diagnostic  Dominance: Left  Left Main Vessel is angiographically normal. Patent vessel with no significant stenosis  Left Anterior Descending Vessel is small. The LAD is a relatively small caliber vessel.  The LAD is patent with no significant stenosis.  The diagonal branches are patent.  The LAD terminates in the distal anterior wall.  Left Circumflex Vessel is large. The circumflex is super dominant with a large obtuse marginal branch, AV circumflex supplying PDA and PLA branches.  All are patent with no significant stenoses.  First Obtuse Marginal Branch Vessel is large in size.  Right Coronary Artery Vessel is small. Vessel is angiographically normal. Small nondominant RCA with no stenosis  Intervention  No interventions have been documented.   STRESS TESTS  NM PET CT CARDIAC PERFUSION MULTI W/ABSOLUTE BLOODFLOW 12/16/2021  Narrative   LV perfusion is abnormal. There is evidence of ischemia. There is no evidence of infarction. Defect 1: There is a small defect with mild reduction in uptake present in the mid anterolateral location(s) that is reversible. There is normal wall motion in the defect area. Consistent with artifact.   Sub-optimal CT attenuation-misregistration.  Sub optimal stress input curves may be normal depending on Rubium Generator cycle of Rubifil.   Myocardial blood flow was computed to be 0.15ml/g/min at rest and 2.66ml/g/min at stress. Global myocardial blood flow reserve was 2.45 and was normal.   Rest left ventricular function is normal. Rest EF: 62 %.  Stress left ventricular function is normal. Stress EF: 50 %. End diastolic cavity size is normal. End systolic cavity size is normal.   Normal LVEF Reserve 12%   Coronary calcium  was present on the  attenuation correction CT images. Severe coronary calcifications were present. Coronary calcifications were present in the left anterior descending artery distribution(s).   Given normal MBFR, suspect the study is normal. The study is low risk.   Read by Stanly Leavens MD FASE  CLINICAL DATA:  This over-read does not include interpretation of cardiac or coronary anatomy or pathology. The Cardiac PET CT interpretation by the cardiologist is attached.  COMPARISON:  None Available.  FINDINGS: Vascular: Atherosclerotic calcifications of the aorta. No acute noncardiac vascular finding.  Mediastinum/Nodes: Within the visualized portions of the chest there are no pathologically enlarged mediastinal, hilar or axillary lymph nodes within the limitation of noncontrast enhanced examination. The distal esophagus is grossly unremarkable.  Lungs/Pleura: Within the visualized portions of the chest there are no suspicious pulmonary nodules or masses no focal airspace consolidation, and no pleural effusion or pneumothorax on this motion degraded examination.  Upper Abdomen: No acute abnormality.  Musculoskeletal: No acute osseous abnormality. Multilevel degenerative changes spine.  IMPRESSION: No acute noncardiac finding in the chest.  Aortic Atherosclerosis (ICD10-I70.0).   Electronically Signed By: Reyes Holder M.D. On: 12/16/2021 16:32   ECHOCARDIOGRAM  ECHOCARDIOGRAM COMPLETE 03/22/2024  Narrative ECHOCARDIOGRAM REPORT    Patient Name:   Kathryn Chavez Date of Exam: 03/22/2024 Medical Rec #:  [id number]      Height:       66.0 in Accession #:    [id number]     Weight:       126.1 lb Date of Birth:  04/29/1950      BSA:          1.644 m Patient Age:    73 years       BP:           144/68 mmHg Patient Gender: F              HR:           87 bpm. Exam Location:  Inpatient  Procedure: 2D Echo (Both Spectral and Color Flow Doppler were utilized  during procedure).  Indications:    Post TAVR evaluation  History:        Patient has prior history of Echocardiogram examinations. Risk Factors:Hypertension. Aortic Valve: 23 mm Edwards Sapien prosthetic, stented (TAVR) valve is present in the aortic position.  Sonographer:    Charmaine Gaskins Referring Phys: [id number] KATHRYN R THOMPSON  IMPRESSIONS   1. Left ventricular ejection fraction, by estimation, is 55 to 60%. The left ventricle has normal function. The left ventricle has no regional wall motion abnormalities. Left ventricular diastolic parameters are consistent with Grade I diastolic dysfunction (impaired relaxation). 2. Right ventricular systolic function is normal. The right ventricular size is normal. 3. The mitral valve is normal in structure. No evidence of mitral valve regurgitation. No evidence of mitral stenosis. 4. Well seated prosthetic valve with normal leaflet motion and normal function (PV 2.1 m/sec, MG 10 mmHg, and DVI 0.54) with no evidence of transvalvular or paravalvular regurgitation. The aortic valve has been repaired/replaced. Aortic valve regurgitation is not visualized. No aortic stenosis is present. There is a 23 mm Edwards Sapien prosthetic (TAVR) valve present in the aortic position. 5. The inferior vena cava is normal in size with greater than 50% respiratory variability, suggesting right atrial pressure  of 3 mmHg.  Comparison(s): Changes from prior study are noted. No evidence of PVL and gradient similar across TAVR.  FINDINGS Left Ventricle: Left ventricular ejection fraction, by estimation, is 55 to 60%. The left ventricle has normal function. The left ventricle has no regional wall motion abnormalities. The left ventricular internal cavity size was normal in size. There is no left ventricular hypertrophy. Abnormal (paradoxical) septal motion, consistent with left bundle branch block. Left ventricular diastolic parameters are consistent with Grade I  diastolic dysfunction (impaired relaxation).  Right Ventricle: The right ventricular size is normal. No increase in right ventricular wall thickness. Right ventricular systolic function is normal.  Left Atrium: Left atrial size was normal in size.  Right Atrium: Right atrial size was normal in size.  Pericardium: There is no evidence of pericardial effusion.  Mitral Valve: The mitral valve is normal in structure. No evidence of mitral valve regurgitation. No evidence of mitral valve stenosis. MV peak gradient, 8.3 mmHg. The mean mitral valve gradient is 3.0 mmHg.  Tricuspid Valve: The tricuspid valve is normal in structure. Tricuspid valve regurgitation is mild . No evidence of tricuspid stenosis.  Aortic Valve: Well seated prosthetic valve with normal leaflet motion and normal function (PV 2.1 m/sec, MG 10 mmHg, and DVI 0.54) with no evidence of transvalvular or paravalvular regurgitation. The aortic valve has been repaired/replaced. Aortic valve regurgitation is not visualized. No aortic stenosis is present. Aortic valve mean gradient measures 10.0 mmHg. Aortic valve peak gradient measures 18.1 mmHg. Aortic valve area, by VTI measures 1.72 cm. There is a 23 mm Edwards Sapien prosthetic, stented (TAVR) valve present in the aortic position.  Pulmonic Valve: The pulmonic valve was normal in structure. Pulmonic valve regurgitation is not visualized. No evidence of pulmonic stenosis.  Aorta: The aortic root is normal in size and structure.  Venous: The inferior vena cava is normal in size with greater than 50% respiratory variability, suggesting right atrial pressure of 3 mmHg.  IAS/Shunts: No atrial level shunt detected by color flow Doppler.   LEFT VENTRICLE PLAX 2D LVIDd:         3.90 cm     Diastology LVIDs:         2.90 cm     LV e' medial:    4.35 cm/s LV PW:         0.90 cm     LV E/e' medial:  16.7 LV IVS:        0.90 cm     LV e' lateral:   7.72 cm/s LVOT diam:     2.04 cm      LV E/e' lateral: 9.4 LV SV:         67 LV SV Index:   41 LVOT Area:     3.27 cm  LV Volumes (MOD) LV vol d, MOD A2C: 61.0 ml LV vol d, MOD A4C: 68.4 ml LV vol s, MOD A2C: 27.6 ml LV vol s, MOD A4C: 34.1 ml LV SV MOD A2C:     33.4 ml LV SV MOD A4C:     68.4 ml LV SV MOD BP:      34.4 ml  RIGHT VENTRICLE RV Basal diam:  2.24 cm RV Mid diam:    2.10 cm RV S prime:     14.30 cm/s  LEFT ATRIUM           Index        RIGHT ATRIUM  Index LA diam:      2.52 cm 1.53 cm/m   RA Area:     10.90 cm LA Vol (A2C): 38.1 ml 23.18 ml/m  RA Volume:   20.90 ml  12.71 ml/m LA Vol (A4C): 40.4 ml 24.58 ml/m AORTIC VALVE AV Area (Vmax):    1.88 cm AV Area (Vmean):   1.91 cm AV Area (VTI):     1.72 cm AV Vmax:           212.50 cm/s AV Vmean:          149.250 cm/s AV VTI:            0.388 m AV Peak Grad:      18.1 mmHg AV Mean Grad:      10.0 mmHg LVOT Vmax:         122.00 cm/s LVOT Vmean:        87.200 cm/s LVOT VTI:          0.204 m LVOT/AV VTI ratio: 0.53  AORTA Ao Root diam: 3.47 cm Ao Asc diam:  3.30 cm  MITRAL VALVE                TRICUSPID VALVE MV Area (PHT): 8.52 cm     TR Peak grad:   23.2 mmHg MV Area VTI:   2.19 cm     TR Vmax:        241.00 cm/s MV Peak grad:  8.3 mmHg MV Mean grad:  3.0 mmHg     SHUNTS MV Vmax:       1.44 m/s     Systemic VTI:  0.20 m MV Vmean:      83.9 cm/s    Systemic Diam: 2.04 cm MV Decel Time: 89 msec MV E velocity: 72.70 cm/s MV A velocity: 133.00 cm/s MV E/A ratio:  0.55  Franck Azobou Tonleu Electronically signed by Joelle Cedars Tonleu Signature Date/Time: 03/22/2024/11:38:08 AM    Final      CT SCANS  CT CORONARY MORPH W/CTA COR W/SCORE 11/26/2023  Addendum 12/01/2023 11:34 AM ADDENDUM REPORT: 12/01/2023 11:32  EXAM: OVER-READ INTERPRETATION  CT CHEST  The following report is an over-read performed by radiologist Dr. Mabel Converse of [facility] on 12/01/2023. This over-read does not include  interpretation of cardiac or coronary anatomy or pathology. The coronary CTA interpretation by the cardiologist is attached.  COMPARISON:  None.  FINDINGS: Normal heart size. No pericardial effusion. Image thoracic aorta is nonaneurysmal. Central pulmonary vasculature within normal limits.  No adenopathy within the included chest. Imaged lung fields are clear. No acute bony or chest wall abnormality.  IMPRESSION: No acute or significant extracardiac findings.   Electronically Signed By: Mabel Converse D.O. On: 12/01/2023 11:32  Narrative CLINICAL DATA:  48F with severe aortic stenosis being evaluated for a TAVR procedure.  EXAM: Cardiac TAVR CT  TECHNIQUE: A non-contrast, gated CT scan was obtained with axial slices of 2.5 mm through the heart for aortic valve scoring. A 120 kV retrospective, gated, contrast cardiac scan was obtained. Gantry rotation speed was 230 msec and collimation was 0.63 mm. Nitroglycerin  was not given. A delayed scan was obtained to exclude left atrial appendage thrombus. The 3D dataset was reconstructed in systole with motion correction. The 3D data set was reconstructed in 5% intervals of the 0-95% of the R-R cycle. Systolic and diastolic phases were analyzed on a dedicated workstation using MPR, MIP, and VRT modes. The patient received 100 cc of contrast.  FINDINGS:  Aortic Root:  Aortic valve: Tricuspid, though functionally bicuspid with partial fusion of left and right cusps  Aortic valve calcium  score: 1624  Aortic annulus:  Diameter: 26mm x 24mm  Perimeter: 76mm  Area: 428mm^2  Calcifications: No calcifications  Coronary height: Min Left - 8mm; Min Right - 19mm  Sinotubular height: Left cusp -17mm ; Right cusp - 25mm; Noncoronary cusp - 24mm  LVOT (as measured 3 mm below the annulus):  Diameter: 25mm x 23mm  Area: 460mm^2  Calcifications: No calcifications  Aortic sinus width: Left cusp - 32mm; Right cusp - 28mm;  Noncoronary cusp - 32mm  Sinotubular junction width: 26mm x 25mm  Optimum Fluoroscopic Angle for Delivery: RAO 2 CAU 42  Cardiac:  Right atrium: Mild enlargement  Right ventricle: Normal size  Pulmonary arteries: Normal size  Pulmonary veins: Normal configuration  Left atrium: Normal size.  Interatrial septal aneurysm.  Left ventricle: Normal size  Pericardium: Normal thickness  Coronary arteries: Normal origin.  Calcium  score 220  IMPRESSION: 1. Tricuspid aortic valve, though functionally bicuspid with partial fusion of left and right cusps. Severe aortic valve calcifications (AV calcium  score 1624)  2. Aortic annulus measures 26mm x 24mm in diameter with perimeter 76mm and area 456mm^2. No annular or LVOT calcifications. Annular measurements are right on the border between 23mm and 26mm Edwards Sapien 3 valve. Annular measurements would be appropriate for a 29mm Evolut valve, but sinus of Valsalva diameter at the right cusp (28mm) is below the recommended minimum (29mm) for a 29mm Evolut valve. Recommend discussion at structural heart conference.  3. Low coronary height to left main (8mm). Sufficient coronary height for RCA  4.  Optimum Fluoroscopic Angle for Delivery:  RAO 2 CAU 42  5.  Coronary calcium  score 220 (75th percentile)  Electronically Signed: By: Lonni Nanas M.D. On: 11/26/2023 14:16     ______________________________________________________________________________________________      EKG:   EKG Interpretation Date/Time:  Friday March 31 2024 11:23:08 EST Ventricular Rate:  69 PR Interval:  210 QRS Duration:  86 QT Interval:  412 QTC Calculation: 441 R Axis:   50  Text Interpretation: Sinus rhythm with 1st degree A-V block When compared with ECG of 22-Mar-2024 09:12, Left bundle branch block is no longer Present Confirmed by Wonda Sharper [phone]) on 03/31/2024 11:31:43 AM    Recent Labs: 11/22/2023: NT-Pro BNP  694 03/17/2024: ALT 25 03/22/2024: BUN 11; Creatinine, Ser 0.90; Hemoglobin 11.4; Magnesium  1.7; Platelets 115; Potassium 3.5; Sodium 139  Recent Lipid Panel    Component Value Date/Time   CHOL 207 (H) 12/15/2022 1644   TRIG 71 12/15/2022 1644   HDL 110 12/15/2022 1644   CHOLHDL 1.9 12/15/2022 1644   VLDL 24 09/09/2016 1017   LDLCALC 82 12/15/2022 1644   LDLDIRECT 73.3 04/30/2011 0954     Risk Assessment/Calculations:                Physical Exam:    VS:  BP 110/70 (BP Location: Left Arm, Patient Position: Sitting, Cuff Size: Normal)   Pulse 69   Ht 5' 6 (1.676 m)   Wt 125 lb 9.6 oz (57 kg)   SpO2 97%   BMI 20.27 kg/m     Wt Readings from Last 3 Encounters:  03/31/24 125 lb 9.6 oz (57 kg)  03/22/24 126 lb 1.7 oz (57.2 kg)  03/02/24 125 lb (56.7 kg)     GEN:  Well nourished, well developed in no acute distress HEENT: Normal NECK: No JVD; No  carotid bruits LYMPHATICS: No lymphadenopathy CARDIAC: RRR, no murmurs, rubs, gallops RESPIRATORY:  Clear to auscultation without rales, wheezing or rhonchi  ABDOMEN: Soft, non-tender, non-distended MUSCULOSKELETAL:  No edema; No deformity  SKIN: Warm and dry NEUROLOGIC:  Alert and oriented x 3 PSYCHIATRIC:  Normal affect   Assessment & Plan S/P TAVR (transcatheter aortic valve replacement) Patient doing very well with normal TAVR valve function.  She is cleared to begin cardiac rehab.  Continue aspirin  81 mg daily.  Continue SBE prophylaxis when indicated. LBBB (left bundle branch block) Transient, following TAVR.  EKG today shows sinus rhythm with a PR interval of 210 ms and narrow complex QRS.  Interestingly, the incomplete right bundle branch block present before TAVR is no longer present on today's EKG.  She has had Mobitz 1 AV block seen on her heart monitor with occasional 2: 1 AV block.  No associated symptoms.  Continue observation.  She will finish her ZIO monitor and 5 more days.     Cardiac Rehabilitation  Eligibility Assessment  The patient is ready to start cardiac rehabilitation from a cardiac standpoint.          Medication Adjustments/Labs and Tests Ordered: Current medicines are reviewed at length with the patient today.  Concerns regarding medicines are outlined above.  Orders Placed This Encounter  Procedures   EKG 12-Lead   EKG 12-Lead   No orders of the defined types were placed in this encounter.   Patient Instructions  Medication Instructions:  Your physician recommends that you continue on your current medications as directed. Please refer to the Current Medication list given to you today.  *If you need a refill on your cardiac medications before your next appointment, please call your pharmacy*  Lab Work: NONE If you have labs (blood work) drawn today and your tests are completely normal, you will receive your results only by: MyChart Message (if you have MyChart) OR A paper copy in the mail If you have any lab test that is abnormal or we need to change your treatment, we will call you to review the results.  Testing/Procedures: NONE  Follow-Up: At [facility] you and your health needs are our priority.  As part of our continuing mission to provide you with exceptional heart care, our providers are all part of one team.  This team includes your primary Cardiologist (physician) and Advanced Practice Providers or APPs (Physician Assistants and Nurse Practitioners) who all work together to provide you with the care you need, when you need it.  Your next appointment:   As needed  Provider:   Wonda, MD  We recommend signing up for the patient portal called MyChart.  Sign up information is provided on this After Visit Summary.  MyChart is used to connect with patients for Virtual Visits (Telemedicine).  Patients are able to view lab/test results, encounter notes, upcoming appointments, etc.  Non-urgent messages can be sent to your provider as well.   To  learn more about what you can do with MyChart, go to [email or web address].   Other Instructions Per Dr. Wonda, you are cleared for cardiac rehab          Signed, Ozell Wonda, MD  03/31/2024 1:12 PM    [facility]     [1]  Current Meds  Medication Sig   ALPRAZolam  (XANAX ) 0.5 MG tablet TAKE 1/2 - 1 TABLET ONCE A DAY FOR ANXIETY, SLEEP (LIMIT TO LESS THAN 5 DAYS A WEEK)   aspirin  EC 81  MG tablet Take 81 mg by mouth daily. Swallow whole.   buPROPion  (WELLBUTRIN  XL) 300 MG 24 hr tablet TAKE ONE TABLET BY MOUTH EVERY MORNING FOR MOOD, ANXIETY, FOCUS AND CONCENTRATION   Cholecalciferol (VITAMIN D ) 125 MCG (5000 UT) CAPS Take 5,000 Units by mouth daily.   cyanocobalamin  (VITAMIN B12) 500 MCG tablet Take 500 mcg by mouth daily.   furosemide  (LASIX ) 20 MG tablet Take 0.5 tablets (10 mg total) by mouth daily.   gabapentin  (NEURONTIN ) 100 MG capsule TAKE ONE CAPSULE THREE TIMES DAILY.   ipratropium (ATROVENT ) 0.03 % nasal spray Place 2 sprays into the nose 3 (three) times daily. (Patient taking differently: Place 2 sprays into the nose daily.)   rosuvastatin  (CRESTOR ) 5 MG tablet TAKE ONE TABLET ONCE DAILY FOR CHOLESTEROL

## 2024-03-31 NOTE — Assessment & Plan Note (Addendum)
 Transient, following TAVR.  EKG today shows sinus rhythm with a PR interval of 210 ms and narrow complex QRS.  Interestingly, the incomplete right bundle branch block present before TAVR is no longer present on today's EKG.  She has had Mobitz 1 AV block seen on her heart monitor with occasional 2: 1 AV block.  No associated symptoms.  Continue observation.  She will finish her ZIO monitor and 5 more days.

## 2024-04-03 ENCOUNTER — Ambulatory Visit: Admitting: Cardiovascular Disease

## 2024-04-17 ENCOUNTER — Other Ambulatory Visit: Payer: Self-pay

## 2024-04-17 DIAGNOSIS — I447 Left bundle-branch block, unspecified: Secondary | ICD-10-CM | POA: Diagnosis not present

## 2024-04-17 DIAGNOSIS — Z952 Presence of prosthetic heart valve: Secondary | ICD-10-CM

## 2024-04-17 NOTE — Progress Notes (Signed)
 EP referral has been placed per Dr. Nishan for LBBB s/p TAVR

## 2024-04-17 NOTE — Addendum Note (Signed)
 Encounter addended by: Malvina Pina A on: 04/17/2024 8:15 AM  Actions taken: Imaging Exam ended

## 2024-04-18 ENCOUNTER — Ambulatory Visit: Payer: Self-pay | Admitting: Physician Assistant

## 2024-04-25 ENCOUNTER — Telehealth (HOSPITAL_COMMUNITY): Payer: Self-pay

## 2024-04-25 NOTE — Telephone Encounter (Signed)
 Called patient to see if she was interested in participating in the Cardiac Rehab Program. Patient will come in for orientation on 1/13 and will attend the 8:15 exercise class.  Sent MyChart message.

## 2024-04-27 ENCOUNTER — Encounter (HOSPITAL_COMMUNITY): Payer: Self-pay

## 2024-04-27 ENCOUNTER — Telehealth (HOSPITAL_COMMUNITY): Payer: Self-pay

## 2024-04-27 NOTE — Telephone Encounter (Signed)
 Confirmed cardiac orientation appointment time of 05/03/23 at 1030.  Pt educated on location, what to bring, footwear, and approximate length of orientation and cardiac health history completed.

## 2024-05-01 ENCOUNTER — Ambulatory Visit (HOSPITAL_COMMUNITY)
Admission: RE | Admit: 2024-05-01 | Discharge: 2024-05-01 | Disposition: A | Discharge status: Home / Self Care | Source: Ambulatory Visit | Attending: Internal Medicine | Admitting: Internal Medicine

## 2024-05-01 DIAGNOSIS — Z952 Presence of prosthetic heart valve: Secondary | ICD-10-CM | POA: Insufficient documentation

## 2024-05-01 LAB — ECHOCARDIOGRAM COMPLETE
AR max vel: 2.19 cm2
AV Area VTI: 2.03 cm2
AV Area mean vel: 2.03 cm2
AV Mean grad: 12.5 mmHg
AV Peak grad: 22.1 mmHg
Ao pk vel: 2.35 m/s
Area-P 1/2: 3.6 cm2
S' Lateral: 3.4 cm

## 2024-05-02 ENCOUNTER — Encounter (HOSPITAL_COMMUNITY): Payer: Self-pay

## 2024-05-02 ENCOUNTER — Ambulatory Visit: Payer: Self-pay | Admitting: Physician Assistant

## 2024-05-02 ENCOUNTER — Encounter (HOSPITAL_COMMUNITY)
Admission: RE | Admit: 2024-05-02 | Discharge: 2024-05-02 | Disposition: A | Discharge status: Home / Self Care | Source: Ambulatory Visit | Attending: Cardiovascular Disease | Admitting: Cardiovascular Disease

## 2024-05-02 VITALS — BP 112/70 | HR 74 | Ht 66.0 in | Wt 127.2 lb

## 2024-05-02 DIAGNOSIS — Z48812 Encounter for surgical aftercare following surgery on the circulatory system: Secondary | ICD-10-CM | POA: Diagnosis present

## 2024-05-02 DIAGNOSIS — Z952 Presence of prosthetic heart valve: Secondary | ICD-10-CM | POA: Diagnosis present

## 2024-05-02 NOTE — Progress Notes (Signed)
 Cardiac Individual Treatment Plan  Patient Details  Name: Kathryn Chavez MRN: [id number] Date of Birth: June 18, 1950 Referring Provider:   Flowsheet Row INTENSIVE CARDIAC REHAB ORIENT from 05/02/2024 in [facility]  Referring Provider Delford Maude BROCKS, MD    Initial Encounter Date:  Flowsheet Row INTENSIVE CARDIAC REHAB ORIENT from 05/02/2024 in [facility]  Date 05/02/24    Visit Diagnosis: 03/21/24 TAVR  Patient's Home Medications on Admission: Current Medications[1]  Past Medical History: Past Medical History:  Diagnosis Date   Allergy    rhinitis   Breast cancer (HCC)    Hyperlipidemia    Hypertension    Personal history of chemotherapy 2000   Personal history of radiation therapy 2000   S/P TAVR (transcatheter aortic valve replacement) 03/21/2024   s/p TAVR with a 23 mm Edwards Sapien 3 Ultra Resilia THV via the TF approach by Dr. Wonda and Dr. Daniel   Severe aortic stenosis     Tobacco Use: Tobacco Use History[2]  Labs: Review Flowsheet  More data exists      Latest Ref Rng & Units 02/18/2022 06/02/2022 09/08/2022 12/15/2022 03/21/2024  Labs for ITP Cardiac and Pulmonary Rehab  Cholestrol <200 mg/dL 747  789  836  792  -  LDL (calc) mg/dL (calc) 884  70  73  82  -  HDL-C > OR = 50 mg/dL 877  876  76  889  -  Trlycerides <150 mg/dL 62  88  68  71  -  Hemoglobin A1c <5.7 % of total Hgb 5.3  - 5.4  - -  TCO2 22 - 32 mmol/L - - - - 22     Capillary Blood Glucose: No results found for: GLUCAP   Exercise Target Goals: Exercise Program Goal: Individual exercise prescription set using results from initial 6 min walk test and THRR while considering  patients activity barriers and safety.   Exercise Prescription Goal: Initial exercise prescription builds to 30-45 minutes a day of aerobic activity, 2-3 days per week.  Home exercise guidelines will be given  to patient during program as part of exercise prescription that the participant will acknowledge.  Activity Barriers & Risk Stratification:  Activity Barriers & Cardiac Risk Stratification - 05/02/24 1055       Activity Barriers & Cardiac Risk Stratification   Activity Barriers None    Cardiac Risk Stratification High          6 Minute Walk:  6 Minute Walk     Row Name 05/02/24 1124         6 Minute Walk   Phase Initial     Distance 1778 feet     Walk Time 6 minutes     # of Rest Breaks 0     MPH 3.37     METS 4.22     RPE 13     Perceived Dyspnea  1     VO2 Peak 14.77     Symptoms Yes (comment)     Comments Mild shortness of breath     Resting HR 74 bpm     Resting BP 112/70     Resting Oxygen Saturation  98 %     Exercise Oxygen Saturation  during 6 min walk 99 %     Max Ex. HR 114 bpm     Max Ex. BP 154/82     2 Minute Post  BP 122/72        Oxygen Initial Assessment:   Oxygen Re-Evaluation:   Oxygen Discharge (Final Oxygen Re-Evaluation):   Initial Exercise Prescription:  Initial Exercise Prescription - 05/02/24 1000       Date of Initial Exercise RX and Referring Provider   Date 05/02/24    Referring Provider Delford Maude BROCKS, MD    Expected Discharge Date 07/26/24      Treadmill   MPH 2.5    Grade 0    Minutes 15    METs 2.91      Recumbant Elliptical   Level 1    RPM 40    Watts 43    Minutes 15    METs 2.9      Prescription Details   Frequency (times per week) 2    Duration Progress to 30 minutes of continuous aerobic without signs/symptoms of physical distress      Intensity   THRR 40-80% of Max Heartrate 58-117    Ratings of Perceived Exertion 11-13    Perceived Dyspnea 0-4      Progression   Progression Continue to progress workloads to maintain intensity without signs/symptoms of physical distress.      Resistance Training   Training Prescription Yes    Weight 2 lbs    Reps 10-15          Perform Capillary Blood  Glucose checks as needed.  Exercise Prescription Changes:   Exercise Comments:   Exercise Goals and Review:   Exercise Goals     Row Name 05/02/24 1027             Exercise Goals   Increase Physical Activity Yes       Intervention Provide advice, education, support and counseling about physical activity/exercise needs.;Develop an individualized exercise prescription for aerobic and resistive training based on initial evaluation findings, risk stratification, comorbidities and participant's personal goals.       Expected Outcomes Short Term: Attend rehab on a regular basis to increase amount of physical activity.;Long Term: Add in home exercise to make exercise part of routine and to increase amount of physical activity.;Long Term: Exercising regularly at least 3-5 days a week.       Increase Strength and Stamina Yes       Intervention Provide advice, education, support and counseling about physical activity/exercise needs.;Develop an individualized exercise prescription for aerobic and resistive training based on initial evaluation findings, risk stratification, comorbidities and participant's personal goals.       Expected Outcomes Short Term: Increase workloads from initial exercise prescription for resistance, speed, and METs.;Short Term: Perform resistance training exercises routinely during rehab and add in resistance training at home;Long Term: Improve cardiorespiratory fitness, muscular endurance and strength as measured by increased METs and functional capacity ( )       Able to understand and use rate of perceived exertion (RPE) scale Yes       Intervention Provide education and explanation on how to use RPE scale       Expected Outcomes Short Term: Able to use RPE daily in rehab to express subjective intensity level;Long Term:  Able to use RPE to guide intensity level when exercising independently       Knowledge and understanding of Target Heart Rate Range (THRR) Yes        Intervention Provide education and explanation of THRR including how the numbers were predicted and where they are located for reference       Expected Outcomes  Short Term: Able to state/look up THRR;Long Term: Able to use THRR to govern intensity when exercising independently;Short Term: Able to use daily as guideline for intensity in rehab       Understanding of Exercise Prescription Yes       Intervention Provide education, explanation, and written materials on patient's individual exercise prescription       Expected Outcomes Short Term: Able to explain program exercise prescription;Long Term: Able to explain home exercise prescription to exercise independently          Exercise Goals Re-Evaluation :   Discharge Exercise Prescription (Final Exercise Prescription Changes):   Nutrition:  Target Goals: Understanding of nutrition guidelines, daily intake of sodium 1500mg , cholesterol 200mg , calories 30% from fat and 7% or less from saturated fats, daily to have 5 or more servings of fruits and vegetables.  Biometrics:  Pre Biometrics - 05/02/24 1021       Pre Biometrics   Waist Circumference 32.25 inches    Hip Circumference 37 inches    Waist to Hip Ratio 0.87 %    Triceps Skinfold 21 mm    % Body Fat 33.1 %    Grip Strength 14 kg    Flexibility 17.25 in    Single Leg Stand 15 seconds           Nutrition Therapy Plan and Nutrition Goals:   Nutrition Assessments:  MEDIFICTS Score Key: >=70 Need to make dietary changes  40-70 Heart Healthy Diet <= 40 Therapeutic Level Cholesterol Diet    Picture Your Plate Scores: <59 Unhealthy dietary pattern with much room for improvement. 41-50 Dietary pattern unlikely to meet recommendations for good health and room for improvement. 51-60 More healthful dietary pattern, with some room for improvement.  >60 Healthy dietary pattern, although there may be some specific behaviors that could be improved.    Nutrition Goals  Re-Evaluation:   Nutrition Goals Re-Evaluation:   Nutrition Goals Discharge (Final Nutrition Goals Re-Evaluation):   Psychosocial: Target Goals: Acknowledge presence or absence of significant depression and/or stress, maximize coping skills, provide positive support system. Participant is able to verbalize types and ability to use techniques and skills needed for reducing stress and depression.  Initial Review & Psychosocial Screening:  Initial Psych Review & Screening - 05/02/24 1240       Initial Review   Current issues with Current Sleep Concerns      Family Dynamics   Good Support System? Yes   Kathryn Chavez has her signifigant other, daughter friends and neighbors for support     Barriers   Psychosocial barriers to participate in program The patient should benefit from training in stress management and relaxation.      Screening Interventions   Interventions Encouraged to exercise    Expected Outcomes Short Term goal: Identification and review with participant of any Quality of Life or Depression concerns found by scoring the questionnaire.;Long Term goal: The participant improves quality of Life and PHQ9 Scores as seen by post scores and/or verbalization of changes          Quality of Life Scores:  Quality of Life - 05/02/24 1059       Quality of Life   Select Quality of Life      Quality of Life Scores   Health/Function Pre 28.8 %    Socioeconomic Pre 30 %    Psych/Spiritual Pre 30 %    Family Pre 30 %    GLOBAL Pre 29.49 %  Scores of 19 and below usually indicate a poorer quality of life in these areas.  A difference of  2-3 points is a clinically meaningful difference.  A difference of 2-3 points in the total score of the Quality of Life Index has been associated with significant improvement in overall quality of life, self-image, physical symptoms, and general health in studies assessing change in quality of life.  PHQ-9: Review Flowsheet  More data  exists      05/02/2024 12/10/2023 06/02/2022 02/22/2022 06/02/2021  Depression screen PHQ 2/9  Decreased Interest 0 0 0 0 0  Down, Depressed, Hopeless 0 0 0 0 0  PHQ - 2 Score 0 0 0 0 0  Altered sleeping 2 - - - -  Tired, decreased energy 1 - - - -  Change in appetite 0 - - - -  Feeling bad or failure about yourself  0 - - - -  Trouble concentrating 0 - - - -  Moving slowly or fidgety/restless 0 - - - -  Suicidal thoughts 0 - - - -  PHQ-9 Score 3 - - - -  Difficult doing work/chores Not difficult at all - - - -   Interpretation of Total Score  Total Score Depression Severity:  1-4 = Minimal depression, 5-9 = Mild depression, 10-14 = Moderate depression, 15-19 = Moderately severe depression, 20-27 = Severe depression   Psychosocial Evaluation and Intervention:   Psychosocial Re-Evaluation:   Psychosocial Discharge (Final Psychosocial Re-Evaluation):   Vocational Rehabilitation: Provide vocational rehab assistance to qualifying candidates.   Vocational Rehab Evaluation & Intervention:  Vocational Rehab - 05/02/24 1054       Initial Vocational Rehab Evaluation & Intervention   Assessment shows need for Vocational Rehabilitation No   Kathryn Chavez plans to return to work after participating in cardiac rehab and does not need vocational rehab at this time         Education: Education Goals: Education classes will be provided on a weekly basis, covering required topics. Participant will state understanding/return demonstration of topics presented.     Core Videos: Exercise    Move It!  Clinical staff conducted group or individual video education with verbal and written material and guidebook.  Patient learns the recommended Pritikin exercise program. Exercise with the goal of living a long, healthy life. Some of the health benefits of exercise include controlled diabetes, healthier blood pressure levels, improved cholesterol levels, improved heart and lung capacity, improved  sleep, and better body composition. Everyone should speak with their doctor before starting or changing an exercise routine.  Biomechanical Limitations Clinical staff conducted group or individual video education with verbal and written material and guidebook.  Patient learns how biomechanical limitations can impact exercise and how we can mitigate and possibly overcome limitations to have an impactful and balanced exercise routine.  Body Composition Clinical staff conducted group or individual video education with verbal and written material and guidebook.  Patient learns that body composition (ratio of muscle mass to fat mass) is a key component to assessing overall fitness, rather than body weight alone. Increased fat mass, especially visceral belly fat, can put us  at increased risk for metabolic syndrome, type 2 diabetes, heart disease, and even death. It is recommended to combine diet and exercise (cardiovascular and resistance training) to improve your body composition. Seek guidance from your physician and exercise physiologist before implementing an exercise routine.  Exercise Action Plan Clinical staff conducted group or individual video education with verbal and written material and  guidebook.  Patient learns the recommended strategies to achieve and enjoy long-term exercise adherence, including variety, self-motivation, self-efficacy, and positive decision making. Benefits of exercise include fitness, good health, weight management, more energy, better sleep, less stress, and overall well-being.  Medical   Heart Disease Risk Reduction Clinical staff conducted group or individual video education with verbal and written material and guidebook.  Patient learns our heart is our most vital organ as it circulates oxygen, nutrients, white blood cells, and hormones throughout the entire body, and carries waste away. Data supports a plant-based eating plan like the Pritikin Program for its  effectiveness in slowing progression of and reversing heart disease. The video provides a number of recommendations to address heart disease.   Metabolic Syndrome and Belly Fat  Clinical staff conducted group or individual video education with verbal and written material and guidebook.  Patient learns what metabolic syndrome is, how it leads to heart disease, and how one can reverse it and keep it from coming back. You have metabolic syndrome if you have 3 of the following 5 criteria: abdominal obesity, high blood pressure, high triglycerides, low HDL cholesterol, and high blood sugar.  Hypertension and Heart Disease Clinical staff conducted group or individual video education with verbal and written material and guidebook.  Patient learns that high blood pressure, or hypertension, is very common in the [place] . Hypertension is largely due to excessive salt intake, but other important risk factors include being overweight, physical inactivity, drinking too much alcohol, smoking, and not eating enough potassium from fruits and vegetables. High blood pressure is a leading risk factor for heart attack, stroke, congestive heart failure, dementia, kidney failure, and premature death. Long-term effects of excessive salt intake include stiffening of the arteries and thickening of heart muscle and organ damage. Recommendations include ways to reduce hypertension and the risk of heart disease.  Diseases of Our Time - Focusing on Diabetes Clinical staff conducted group or individual video education with verbal and written material and guidebook.  Patient learns why the best way to stop diseases of our time is prevention, through food and other lifestyle changes. Medicine (such as prescription pills and surgeries) is often only a Band-Aid on the problem, not a long-term solution. Most common diseases of our time include obesity, type 2 diabetes, hypertension, heart disease, and cancer. The Pritikin Program is  recommended and has been proven to help reduce, reverse, and/or prevent the damaging effects of metabolic syndrome.  Nutrition   Overview of the Pritikin Eating Plan  Clinical staff conducted group or individual video education with verbal and written material and guidebook.  Patient learns about the Pritikin Eating Plan for disease risk reduction. The Pritikin Eating Plan emphasizes a wide variety of unrefined, minimally-processed carbohydrates, like fruits, vegetables, whole grains, and legumes. Go, Caution, and Stop food choices are explained. Plant-based and lean animal proteins are emphasized. Rationale provided for low sodium intake for blood pressure control, low added sugars for blood sugar stabilization, and low added fats and oils for coronary artery disease risk reduction and weight management.  Calorie Density  Clinical staff conducted group or individual video education with verbal and written material and guidebook.  Patient learns about calorie density and how it impacts the Pritikin Eating Plan. Knowing the characteristics of the food you choose will help you decide whether those foods will lead to weight gain or weight loss, and whether you want to consume more or less of them. Weight loss is usually a side effect of  the Pritikin Eating Plan because of its focus on low calorie-dense foods.  Label Reading  Clinical staff conducted group or individual video education with verbal and written material and guidebook.  Patient learns about the Pritikin recommended label reading guidelines and corresponding recommendations regarding calorie density, added sugars, sodium content, and whole grains.  Dining Out - Part 1  Clinical staff conducted group or individual video education with verbal and written material and guidebook.  Patient learns that restaurant meals can be sabotaging because they can be so high in calories, fat, sodium, and/or sugar. Patient learns recommended strategies on  how to positively address this and avoid unhealthy pitfalls.  Facts on Fats  Clinical staff conducted group or individual video education with verbal and written material and guidebook.  Patient learns that lifestyle modifications can be just as effective, if not more so, as many medications for lowering your risk of heart disease. A Pritikin lifestyle can help to reduce your risk of inflammation and atherosclerosis (cholesterol build-up, or plaque, in the artery walls). Lifestyle interventions such as dietary choices and physical activity address the cause of atherosclerosis. A review of the types of fats and their impact on blood cholesterol levels, along with dietary recommendations to reduce fat intake is also included.  Nutrition Action Plan  Clinical staff conducted group or individual video education with verbal and written material and guidebook.  Patient learns how to incorporate Pritikin recommendations into their lifestyle. Recommendations include planning and keeping personal health goals in mind as an important part of their success.  Healthy Mind-Set    Healthy Minds, Bodies, Hearts  Clinical staff conducted group or individual video education with verbal and written material and guidebook.  Patient learns how to identify when they are stressed. Video will discuss the impact of that stress, as well as the many benefits of stress management. Patient will also be introduced to stress management techniques. The way we think, act, and feel has an impact on our hearts.  How Our Thoughts Can Heal Our Hearts  Clinical staff conducted group or individual video education with verbal and written material and guidebook.  Patient learns that negative thoughts can cause depression and anxiety. This can result in negative lifestyle behavior and serious health problems. Cognitive behavioral therapy is an effective method to help control our thoughts in order to change and improve our emotional  outlook.  Additional Videos:  Exercise    Improving Performance  Clinical staff conducted group or individual video education with verbal and written material and guidebook.  Patient learns to use a non-linear approach by alternating intensity levels and lengths of time spent exercising to help burn more calories and lose more body fat. Cardiovascular exercise helps improve heart health, metabolism, hormonal balance, blood sugar control, and recovery from fatigue. Resistance training improves strength, endurance, balance, coordination, reaction time, metabolism, and muscle mass. Flexibility exercise improves circulation, posture, and balance. Seek guidance from your physician and exercise physiologist before implementing an exercise routine and learn your capabilities and proper form for all exercise.  Introduction to Yoga  Clinical staff conducted group or individual video education with verbal and written material and guidebook.  Patient learns about yoga, a discipline of the coming together of mind, breath, and body. The benefits of yoga include improved flexibility, improved range of motion, better posture and core strength, increased lung function, weight loss, and positive self-image. Yogas heart health benefits include lowered blood pressure, healthier heart rate, decreased cholesterol and triglyceride levels, improved immune function,  and reduced stress. Seek guidance from your physician and exercise physiologist before implementing an exercise routine and learn your capabilities and proper form for all exercise.  Medical   Aging: Enhancing Your Quality of Life  Clinical staff conducted group or individual video education with verbal and written material and guidebook.  Patient learns key strategies and recommendations to stay in good physical health and enhance quality of life, such as prevention strategies, having an advocate, securing a Health Care Proxy and Power of Attorney, and keeping  a list of medications and system for tracking them. It also discusses how to avoid risk for bone loss.  Biology of Weight Control  Clinical staff conducted group or individual video education with verbal and written material and guidebook.  Patient learns that weight gain occurs because we consume more calories than we burn (eating more, moving less). Even if your body weight is normal, you may have higher ratios of fat compared to muscle mass. Too much body fat puts you at increased risk for cardiovascular disease, heart attack, stroke, type 2 diabetes, and obesity-related cancers. In addition to exercise, following the Pritikin Eating Plan can help reduce your risk.  Decoding Lab Results  Clinical staff conducted group or individual video education with verbal and written material and guidebook.  Patient learns that lab test reflects one measurement whose values change over time and are influenced by many factors, including medication, stress, sleep, exercise, food, hydration, pre-existing medical conditions, and more. It is recommended to use the knowledge from this video to become more involved with your lab results and evaluate your numbers to speak with your doctor.   Diseases of Our Time - Overview  Clinical staff conducted group or individual video education with verbal and written material and guidebook.  Patient learns that according to the CDC, 50% to 70% of chronic diseases (such as obesity, type 2 diabetes, elevated lipids, hypertension, and heart disease) are avoidable through lifestyle improvements including healthier food choices, listening to satiety cues, and increased physical activity.  Sleep Disorders Clinical staff conducted group or individual video education with verbal and written material and guidebook.  Patient learns how good quality and duration of sleep are important to overall health and well-being. Patient also learns about sleep disorders and how they impact health  along with recommendations to address them, including discussing with a physician.  Nutrition  Dining Out - Part 2 Clinical staff conducted group or individual video education with verbal and written material and guidebook.  Patient learns how to plan ahead and communicate in order to maximize their dining experience in a healthy and nutritious manner. Included are recommended food choices based on the type of restaurant the patient is visiting.   Fueling a Banker conducted group or individual video education with verbal and written material and guidebook.  There is a strong connection between our food choices and our health. Diseases like obesity and type 2 diabetes are very prevalent and are in large-part due to lifestyle choices. The Pritikin Eating Plan provides plenty of food and hunger-curbing satisfaction. It is easy to follow, affordable, and helps reduce health risks.  Menu Workshop  Clinical staff conducted group or individual video education with verbal and written material and guidebook.  Patient learns that restaurant meals can sabotage health goals because they are often packed with calories, fat, sodium, and sugar. Recommendations include strategies to plan ahead and to communicate with the manager, chef, or server to help order a healthier  meal.  Planning Your Eating Strategy  Clinical staff conducted group or individual video education with verbal and written material and guidebook.  Patient learns about the Pritikin Eating Plan and its benefit of reducing the risk of disease. The Pritikin Eating Plan does not focus on calories. Instead, it emphasizes high-quality, nutrient-rich foods. By knowing the characteristics of the foods, we choose, we can determine their calorie density and make informed decisions.  Targeting Your Nutrition Priorities  Clinical staff conducted group or individual video education with verbal and written material and guidebook.   Patient learns that lifestyle habits have a tremendous impact on disease risk and progression. This video provides eating and physical activity recommendations based on your personal health goals, such as reducing LDL cholesterol, losing weight, preventing or controlling type 2 diabetes, and reducing high blood pressure.  Vitamins and Minerals  Clinical staff conducted group or individual video education with verbal and written material and guidebook.  Patient learns different ways to obtain key vitamins and minerals, including through a recommended healthy diet. It is important to discuss all supplements you take with your doctor.   Healthy Mind-Set    Smoking Cessation  Clinical staff conducted group or individual video education with verbal and written material and guidebook.  Patient learns that cigarette smoking and tobacco addiction pose a serious health risk which affects millions of people. Stopping smoking will significantly reduce the risk of heart disease, lung disease, and many forms of cancer. Recommended strategies for quitting are covered, including working with your doctor to develop a successful plan.  Culinary   Becoming a Set Designer conducted group or individual video education with verbal and written material and guidebook.  Patient learns that cooking at home can be healthy, cost-effective, quick, and puts them in control. Keys to cooking healthy recipes will include looking at your recipe, assessing your equipment needs, planning ahead, making it simple, choosing cost-effective seasonal ingredients, and limiting the use of added fats, salts, and sugars.  Cooking - Breakfast and Snacks  Clinical staff conducted group or individual video education with verbal and written material and guidebook.  Patient learns how important breakfast is to satiety and nutrition through the entire day. Recommendations include key foods to eat during breakfast to help  stabilize blood sugar levels and to prevent overeating at meals later in the day. Planning ahead is also a key component.  Cooking - Educational Psychologist conducted group or individual video education with verbal and written material and guidebook.  Patient learns eating strategies to improve overall health, including an approach to cook more at home. Recommendations include thinking of animal protein as a side on your plate rather than center stage and focusing instead on lower calorie dense options like vegetables, fruits, whole grains, and plant-based proteins, such as beans. Making sauces in large quantities to freeze for later and leaving the skin on your vegetables are also recommended to maximize your experience.  Cooking - Healthy Salads and Dressing Clinical staff conducted group or individual video education with verbal and written material and guidebook.  Patient learns that vegetables, fruits, whole grains, and legumes are the foundations of the Pritikin Eating Plan. Recommendations include how to incorporate each of these in flavorful and healthy salads, and how to create homemade salad dressings. Proper handling of ingredients is also covered. Cooking - Soups and [employer] - Soups and Desserts Clinical staff conducted group or individual video education with verbal and written material  and guidebook.  Patient learns that Pritikin soups and desserts make for easy, nutritious, and delicious snacks and meal components that are low in sodium, fat, sugar, and calorie density, while high in vitamins, minerals, and filling fiber. Recommendations include simple and healthy ideas for soups and desserts.   Overview     The Pritikin Solution Program Overview Clinical staff conducted group or individual video education with verbal and written material and guidebook.  Patient learns that the results of the Pritikin Program have been documented in more than 100 articles published  in peer-reviewed journals, and the benefits include reducing risk factors for (and, in some cases, even reversing) high cholesterol, high blood pressure, type 2 diabetes, obesity, and more! An overview of the three key pillars of the Pritikin Program will be covered: eating well, doing regular exercise, and having a healthy mind-set.  WORKSHOPS  Exercise: Exercise Basics: Building Your Action Plan Clinical staff led group instruction and group discussion with PowerPoint presentation and patient guidebook. To enhance the learning environment the use of posters, models and videos may be added. At the conclusion of this workshop, patients will comprehend the difference between physical activity and exercise, as well as the benefits of incorporating both, into their routine. Patients will understand the FITT (Frequency, Intensity, Time, and Type) principle and how to use it to build an exercise action plan. In addition, safety concerns and other considerations for exercise and cardiac rehab will be addressed by the presenter. The purpose of this lesson is to promote a comprehensive and effective weekly exercise routine in order to improve patients overall level of fitness.   Managing Heart Disease: Your Path to a Healthier Heart Clinical staff led group instruction and group discussion with PowerPoint presentation and patient guidebook. To enhance the learning environment the use of posters, models and videos may be added.At the conclusion of this workshop, patients will understand the anatomy and physiology of the heart. Additionally, they will understand how Pritikins three pillars impact the risk factors, the progression, and the management of heart disease.  The purpose of this lesson is to provide a high-level overview of the heart, heart disease, and how the Pritikin lifestyle positively impacts risk factors.  Exercise Biomechanics Clinical staff led group instruction and group discussion  with PowerPoint presentation and patient guidebook. To enhance the learning environment the use of posters, models and videos may be added. Patients will learn how the structural parts of their bodies function and how these functions impact their daily activities, movement, and exercise. Patients will learn how to promote a neutral spine, learn how to manage pain, and identify ways to improve their physical movement in order to promote healthy living. The purpose of this lesson is to expose patients to common physical limitations that impact physical activity. Participants will learn practical ways to adapt and manage aches and pains, and to minimize their effect on regular exercise. Patients will learn how to maintain good posture while sitting, walking, and lifting.  Balance Training and Fall Prevention  Clinical staff led group instruction and group discussion with PowerPoint presentation and patient guidebook. To enhance the learning environment the use of posters, models and videos may be added. At the conclusion of this workshop, patients will understand the importance of their sensorimotor skills (vision, proprioception, and the vestibular system) in maintaining their ability to balance as they age. Patients will apply a variety of balancing exercises that are appropriate for their current level of function. Patients will understand the common causes  for poor balance, possible solutions to these problems, and ways to modify their physical environment in order to minimize their fall risk. The purpose of this lesson is to teach patients about the importance of maintaining balance as they age and ways to minimize their risk of falling.  WORKSHOPS   Nutrition:  Fueling a Ship Broker led group instruction and group discussion with PowerPoint presentation and patient guidebook. To enhance the learning environment the use of posters, models and videos may be added. Patients will  review the foundational principles of the Pritikin Eating Plan and understand what constitutes a serving size in each of the food groups. Patients will also learn Pritikin-friendly foods that are better choices when away from home and review make-ahead meal and snack options. Calorie density will be reviewed and applied to three nutrition priorities: weight maintenance, weight loss, and weight gain. The purpose of this lesson is to reinforce (in a group setting) the key concepts around what patients are recommended to eat and how to apply these guidelines when away from home by planning and selecting Pritikin-friendly options. Patients will understand how calorie density may be adjusted for different weight management goals.  Mindful Eating  Clinical staff led group instruction and group discussion with PowerPoint presentation and patient guidebook. To enhance the learning environment the use of posters, models and videos may be added. Patients will briefly review the concepts of the Pritikin Eating Plan and the importance of low-calorie dense foods. The concept of mindful eating will be introduced as well as the importance of paying attention to internal hunger signals. Triggers for non-hunger eating and techniques for dealing with triggers will be explored. The purpose of this lesson is to provide patients with the opportunity to review the basic principles of the Pritikin Eating Plan, discuss the value of eating mindfully and how to measure internal cues of hunger and fullness using the Hunger Scale. Patients will also discuss reasons for non-hunger eating and learn strategies to use for controlling emotional eating.  Targeting Your Nutrition Priorities Clinical staff led group instruction and group discussion with PowerPoint presentation and patient guidebook. To enhance the learning environment the use of posters, models and videos may be added. Patients will learn how to determine their genetic  susceptibility to disease by reviewing their family history. Patients will gain insight into the importance of diet as part of an overall healthy lifestyle in mitigating the impact of genetics and other environmental insults. The purpose of this lesson is to provide patients with the opportunity to assess their personal nutrition priorities by looking at their family history, their own health history and current risk factors. Patients will also be able to discuss ways of prioritizing and modifying the Pritikin Eating Plan for their highest risk areas  Menu  Clinical staff led group instruction and group discussion with PowerPoint presentation and patient guidebook. To enhance the learning environment the use of posters, models and videos may be added. Using menus brought in from [employer], or printed from [employer], patients will apply the Pritikin dining out guidelines that were presented in the [employer] video. Patients will also be able to practice these guidelines in a variety of provided scenarios. The purpose of this lesson is to provide patients with the opportunity to practice hands-on learning of the Pritikin Dining Out guidelines with actual menus and practice scenarios.  Label Reading Clinical staff led group instruction and group discussion with PowerPoint presentation and patient guidebook. To enhance  the learning environment the use of posters, models and videos may be added. Patients will review and discuss the Pritikin label reading guidelines presented in Pritikins Label Reading Educational series video. Using fool labels brought in from local grocery stores and markets, patients will apply the label reading guidelines and determine if the packaged food meet the Pritikin guidelines. The purpose of this lesson is to provide patients with the opportunity to review, discuss, and practice hands-on learning of the Pritikin Label Reading guidelines with actual  packaged food labels. Cooking School  Pritikins [employer] are designed to teach patients ways to prepare quick, simple, and affordable recipes at home. The importance of nutritions role in chronic disease risk reduction is reflected in its emphasis in the overall Pritikin program. By learning how to prepare essential core Pritikin Eating Plan recipes, patients will increase control over what they eat; be able to customize the flavor of foods without the use of added salt, sugar, or fat; and improve the quality of the food they consume. By learning a set of core recipes which are easily assembled, quickly prepared, and affordable, patients are more likely to prepare more healthy foods at home. These workshops focus on convenient breakfasts, simple entres, side dishes, and desserts which can be prepared with minimal effort and are consistent with nutrition recommendations for cardiovascular risk reduction. Cooking [employer] are taught by a armed forces logistics/support/administrative officer (RD) who has been trained by the [employer]. The chef or RD has a clear understanding of the importance of minimizing - if not completely eliminating - added fat, sugar, and sodium in recipes. Throughout the series of Cooking School Workshop sessions, patients will learn about healthy ingredients and efficient methods of cooking to build confidence in their capability to prepare    Cooking School weekly topics:  Adding Flavor- Sodium-Free  Fast and Healthy Breakfasts  Powerhouse Plant-Based Proteins  Satisfying Salads and Dressings  Simple Sides and Sauces  International Cuisine-Spotlight on the [employer] Zones  Delicious Desserts  Savory Soups  [employer] - Meals in a Astronomer Appetizers and Snacks  Comforting Weekend Breakfasts  One-Pot Wonders   Fast Evening Meals  Landscape Architect Your Pritikin Plate  WORKSHOPS   Healthy Mindset (Psychosocial):  Focused Goals,  Sustainable Changes Clinical staff led group instruction and group discussion with PowerPoint presentation and patient guidebook. To enhance the learning environment the use of posters, models and videos may be added. Patients will be able to apply effective goal setting strategies to establish at least one personal goal, and then take consistent, meaningful action toward that goal. They will learn to identify common barriers to achieving personal goals and develop strategies to overcome them. Patients will also gain an understanding of how our mind-set can impact our ability to achieve goals and the importance of cultivating a positive and growth-oriented mind-set. The purpose of this lesson is to provide patients with a deeper understanding of how to set and achieve personal goals, as well as the tools and strategies needed to overcome common obstacles which may arise along the way.  From Head to Heart: The Power of a Healthy Outlook  Clinical staff led group instruction and group discussion with PowerPoint presentation and patient guidebook. To enhance the learning environment the use of posters, models and videos may be added. Patients will be able to recognize and describe the impact of emotions and mood on physical health. They will discover the importance of self-care and  explore self-care practices which may work for them. Patients will also learn how to utilize the 4 Cs to cultivate a healthier outlook and better manage stress and challenges. The purpose of this lesson is to demonstrate to patients how a healthy outlook is an essential part of maintaining good health, especially as they continue their cardiac rehab journey.  Healthy Sleep for a Healthy Heart Clinical staff led group instruction and group discussion with PowerPoint presentation and patient guidebook. To enhance the learning environment the use of posters, models and videos may be added. At the conclusion of this workshop, patients  will be able to demonstrate knowledge of the importance of sleep to overall health, well-being, and quality of life. They will understand the symptoms of, and treatments for, common sleep disorders. Patients will also be able to identify daytime and nighttime behaviors which impact sleep, and they will be able to apply these tools to help manage sleep-related challenges. The purpose of this lesson is to provide patients with a general overview of sleep and outline the importance of quality sleep. Patients will learn about a few of the most common sleep disorders. Patients will also be introduced to the concept of sleep hygiene, and discover ways to self-manage certain sleeping problems through simple daily behavior changes. Finally, the workshop will motivate patients by clarifying the links between quality sleep and their goals of heart-healthy living.   Recognizing and Reducing Stress Clinical staff led group instruction and group discussion with PowerPoint presentation and patient guidebook. To enhance the learning environment the use of posters, models and videos may be added. At the conclusion of this workshop, patients will be able to understand the types of stress reactions, differentiate between acute and chronic stress, and recognize the impact that chronic stress has on their health. They will also be able to apply different coping mechanisms, such as reframing negative self-talk. Patients will have the opportunity to practice a variety of stress management techniques, such as deep abdominal breathing, progressive muscle relaxation, and/or guided imagery.  The purpose of this lesson is to educate patients on the role of stress in their lives and to provide healthy techniques for coping with it.  Learning Barriers/Preferences:  Learning Barriers/Preferences - 05/02/24 1242       Learning Barriers/Preferences   Learning Barriers None    Learning Preferences Pictoral          Education  Topics:  Knowledge Questionnaire Score:  Knowledge Questionnaire Score - 05/02/24 1059       Knowledge Questionnaire Score   Pre Score 21/24          Core Components/Risk Factors/Patient Goals at Admission:  Personal Goals and Risk Factors at Admission - 05/02/24 1028       Core Components/Risk Factors/Patient Goals on Admission    Weight Management Yes;Weight Maintenance    Intervention Weight Management: Develop a combined nutrition and exercise program designed to reach desired caloric intake, while maintaining appropriate intake of nutrient and fiber, sodium and fats, and appropriate energy expenditure required for the weight goal.;Weight Management: Provide education and appropriate resources to help participant work on and attain dietary goals.    Expected Outcomes Short Term: Continue to assess and modify interventions until short term weight is achieved;Long Term: Adherence to nutrition and physical activity/exercise program aimed toward attainment of established weight goal;Weight Maintenance: Understanding of the daily nutrition guidelines, which includes 25-35% calories from fat, 7% or less cal from saturated fats, less than 200mg  cholesterol, less than 1.5gm of  sodium, & 5 or more servings of fruits and vegetables daily;Understanding recommendations for meals to include 15-35% energy as protein, 25-35% energy from fat, 35-60% energy from carbohydrates, less than 200mg  of dietary cholesterol, 20-35 gm of total fiber daily    Hypertension Yes    Intervention Provide education on lifestyle modifcations including regular physical activity/exercise, weight management, moderate sodium restriction and increased consumption of fresh fruit, vegetables, and low fat dairy, alcohol moderation, and smoking cessation.;Monitor prescription use compliance.    Expected Outcomes Short Term: Continued assessment and intervention until BP is < 140/23mm HG in hypertensive participants. < 130/84mm HG in  hypertensive participants with diabetes, heart failure or chronic kidney disease.;Long Term: Maintenance of blood pressure at goal levels.    Lipids Yes    Intervention Provide education and support for participant on nutrition & aerobic/resistive exercise along with prescribed medications to achieve LDL 70mg , HDL >40mg .    Expected Outcomes Short Term: Participant states understanding of desired cholesterol values and is compliant with medications prescribed. Participant is following exercise prescription and nutrition guidelines.;Long Term: Cholesterol controlled with medications as prescribed, with individualized exercise RX and with personalized nutrition plan. Value goals: LDL < 70mg , HDL > 40 mg.          Core Components/Risk Factors/Patient Goals Review:    Core Components/Risk Factors/Patient Goals at Discharge (Final Review):    ITP Comments:  ITP Comments     Row Name 05/02/24 1021           ITP Comments Medical Director- Dr. Wilbert Bihari, MD. Introduction to the Pritikin Education/ Intensive Cardiac Rehab Program. Reviewed initial orientation folder with Kathryn Chavez.          Comments: Mychele attended orientation for the cardiac rehabilitation program on  05/02/2024  to perform initial intake and exercise walk test. Patient introduced to the Pritikin Program education and orientation packet was reviewed. Completed 6-minute walk test, measurements, initial ITP, and exercise prescription. Vital signs stable. Telemetry-normal sinus rhythm with a first degree heart block this has been previously noted,occasional PAC present.  Kathryn Chavez reported having slight shortness of breath during the walk test, otherwise asymptomatic.Kathryn Chavez Elpidio Quan RN BSN   Service time was from 1021 to 1157.        [1]  Current Outpatient Medications:    ALPRAZolam  (XANAX ) 0.5 MG tablet, TAKE 1/2 - 1 TABLET ONCE A DAY FOR ANXIETY, SLEEP (LIMIT TO LESS THAN 5 DAYS A WEEK), Disp: 25 tablet, Rfl: 0    aspirin  EC 81 MG tablet, Take 81 mg by mouth daily. Swallow whole., Disp: , Rfl:    buPROPion  (WELLBUTRIN  XL) 300 MG 24 hr tablet, TAKE ONE TABLET BY MOUTH EVERY MORNING FOR MOOD, ANXIETY, FOCUS AND CONCENTRATION, Disp: 90 tablet, Rfl: 3   Cholecalciferol (VITAMIN D ) 125 MCG (5000 UT) CAPS, Take 5,000 Units by mouth daily., Disp: , Rfl:    cyanocobalamin  (VITAMIN B12) 500 MCG tablet, Take 500 mcg by mouth daily., Disp: , Rfl:    furosemide  (LASIX ) 20 MG tablet, Take 0.5 tablets (10 mg total) by mouth daily., Disp: 45 tablet, Rfl: 3   gabapentin  (NEURONTIN ) 100 MG capsule, TAKE ONE CAPSULE THREE TIMES DAILY., Disp: 270 capsule, Rfl: 3   ipratropium (ATROVENT ) 0.03 % nasal spray, Place 2 sprays into the nose 3 (three) times daily., Disp: 30 mL, Rfl: 2   rosuvastatin  (CRESTOR ) 5 MG tablet, TAKE ONE TABLET ONCE DAILY FOR CHOLESTEROL, Disp: 90 tablet, Rfl: 1 [2]  Social History Tobacco Use  Smoking Status  Never  Smokeless Tobacco Never

## 2024-05-02 NOTE — Progress Notes (Signed)
 Cardiac Rehab Medication Review by a nurse  Does the patient  feel that his/her medications are working for him/her?  yes  Has the patient been experiencing any side effects to the medications prescribed?  no  Does the patient measure his/her own blood pressure or blood glucose at home?  yes   Does the patient have any problems obtaining medications due to transportation or finances?   no  Understanding of regimen: good Understanding of indications: good Potential of compliance: good    Nurse comments: Kathryn Chavez is taking her medications as prescribed. Kathryn Chavez has a blood pressure cuff. Kathryn Chavez does not check her blood pressures on a regular basis.    Kathryn Chavez [facility] RN 05/02/2024 10:35 AM

## 2024-05-04 ENCOUNTER — Ambulatory Visit: Attending: Physician Assistant | Admitting: Physician Assistant

## 2024-05-04 VITALS — BP 128/78 | HR 83 | Ht 66.0 in | Wt 128.0 lb

## 2024-05-04 DIAGNOSIS — I350 Nonrheumatic aortic (valve) stenosis: Secondary | ICD-10-CM | POA: Diagnosis not present

## 2024-05-04 DIAGNOSIS — R161 Splenomegaly, not elsewhere classified: Secondary | ICD-10-CM | POA: Diagnosis not present

## 2024-05-04 DIAGNOSIS — I447 Left bundle-branch block, unspecified: Secondary | ICD-10-CM

## 2024-05-04 DIAGNOSIS — I10 Essential (primary) hypertension: Secondary | ICD-10-CM

## 2024-05-04 DIAGNOSIS — Z952 Presence of prosthetic heart valve: Secondary | ICD-10-CM | POA: Diagnosis not present

## 2024-05-04 MED ORDER — AZITHROMYCIN 500 MG PO TABS: 500.0000 mg | ORAL_TABLET | ORAL | 12 refills | Status: AC

## 2024-05-04 NOTE — Progress Notes (Signed)
 " [facility]   MULTIDISCIPLINARY HEART VALVE CLINIC                                     Cardiology Office Note:    Date:  05/04/2024   ID:  Kathryn Chavez, DOB 06-23-1950, MRN [id number]  PCP:  Laurice President, NP  [facility] Cardiologist:  Ozell Fell, MD  [facility] Structural heart: Ozell Fell, MD [facility] Electrophysiologist:  None   Referring MD: Laurice President, NP   1 month s/p TAVR  History of Present Illness:    Kathryn Chavez is a 74 y.o. female with a hx of  HTN, HLD, IRBBB, breast cancer s/p chemo/XRT and severe aortic stenosis s/p TAVR (03/21/24) who presents to clinic for follow up.   Echo 10/29/23 showed normal EF 60%, mean grad 33 mmHg, peak grad 48.3 mmHg, AVA 0.96 cm2, DVI 0.27, SVI 46, mild AI. Cardiac cath 12/16/23 showed angiographically normal coronary arteries. She reported fatigue and exertional dyspnea. S/p successful TAVR with a 23 mm Edwards Sapien 3 Ultra Resilia THV via the TF approach on 03/21/24. Pt developed a new LBBB s/p TAVR and Zio AT showed Mobitz type II. She has been referred to EP. 1 month echo 05/01/24 showed EF 55%, normally functioning TAVR with a mean gradient of 12.5 mmHg and no PVL.   Today the patient presents to clinic for follow up. No CP. Only gets a little SOB and fatigue by the end of the day. She stays quite busy all day. No LE edema, orthopnea or PND. No dizziness or syncope. No blood in stool or urine. No palpitations. She can tell a big difference in her breathing, especially when walking up inclines.     Past Medical History:  Diagnosis Date   Allergy    rhinitis   Breast cancer (HCC)    Hyperlipidemia    Hypertension    Personal history of chemotherapy 2000   Personal history of radiation therapy 2000   S/P TAVR (transcatheter aortic valve replacement) 03/21/2024   s/p TAVR with a 23 mm Edwards Sapien 3 Ultra Resilia THV via the TF approach by Dr. Fell and Dr. Daniel   Severe aortic stenosis       Current Medications: Active Medications[1]    ROS:   Please see the history of present illness.    All other systems reviewed and are negative.  EKGs       Risk Assessment/Calculations:           Physical Exam:    VS:  BP 128/78   Pulse 83   Ht 5' 6 (1.676 m)   Wt 128 lb (58.1 kg)   SpO2 99%   BMI 20.66 kg/m     Wt Readings from Last 3 Encounters:  05/04/24 128 lb (58.1 kg)  05/02/24 127 lb 3.3 oz (57.7 kg)  03/31/24 125 lb 9.6 oz (57 kg)     GEN: Well nourished, well developed in no acute distress NECK: No JVD CARDIAC: RRR, very soft flow murmurs @RUSB . No rubs, gallops RESPIRATORY:  Clear to auscultation without rales, wheezing or rhonchi  ABDOMEN: Soft, non-tender, non-distended EXTREMITIES:  No edema; No deformity.    ASSESSMENT:    1. S/P TAVR (transcatheter aortic valve replacement)   2. LBBB (left bundle branch block)   3. Essential hypertension   4. Splenomegaly   5. Severe aortic stenosis  PLAN:    In order of problems listed above:  Severe AS s/p TAVR:  -- Echo showed 05/01/24 showed EF 55%, normally functioning TAVR with a mean gradient of 12.5 mmHg and no PVL.  -- NYHA class I symptoms with a marked improvement in symptoms since TAVR. -- Continue Aspirin  81mg  daily.  -- SBE prophylaxis discussed; I have RX'd azithromycin  due to a PCN allergy.  -- I will see back for 1 year office visit with echo.  New LBBB/Mobitz II: -- Pt developed a new LBBB s/p TAVR and Zio AT showed Mobitz type II- this was asymptomatic.  -- Seeing Dr. Inocencio with EP on 05/16/24.   HTN: -- BP well controlled.  -- Stop lasix  5 mg daily as it is difficult to cut the pills in half and likely with negligible effect.    Splenomegaly: -- Pre TAVR CTs showed moderate splenomegaly with estimated volume of approximately 715 mL. -- This is followed by Dr Deanne managing.   Medication Adjustments/Labs and Tests Ordered: Current medicines are reviewed at  length with the patient today.  Concerns regarding medicines are outlined above.  Orders Placed This Encounter  Procedures   ECHOCARDIOGRAM COMPLETE   Meds ordered this encounter  Medications   azithromycin  (ZITHROMAX ) 500 MG tablet    Sig: Take 1 tablet (500 mg total) by mouth as directed. Take one tablet 1 hour before any dental work including cleanings.    Dispense:  6 tablet    Refill:  12    Supervising Provider:   WONDA SHARPER [[id number]]    Patient Instructions  Medication Instructions:  Your physician has recommended you make the following change in your medication:  STOP Lasix . Start Azithromycin  500 mg, take 1 tablet by mouth 1 hour prior to dental procedures and cleanings.    *If you need a refill on your cardiac medications before your next appointment, please call your pharmacy*  Lab Work: None needed If you have labs (blood work) drawn today and your tests are completely normal, you will receive your results only by: MyChart Message (if you have MyChart) OR A paper copy in the mail If you have any lab test that is abnormal or we need to change your treatment, we will call you to review the results.  Testing/Procedures: 03/19/2025 Your physician has requested that you have an echocardiogram. Echocardiography is a painless test that uses sound waves to create images of your heart. It provides your doctor with information about the size and shape of your heart and how well your hearts chambers and valves are working. This procedure takes approximately one hour. There are no restrictions for this procedure. Please do NOT wear cologne, perfume, aftershave, or lotions (deodorant is allowed). Please arrive 15 minutes prior to your appointment time.  Please note: We ask at that you not bring children with you during ultrasound (echo/ vascular) testing. Due to room size and safety concerns, children are not allowed in the ultrasound rooms during exams. Our front office staff  cannot provide observation of children in our lobby area while testing is being conducted. An adult accompanying a patient to their appointment will only be allowed in the ultrasound room at the discretion of the ultrasound technician under special circumstances. We apologize for any inconvenience.   Follow-Up: At [facility], you and your health needs are our priority.  As part of our continuing mission to provide you with exceptional heart care, our providers are all part of one team.  This  team includes your primary Cardiologist (physician) and Advanced Practice Providers or APPs (Physician Assistants and Nurse Practitioners) who all work together to provide you with the care you need, when you need it.  Your next appointment:   As scheduled on 05/16/24  Provider:   Dr. Soyla Norton  We recommend signing up for the patient portal called MyChart.  Sign up information is provided on this After Visit Summary.  MyChart is used to connect with patients for Virtual Visits (Telemedicine).  Patients are able to view lab/test results, encounter notes, upcoming appointments, etc.  Non-urgent messages can be sent to your provider as well.   To learn more about what you can do with MyChart, go to [email or web address].   Other Instructions             Signed, Kathryn Hummer, PA-C  05/04/2024 8:51 PM    [facility]     [1]  Current Meds  Medication Sig   ALPRAZolam  (XANAX ) 0.5 MG tablet TAKE 1/2 - 1 TABLET ONCE A DAY FOR ANXIETY, SLEEP (LIMIT TO LESS THAN 5 DAYS A WEEK)   aspirin  EC 81 MG tablet Take 81 mg by mouth daily. Swallow whole.   azithromycin  (ZITHROMAX ) 500 MG tablet Take 1 tablet (500 mg total) by mouth as directed. Take one tablet 1 hour before any dental work including cleanings.   buPROPion  (WELLBUTRIN  XL) 300 MG 24 hr tablet TAKE ONE TABLET BY MOUTH EVERY MORNING FOR MOOD, ANXIETY, FOCUS AND CONCENTRATION   Cholecalciferol (VITAMIN D ) 125  MCG (5000 UT) CAPS Take 5,000 Units by mouth daily.   cyanocobalamin  (VITAMIN B12) 500 MCG tablet Take 500 mcg by mouth daily.   gabapentin  (NEURONTIN ) 100 MG capsule TAKE ONE CAPSULE THREE TIMES DAILY.   ipratropium (ATROVENT ) 0.03 % nasal spray Place 2 sprays into the nose 3 (three) times daily.   rosuvastatin  (CRESTOR ) 5 MG tablet TAKE ONE TABLET ONCE DAILY FOR CHOLESTEROL   [DISCONTINUED] furosemide  (LASIX ) 20 MG tablet Take 0.5 tablets (10 mg total) by mouth daily.   "

## 2024-05-04 NOTE — Patient Instructions (Signed)
 Medication Instructions:  Your physician has recommended you make the following change in your medication:  STOP Lasix . Start Azithromycin  500 mg, take 1 tablet by mouth 1 hour prior to dental procedures and cleanings.    *If you need a refill on your cardiac medications before your next appointment, please call your pharmacy*  Lab Work: None needed If you have labs (blood work) drawn today and your tests are completely normal, you will receive your results only by: MyChart Message (if you have MyChart) OR A paper copy in the mail If you have any lab test that is abnormal or we need to change your treatment, we will call you to review the results.  Testing/Procedures: 03/19/2025 Your physician has requested that you have an echocardiogram. Echocardiography is a painless test that uses sound waves to create images of your heart. It provides your doctor with information about the size and shape of your heart and how well your hearts chambers and valves are working. This procedure takes approximately one hour. There are no restrictions for this procedure. Please do NOT wear cologne, perfume, aftershave, or lotions (deodorant is allowed). Please arrive 15 minutes prior to your appointment time.  Please note: We ask at that you not bring children with you during ultrasound (echo/ vascular) testing. Due to room size and safety concerns, children are not allowed in the ultrasound rooms during exams. Our front office staff cannot provide observation of children in our lobby area while testing is being conducted. An adult accompanying a patient to their appointment will only be allowed in the ultrasound room at the discretion of the ultrasound technician under special circumstances. We apologize for any inconvenience.   Follow-Up: At [facility], you and your health needs are our priority.  As part of our continuing mission to provide you with exceptional heart care, our providers are all part  of one team.  This team includes your primary Cardiologist (physician) and Advanced Practice Providers or APPs (Physician Assistants and Nurse Practitioners) who all work together to provide you with the care you need, when you need it.  Your next appointment:   As scheduled on 05/16/24  Provider:   Dr. Soyla Norton  We recommend signing up for the patient portal called MyChart.  Sign up information is provided on this After Visit Summary.  MyChart is used to connect with patients for Virtual Visits (Telemedicine).  Patients are able to view lab/test results, encounter notes, upcoming appointments, etc.  Non-urgent messages can be sent to your provider as well.   To learn more about what you can do with MyChart, go to [email or web address].   Other Instructions

## 2024-05-08 ENCOUNTER — Encounter (HOSPITAL_COMMUNITY)
Admission: RE | Admit: 2024-05-08 | Discharge: 2024-05-08 | Disposition: A | Source: Ambulatory Visit | Attending: Cardiovascular Disease

## 2024-05-08 DIAGNOSIS — Z952 Presence of prosthetic heart valve: Secondary | ICD-10-CM

## 2024-05-08 DIAGNOSIS — Z48812 Encounter for surgical aftercare following surgery on the circulatory system: Secondary | ICD-10-CM | POA: Diagnosis not present

## 2024-05-08 NOTE — Progress Notes (Signed)
 Daily Session Note  Patient Details  Name: Kathryn Chavez MRN: [id number] Date of Birth: 15-Oct-1950 Referring Provider:   Flowsheet Row INTENSIVE CARDIAC REHAB ORIENT from 05/02/2024 in [facility]  Referring Provider Delford Maude BROCKS, MD    Encounter Date: 05/08/2024  Check In:  Session Check In - 05/08/24 0830       Check-In   Supervising physician immediately available to respond to emergencies [facility] MD immediately available    Physician(s) Lum Louis, NP    Location [facility]    Staff Present Hadassah Quan, RN, Avonne Gal, MS, ACSM-CEP, Exercise Physiologist;Joseph Lennon, RN, Mallory Parkins, MS, ACSM-CEP, CCRP, Exercise Physiologist    Virtual Visit No    Medication changes reported     No    Fall or balance concerns reported    No    Tobacco Cessation No Change    Warm-up and Cool-down Not performed (comment)   Cardiac Rehab Orientation   Resistance Training Performed Yes    VAD Patient? No    PAD/SET Patient? No      Pain Assessment   Currently in Pain? No/denies    Pain Score 0-No pain    Multiple Pain Sites No          Capillary Blood Glucose: No results found for this or any previous visit (from the past 24 hours).   Exercise Prescription Changes - 05/08/24 1200       Response to Exercise   Blood Pressure (Admit) 110/82    Blood Pressure (Exercise) 138/80    Blood Pressure (Exit) 102/70    Heart Rate (Admit) 69 bpm    Heart Rate (Exercise) 106 bpm    Heart Rate (Exit) 78 bpm    Rating of Perceived Exertion (Exercise) 11    Symptoms None    Comments Pt's first day in the CRP2 program    Duration Continue with 30 min of aerobic exercise without signs/symptoms of physical distress.    Intensity THRR unchanged      Progression   Progression Continue to progress workloads to maintain intensity without signs/symptoms of physical distress.    Average METs 3.35       Resistance Training   Weight 2 lbs    Reps 10-15    Time 5 Minutes      Interval Training   Interval Training No      Treadmill   MPH 2.2    Grade 0    Minutes 15    METs 2.69      Recumbant Elliptical   Level 1    RPM 44    Watts 60    Minutes 15    METs 4          Tobacco Use History[1]  Goals Met:  Exercise tolerated well No report of concerns or symptoms today Strength training completed today  Goals Unmet:  Not Applicable  Comments: Taia started cardiac rehab today.  Pt tolerated light exercise without difficulty. VSS, telemetry-Sinus Rhythm, asymptomatic.  Medication list reconciled. Pt denies barriers to medicaiton compliance.  PSYCHOSOCIAL ASSESSMENT:  PHQ-3. Pt exhibits positive coping skills, hopeful outlook with supportive family. No psychosocial needs identified at this time, no psychosocial interventions necessary.    Pt enjoys antiquing, reading, socializing in her neighborhood  and travelling.   Pt oriented to exercise equipment and routine.    Understanding verbalized.Hadassah Elpidio Quan RN BSN     Dr. Wilbert Bihari  is Wellsite Geologist for Cardiac Rehab at [facility].     [1]  Social History Tobacco Use  Smoking Status Never  Smokeless Tobacco Never

## 2024-05-10 ENCOUNTER — Encounter (HOSPITAL_COMMUNITY)
Admission: RE | Admit: 2024-05-10 | Discharge: 2024-05-10 | Disposition: A | Source: Ambulatory Visit | Attending: Cardiovascular Disease

## 2024-05-10 DIAGNOSIS — Z952 Presence of prosthetic heart valve: Secondary | ICD-10-CM

## 2024-05-10 DIAGNOSIS — Z48812 Encounter for surgical aftercare following surgery on the circulatory system: Secondary | ICD-10-CM | POA: Diagnosis not present

## 2024-05-10 NOTE — Progress Notes (Signed)
 Cardiac Individual Treatment Plan  Patient Details  Name: Kathryn Chavez MRN: [id number] Date of Birth: 1950/09/12 Referring Provider:   Flowsheet Row INTENSIVE CARDIAC REHAB ORIENT from 05/02/2024 in [facility]  Referring Provider Delford Maude BROCKS, MD    Initial Encounter Date:  Flowsheet Row INTENSIVE CARDIAC REHAB ORIENT from 05/02/2024 in [facility]  Date 05/02/24    Visit Diagnosis: 03/21/24 TAVR  Patient's Home Medications on Admission: Current Medications[1]  Past Medical History: Past Medical History:  Diagnosis Date   Allergy    rhinitis   Breast cancer (HCC)    Hyperlipidemia    Hypertension    Personal history of chemotherapy 2000   Personal history of radiation therapy 2000   S/P TAVR (transcatheter aortic valve replacement) 03/21/2024   s/p TAVR with a 23 mm Edwards Sapien 3 Ultra Resilia THV via the TF approach by Dr. Wonda and Dr. Daniel   Severe aortic stenosis     Tobacco Use: Tobacco Use History[2]  Labs: Review Flowsheet  More data exists      Latest Ref Rng & Units 02/18/2022 06/02/2022 09/08/2022 12/15/2022 03/21/2024  Labs for ITP Cardiac and Pulmonary Rehab  Cholestrol <200 mg/dL 747  789  836  792  -  LDL (calc) mg/dL (calc) 884  70  73  82  -  HDL-C > OR = 50 mg/dL 877  876  76  889  -  Trlycerides <150 mg/dL 62  88  68  71  -  Hemoglobin A1c <5.7 % of total Hgb 5.3  - 5.4  - -  TCO2 22 - 32 mmol/L - - - - 22     Capillary Blood Glucose: No results found for: GLUCAP   Exercise Target Goals: Exercise Program Goal: Individual exercise prescription set using results from initial 6 min walk test and THRR while considering  patients activity barriers and safety.   Exercise Prescription Goal: Initial exercise prescription builds to 30-45 minutes a day of aerobic activity, 2-3 days per week.  Home exercise guidelines will be given  to patient during program as part of exercise prescription that the participant will acknowledge.  Activity Barriers & Risk Stratification:  Activity Barriers & Cardiac Risk Stratification - 05/02/24 1055       Activity Barriers & Cardiac Risk Stratification   Activity Barriers None    Cardiac Risk Stratification High          6 Minute Walk:  6 Minute Walk     Row Name 05/02/24 1124         6 Minute Walk   Phase Initial     Distance 1778 feet     Walk Time 6 minutes     # of Rest Breaks 0     MPH 3.37     METS 4.22     RPE 13     Perceived Dyspnea  1     VO2 Peak 14.77     Symptoms Yes (comment)     Comments Mild shortness of breath     Resting HR 74 bpm     Resting BP 112/70     Resting Oxygen Saturation  98 %     Exercise Oxygen Saturation  during 6 min walk 99 %     Max Ex. HR 114 bpm     Max Ex. BP 154/82     2 Minute Post  BP 122/72        Oxygen Initial Assessment:   Oxygen Re-Evaluation:   Oxygen Discharge (Final Oxygen Re-Evaluation):   Initial Exercise Prescription:  Initial Exercise Prescription - 05/02/24 1000       Date of Initial Exercise RX and Referring Provider   Date 05/02/24    Referring Provider Delford Maude BROCKS, MD    Expected Discharge Date 07/26/24      Treadmill   MPH 2.5    Grade 0    Minutes 15    METs 2.91      Recumbant Elliptical   Level 1    RPM 40    Watts 43    Minutes 15    METs 2.9      Prescription Details   Frequency (times per week) 2    Duration Progress to 30 minutes of continuous aerobic without signs/symptoms of physical distress      Intensity   THRR 40-80% of Max Heartrate 58-117    Ratings of Perceived Exertion 11-13    Perceived Dyspnea 0-4      Progression   Progression Continue to progress workloads to maintain intensity without signs/symptoms of physical distress.      Resistance Training   Training Prescription Yes    Weight 2 lbs    Reps 10-15          Perform Capillary Blood  Glucose checks as needed.  Exercise Prescription Changes:   Exercise Prescription Changes     Row Name 05/08/24 1200             Response to Exercise   Blood Pressure (Admit) 110/82       Blood Pressure (Exercise) 138/80       Blood Pressure (Exit) 102/70       Heart Rate (Admit) 69 bpm       Heart Rate (Exercise) 106 bpm       Heart Rate (Exit) 78 bpm       Rating of Perceived Exertion (Exercise) 11       Symptoms None       Comments Pt's first day in the CRP2 program       Duration Continue with 30 min of aerobic exercise without signs/symptoms of physical distress.       Intensity THRR unchanged         Progression   Progression Continue to progress workloads to maintain intensity without signs/symptoms of physical distress.       Average METs 3.35         Resistance Training   Weight 2 lbs       Reps 10-15       Time 5 Minutes         Interval Training   Interval Training No         Treadmill   MPH 2.2       Grade 0       Minutes 15       METs 2.69         Recumbant Elliptical   Level 1       RPM 44       Watts 60       Minutes 15       METs 4          Exercise Comments:   Exercise Comments     Row Name 05/08/24 1227           Exercise Comments Pt's inital session; tolerated session  well without complaints.          Exercise Goals and Review:   Exercise Goals     Row Name 05/02/24 1027             Exercise Goals   Increase Physical Activity Yes       Intervention Provide advice, education, support and counseling about physical activity/exercise needs.;Develop an individualized exercise prescription for aerobic and resistive training based on initial evaluation findings, risk stratification, comorbidities and participant's personal goals.       Expected Outcomes Short Term: Attend rehab on a regular basis to increase amount of physical activity.;Long Term: Add in home exercise to make exercise part of routine and to increase amount of  physical activity.;Long Term: Exercising regularly at least 3-5 days a week.       Increase Strength and Stamina Yes       Intervention Provide advice, education, support and counseling about physical activity/exercise needs.;Develop an individualized exercise prescription for aerobic and resistive training based on initial evaluation findings, risk stratification, comorbidities and participant's personal goals.       Expected Outcomes Short Term: Increase workloads from initial exercise prescription for resistance, speed, and METs.;Short Term: Perform resistance training exercises routinely during rehab and add in resistance training at home;Long Term: Improve cardiorespiratory fitness, muscular endurance and strength as measured by increased METs and functional capacity ( )       Able to understand and use rate of perceived exertion (RPE) scale Yes       Intervention Provide education and explanation on how to use RPE scale       Expected Outcomes Short Term: Able to use RPE daily in rehab to express subjective intensity level;Long Term:  Able to use RPE to guide intensity level when exercising independently       Knowledge and understanding of Target Heart Rate Range (THRR) Yes       Intervention Provide education and explanation of THRR including how the numbers were predicted and where they are located for reference       Expected Outcomes Short Term: Able to state/look up THRR;Long Term: Able to use THRR to govern intensity when exercising independently;Short Term: Able to use daily as guideline for intensity in rehab       Understanding of Exercise Prescription Yes       Intervention Provide education, explanation, and written materials on patient's individual exercise prescription       Expected Outcomes Short Term: Able to explain program exercise prescription;Long Term: Able to explain home exercise prescription to exercise independently          Exercise Goals Re-Evaluation :  Exercise  Goals Re-Evaluation     Row Name 05/08/24 1226             Exercise Goal Re-Evaluation   Exercise Goals Review Increase Physical Activity;Understanding of Exercise Prescription;Increase Strength and Stamina;Knowledge and understanding of Target Heart Rate Range (THRR);Able to understand and use rate of perceived exertion (RPE) scale       Comments Pt's first day in the CRP2 program. Pt understands the exercise Rx, RPE sclae and THRR.       Expected Outcomes Will continue to monitor the patient and progress exercise workloads as toelrated.          Discharge Exercise Prescription (Final Exercise Prescription Changes):  Exercise Prescription Changes - 05/08/24 1200       Response to Exercise   Blood Pressure (Admit) 110/82    Blood  Pressure (Exercise) 138/80    Blood Pressure (Exit) 102/70    Heart Rate (Admit) 69 bpm    Heart Rate (Exercise) 106 bpm    Heart Rate (Exit) 78 bpm    Rating of Perceived Exertion (Exercise) 11    Symptoms None    Comments Pt's first day in the CRP2 program    Duration Continue with 30 min of aerobic exercise without signs/symptoms of physical distress.    Intensity THRR unchanged      Progression   Progression Continue to progress workloads to maintain intensity without signs/symptoms of physical distress.    Average METs 3.35      Resistance Training   Weight 2 lbs    Reps 10-15    Time 5 Minutes      Interval Training   Interval Training No      Treadmill   MPH 2.2    Grade 0    Minutes 15    METs 2.69      Recumbant Elliptical   Level 1    RPM 44    Watts 60    Minutes 15    METs 4          Nutrition:  Target Goals: Understanding of nutrition guidelines, daily intake of sodium 1500mg , cholesterol 200mg , calories 30% from fat and 7% or less from saturated fats, daily to have 5 or more servings of fruits and vegetables.  Biometrics:  Pre Biometrics - 05/02/24 1021       Pre Biometrics   Waist Circumference 32.25  inches    Hip Circumference 37 inches    Waist to Hip Ratio 0.87 %    Triceps Skinfold 21 mm    % Body Fat 33.1 %    Grip Strength 14 kg    Flexibility 17.25 in    Single Leg Stand 15 seconds           Nutrition Therapy Plan and Nutrition Goals:  Nutrition Therapy & Goals - 05/08/24 1120       Nutrition Therapy   Diet Heart Healthy      Personal Nutrition Goals   Nutrition Goal Patient to improve diet quality by using the plate method as a guide for meal planning to include lean protein/plant protein, fruits, vegetables, whole grains, nonfat dairy as part of a well-balanced diet.    Comments Pt with medical history significant for HTN, HLD, IRBBB, breast cancer s/p chemo/XRT and severe aortic stenosis s/p TAVR (03/21/24). Healthy eating pattern based on initial PYP score of 71. Pt endorses need to restrict potato chips given significant intake. Also reports typically skipping breakfast. RD provided suggestions for alternative snacks in place of chips. Also discussed importance of eating prior to exercise classes; pt plans to try toast with peanut butter or avocado. RD reviewed main components of heart healthy diet using plate model. Patient will benefit from participation in intensive cardiac rehab for nutrition education, exercise, and lifestyle modification.      Intervention Plan   Intervention Prescribe, educate and counsel regarding individualized specific dietary modifications aiming towards targeted core components such as weight, hypertension, lipid management, diabetes, heart failure and other comorbidities.    Expected Outcomes Short Term Goal: Understand basic principles of dietary content, such as calories, fat, sodium, cholesterol and nutrients.;Long Term Goal: Adherence to prescribed nutrition plan.          Nutrition Assessments:  MEDIFICTS Score Key: >=70 Need to make dietary changes  40-70 Heart Healthy Diet <= 40  Therapeutic Level Cholesterol Diet   Flowsheet  Row INTENSIVE CARDIAC REHAB from 05/08/2024 in [facility]  Picture Your Plate Total Score on Admission 71   Picture Your Plate Scores: <59 Unhealthy dietary pattern with much room for improvement. 41-50 Dietary pattern unlikely to meet recommendations for good health and room for improvement. 51-60 More healthful dietary pattern, with some room for improvement.  >60 Healthy dietary pattern, although there may be some specific behaviors that could be improved.    Nutrition Goals Re-Evaluation:   Nutrition Goals Re-Evaluation:   Nutrition Goals Discharge (Final Nutrition Goals Re-Evaluation):   Psychosocial: Target Goals: Acknowledge presence or absence of significant depression and/or stress, maximize coping skills, provide positive support system. Participant is able to verbalize types and ability to use techniques and skills needed for reducing stress and depression.  Initial Review & Psychosocial Screening:  Initial Psych Review & Screening - 05/02/24 1240       Initial Review   Current issues with Current Sleep Concerns      Family Dynamics   Good Support System? Yes   Mccall has her signifigant other, daughter friends and neighbors for support     Barriers   Psychosocial barriers to participate in program The patient should benefit from training in stress management and relaxation.      Screening Interventions   Interventions Encouraged to exercise    Expected Outcomes Short Term goal: Identification and review with participant of any Quality of Life or Depression concerns found by scoring the questionnaire.;Long Term goal: The participant improves quality of Life and PHQ9 Scores as seen by post scores and/or verbalization of changes          Quality of Life Scores:  Quality of Life - 05/02/24 1059       Quality of Life   Select Quality of Life      Quality of Life Scores   Health/Function Pre 28.8 %     Socioeconomic Pre 30 %    Psych/Spiritual Pre 30 %    Family Pre 30 %    GLOBAL Pre 29.49 %         Scores of 19 and below usually indicate a poorer quality of life in these areas.  A difference of  2-3 points is a clinically meaningful difference.  A difference of 2-3 points in the total score of the Quality of Life Index has been associated with significant improvement in overall quality of life, self-image, physical symptoms, and general health in studies assessing change in quality of life.  PHQ-9: Review Flowsheet  More data exists      05/02/2024 12/10/2023 06/02/2022 02/22/2022 06/02/2021  Depression screen PHQ 2/9  Decreased Interest 0 0 0 0 0  Down, Depressed, Hopeless 0 0 0 0 0  PHQ - 2 Score 0 0 0 0 0  Altered sleeping 2 - - - -  Tired, decreased energy 1 - - - -  Change in appetite 0 - - - -  Feeling bad or failure about yourself  0 - - - -  Trouble concentrating 0 - - - -  Moving slowly or fidgety/restless 0 - - - -  Suicidal thoughts 0 - - - -  PHQ-9 Score 3 - - - -  Difficult doing work/chores Not difficult at all - - - -   Interpretation of Total Score  Total Score Depression Severity:  1-4 = Minimal depression, 5-9 = Mild depression,  10-14 = Moderate depression, 15-19 = Moderately severe depression, 20-27 = Severe depression   Psychosocial Evaluation and Intervention:   Psychosocial Re-Evaluation:  Psychosocial Re-Evaluation     Row Name 05/08/24 1657             Psychosocial Re-Evaluation   Current issues with Current Sleep Concerns;Current Stress Concerns       Comments Glady started cardiac rehab on 05/08/24. Ryenn said during cardiac rehab orientation that she has had difficulty sleeping for a while. Reviewed sleep hygiene. Kryssa says she has been taking xanax  for a while. Destinee says she is still experiencing some shortness of breath post Valve surgery.  Serenna said she would discuss this with Lamarr Hummer at her offiice follow up visit.        Expected Outcomes Naraya will have controlled or decreased stress upon completion of cardiac rehab       Interventions Stress management education;Encouraged to attend Cardiac Rehabilitation for the exercise;Relaxation education       Continue Psychosocial Services  No Follow up required         Initial Review   Source of Stress Concerns Chronic Illness       Comments Will continue to monitor and offer support as needed.          Psychosocial Discharge (Final Psychosocial Re-Evaluation):  Psychosocial Re-Evaluation - 05/08/24 1657       Psychosocial Re-Evaluation   Current issues with Current Sleep Concerns;Current Stress Concerns    Comments Imagene started cardiac rehab on 05/08/24. Alexah said during cardiac rehab orientation that she has had difficulty sleeping for a while. Reviewed sleep hygiene. Elainna says she has been taking xanax  for a while. Dalene says she is still experiencing some shortness of breath post Valve surgery.  Manroop said she would discuss this with Lamarr Hummer at her offiice follow up visit.    Expected Outcomes Samina will have controlled or decreased stress upon completion of cardiac rehab    Interventions Stress management education;Encouraged to attend Cardiac Rehabilitation for the exercise;Relaxation education    Continue Psychosocial Services  No Follow up required      Initial Review   Source of Stress Concerns Chronic Illness    Comments Will continue to monitor and offer support as needed.          Vocational Rehabilitation: Provide vocational rehab assistance to qualifying candidates.   Vocational Rehab Evaluation & Intervention:  Vocational Rehab - 05/02/24 1054       Initial Vocational Rehab Evaluation & Intervention   Assessment shows need for Vocational Rehabilitation No   Alishia plans to return to work after participating in cardiac rehab and does not need vocational rehab at this time         Education: Education Goals:  Education classes will be provided on a weekly basis, covering required topics. Participant will state understanding/return demonstration of topics presented.    Education     Row Name 05/08/24 0800     Education   Cardiac Education Topics Pritikin   Writer General Education   General Education Heart Disease Risk Reduction   Instruction Review Code 1- Verbalizes Understanding   Class Start Time 0820   Class Stop Time [phone]   Class Time Calculation (min) 32 min    Row Name 05/10/24 0800     Education   Cardiac Education Topics Pritikin   [employer]  Retail Banker   Instruction Review Code 1- Verbalizes Understanding   Class Start Time 0815   Class Stop Time [id number]   Class Time Calculation (min) 38 min      Core Videos: Exercise    Move It!  Clinical staff conducted group or individual video education with verbal and written material and guidebook.  Patient learns the recommended Pritikin exercise program. Exercise with the goal of living a long, healthy life. Some of the health benefits of exercise include controlled diabetes, healthier blood pressure levels, improved cholesterol levels, improved heart and lung capacity, improved sleep, and better body composition. Everyone should speak with their doctor before starting or changing an exercise routine.  Biomechanical Limitations Clinical staff conducted group or individual video education with verbal and written material and guidebook.  Patient learns how biomechanical limitations can impact exercise and how we can mitigate and possibly overcome limitations to have an impactful and balanced exercise routine.  Body Composition Clinical staff conducted group or individual video education with verbal and written material and guidebook.  Patient learns that body composition (ratio of muscle mass  to fat mass) is a key component to assessing overall fitness, rather than body weight alone. Increased fat mass, especially visceral belly fat, can put us  at increased risk for metabolic syndrome, type 2 diabetes, heart disease, and even death. It is recommended to combine diet and exercise (cardiovascular and resistance training) to improve your body composition. Seek guidance from your physician and exercise physiologist before implementing an exercise routine.  Exercise Action Plan Clinical staff conducted group or individual video education with verbal and written material and guidebook.  Patient learns the recommended strategies to achieve and enjoy long-term exercise adherence, including variety, self-motivation, self-efficacy, and positive decision making. Benefits of exercise include fitness, good health, weight management, more energy, better sleep, less stress, and overall well-being.  Medical   Heart Disease Risk Reduction Clinical staff conducted group or individual video education with verbal and written material and guidebook.  Patient learns our heart is our most vital organ as it circulates oxygen, nutrients, white blood cells, and hormones throughout the entire body, and carries waste away. Data supports a plant-based eating plan like the Pritikin Program for its effectiveness in slowing progression of and reversing heart disease. The video provides a number of recommendations to address heart disease.   Metabolic Syndrome and Belly Fat  Clinical staff conducted group or individual video education with verbal and written material and guidebook.  Patient learns what metabolic syndrome is, how it leads to heart disease, and how one can reverse it and keep it from coming back. You have metabolic syndrome if you have 3 of the following 5 criteria: abdominal obesity, high blood pressure, high triglycerides, low HDL cholesterol, and high blood Kathryn.  Hypertension and Heart  Disease Clinical staff conducted group or individual video education with verbal and written material and guidebook.  Patient learns that high blood pressure, or hypertension, is very common in the [place] . Hypertension is largely due to excessive salt intake, but other important risk factors include being overweight, physical inactivity, drinking too much alcohol, smoking, and not eating enough potassium from fruits and vegetables. High blood pressure is a leading risk factor for heart attack, stroke, congestive heart failure, dementia, kidney failure, and premature death. Long-term effects of excessive salt intake include stiffening of the arteries and thickening of heart muscle and organ damage. Recommendations include  ways to reduce hypertension and the risk of heart disease.  Diseases of Our Time - Focusing on Diabetes Clinical staff conducted group or individual video education with verbal and written material and guidebook.  Patient learns why the best way to stop diseases of our time is prevention, through food and other lifestyle changes. Medicine (such as prescription pills and surgeries) is often only a Band-Aid on the problem, not a long-term solution. Most common diseases of our time include obesity, type 2 diabetes, hypertension, heart disease, and cancer. The Pritikin Program is recommended and has been proven to help reduce, reverse, and/or prevent the damaging effects of metabolic syndrome.  Nutrition   Overview of the Pritikin Eating Plan  Clinical staff conducted group or individual video education with verbal and written material and guidebook.  Patient learns about the Pritikin Eating Plan for disease risk reduction. The Pritikin Eating Plan emphasizes a wide variety of unrefined, minimally-processed carbohydrates, like fruits, vegetables, whole grains, and legumes. Go, Caution, and Stop food choices are explained. Plant-based and lean animal proteins are emphasized. Rationale  provided for low sodium intake for blood pressure control, low added sugars for blood Kathryn stabilization, and low added fats and oils for coronary artery disease risk reduction and weight management.  Calorie Density  Clinical staff conducted group or individual video education with verbal and written material and guidebook.  Patient learns about calorie density and how it impacts the Pritikin Eating Plan. Knowing the characteristics of the food you choose will help you decide whether those foods will lead to weight gain or weight loss, and whether you want to consume more or less of them. Weight loss is usually a side effect of the Pritikin Eating Plan because of its focus on low calorie-dense foods.  Label Reading  Clinical staff conducted group or individual video education with verbal and written material and guidebook.  Patient learns about the Pritikin recommended label reading guidelines and corresponding recommendations regarding calorie density, added sugars, sodium content, and whole grains.  Dining Out - Part 1  Clinical staff conducted group or individual video education with verbal and written material and guidebook.  Patient learns that restaurant meals can be sabotaging because they can be so high in calories, fat, sodium, and/or Kathryn. Patient learns recommended strategies on how to positively address this and avoid unhealthy pitfalls.  Facts on Fats  Clinical staff conducted group or individual video education with verbal and written material and guidebook.  Patient learns that lifestyle modifications can be just as effective, if not more so, as many medications for lowering your risk of heart disease. A Pritikin lifestyle can help to reduce your risk of inflammation and atherosclerosis (cholesterol build-up, or plaque, in the artery walls). Lifestyle interventions such as dietary choices and physical activity address the cause of atherosclerosis. A review of the types of fats and  their impact on blood cholesterol levels, along with dietary recommendations to reduce fat intake is also included.  Nutrition Action Plan  Clinical staff conducted group or individual video education with verbal and written material and guidebook.  Patient learns how to incorporate Pritikin recommendations into their lifestyle. Recommendations include planning and keeping personal health goals in mind as an important part of their success.  Healthy Mind-Set    Healthy Minds, Bodies, Hearts  Clinical staff conducted group or individual video education with verbal and written material and guidebook.  Patient learns how to identify when they are stressed. Video will discuss the impact of that stress,  as well as the many benefits of stress management. Patient will also be introduced to stress management techniques. The way we think, act, and feel has an impact on our hearts.  How Our Thoughts Can Heal Our Hearts  Clinical staff conducted group or individual video education with verbal and written material and guidebook.  Patient learns that negative thoughts can cause depression and anxiety. This can result in negative lifestyle behavior and serious health problems. Cognitive behavioral therapy is an effective method to help control our thoughts in order to change and improve our emotional outlook.  Additional Videos:  Exercise    Improving Performance  Clinical staff conducted group or individual video education with verbal and written material and guidebook.  Patient learns to use a non-linear approach by alternating intensity levels and lengths of time spent exercising to help burn more calories and lose more body fat. Cardiovascular exercise helps improve heart health, metabolism, hormonal balance, blood Kathryn control, and recovery from fatigue. Resistance training improves strength, endurance, balance, coordination, reaction time, metabolism, and muscle mass. Flexibility exercise improves  circulation, posture, and balance. Seek guidance from your physician and exercise physiologist before implementing an exercise routine and learn your capabilities and proper form for all exercise.  Introduction to Yoga  Clinical staff conducted group or individual video education with verbal and written material and guidebook.  Patient learns about yoga, a discipline of the coming together of mind, breath, and body. The benefits of yoga include improved flexibility, improved range of motion, better posture and core strength, increased lung function, weight loss, and positive self-image. Yogas heart health benefits include lowered blood pressure, healthier heart rate, decreased cholesterol and triglyceride levels, improved immune function, and reduced stress. Seek guidance from your physician and exercise physiologist before implementing an exercise routine and learn your capabilities and proper form for all exercise.  Medical   Aging: Enhancing Your Quality of Life  Clinical staff conducted group or individual video education with verbal and written material and guidebook.  Patient learns key strategies and recommendations to stay in good physical health and enhance quality of life, such as prevention strategies, having an advocate, securing a Health Care Proxy and Power of Attorney, and keeping a list of medications and system for tracking them. It also discusses how to avoid risk for bone loss.  Biology of Weight Control  Clinical staff conducted group or individual video education with verbal and written material and guidebook.  Patient learns that weight gain occurs because we consume more calories than we burn (eating more, moving less). Even if your body weight is normal, you may have higher ratios of fat compared to muscle mass. Too much body fat puts you at increased risk for cardiovascular disease, heart attack, stroke, type 2 diabetes, and obesity-related cancers. In addition to exercise,  following the Pritikin Eating Plan can help reduce your risk.  Decoding Lab Results  Clinical staff conducted group or individual video education with verbal and written material and guidebook.  Patient learns that lab test reflects one measurement whose values change over time and are influenced by many factors, including medication, stress, sleep, exercise, food, hydration, pre-existing medical conditions, and more. It is recommended to use the knowledge from this video to become more involved with your lab results and evaluate your numbers to speak with your doctor.   Diseases of Our Time - Overview  Clinical staff conducted group or individual video education with verbal and written material and guidebook.  Patient learns that according  to the CDC, 50% to 70% of chronic diseases (such as obesity, type 2 diabetes, elevated lipids, hypertension, and heart disease) are avoidable through lifestyle improvements including healthier food choices, listening to satiety cues, and increased physical activity.  Sleep Disorders Clinical staff conducted group or individual video education with verbal and written material and guidebook.  Patient learns how good quality and duration of sleep are important to overall health and well-being. Patient also learns about sleep disorders and how they impact health along with recommendations to address them, including discussing with a physician.  Nutrition  Dining Out - Part 2 Clinical staff conducted group or individual video education with verbal and written material and guidebook.  Patient learns how to plan ahead and communicate in order to maximize their dining experience in a healthy and nutritious manner. Included are recommended food choices based on the type of restaurant the patient is visiting.   Fueling a Banker conducted group or individual video education with verbal and written material and guidebook.  There is a strong  connection between our food choices and our health. Diseases like obesity and type 2 diabetes are very prevalent and are in large-part due to lifestyle choices. The Pritikin Eating Plan provides plenty of food and hunger-curbing satisfaction. It is easy to follow, affordable, and helps reduce health risks.  Menu Workshop  Clinical staff conducted group or individual video education with verbal and written material and guidebook.  Patient learns that restaurant meals can sabotage health goals because they are often packed with calories, fat, sodium, and Kathryn. Recommendations include strategies to plan ahead and to communicate with the manager, chef, or server to help order a healthier meal.  Planning Your Eating Strategy  Clinical staff conducted group or individual video education with verbal and written material and guidebook.  Patient learns about the Pritikin Eating Plan and its benefit of reducing the risk of disease. The Pritikin Eating Plan does not focus on calories. Instead, it emphasizes high-quality, nutrient-rich foods. By knowing the characteristics of the foods, we choose, we can determine their calorie density and make informed decisions.  Targeting Your Nutrition Priorities  Clinical staff conducted group or individual video education with verbal and written material and guidebook.  Patient learns that lifestyle habits have a tremendous impact on disease risk and progression. This video provides eating and physical activity recommendations based on your personal health goals, such as reducing LDL cholesterol, losing weight, preventing or controlling type 2 diabetes, and reducing high blood pressure.  Vitamins and Minerals  Clinical staff conducted group or individual video education with verbal and written material and guidebook.  Patient learns different ways to obtain key vitamins and minerals, including through a recommended healthy diet. It is important to discuss all supplements  you take with your doctor.   Healthy Mind-Set    Smoking Cessation  Clinical staff conducted group or individual video education with verbal and written material and guidebook.  Patient learns that cigarette smoking and tobacco addiction pose a serious health risk which affects millions of people. Stopping smoking will significantly reduce the risk of heart disease, lung disease, and many forms of cancer. Recommended strategies for quitting are covered, including working with your doctor to develop a successful plan.  Culinary   Becoming a Set Designer conducted group or individual video education with verbal and written material and guidebook.  Patient learns that cooking at home can be healthy, cost-effective, quick, and puts them in control.  Keys to cooking healthy recipes will include looking at your recipe, assessing your equipment needs, planning ahead, making it simple, choosing cost-effective seasonal ingredients, and limiting the use of added fats, salts, and sugars.  Cooking - Breakfast and Snacks  Clinical staff conducted group or individual video education with verbal and written material and guidebook.  Patient learns how important breakfast is to satiety and nutrition through the entire day. Recommendations include key foods to eat during breakfast to help stabilize blood Kathryn levels and to prevent overeating at meals later in the day. Planning ahead is also a key component.  Cooking - Educational Psychologist conducted group or individual video education with verbal and written material and guidebook.  Patient learns eating strategies to improve overall health, including an approach to cook more at home. Recommendations include thinking of animal protein as a side on your plate rather than center stage and focusing instead on lower calorie dense options like vegetables, fruits, whole grains, and plant-based proteins, such as beans. Making sauces in large  quantities to freeze for later and leaving the skin on your vegetables are also recommended to maximize your experience.  Cooking - Healthy Salads and Dressing Clinical staff conducted group or individual video education with verbal and written material and guidebook.  Patient learns that vegetables, fruits, whole grains, and legumes are the foundations of the Pritikin Eating Plan. Recommendations include how to incorporate each of these in flavorful and healthy salads, and how to create homemade salad dressings. Proper handling of ingredients is also covered. Cooking - Soups and [employer] - Soups and Desserts Clinical staff conducted group or individual video education with verbal and written material and guidebook.  Patient learns that Pritikin soups and desserts make for easy, nutritious, and delicious snacks and meal components that are low in sodium, fat, Kathryn, and calorie density, while high in vitamins, minerals, and filling fiber. Recommendations include simple and healthy ideas for soups and desserts.   Overview     The Pritikin Solution Program Overview Clinical staff conducted group or individual video education with verbal and written material and guidebook.  Patient learns that the results of the Pritikin Program have been documented in more than 100 articles published in peer-reviewed journals, and the benefits include reducing risk factors for (and, in some cases, even reversing) high cholesterol, high blood pressure, type 2 diabetes, obesity, and more! An overview of the three key pillars of the Pritikin Program will be covered: eating well, doing regular exercise, and having a healthy mind-set.  WORKSHOPS  Exercise: Exercise Basics: Building Your Action Plan Clinical staff led group instruction and group discussion with PowerPoint presentation and patient guidebook. To enhance the learning environment the use of posters, models and videos may be added. At the conclusion of  this workshop, patients will comprehend the difference between physical activity and exercise, as well as the benefits of incorporating both, into their routine. Patients will understand the FITT (Frequency, Intensity, Time, and Type) principle and how to use it to build an exercise action plan. In addition, safety concerns and other considerations for exercise and cardiac rehab will be addressed by the presenter. The purpose of this lesson is to promote a comprehensive and effective weekly exercise routine in order to improve patients overall level of fitness.   Managing Heart Disease: Your Path to a Healthier Heart Clinical staff led group instruction and group discussion with PowerPoint presentation and patient guidebook. To enhance the learning environment the use  of posters, models and videos may be added.At the conclusion of this workshop, patients will understand the anatomy and physiology of the heart. Additionally, they will understand how Pritikins three pillars impact the risk factors, the progression, and the management of heart disease.  The purpose of this lesson is to provide a high-level overview of the heart, heart disease, and how the Pritikin lifestyle positively impacts risk factors.  Exercise Biomechanics Clinical staff led group instruction and group discussion with PowerPoint presentation and patient guidebook. To enhance the learning environment the use of posters, models and videos may be added. Patients will learn how the structural parts of their bodies function and how these functions impact their daily activities, movement, and exercise. Patients will learn how to promote a neutral spine, learn how to manage pain, and identify ways to improve their physical movement in order to promote healthy living. The purpose of this lesson is to expose patients to common physical limitations that impact physical activity. Participants will learn practical ways to adapt and  manage aches and pains, and to minimize their effect on regular exercise. Patients will learn how to maintain good posture while sitting, walking, and lifting.  Balance Training and Fall Prevention  Clinical staff led group instruction and group discussion with PowerPoint presentation and patient guidebook. To enhance the learning environment the use of posters, models and videos may be added. At the conclusion of this workshop, patients will understand the importance of their sensorimotor skills (vision, proprioception, and the vestibular system) in maintaining their ability to balance as they age. Patients will apply a variety of balancing exercises that are appropriate for their current level of function. Patients will understand the common causes for poor balance, possible solutions to these problems, and ways to modify their physical environment in order to minimize their fall risk. The purpose of this lesson is to teach patients about the importance of maintaining balance as they age and ways to minimize their risk of falling.  WORKSHOPS   Nutrition:  Fueling a Ship Broker led group instruction and group discussion with PowerPoint presentation and patient guidebook. To enhance the learning environment the use of posters, models and videos may be added. Patients will review the foundational principles of the Pritikin Eating Plan and understand what constitutes a serving size in each of the food groups. Patients will also learn Pritikin-friendly foods that are better choices when away from home and review make-ahead meal and snack options. Calorie density will be reviewed and applied to three nutrition priorities: weight maintenance, weight loss, and weight gain. The purpose of this lesson is to reinforce (in a group setting) the key concepts around what patients are recommended to eat and how to apply these guidelines when away from home by planning and selecting Pritikin-friendly  options. Patients will understand how calorie density may be adjusted for different weight management goals.  Mindful Eating  Clinical staff led group instruction and group discussion with PowerPoint presentation and patient guidebook. To enhance the learning environment the use of posters, models and videos may be added. Patients will briefly review the concepts of the Pritikin Eating Plan and the importance of low-calorie dense foods. The concept of mindful eating will be introduced as well as the importance of paying attention to internal hunger signals. Triggers for non-hunger eating and techniques for dealing with triggers will be explored. The purpose of this lesson is to provide patients with the opportunity to review the basic principles of the Pritikin Eating Plan,  discuss the value of eating mindfully and how to measure internal cues of hunger and fullness using the Hunger Scale. Patients will also discuss reasons for non-hunger eating and learn strategies to use for controlling emotional eating.  Targeting Your Nutrition Priorities Clinical staff led group instruction and group discussion with PowerPoint presentation and patient guidebook. To enhance the learning environment the use of posters, models and videos may be added. Patients will learn how to determine their genetic susceptibility to disease by reviewing their family history. Patients will gain insight into the importance of diet as part of an overall healthy lifestyle in mitigating the impact of genetics and other environmental insults. The purpose of this lesson is to provide patients with the opportunity to assess their personal nutrition priorities by looking at their family history, their own health history and current risk factors. Patients will also be able to discuss ways of prioritizing and modifying the Pritikin Eating Plan for their highest risk areas  Menu  Clinical staff led group instruction and group discussion with  PowerPoint presentation and patient guidebook. To enhance the learning environment the use of posters, models and videos may be added. Using menus brought in from [employer], or printed from [employer], patients will apply the Pritikin dining out guidelines that were presented in the [employer] video. Patients will also be able to practice these guidelines in a variety of provided scenarios. The purpose of this lesson is to provide patients with the opportunity to practice hands-on learning of the Pritikin Dining Out guidelines with actual menus and practice scenarios.  Label Reading Clinical staff led group instruction and group discussion with PowerPoint presentation and patient guidebook. To enhance the learning environment the use of posters, models and videos may be added. Patients will review and discuss the Pritikin label reading guidelines presented in Pritikins Label Reading Educational series video. Using fool labels brought in from local grocery stores and markets, patients will apply the label reading guidelines and determine if the packaged food meet the Pritikin guidelines. The purpose of this lesson is to provide patients with the opportunity to review, discuss, and practice hands-on learning of the Pritikin Label Reading guidelines with actual packaged food labels. Cooking School  Pritikins [employer] are designed to teach patients ways to prepare quick, simple, and affordable recipes at home. The importance of nutritions role in chronic disease risk reduction is reflected in its emphasis in the overall Pritikin program. By learning how to prepare essential core Pritikin Eating Plan recipes, patients will increase control over what they eat; be able to customize the flavor of foods without the use of added salt, Kathryn, or fat; and improve the quality of the food they consume. By learning a set of core recipes which are easily assembled, quickly  prepared, and affordable, patients are more likely to prepare more healthy foods at home. These workshops focus on convenient breakfasts, simple entres, side dishes, and desserts which can be prepared with minimal effort and are consistent with nutrition recommendations for cardiovascular risk reduction. Cooking [employer] are taught by a armed forces logistics/support/administrative officer (RD) who has been trained by the [employer]. The chef or RD has a clear understanding of the importance of minimizing - if not completely eliminating - added fat, Kathryn, and sodium in recipes. Throughout the series of Cooking School Workshop sessions, patients will learn about healthy ingredients and efficient methods of cooking to build confidence in their capability to prepare  Cooking School weekly topics:  Adding Flavor- Sodium-Free  Fast and Healthy Breakfasts  Powerhouse Plant-Based Proteins  Satisfying Salads and Dressings  Simple Sides and Sauces  International Cuisine-Spotlight on the [employer] Zones  Delicious Desserts  Savory Soups  Efficiency Cooking - Meals in a Snap  Tasty Appetizers and Snacks  Comforting Weekend Breakfasts  One-Pot Wonders   Fast Evening Meals  Landscape Architect Your Pritikin Plate  WORKSHOPS   Healthy Mindset (Psychosocial):  Focused Goals, Sustainable Changes Clinical staff led group instruction and group discussion with PowerPoint presentation and patient guidebook. To enhance the learning environment the use of posters, models and videos may be added. Patients will be able to apply effective goal setting strategies to establish at least one personal goal, and then take consistent, meaningful action toward that goal. They will learn to identify common barriers to achieving personal goals and develop strategies to overcome them. Patients will also gain an understanding of how our mind-set can impact our ability to achieve goals and the importance of  cultivating a positive and growth-oriented mind-set. The purpose of this lesson is to provide patients with a deeper understanding of how to set and achieve personal goals, as well as the tools and strategies needed to overcome common obstacles which may arise along the way.  From Head to Heart: The Power of a Healthy Outlook  Clinical staff led group instruction and group discussion with PowerPoint presentation and patient guidebook. To enhance the learning environment the use of posters, models and videos may be added. Patients will be able to recognize and describe the impact of emotions and mood on physical health. They will discover the importance of self-care and explore self-care practices which may work for them. Patients will also learn how to utilize the 4 Cs to cultivate a healthier outlook and better manage stress and challenges. The purpose of this lesson is to demonstrate to patients how a healthy outlook is an essential part of maintaining good health, especially as they continue their cardiac rehab journey.  Healthy Sleep for a Healthy Heart Clinical staff led group instruction and group discussion with PowerPoint presentation and patient guidebook. To enhance the learning environment the use of posters, models and videos may be added. At the conclusion of this workshop, patients will be able to demonstrate knowledge of the importance of sleep to overall health, well-being, and quality of life. They will understand the symptoms of, and treatments for, common sleep disorders. Patients will also be able to identify daytime and nighttime behaviors which impact sleep, and they will be able to apply these tools to help manage sleep-related challenges. The purpose of this lesson is to provide patients with a general overview of sleep and outline the importance of quality sleep. Patients will learn about a few of the most common sleep disorders. Patients will also be introduced to the concept of  sleep hygiene, and discover ways to self-manage certain sleeping problems through simple daily behavior changes. Finally, the workshop will motivate patients by clarifying the links between quality sleep and their goals of heart-healthy living.   Recognizing and Reducing Stress Clinical staff led group instruction and group discussion with PowerPoint presentation and patient guidebook. To enhance the learning environment the use of posters, models and videos may be added. At the conclusion of this workshop, patients will be able to understand the types of stress reactions, differentiate between acute and chronic stress, and recognize the impact that chronic stress has on their health. They will  also be able to apply different coping mechanisms, such as reframing negative self-talk. Patients will have the opportunity to practice a variety of stress management techniques, such as deep abdominal breathing, progressive muscle relaxation, and/or guided imagery.  The purpose of this lesson is to educate patients on the role of stress in their lives and to provide healthy techniques for coping with it.  Learning Barriers/Preferences:  Learning Barriers/Preferences - 05/02/24 1242       Learning Barriers/Preferences   Learning Barriers None    Learning Preferences Pictoral          Education Topics:  Knowledge Questionnaire Score:  Knowledge Questionnaire Score - 05/02/24 1059       Knowledge Questionnaire Score   Pre Score 21/24          Core Components/Risk Factors/Patient Goals at Admission:  Personal Goals and Risk Factors at Admission - 05/02/24 1028       Core Components/Risk Factors/Patient Goals on Admission    Weight Management Yes;Weight Maintenance    Intervention Weight Management: Develop a combined nutrition and exercise program designed to reach desired caloric intake, while maintaining appropriate intake of nutrient and fiber, sodium and fats, and appropriate energy  expenditure required for the weight goal.;Weight Management: Provide education and appropriate resources to help participant work on and attain dietary goals.    Expected Outcomes Short Term: Continue to assess and modify interventions until short term weight is achieved;Long Term: Adherence to nutrition and physical activity/exercise program aimed toward attainment of established weight goal;Weight Maintenance: Understanding of the daily nutrition guidelines, which includes 25-35% calories from fat, 7% or less cal from saturated fats, less than 200mg  cholesterol, less than 1.5gm of sodium, & 5 or more servings of fruits and vegetables daily;Understanding recommendations for meals to include 15-35% energy as protein, 25-35% energy from fat, 35-60% energy from carbohydrates, less than 200mg  of dietary cholesterol, 20-35 gm of total fiber daily    Hypertension Yes    Intervention Provide education on lifestyle modifcations including regular physical activity/exercise, weight management, moderate sodium restriction and increased consumption of fresh fruit, vegetables, and low fat dairy, alcohol moderation, and smoking cessation.;Monitor prescription use compliance.    Expected Outcomes Short Term: Continued assessment and intervention until BP is < 140/35mm HG in hypertensive participants. < 130/77mm HG in hypertensive participants with diabetes, heart failure or chronic kidney disease.;Long Term: Maintenance of blood pressure at goal levels.    Lipids Yes    Intervention Provide education and support for participant on nutrition & aerobic/resistive exercise along with prescribed medications to achieve LDL 70mg , HDL >40mg .    Expected Outcomes Short Term: Participant states understanding of desired cholesterol values and is compliant with medications prescribed. Participant is following exercise prescription and nutrition guidelines.;Long Term: Cholesterol controlled with medications as prescribed, with  individualized exercise RX and with personalized nutrition plan. Value goals: LDL < 70mg , HDL > 40 mg.          Core Components/Risk Factors/Patient Goals Review:   Goals and Risk Factor Review     Row Name 05/08/24 1705 05/10/24 1649           Core Components/Risk Factors/Patient Goals Review   Personal Goals Review Weight Management/Obesity;Hypertension;Lipids Weight Management/Obesity;Hypertension;Lipids      Review Lowell started cardiac rehab on 05/08/24. Aniesha did well with exercise. Vital signs were stable. Zeyna started cardiac rehab on 05/08/24. Karyssa is off to a good start with exercise. Vital signs have been stable.      Expected  Outcomes Chantee will continue to participate in cardiac rehab for exercise, nutrition and lifestyle modifcations. Anitha will continue to participate in cardiac rehab for exercise, nutrition and lifestyle modifcations.         Core Components/Risk Factors/Patient Goals at Discharge (Final Review):   Goals and Risk Factor Review - 05/10/24 1649       Core Components/Risk Factors/Patient Goals Review   Personal Goals Review Weight Management/Obesity;Hypertension;Lipids    Review Saher started cardiac rehab on 05/08/24. Aleya is off to a good start with exercise. Vital signs have been stable.    Expected Outcomes Triana will continue to participate in cardiac rehab for exercise, nutrition and lifestyle modifcations.          ITP Comments:  ITP Comments     Row Name 05/02/24 1021 05/10/24 1648         ITP Comments Medical Director- Dr. Wilbert Bihari, MD. Introduction to the [facility] Program. Reviewed initial orientation folder with Ada. 30 Day ITP Review. Mychal started cardiac rehab on 05/10/24. Jahne is off to a good start to exercise.         Comments: See ITP comments.Hadassah Elpidio Quan RN BSN     [1]  Current Outpatient Medications:    ALPRAZolam  (XANAX ) 0.5 MG tablet, TAKE 1/2 - 1  TABLET ONCE A DAY FOR ANXIETY, SLEEP (LIMIT TO LESS THAN 5 DAYS A WEEK), Disp: 25 tablet, Rfl: 0   aspirin  EC 81 MG tablet, Take 81 mg by mouth daily. Swallow whole., Disp: , Rfl:    azithromycin  (ZITHROMAX ) 500 MG tablet, Take 1 tablet (500 mg total) by mouth as directed. Take one tablet 1 hour before any dental work including cleanings., Disp: 6 tablet, Rfl: 12   buPROPion  (WELLBUTRIN  XL) 300 MG 24 hr tablet, TAKE ONE TABLET BY MOUTH EVERY MORNING FOR MOOD, ANXIETY, FOCUS AND CONCENTRATION, Disp: 90 tablet, Rfl: 3   Cholecalciferol (VITAMIN D ) 125 MCG (5000 UT) CAPS, Take 5,000 Units by mouth daily., Disp: , Rfl:    cyanocobalamin  (VITAMIN B12) 500 MCG tablet, Take 500 mcg by mouth daily., Disp: , Rfl:    gabapentin  (NEURONTIN ) 100 MG capsule, TAKE ONE CAPSULE THREE TIMES DAILY., Disp: 270 capsule, Rfl: 3   ipratropium (ATROVENT ) 0.03 % nasal spray, Place 2 sprays into the nose 3 (three) times daily., Disp: 30 mL, Rfl: 2   rosuvastatin  (CRESTOR ) 5 MG tablet, TAKE ONE TABLET ONCE DAILY FOR CHOLESTEROL, Disp: 90 tablet, Rfl: 1 [2]  Social History Tobacco Use  Smoking Status Never  Smokeless Tobacco Never

## 2024-05-15 ENCOUNTER — Encounter (HOSPITAL_COMMUNITY)

## 2024-05-16 ENCOUNTER — Ambulatory Visit: Admitting: Cardiology

## 2024-05-16 ENCOUNTER — Encounter: Payer: Self-pay | Admitting: Cardiology

## 2024-05-16 ENCOUNTER — Ambulatory Visit: Attending: Cardiology | Admitting: Cardiology

## 2024-05-16 VITALS — BP 114/76 | HR 70 | Ht 66.0 in | Wt 127.5 lb

## 2024-05-16 DIAGNOSIS — Z952 Presence of prosthetic heart valve: Secondary | ICD-10-CM

## 2024-05-16 DIAGNOSIS — I10 Essential (primary) hypertension: Secondary | ICD-10-CM | POA: Diagnosis not present

## 2024-05-16 DIAGNOSIS — I447 Left bundle-branch block, unspecified: Secondary | ICD-10-CM | POA: Diagnosis not present

## 2024-05-16 NOTE — Progress Notes (Signed)
 " Electrophysiology Office Note:   Date:  05/16/2024  ID:  Kathryn Chavez, DOB 08-Dec-1950, MRN [id number]  Primary Cardiologist: Ozell Fell, MD Primary Heart Failure: None Electrophysiologist: None      History of Present Illness:   Kathryn Chavez is a 74 y.o. female with h/o hypertension, hyperlipidemia, incomplete right bundle branch block, breast cancer postchemotherapy and radiation, severe aortic stenosis post TAVR 03/21/2024 seen today for  for Electrophysiology evaluation of heart block at the request of Maude Emmer.    She had successful TAVR December 2025.  She developed new left bundle branch block post TAVR.  Zio monitor showed Mobitz 2 AV block.  Discussed the use of AI scribe software for clinical note transcription with the patient, who gave verbal consent to proceed.  History of Present Illness Kathryn Chavez is a 74 year old female who presents for evaluation of left bundle branch block. She was referred by Dr. Fell for evaluation of her heart rhythm following aortic valve replacement.  She underwent aortic valve replacement in December, which significantly improved her breathing and reduced fatigue. She is actively participating in physical therapy and cardiac rehabilitation.  Following the valve replacement, she developed a left bundle branch block. Initial EKGs showed a QRS duration of 136 milliseconds. Subsequent EKGs showed the QRS duration decreased to 86 milliseconds.  No episodes of syncope or lightheadedness since the valve replacement.  She is undergoing rehab without issue.    Review of systems complete and found to be negative unless listed in HPI.   EP Information / Studies Reviewed:    EKG is ordered today. Personal review as below.  EKG Interpretation Date/Time:  Tuesday May 16 2024 10:44:10 EST Ventricular Rate:  70 PR Interval:  178 QRS Duration:  82 QT Interval:  400 QTC Calculation: 432 R Axis:   21  Text Interpretation: Normal sinus  rhythm When compared with ECG of 31-Mar-2024 11:25, Fusion complexes are no longer Present PR interval has decreased Nonspecific T wave abnormality no longer evident in Lateral leads Confirmed by Danyon Mcginness ([id number]) on 05/16/2024 11:07:22 AM    Physical Exam:   VS:  BP 114/76 (BP Location: Left Arm, Patient Position: Sitting, Cuff Size: Normal)   Pulse 70   Ht 5' 6 (1.676 m)   Wt 127 lb 8 oz (57.8 kg)   SpO2 96%   BMI 20.58 kg/m    Wt Readings from Last 3 Encounters:  05/16/24 127 lb 8 oz (57.8 kg)  05/04/24 128 lb (58.1 kg)  05/02/24 127 lb 3.3 oz (57.7 kg)     GEN: Well nourished, well developed in no acute distress NECK: No JVD; No carotid bruits CARDIAC: Regular rate and rhythm, no murmurs, rubs, gallops RESPIRATORY:  Clear to auscultation without rales, wheezing or rhonchi  ABDOMEN: Soft, non-tender, non-distended EXTREMITIES:  No edema; No deformity   Risk Assessment/Calculations:      ASSESSMENT AND PLAN:    1.  Aortic stenosis: Patient is post TAVR 03/21/2024.  Plan per primary team.  2.  Left bundle branch block with Mobitz 1 AV block: Patient wore a cardiac monitor that showed intermittent dropped heartbeats.  These appear to be single beats.  She had a left bundle branch block immediately post TAVR implant, but her left bundle branch block has improved.  I do not see any evidence of other advanced AV block other than single dropped beats.  Additionally, she has not had any syncope or presyncope.  For now, I think  that this can be monitored.  We did discuss the possibility of pacemaker implant if necessary.  3.  Hypertension: Well-controlled  Follow up with EP Team PRN   Signed, Shadrack Brummitt Gladis Norton, MD  "

## 2024-05-17 ENCOUNTER — Encounter (HOSPITAL_COMMUNITY)
Admission: RE | Admit: 2024-05-17 | Discharge: 2024-05-17 | Disposition: A | Source: Ambulatory Visit | Attending: Cardiovascular Disease

## 2024-05-17 DIAGNOSIS — Z952 Presence of prosthetic heart valve: Secondary | ICD-10-CM

## 2024-05-17 DIAGNOSIS — Z48812 Encounter for surgical aftercare following surgery on the circulatory system: Secondary | ICD-10-CM | POA: Diagnosis not present

## 2024-05-18 ENCOUNTER — Ambulatory Visit (INDEPENDENT_AMBULATORY_CARE_PROVIDER_SITE_OTHER): Admitting: Audiology

## 2024-05-18 DIAGNOSIS — H903 Sensorineural hearing loss, bilateral: Secondary | ICD-10-CM

## 2024-05-18 NOTE — Progress Notes (Signed)
" °  [address] [place], [place] [address] [phone]  Audiological Evaluation    Name: Kathryn Chavez     DOB:   08/06/1950      MRN:   [id number]                                                                                     Service Date: 05/18/2024     Accompanied by: self    Patient comes today after Dr. Karis, ENT sent a referral for a hearing evaluation due to concerns with tinnitus.   Symptoms Yes Details  Hearing loss  [x]  Difficulty understanding others- both ears  Tinnitus  [x]  Both ears, buzzing  Ear pain/ infections/pressure  []    Balance problems  []    Noise exposure history  []    Previous ear surgeries  []    Family history of hearing loss  [x]  Mother reportedly in her 23's  Amplification  []    Other  []      Otoscopy: Right ear: clear external ear canal and notable landmarks visualized on the tympanic membrane. Left ear:  clear external ear canal and notable landmarks visualized on the tympanic membrane.  Tympanometry: Right ear: Type A - Normal external ear canal volume with normal middle ear pressure and normal tympanic membrane compliance. Findings are consistent with normal middle ear function. Left ear: Type A - Normal external ear canal volume with normal middle ear pressure and normal tympanic membrane compliance. Findings are consistent with normal middle ear function.  Hearing Evaluation The hearing test results were completed under headphones and re-checked with inserts and results are deemed to be of good reliability. Test technique:  conventional    Pure tone Audiometry: Right ear- Normal to moderate sensorineural hearing loss from 125 Hz - 2000 Hz, then mild rising to from borderline normal sensorineural hearing loss from 4000-8000 Hz. Left ear- Normal to moderate sensorineural hearing loss from 125 Hz - 8000 Hz.  Speech Audiometry: Right ear- Speech Reception Threshold (SRT) was obtained at 30 dBHL. Left ear-Speech Reception  Threshold (SRT) was obtained at 30 dBHL.   Word Recognition Score Tested using NU-6 (recorded) Right ear: 72% was obtained at a presentation level of 75 dBHL with contralateral masking which is deemed as  fair. Left ear: 76% was obtained at a presentation level of 75 dBHL with contralateral masking which is deemed as  fair.   Impression: There is not a significant difference in pure-tone thresholds between ears. There is not a significant difference in the word recognition score in between ears.    Recommendations: Follow up with ENT as scheduled. Return for a hearing evaluation if concerns with hearing changes arise or per MD recommendation. Consider various tinnitus strategies, including the use of a sound generator, hearing aids, and/or tinnitus retraining therapy.  Consider a communication needs assessment for amplification after medical clearance is obtained, if needed.   Kathryn Chavez, AUD  "

## 2024-05-22 ENCOUNTER — Encounter (HOSPITAL_COMMUNITY)

## 2024-05-24 ENCOUNTER — Encounter (HOSPITAL_COMMUNITY)
Admission: RE | Admit: 2024-05-24 | Discharge: 2024-05-24 | Disposition: A | Source: Ambulatory Visit | Attending: Cardiovascular Disease

## 2024-05-24 DIAGNOSIS — Z952 Presence of prosthetic heart valve: Secondary | ICD-10-CM

## 2024-05-29 ENCOUNTER — Encounter (HOSPITAL_COMMUNITY)

## 2024-05-31 ENCOUNTER — Encounter (HOSPITAL_COMMUNITY)

## 2024-06-05 ENCOUNTER — Encounter (HOSPITAL_COMMUNITY)

## 2024-06-07 ENCOUNTER — Encounter (HOSPITAL_COMMUNITY)

## 2024-06-12 ENCOUNTER — Encounter (HOSPITAL_COMMUNITY)

## 2024-06-14 ENCOUNTER — Encounter (HOSPITAL_COMMUNITY)

## 2024-06-19 ENCOUNTER — Encounter (HOSPITAL_COMMUNITY)

## 2024-06-21 ENCOUNTER — Encounter (HOSPITAL_COMMUNITY)

## 2024-06-26 ENCOUNTER — Encounter (HOSPITAL_COMMUNITY)

## 2024-06-28 ENCOUNTER — Encounter (HOSPITAL_COMMUNITY)

## 2024-07-03 ENCOUNTER — Encounter (HOSPITAL_COMMUNITY)

## 2024-07-05 ENCOUNTER — Encounter (HOSPITAL_COMMUNITY)

## 2024-07-10 ENCOUNTER — Encounter (HOSPITAL_COMMUNITY)

## 2024-07-12 ENCOUNTER — Encounter (HOSPITAL_COMMUNITY)

## 2024-07-17 ENCOUNTER — Encounter (HOSPITAL_COMMUNITY)

## 2024-07-19 ENCOUNTER — Encounter (HOSPITAL_COMMUNITY)

## 2024-07-24 ENCOUNTER — Encounter (HOSPITAL_COMMUNITY)

## 2024-07-26 ENCOUNTER — Encounter (HOSPITAL_COMMUNITY)

## 2024-09-07 ENCOUNTER — Ambulatory Visit: Admitting: Oncology

## 2024-09-07 ENCOUNTER — Other Ambulatory Visit

## 2025-03-19 ENCOUNTER — Ambulatory Visit: Admitting: Physician Assistant

## 2025-03-19 ENCOUNTER — Ambulatory Visit (HOSPITAL_COMMUNITY)
